# Patient Record
Sex: Male | Born: 1950 | Race: White | Hispanic: No | Marital: Single | Smoking: Former smoker
Health system: Southern US, Community
[De-identification: ages and names within clinical notes are randomized; demographics above are authoritative.]

## PROBLEM LIST (undated history)

## (undated) DIAGNOSIS — J90 Pleural effusion, not elsewhere classified: Secondary | ICD-10-CM

## (undated) DIAGNOSIS — I739 Peripheral vascular disease, unspecified: Secondary | ICD-10-CM

## (undated) DIAGNOSIS — M4802 Spinal stenosis, cervical region: Secondary | ICD-10-CM

## (undated) DIAGNOSIS — E1122 Type 2 diabetes mellitus with diabetic chronic kidney disease: Secondary | ICD-10-CM

## (undated) DIAGNOSIS — Z89519 Acquired absence of unspecified leg below knee: Secondary | ICD-10-CM

## (undated) DIAGNOSIS — I255 Ischemic cardiomyopathy: Secondary | ICD-10-CM

## (undated) DIAGNOSIS — I219 Acute myocardial infarction, unspecified: Secondary | ICD-10-CM

## (undated) DIAGNOSIS — I70209 Unspecified atherosclerosis of native arteries of extremities, unspecified extremity: Secondary | ICD-10-CM

## (undated) DIAGNOSIS — I2510 Atherosclerotic heart disease of native coronary artery without angina pectoris: Secondary | ICD-10-CM

## (undated) DIAGNOSIS — N183 Chronic kidney disease, stage 3 (moderate): Secondary | ICD-10-CM

## (undated) DIAGNOSIS — R011 Cardiac murmur, unspecified: Secondary | ICD-10-CM

## (undated) DIAGNOSIS — I509 Heart failure, unspecified: Secondary | ICD-10-CM

## (undated) DIAGNOSIS — C189 Malignant neoplasm of colon, unspecified: Secondary | ICD-10-CM

## (undated) DIAGNOSIS — E1142 Type 2 diabetes mellitus with diabetic polyneuropathy: Secondary | ICD-10-CM

## (undated) DIAGNOSIS — E1159 Type 2 diabetes mellitus with other circulatory complications: Secondary | ICD-10-CM

## (undated) DIAGNOSIS — Z85038 Personal history of other malignant neoplasm of large intestine: Secondary | ICD-10-CM

## (undated) DIAGNOSIS — I639 Cerebral infarction, unspecified: Secondary | ICD-10-CM

## (undated) DIAGNOSIS — N200 Calculus of kidney: Secondary | ICD-10-CM

## (undated) HISTORY — DX: Peripheral vascular disease, unspecified: I73.9

## (undated) HISTORY — DX: Calculus of kidney: N20.0

## (undated) HISTORY — PX: PORT-A-CATH REMOVAL: SHX5289

---

## 2003-09-02 ENCOUNTER — Emergency Department (HOSPITAL_COMMUNITY): Admission: EM | Admit: 2003-09-02 | Discharge: 2003-09-02 | Payer: Self-pay | Admitting: Emergency Medicine

## 2003-09-04 ENCOUNTER — Encounter (HOSPITAL_BASED_OUTPATIENT_CLINIC_OR_DEPARTMENT_OTHER): Admission: RE | Admit: 2003-09-04 | Discharge: 2003-10-22 | Payer: Self-pay | Admitting: Internal Medicine

## 2004-12-25 ENCOUNTER — Encounter (HOSPITAL_BASED_OUTPATIENT_CLINIC_OR_DEPARTMENT_OTHER): Admission: RE | Admit: 2004-12-25 | Discharge: 2005-03-25 | Payer: Self-pay | Admitting: Surgery

## 2008-06-27 ENCOUNTER — Inpatient Hospital Stay (HOSPITAL_COMMUNITY): Admission: EM | Admit: 2008-06-27 | Discharge: 2008-07-05 | Payer: Self-pay | Admitting: Emergency Medicine

## 2008-06-27 ENCOUNTER — Encounter (INDEPENDENT_AMBULATORY_CARE_PROVIDER_SITE_OTHER): Payer: Self-pay | Admitting: *Deleted

## 2008-06-27 ENCOUNTER — Encounter (INDEPENDENT_AMBULATORY_CARE_PROVIDER_SITE_OTHER): Payer: Self-pay | Admitting: General Surgery

## 2008-06-27 HISTORY — PX: SUBTOTAL COLECTOMY: SHX855

## 2008-07-03 ENCOUNTER — Ambulatory Visit: Payer: Self-pay | Admitting: Hematology and Oncology

## 2008-07-24 LAB — CBC WITH DIFFERENTIAL/PLATELET
BASO%: 0.8 % (ref 0.0–2.0)
LYMPH%: 13.9 % — ABNORMAL LOW (ref 14.0–48.0)
MCHC: 34.6 g/dL (ref 32.0–35.9)
MCV: 82.4 fL (ref 81.6–98.0)
MONO%: 7.9 % (ref 0.0–13.0)
Platelets: 265 10*3/uL (ref 145–400)
RBC: 4.98 10*6/uL (ref 4.20–5.71)

## 2008-07-24 LAB — COMPREHENSIVE METABOLIC PANEL
Albumin: 4.5 g/dL (ref 3.5–5.2)
Alkaline Phosphatase: 129 U/L — ABNORMAL HIGH (ref 39–117)
BUN: 37 mg/dL — ABNORMAL HIGH (ref 6–23)
CO2: 20 mEq/L (ref 19–32)
Calcium: 9.7 mg/dL (ref 8.4–10.5)
Glucose, Bld: 170 mg/dL — ABNORMAL HIGH (ref 70–99)
Potassium: 4.7 mEq/L (ref 3.5–5.3)
Sodium: 134 mEq/L — ABNORMAL LOW (ref 135–145)
Total Protein: 7.8 g/dL (ref 6.0–8.3)

## 2008-07-29 ENCOUNTER — Ambulatory Visit (HOSPITAL_COMMUNITY): Admission: RE | Admit: 2008-07-29 | Discharge: 2008-07-29 | Payer: Self-pay | Admitting: Hematology and Oncology

## 2008-07-29 ENCOUNTER — Encounter (INDEPENDENT_AMBULATORY_CARE_PROVIDER_SITE_OTHER): Payer: Self-pay | Admitting: *Deleted

## 2008-08-08 ENCOUNTER — Ambulatory Visit (HOSPITAL_BASED_OUTPATIENT_CLINIC_OR_DEPARTMENT_OTHER): Admission: RE | Admit: 2008-08-08 | Discharge: 2008-08-08 | Payer: Self-pay | Admitting: General Surgery

## 2008-08-08 ENCOUNTER — Encounter (INDEPENDENT_AMBULATORY_CARE_PROVIDER_SITE_OTHER): Payer: Self-pay | Admitting: *Deleted

## 2008-08-08 HISTORY — PX: PORTACATH PLACEMENT: SHX2246

## 2008-08-13 LAB — CBC WITH DIFFERENTIAL/PLATELET
BASO%: 0.3 % (ref 0.0–2.0)
LYMPH%: 18.7 % (ref 14.0–48.0)
MCHC: 34.5 g/dL (ref 32.0–35.9)
MONO#: 0.5 10*3/uL (ref 0.1–0.9)
MONO%: 4.4 % (ref 0.0–13.0)
Platelets: 282 10*3/uL (ref 145–400)
RBC: 4.64 10*6/uL (ref 4.20–5.71)
WBC: 11.1 10*3/uL — ABNORMAL HIGH (ref 4.0–10.0)

## 2008-08-13 LAB — COMPREHENSIVE METABOLIC PANEL
ALT: 28 U/L (ref 0–53)
Alkaline Phosphatase: 122 U/L — ABNORMAL HIGH (ref 39–117)
Sodium: 139 mEq/L (ref 135–145)
Total Bilirubin: 0.4 mg/dL (ref 0.3–1.2)
Total Protein: 7.3 g/dL (ref 6.0–8.3)

## 2008-08-23 ENCOUNTER — Ambulatory Visit: Payer: Self-pay | Admitting: Hematology and Oncology

## 2008-08-27 LAB — COMPREHENSIVE METABOLIC PANEL
ALT: 20 U/L (ref 0–53)
AST: 14 U/L (ref 0–37)
Alkaline Phosphatase: 167 U/L — ABNORMAL HIGH (ref 39–117)
Creatinine, Ser: 1.32 mg/dL (ref 0.40–1.50)
Sodium: 136 mEq/L (ref 135–145)
Total Bilirubin: 0.3 mg/dL (ref 0.3–1.2)
Total Protein: 7.5 g/dL (ref 6.0–8.3)

## 2008-08-27 LAB — CBC WITH DIFFERENTIAL/PLATELET
BASO%: 0.2 % (ref 0.0–2.0)
EOS%: 0.5 % (ref 0.0–7.0)
LYMPH%: 12.2 % — ABNORMAL LOW (ref 14.0–49.0)
MCH: 28.6 pg (ref 27.2–33.4)
MCHC: 34.5 g/dL (ref 32.0–36.0)
MCV: 83 fL (ref 79.3–98.0)
MONO#: 0.5 10*3/uL (ref 0.1–0.9)
MONO%: 3 % (ref 0.0–14.0)
NEUT%: 84.1 % — ABNORMAL HIGH (ref 39.0–75.0)
Platelets: 187 10*3/uL (ref 140–400)
RBC: 4.78 10*6/uL (ref 4.20–5.82)
WBC: 16.7 10*3/uL — ABNORMAL HIGH (ref 4.0–10.3)

## 2008-09-10 LAB — CBC WITH DIFFERENTIAL/PLATELET
BASO%: 0.4 % (ref 0.0–2.0)
Eosinophils Absolute: 0.1 10*3/uL (ref 0.0–0.5)
HCT: 38.7 % (ref 38.4–49.9)
LYMPH%: 24.4 % (ref 14.0–49.0)
MCHC: 34.5 g/dL (ref 32.0–36.0)
MCV: 82.7 fL (ref 79.3–98.0)
MONO#: 0.4 10*3/uL (ref 0.1–0.9)
MONO%: 3.9 % (ref 0.0–14.0)
NEUT%: 70.5 % (ref 39.0–75.0)
Platelets: 183 10*3/uL (ref 140–400)
RBC: 4.68 10*6/uL (ref 4.20–5.82)

## 2008-09-10 LAB — COMPREHENSIVE METABOLIC PANEL
Alkaline Phosphatase: 177 U/L — ABNORMAL HIGH (ref 39–117)
CO2: 23 mEq/L (ref 19–32)
Creatinine, Ser: 1.53 mg/dL — ABNORMAL HIGH (ref 0.40–1.50)
Glucose, Bld: 216 mg/dL — ABNORMAL HIGH (ref 70–99)
Sodium: 136 mEq/L (ref 135–145)
Total Bilirubin: 0.4 mg/dL (ref 0.3–1.2)
Total Protein: 7.4 g/dL (ref 6.0–8.3)

## 2008-09-24 LAB — COMPREHENSIVE METABOLIC PANEL
ALT: 14 U/L (ref 0–53)
Albumin: 3.8 g/dL (ref 3.5–5.2)
CO2: 20 mEq/L (ref 19–32)
Chloride: 102 mEq/L (ref 96–112)
Potassium: 4.4 mEq/L (ref 3.5–5.3)
Sodium: 135 mEq/L (ref 135–145)
Total Bilirubin: 0.3 mg/dL (ref 0.3–1.2)
Total Protein: 6.7 g/dL (ref 6.0–8.3)

## 2008-09-24 LAB — CBC WITH DIFFERENTIAL/PLATELET
BASO%: 0.6 % (ref 0.0–2.0)
LYMPH%: 24.6 % (ref 14.0–49.0)
MCHC: 34.6 g/dL (ref 32.0–36.0)
MONO#: 0.5 10*3/uL (ref 0.1–0.9)
RBC: 4.21 10*6/uL (ref 4.20–5.82)
RDW: 18.2 % — ABNORMAL HIGH (ref 11.0–14.6)
WBC: 7 10*3/uL (ref 4.0–10.3)
lymph#: 1.7 10*3/uL (ref 0.9–3.3)

## 2008-10-07 ENCOUNTER — Ambulatory Visit: Payer: Self-pay | Admitting: Hematology and Oncology

## 2008-10-09 LAB — CBC WITH DIFFERENTIAL/PLATELET
BASO%: 0.3 % (ref 0.0–2.0)
EOS%: 0.5 % (ref 0.0–7.0)
HCT: 31.8 % — ABNORMAL LOW (ref 38.4–49.9)
MCH: 29.3 pg (ref 27.2–33.4)
MCHC: 34 g/dL (ref 32.0–36.0)
MONO#: 0.8 10*3/uL (ref 0.1–0.9)
NEUT%: 80.9 % — ABNORMAL HIGH (ref 39.0–75.0)
RDW: 18.8 % — ABNORMAL HIGH (ref 11.0–14.6)
WBC: 11.5 10*3/uL — ABNORMAL HIGH (ref 4.0–10.3)
lymph#: 1.3 10*3/uL (ref 0.9–3.3)

## 2008-10-22 LAB — COMPREHENSIVE METABOLIC PANEL
ALT: 14 U/L (ref 0–53)
AST: 12 U/L (ref 0–37)
CO2: 25 mEq/L (ref 19–32)
Sodium: 138 mEq/L (ref 135–145)
Total Bilirubin: 0.4 mg/dL (ref 0.3–1.2)
Total Protein: 6.9 g/dL (ref 6.0–8.3)

## 2008-10-22 LAB — CBC WITH DIFFERENTIAL/PLATELET
BASO%: 0.5 % (ref 0.0–2.0)
EOS%: 2.5 % (ref 0.0–7.0)
LYMPH%: 13.6 % — ABNORMAL LOW (ref 14.0–49.0)
MCHC: 33.9 g/dL (ref 32.0–36.0)
MONO#: 0.7 10*3/uL (ref 0.1–0.9)
Platelets: 203 10*3/uL (ref 140–400)
RBC: 4.18 10*6/uL — ABNORMAL LOW (ref 4.20–5.82)
WBC: 10.9 10*3/uL — ABNORMAL HIGH (ref 4.0–10.3)
lymph#: 1.5 10*3/uL (ref 0.9–3.3)

## 2008-11-05 LAB — CBC WITH DIFFERENTIAL/PLATELET
Basophils Absolute: 0.1 10*3/uL (ref 0.0–0.1)
EOS%: 0.8 % (ref 0.0–7.0)
Eosinophils Absolute: 0.1 10*3/uL (ref 0.0–0.5)
HCT: 39.7 % (ref 38.4–49.9)
HGB: 13.6 g/dL (ref 13.0–17.1)
MCH: 30.8 pg (ref 27.2–33.4)
MONO#: 0.3 10*3/uL (ref 0.1–0.9)
NEUT%: 85.7 % — ABNORMAL HIGH (ref 39.0–75.0)
lymph#: 1.6 10*3/uL (ref 0.9–3.3)

## 2008-11-05 LAB — COMPREHENSIVE METABOLIC PANEL
BUN: 15 mg/dL (ref 6–23)
CO2: 23 mEq/L (ref 19–32)
Calcium: 9.6 mg/dL (ref 8.4–10.5)
Chloride: 103 mEq/L (ref 96–112)
Creatinine, Ser: 1.11 mg/dL (ref 0.40–1.50)
Glucose, Bld: 356 mg/dL — ABNORMAL HIGH (ref 70–99)

## 2008-11-19 LAB — CBC WITH DIFFERENTIAL/PLATELET
Basophils Absolute: 0 10*3/uL (ref 0.0–0.1)
HCT: 41.1 % (ref 38.4–49.9)
HGB: 14 g/dL (ref 13.0–17.1)
MONO#: 0.6 10*3/uL (ref 0.1–0.9)
NEUT#: 9.3 10*3/uL — ABNORMAL HIGH (ref 1.5–6.5)
NEUT%: 83.4 % — ABNORMAL HIGH (ref 39.0–75.0)
RDW: 17.5 % — ABNORMAL HIGH (ref 11.0–14.6)
WBC: 11.1 10*3/uL — ABNORMAL HIGH (ref 4.0–10.3)
lymph#: 1.2 10*3/uL (ref 0.9–3.3)

## 2008-11-19 LAB — COMPREHENSIVE METABOLIC PANEL
ALT: 20 U/L (ref 0–53)
Albumin: 4.2 g/dL (ref 3.5–5.2)
BUN: 15 mg/dL (ref 6–23)
CO2: 23 mEq/L (ref 19–32)
Calcium: 9.5 mg/dL (ref 8.4–10.5)
Chloride: 101 mEq/L (ref 96–112)
Creatinine, Ser: 1.18 mg/dL (ref 0.40–1.50)
Potassium: 4.2 mEq/L (ref 3.5–5.3)

## 2008-11-22 ENCOUNTER — Ambulatory Visit: Payer: Self-pay | Admitting: Hematology and Oncology

## 2008-12-03 LAB — CBC WITH DIFFERENTIAL/PLATELET
Basophils Absolute: 0.1 10*3/uL (ref 0.0–0.1)
EOS%: 0.2 % (ref 0.0–7.0)
HCT: 41.5 % (ref 38.4–49.9)
HGB: 14.6 g/dL (ref 13.0–17.1)
MCH: 32.5 pg (ref 27.2–33.4)
MCV: 92.4 fL (ref 79.3–98.0)
MONO%: 7.2 % (ref 0.0–14.0)
NEUT%: 79.3 % — ABNORMAL HIGH (ref 39.0–75.0)
RDW: 16 % — ABNORMAL HIGH (ref 11.0–14.6)

## 2008-12-03 LAB — COMPREHENSIVE METABOLIC PANEL
AST: 16 U/L (ref 0–37)
Alkaline Phosphatase: 200 U/L — ABNORMAL HIGH (ref 39–117)
BUN: 11 mg/dL (ref 6–23)
Creatinine, Ser: 1.26 mg/dL (ref 0.40–1.50)

## 2008-12-17 LAB — COMPREHENSIVE METABOLIC PANEL
AST: 17 U/L (ref 0–37)
BUN: 15 mg/dL (ref 6–23)
Calcium: 9.5 mg/dL (ref 8.4–10.5)
Chloride: 102 mEq/L (ref 96–112)
Creatinine, Ser: 1.29 mg/dL (ref 0.40–1.50)

## 2008-12-17 LAB — CBC WITH DIFFERENTIAL/PLATELET
Basophils Absolute: 0 10*3/uL (ref 0.0–0.1)
EOS%: 0.3 % (ref 0.0–7.0)
HCT: 41.7 % (ref 38.4–49.9)
HGB: 14.9 g/dL (ref 13.0–17.1)
MCH: 32.1 pg (ref 27.2–33.4)
MCV: 90.2 fL (ref 79.3–98.0)
MONO%: 9 % (ref 0.0–14.0)
NEUT%: 76.3 % — ABNORMAL HIGH (ref 39.0–75.0)
Platelets: 92 10*3/uL — ABNORMAL LOW (ref 140–400)
lymph#: 0.8 10*3/uL — ABNORMAL LOW (ref 0.9–3.3)

## 2008-12-25 ENCOUNTER — Ambulatory Visit: Payer: Self-pay | Admitting: Hematology and Oncology

## 2008-12-31 LAB — CBC WITH DIFFERENTIAL/PLATELET
BASO%: 0.4 % (ref 0.0–2.0)
Basophils Absolute: 0 10*3/uL (ref 0.0–0.1)
EOS%: 0.3 % (ref 0.0–7.0)
HGB: 13.4 g/dL (ref 13.0–17.1)
MCH: 31.8 pg (ref 27.2–33.4)
MCHC: 35.6 g/dL (ref 32.0–36.0)
MCV: 89.3 fL (ref 79.3–98.0)
MONO%: 7.4 % (ref 0.0–14.0)
NEUT%: 72.4 % (ref 39.0–75.0)
RDW: 15.3 % — ABNORMAL HIGH (ref 11.0–14.6)
lymph#: 1.1 10*3/uL (ref 0.9–3.3)

## 2008-12-31 LAB — COMPREHENSIVE METABOLIC PANEL
ALT: 19 U/L (ref 0–53)
AST: 18 U/L (ref 0–37)
Alkaline Phosphatase: 100 U/L (ref 39–117)
BUN: 13 mg/dL (ref 6–23)
Chloride: 102 mEq/L (ref 96–112)
Creatinine, Ser: 1.15 mg/dL (ref 0.40–1.50)
Potassium: 3.8 mEq/L (ref 3.5–5.3)

## 2009-01-14 ENCOUNTER — Encounter: Payer: Self-pay | Admitting: Gastroenterology

## 2009-01-14 LAB — COMPREHENSIVE METABOLIC PANEL
AST: 16 U/L (ref 0–37)
Alkaline Phosphatase: 102 U/L (ref 39–117)
BUN: 10 mg/dL (ref 6–23)
Calcium: 8.9 mg/dL (ref 8.4–10.5)
Chloride: 94 mEq/L — ABNORMAL LOW (ref 96–112)
Creatinine, Ser: 1.26 mg/dL (ref 0.40–1.50)
Total Bilirubin: 0.5 mg/dL (ref 0.3–1.2)

## 2009-01-14 LAB — CBC WITH DIFFERENTIAL/PLATELET
BASO%: 0.3 % (ref 0.0–2.0)
Basophils Absolute: 0 10*3/uL (ref 0.0–0.1)
EOS%: 0.9 % (ref 0.0–7.0)
HCT: 36.4 % — ABNORMAL LOW (ref 38.4–49.9)
HGB: 12.6 g/dL — ABNORMAL LOW (ref 13.0–17.1)
LYMPH%: 17 % (ref 14.0–49.0)
MCH: 31.5 pg (ref 27.2–33.4)
MCHC: 34.6 g/dL (ref 32.0–36.0)
MCV: 91 fL (ref 79.3–98.0)
MONO%: 12 % (ref 0.0–14.0)
NEUT%: 69.8 % (ref 39.0–75.0)

## 2009-01-17 ENCOUNTER — Ambulatory Visit: Payer: Self-pay | Admitting: Hematology and Oncology

## 2009-01-22 LAB — CBC WITH DIFFERENTIAL/PLATELET
BASO%: 1 % (ref 0.0–2.0)
Basophils Absolute: 0 10*3/uL (ref 0.0–0.1)
EOS%: 0.4 % (ref 0.0–7.0)
Eosinophils Absolute: 0 10*3/uL (ref 0.0–0.5)
HCT: 39.3 % (ref 38.4–49.9)
HGB: 13.6 g/dL (ref 13.0–17.1)
LYMPH%: 20.9 % (ref 14.0–49.0)
MCH: 32 pg (ref 27.2–33.4)
MCHC: 34.6 g/dL (ref 32.0–36.0)
MCV: 92.3 fL (ref 79.3–98.0)
MONO#: 0.6 10*3/uL (ref 0.1–0.9)
MONO%: 13 % (ref 0.0–14.0)
NEUT#: 3 10*3/uL (ref 1.5–6.5)
NEUT%: 64.7 % (ref 39.0–75.0)
Platelets: 119 10*3/uL — ABNORMAL LOW (ref 140–400)
RBC: 4.26 10*6/uL (ref 4.20–5.82)
RDW: 16.9 % — ABNORMAL HIGH (ref 11.0–14.6)
WBC: 4.6 10*3/uL (ref 4.0–10.3)
lymph#: 1 10*3/uL (ref 0.9–3.3)

## 2009-02-04 ENCOUNTER — Encounter: Payer: Self-pay | Admitting: Gastroenterology

## 2009-02-04 LAB — CBC WITH DIFFERENTIAL/PLATELET
Basophils Absolute: 0 10*3/uL (ref 0.0–0.1)
EOS%: 0.5 % (ref 0.0–7.0)
Eosinophils Absolute: 0 10*3/uL (ref 0.0–0.5)
HGB: 13.9 g/dL (ref 13.0–17.1)
MCH: 32.1 pg (ref 27.2–33.4)
NEUT#: 5.1 10*3/uL (ref 1.5–6.5)
RBC: 4.32 10*6/uL (ref 4.20–5.82)
RDW: 16.2 % — ABNORMAL HIGH (ref 11.0–14.6)
lymph#: 1.1 10*3/uL (ref 0.9–3.3)

## 2009-02-04 LAB — COMPREHENSIVE METABOLIC PANEL
ALT: 17 U/L (ref 0–53)
AST: 17 U/L (ref 0–37)
Albumin: 3.9 g/dL (ref 3.5–5.2)
BUN: 11 mg/dL (ref 6–23)
Calcium: 9.7 mg/dL (ref 8.4–10.5)
Chloride: 105 mEq/L (ref 96–112)
Potassium: 4.9 mEq/L (ref 3.5–5.3)
Sodium: 138 mEq/L (ref 135–145)
Total Protein: 6.8 g/dL (ref 6.0–8.3)

## 2009-02-13 ENCOUNTER — Ambulatory Visit: Payer: Self-pay | Admitting: Hematology and Oncology

## 2009-02-17 ENCOUNTER — Encounter: Payer: Self-pay | Admitting: Gastroenterology

## 2009-02-17 LAB — CBC WITH DIFFERENTIAL/PLATELET
BASO%: 0.3 % (ref 0.0–2.0)
EOS%: 0.5 % (ref 0.0–7.0)
HGB: 13.8 g/dL (ref 13.0–17.1)
MCH: 32.1 pg (ref 27.2–33.4)
MCHC: 34.6 g/dL (ref 32.0–36.0)
MCV: 93 fL (ref 79.3–98.0)
MONO%: 7.8 % (ref 0.0–14.0)
RBC: 4.3 10*6/uL (ref 4.20–5.82)
RDW: 16.3 % — ABNORMAL HIGH (ref 11.0–14.6)
lymph#: 1.1 10*3/uL (ref 0.9–3.3)

## 2009-02-17 LAB — COMPREHENSIVE METABOLIC PANEL
ALT: 21 U/L (ref 0–53)
AST: 20 U/L (ref 0–37)
Albumin: 3.8 g/dL (ref 3.5–5.2)
Alkaline Phosphatase: 134 U/L — ABNORMAL HIGH (ref 39–117)
Calcium: 9 mg/dL (ref 8.4–10.5)
Chloride: 99 mEq/L (ref 96–112)
Potassium: 4.2 mEq/L (ref 3.5–5.3)
Sodium: 131 mEq/L — ABNORMAL LOW (ref 135–145)

## 2009-03-14 DIAGNOSIS — E1159 Type 2 diabetes mellitus with other circulatory complications: Secondary | ICD-10-CM

## 2009-03-14 DIAGNOSIS — E1142 Type 2 diabetes mellitus with diabetic polyneuropathy: Secondary | ICD-10-CM

## 2009-03-14 DIAGNOSIS — C189 Malignant neoplasm of colon, unspecified: Secondary | ICD-10-CM

## 2009-03-14 HISTORY — DX: Type 2 diabetes mellitus with other circulatory complications: E11.59

## 2009-03-17 ENCOUNTER — Ambulatory Visit: Payer: Self-pay | Admitting: Gastroenterology

## 2009-03-18 ENCOUNTER — Ambulatory Visit: Payer: Self-pay | Admitting: Gastroenterology

## 2009-03-19 ENCOUNTER — Telehealth: Payer: Self-pay | Admitting: Gastroenterology

## 2009-05-06 ENCOUNTER — Ambulatory Visit: Payer: Self-pay | Admitting: Hematology and Oncology

## 2009-05-08 LAB — CBC WITH DIFFERENTIAL/PLATELET
Basophils Absolute: 0 10*3/uL (ref 0.0–0.1)
Eosinophils Absolute: 0.1 10*3/uL (ref 0.0–0.5)
HCT: 43.5 % (ref 38.4–49.9)
HGB: 14.9 g/dL (ref 13.0–17.1)
MONO#: 0.6 10*3/uL (ref 0.1–0.9)
NEUT#: 6.8 10*3/uL — ABNORMAL HIGH (ref 1.5–6.5)
NEUT%: 74.8 % (ref 39.0–75.0)
WBC: 9.1 10*3/uL (ref 4.0–10.3)
lymph#: 1.5 10*3/uL (ref 0.9–3.3)

## 2009-05-08 LAB — COMPREHENSIVE METABOLIC PANEL
Albumin: 4.7 g/dL (ref 3.5–5.2)
BUN: 15 mg/dL (ref 6–23)
CO2: 23 mEq/L (ref 19–32)
Calcium: 9.7 mg/dL (ref 8.4–10.5)
Chloride: 102 mEq/L (ref 96–112)
Creatinine, Ser: 1.43 mg/dL (ref 0.40–1.50)

## 2009-05-08 LAB — CEA: CEA: 2.4 ng/mL (ref 0.0–5.0)

## 2009-08-15 ENCOUNTER — Ambulatory Visit: Payer: Self-pay | Admitting: Hematology and Oncology

## 2009-08-19 ENCOUNTER — Ambulatory Visit (HOSPITAL_COMMUNITY): Admission: RE | Admit: 2009-08-19 | Discharge: 2009-08-19 | Payer: Self-pay | Admitting: Oncology

## 2009-08-19 LAB — COMPREHENSIVE METABOLIC PANEL
AST: 25 U/L (ref 0–37)
Albumin: 4.2 g/dL (ref 3.5–5.2)
BUN: 16 mg/dL (ref 6–23)
CO2: 28 mEq/L (ref 19–32)
Calcium: 9.2 mg/dL (ref 8.4–10.5)
Chloride: 102 mEq/L (ref 96–112)
Glucose, Bld: 295 mg/dL — ABNORMAL HIGH (ref 70–99)
Potassium: 4.3 mEq/L (ref 3.5–5.3)

## 2009-08-19 LAB — CBC WITH DIFFERENTIAL/PLATELET
Basophils Absolute: 0 10*3/uL (ref 0.0–0.1)
EOS%: 1.2 % (ref 0.0–7.0)
Eosinophils Absolute: 0.1 10*3/uL (ref 0.0–0.5)
HCT: 44 % (ref 38.4–49.9)
HGB: 15.2 g/dL (ref 13.0–17.1)
MONO#: 0.6 10*3/uL (ref 0.1–0.9)
NEUT#: 7.5 10*3/uL — ABNORMAL HIGH (ref 1.5–6.5)
RDW: 13.4 % (ref 11.0–14.6)
WBC: 9.7 10*3/uL (ref 4.0–10.3)
lymph#: 1.5 10*3/uL (ref 0.9–3.3)

## 2009-08-19 LAB — CEA: CEA: 2.8 ng/mL (ref 0.0–5.0)

## 2009-10-03 ENCOUNTER — Ambulatory Visit: Payer: Self-pay | Admitting: Hematology and Oncology

## 2009-10-31 ENCOUNTER — Encounter (INDEPENDENT_AMBULATORY_CARE_PROVIDER_SITE_OTHER): Payer: Self-pay | Admitting: General Surgery

## 2009-10-31 ENCOUNTER — Inpatient Hospital Stay (HOSPITAL_COMMUNITY): Admission: RE | Admit: 2009-10-31 | Discharge: 2009-11-10 | Payer: Self-pay | Admitting: General Surgery

## 2009-10-31 HISTORY — PX: ILEOSTOMY CLOSURE: SHX1784

## 2009-11-17 ENCOUNTER — Ambulatory Visit: Payer: Self-pay | Admitting: Hematology and Oncology

## 2009-12-02 ENCOUNTER — Telehealth (INDEPENDENT_AMBULATORY_CARE_PROVIDER_SITE_OTHER): Payer: Self-pay | Admitting: *Deleted

## 2009-12-11 LAB — CBC WITH DIFFERENTIAL/PLATELET
Basophils Absolute: 0.1 10*3/uL (ref 0.0–0.1)
HCT: 39.6 % (ref 38.4–49.9)
HGB: 13.2 g/dL (ref 13.0–17.1)
LYMPH%: 13.6 % — ABNORMAL LOW (ref 14.0–49.0)
MCH: 28.4 pg (ref 27.2–33.4)
MCHC: 33.3 g/dL (ref 32.0–36.0)
MONO#: 0.5 10*3/uL (ref 0.1–0.9)
NEUT%: 79.8 % — ABNORMAL HIGH (ref 39.0–75.0)
Platelets: 200 10*3/uL (ref 140–400)
WBC: 10.8 10*3/uL — ABNORMAL HIGH (ref 4.0–10.3)
lymph#: 1.5 10*3/uL (ref 0.9–3.3)

## 2009-12-11 LAB — COMPREHENSIVE METABOLIC PANEL
BUN: 15 mg/dL (ref 6–23)
CO2: 21 mEq/L (ref 19–32)
Calcium: 8.6 mg/dL (ref 8.4–10.5)
Chloride: 103 mEq/L (ref 96–112)
Creatinine, Ser: 1.2 mg/dL (ref 0.40–1.50)
Total Bilirubin: 0.5 mg/dL (ref 0.3–1.2)

## 2009-12-11 LAB — CEA: CEA: 2.7 ng/mL (ref 0.0–5.0)

## 2009-12-17 ENCOUNTER — Ambulatory Visit: Payer: Self-pay | Admitting: Hematology and Oncology

## 2010-02-05 ENCOUNTER — Ambulatory Visit: Payer: Self-pay | Admitting: Hematology and Oncology

## 2010-03-31 ENCOUNTER — Ambulatory Visit: Payer: Self-pay | Admitting: Hematology and Oncology

## 2010-04-14 ENCOUNTER — Ambulatory Visit (HOSPITAL_COMMUNITY): Admission: RE | Admit: 2010-04-14 | Discharge: 2010-04-14 | Payer: Self-pay | Admitting: Hematology and Oncology

## 2010-04-14 LAB — CBC WITH DIFFERENTIAL/PLATELET
BASO%: 0.4 % (ref 0.0–2.0)
EOS%: 1.1 % (ref 0.0–7.0)
HCT: 39.9 % (ref 38.4–49.9)
LYMPH%: 15.4 % (ref 14.0–49.0)
MCH: 29.9 pg (ref 27.2–33.4)
MCHC: 34.9 g/dL (ref 32.0–36.0)
MCV: 85.5 fL (ref 79.3–98.0)
MONO%: 5.8 % (ref 0.0–14.0)
NEUT%: 77.3 % — ABNORMAL HIGH (ref 39.0–75.0)
Platelets: 215 10*3/uL (ref 140–400)
RBC: 4.67 10*6/uL (ref 4.20–5.82)
WBC: 8.5 10*3/uL (ref 4.0–10.3)

## 2010-04-14 LAB — COMPREHENSIVE METABOLIC PANEL
ALT: 16 U/L (ref 0–53)
Alkaline Phosphatase: 95 U/L (ref 39–117)
CO2: 30 mEq/L (ref 19–32)
Creatinine, Ser: 1.41 mg/dL (ref 0.40–1.50)
Sodium: 139 mEq/L (ref 135–145)
Total Bilirubin: 0.8 mg/dL (ref 0.3–1.2)
Total Protein: 7.4 g/dL (ref 6.0–8.3)

## 2010-04-14 LAB — CEA: CEA: 3 ng/mL (ref 0.0–5.0)

## 2010-05-04 ENCOUNTER — Ambulatory Visit (HOSPITAL_BASED_OUTPATIENT_CLINIC_OR_DEPARTMENT_OTHER): Admission: RE | Admit: 2010-05-04 | Discharge: 2010-05-04 | Payer: Self-pay | Admitting: General Surgery

## 2010-07-19 ENCOUNTER — Encounter: Payer: Self-pay | Admitting: Oncology

## 2010-07-28 NOTE — Progress Notes (Signed)
  Phone Note Other Incoming   Request: Send information Summary of Call: Request for records received from [facility]. Request forwarded to [facility].     

## 2010-09-08 LAB — BASIC METABOLIC PANEL
Calcium: 9.2 mg/dL (ref 8.4–10.5)
GFR calc non Af Amer: 54 mL/min — ABNORMAL LOW (ref >60)
Glucose, Bld: 259 mg/dL — ABNORMAL HIGH (ref 70–99)
Sodium: 137 mEq/L (ref 135–145)

## 2010-09-08 LAB — GLUCOSE, CAPILLARY
Glucose-Capillary: 237 mg/dL — ABNORMAL HIGH (ref 70–99)
Glucose-Capillary: 260 mg/dL — ABNORMAL HIGH (ref 70–99)

## 2010-09-14 LAB — GLUCOSE, CAPILLARY
Glucose-Capillary: 113 mg/dL — ABNORMAL HIGH (ref 70–99)
Glucose-Capillary: 138 mg/dL — ABNORMAL HIGH (ref 70–99)
Glucose-Capillary: 147 mg/dL — ABNORMAL HIGH (ref 70–99)
Glucose-Capillary: 158 mg/dL — ABNORMAL HIGH (ref 70–99)

## 2010-09-15 LAB — BASIC METABOLIC PANEL
BUN: 4 mg/dL — ABNORMAL LOW (ref 6–23)
BUN: 7 mg/dL (ref 6–23)
BUN: 7 mg/dL (ref 6–23)
BUN: 7 mg/dL (ref 6–23)
CO2: 30 mEq/L (ref 19–32)
CO2: 30 mEq/L (ref 19–32)
Calcium: 7.5 mg/dL — ABNORMAL LOW (ref 8.4–10.5)
Calcium: 7.9 mg/dL — ABNORMAL LOW (ref 8.4–10.5)
Chloride: 101 mEq/L (ref 96–112)
Chloride: 102 mEq/L (ref 96–112)
Creatinine, Ser: 1.12 mg/dL (ref 0.4–1.5)
GFR calc non Af Amer: 56 mL/min — ABNORMAL LOW (ref >60)
GFR calc non Af Amer: 57 mL/min — ABNORMAL LOW (ref >60)
Glucose, Bld: 139 mg/dL — ABNORMAL HIGH (ref 70–99)
Glucose, Bld: 144 mg/dL — ABNORMAL HIGH (ref 70–99)
Glucose, Bld: 210 mg/dL — ABNORMAL HIGH (ref 70–99)
Potassium: 3.7 mEq/L (ref 3.5–5.1)
Potassium: 3.8 mEq/L (ref 3.5–5.1)
Sodium: 134 mEq/L — ABNORMAL LOW (ref 135–145)
Sodium: 135 mEq/L (ref 135–145)
Sodium: 138 mEq/L (ref 135–145)

## 2010-09-15 LAB — GLUCOSE, CAPILLARY
Glucose-Capillary: 118 mg/dL — ABNORMAL HIGH (ref 70–99)
Glucose-Capillary: 128 mg/dL — ABNORMAL HIGH (ref 70–99)
Glucose-Capillary: 129 mg/dL — ABNORMAL HIGH (ref 70–99)
Glucose-Capillary: 132 mg/dL — ABNORMAL HIGH (ref 70–99)
Glucose-Capillary: 132 mg/dL — ABNORMAL HIGH (ref 70–99)
Glucose-Capillary: 135 mg/dL — ABNORMAL HIGH (ref 70–99)
Glucose-Capillary: 137 mg/dL — ABNORMAL HIGH (ref 70–99)
Glucose-Capillary: 138 mg/dL — ABNORMAL HIGH (ref 70–99)
Glucose-Capillary: 138 mg/dL — ABNORMAL HIGH (ref 70–99)
Glucose-Capillary: 140 mg/dL — ABNORMAL HIGH (ref 70–99)
Glucose-Capillary: 140 mg/dL — ABNORMAL HIGH (ref 70–99)
Glucose-Capillary: 140 mg/dL — ABNORMAL HIGH (ref 70–99)
Glucose-Capillary: 142 mg/dL — ABNORMAL HIGH (ref 70–99)
Glucose-Capillary: 143 mg/dL — ABNORMAL HIGH (ref 70–99)
Glucose-Capillary: 143 mg/dL — ABNORMAL HIGH (ref 70–99)
Glucose-Capillary: 148 mg/dL — ABNORMAL HIGH (ref 70–99)
Glucose-Capillary: 148 mg/dL — ABNORMAL HIGH (ref 70–99)
Glucose-Capillary: 170 mg/dL — ABNORMAL HIGH (ref 70–99)
Glucose-Capillary: 174 mg/dL — ABNORMAL HIGH (ref 70–99)
Glucose-Capillary: 176 mg/dL — ABNORMAL HIGH (ref 70–99)
Glucose-Capillary: 184 mg/dL — ABNORMAL HIGH (ref 70–99)
Glucose-Capillary: 191 mg/dL — ABNORMAL HIGH (ref 70–99)
Glucose-Capillary: 191 mg/dL — ABNORMAL HIGH (ref 70–99)
Glucose-Capillary: 198 mg/dL — ABNORMAL HIGH (ref 70–99)
Glucose-Capillary: 201 mg/dL — ABNORMAL HIGH (ref 70–99)
Glucose-Capillary: 205 mg/dL — ABNORMAL HIGH (ref 70–99)
Glucose-Capillary: 205 mg/dL — ABNORMAL HIGH (ref 70–99)
Glucose-Capillary: 206 mg/dL — ABNORMAL HIGH (ref 70–99)
Glucose-Capillary: 206 mg/dL — ABNORMAL HIGH (ref 70–99)
Glucose-Capillary: 239 mg/dL — ABNORMAL HIGH (ref 70–99)
Glucose-Capillary: 243 mg/dL — ABNORMAL HIGH (ref 70–99)

## 2010-09-15 LAB — CBC
HCT: 30.1 % — ABNORMAL LOW (ref 39.0–52.0)
Hemoglobin: 10.2 g/dL — ABNORMAL LOW (ref 13.0–17.0)
Hemoglobin: 14.8 g/dL (ref 13.0–17.0)
Hemoglobin: 9.9 g/dL — ABNORMAL LOW (ref 13.0–17.0)
MCHC: 33.7 g/dL (ref 30.0–36.0)
MCHC: 33.7 g/dL (ref 30.0–36.0)
MCV: 90.5 fL (ref 78.0–100.0)
Platelets: 135 10*3/uL — ABNORMAL LOW (ref 150–400)
Platelets: 145 10*3/uL — ABNORMAL LOW (ref 150–400)
Platelets: 181 10*3/uL (ref 150–400)
RBC: 4.91 MIL/uL (ref 4.22–5.81)
RDW: 13.1 % (ref 11.5–15.5)
RDW: 13.1 % (ref 11.5–15.5)
WBC: 10.7 10*3/uL — ABNORMAL HIGH (ref 4.0–10.5)
WBC: 11.9 10*3/uL — ABNORMAL HIGH (ref 4.0–10.5)
WBC: 9.3 10*3/uL (ref 4.0–10.5)

## 2010-09-15 LAB — DIFFERENTIAL
Basophils Relative: 0 % (ref 0–1)
Eosinophils Absolute: 0.1 10*3/uL (ref 0.0–0.7)
Eosinophils Relative: 1 % (ref 0–5)
Lymphs Abs: 1.8 10*3/uL (ref 0.7–4.0)
Neutrophils Relative %: 73 % (ref 43–77)

## 2010-09-15 LAB — COMPREHENSIVE METABOLIC PANEL
ALT: 25 U/L (ref 0–53)
AST: 20 U/L (ref 0–37)
Alkaline Phosphatase: 89 U/L (ref 39–117)
CO2: 25 mEq/L (ref 19–32)
Calcium: 9.1 mg/dL (ref 8.4–10.5)
Chloride: 105 mEq/L (ref 96–112)
GFR calc Af Amer: >60 mL/min (ref >60)
GFR calc non Af Amer: 53 mL/min — ABNORMAL LOW (ref >60)
Glucose, Bld: 207 mg/dL — ABNORMAL HIGH (ref 70–99)
Potassium: 4.4 mEq/L (ref 3.5–5.1)
Sodium: 138 mEq/L (ref 135–145)
Total Bilirubin: 0.8 mg/dL (ref 0.3–1.2)

## 2010-09-15 LAB — PROTIME-INR
INR: 1.07 (ref 0.00–1.49)
Prothrombin Time: 13.8 seconds (ref 11.6–15.2)

## 2010-09-15 LAB — TYPE AND SCREEN

## 2010-10-12 LAB — BASIC METABOLIC PANEL
BUN: 11 mg/dL (ref 6–23)
BUN: 14 mg/dL (ref 6–23)
BUN: 9 mg/dL (ref 6–23)
CO2: 25 mEq/L (ref 19–32)
CO2: 32 mEq/L (ref 19–32)
Calcium: 7.2 mg/dL — ABNORMAL LOW (ref 8.4–10.5)
Calcium: 7.6 mg/dL — ABNORMAL LOW (ref 8.4–10.5)
Calcium: 7.7 mg/dL — ABNORMAL LOW (ref 8.4–10.5)
Chloride: 102 mEq/L (ref 96–112)
Chloride: 104 mEq/L (ref 96–112)
Creatinine, Ser: 1.06 mg/dL (ref 0.4–1.5)
Creatinine, Ser: 1.12 mg/dL (ref 0.4–1.5)
GFR calc Af Amer: >60 mL/min (ref >60)
GFR calc Af Amer: >60 mL/min (ref >60)
GFR calc non Af Amer: >60 mL/min (ref >60)
GFR calc non Af Amer: >60 mL/min (ref >60)
GFR calc non Af Amer: >60 mL/min (ref >60)
Glucose, Bld: 144 mg/dL — ABNORMAL HIGH (ref 70–99)
Glucose, Bld: 186 mg/dL — ABNORMAL HIGH (ref 70–99)
Potassium: 3.3 mEq/L — ABNORMAL LOW (ref 3.5–5.1)
Potassium: 4 mEq/L (ref 3.5–5.1)
Potassium: 4.4 mEq/L (ref 3.5–5.1)
Sodium: 134 mEq/L — ABNORMAL LOW (ref 135–145)
Sodium: 140 mEq/L (ref 135–145)

## 2010-10-12 LAB — GLUCOSE, CAPILLARY
Glucose-Capillary: 137 mg/dL — ABNORMAL HIGH (ref 70–99)
Glucose-Capillary: 140 mg/dL — ABNORMAL HIGH (ref 70–99)
Glucose-Capillary: 147 mg/dL — ABNORMAL HIGH (ref 70–99)
Glucose-Capillary: 149 mg/dL — ABNORMAL HIGH (ref 70–99)
Glucose-Capillary: 154 mg/dL — ABNORMAL HIGH (ref 70–99)
Glucose-Capillary: 156 mg/dL — ABNORMAL HIGH (ref 70–99)
Glucose-Capillary: 156 mg/dL — ABNORMAL HIGH (ref 70–99)
Glucose-Capillary: 157 mg/dL — ABNORMAL HIGH (ref 70–99)
Glucose-Capillary: 160 mg/dL — ABNORMAL HIGH (ref 70–99)
Glucose-Capillary: 162 mg/dL — ABNORMAL HIGH (ref 70–99)
Glucose-Capillary: 168 mg/dL — ABNORMAL HIGH (ref 70–99)
Glucose-Capillary: 168 mg/dL — ABNORMAL HIGH (ref 70–99)
Glucose-Capillary: 168 mg/dL — ABNORMAL HIGH (ref 70–99)
Glucose-Capillary: 170 mg/dL — ABNORMAL HIGH (ref 70–99)
Glucose-Capillary: 171 mg/dL — ABNORMAL HIGH (ref 70–99)
Glucose-Capillary: 172 mg/dL — ABNORMAL HIGH (ref 70–99)
Glucose-Capillary: 175 mg/dL — ABNORMAL HIGH (ref 70–99)
Glucose-Capillary: 175 mg/dL — ABNORMAL HIGH (ref 70–99)
Glucose-Capillary: 175 mg/dL — ABNORMAL HIGH (ref 70–99)
Glucose-Capillary: 178 mg/dL — ABNORMAL HIGH (ref 70–99)
Glucose-Capillary: 180 mg/dL — ABNORMAL HIGH (ref 70–99)
Glucose-Capillary: 185 mg/dL — ABNORMAL HIGH (ref 70–99)
Glucose-Capillary: 185 mg/dL — ABNORMAL HIGH (ref 70–99)
Glucose-Capillary: 188 mg/dL — ABNORMAL HIGH (ref 70–99)
Glucose-Capillary: 190 mg/dL — ABNORMAL HIGH (ref 70–99)
Glucose-Capillary: 191 mg/dL — ABNORMAL HIGH (ref 70–99)
Glucose-Capillary: 193 mg/dL — ABNORMAL HIGH (ref 70–99)
Glucose-Capillary: 194 mg/dL — ABNORMAL HIGH (ref 70–99)
Glucose-Capillary: 194 mg/dL — ABNORMAL HIGH (ref 70–99)
Glucose-Capillary: 195 mg/dL — ABNORMAL HIGH (ref 70–99)
Glucose-Capillary: 199 mg/dL — ABNORMAL HIGH (ref 70–99)
Glucose-Capillary: 217 mg/dL — ABNORMAL HIGH (ref 70–99)
Glucose-Capillary: 218 mg/dL — ABNORMAL HIGH (ref 70–99)
Glucose-Capillary: 220 mg/dL — ABNORMAL HIGH (ref 70–99)
Glucose-Capillary: 230 mg/dL — ABNORMAL HIGH (ref 70–99)

## 2010-10-12 LAB — CBC
HCT: 31.3 % — ABNORMAL LOW (ref 39.0–52.0)
HCT: 36.4 % — ABNORMAL LOW (ref 39.0–52.0)
Hemoglobin: 10.6 g/dL — ABNORMAL LOW (ref 13.0–17.0)
MCHC: 33.7 g/dL (ref 30.0–36.0)
MCV: 86.6 fL (ref 78.0–100.0)
Platelets: 203 10*3/uL (ref 150–400)
Platelets: 217 10*3/uL (ref 150–400)
RBC: 3.61 MIL/uL — ABNORMAL LOW (ref 4.22–5.81)
RDW: 12.4 % (ref 11.5–15.5)
RDW: 12.7 % (ref 11.5–15.5)
WBC: 10 10*3/uL (ref 4.0–10.5)
WBC: 18.9 10*3/uL — ABNORMAL HIGH (ref 4.0–10.5)

## 2010-10-12 LAB — HEMOGLOBIN A1C
Hgb A1c MFr Bld: 9.9 % — ABNORMAL HIGH (ref 4.6–6.1)
Mean Plasma Glucose: 237 mg/dL

## 2010-10-12 LAB — DIFFERENTIAL
Basophils Absolute: 0.1 10*3/uL (ref 0.0–0.1)
Basophils Relative: 1 % (ref 0–1)
Lymphocytes Relative: 8 % — ABNORMAL LOW (ref 12–46)
Neutro Abs: 7.9 10*3/uL — ABNORMAL HIGH (ref 1.7–7.7)
Neutrophils Relative %: 80 % — ABNORMAL HIGH (ref 43–77)

## 2010-10-13 LAB — BASIC METABOLIC PANEL
BUN: 22 mg/dL (ref 6–23)
Creatinine, Ser: 1.09 mg/dL (ref 0.4–1.5)
GFR calc non Af Amer: >60 mL/min (ref >60)
Glucose, Bld: 180 mg/dL — ABNORMAL HIGH (ref 70–99)
Potassium: 4.5 mEq/L (ref 3.5–5.1)

## 2010-10-13 LAB — GLUCOSE, CAPILLARY: Glucose-Capillary: 150 mg/dL — ABNORMAL HIGH (ref 70–99)

## 2010-11-02 IMAGING — CR DG CHEST 2V
2 series · 2 of 2 positions shown · non-contrast
Comparison: 06/27/2008

CLINICAL DATA: Diabetes.  Abdominal pain.

CHEST - 2 VIEW

[w chest pa]
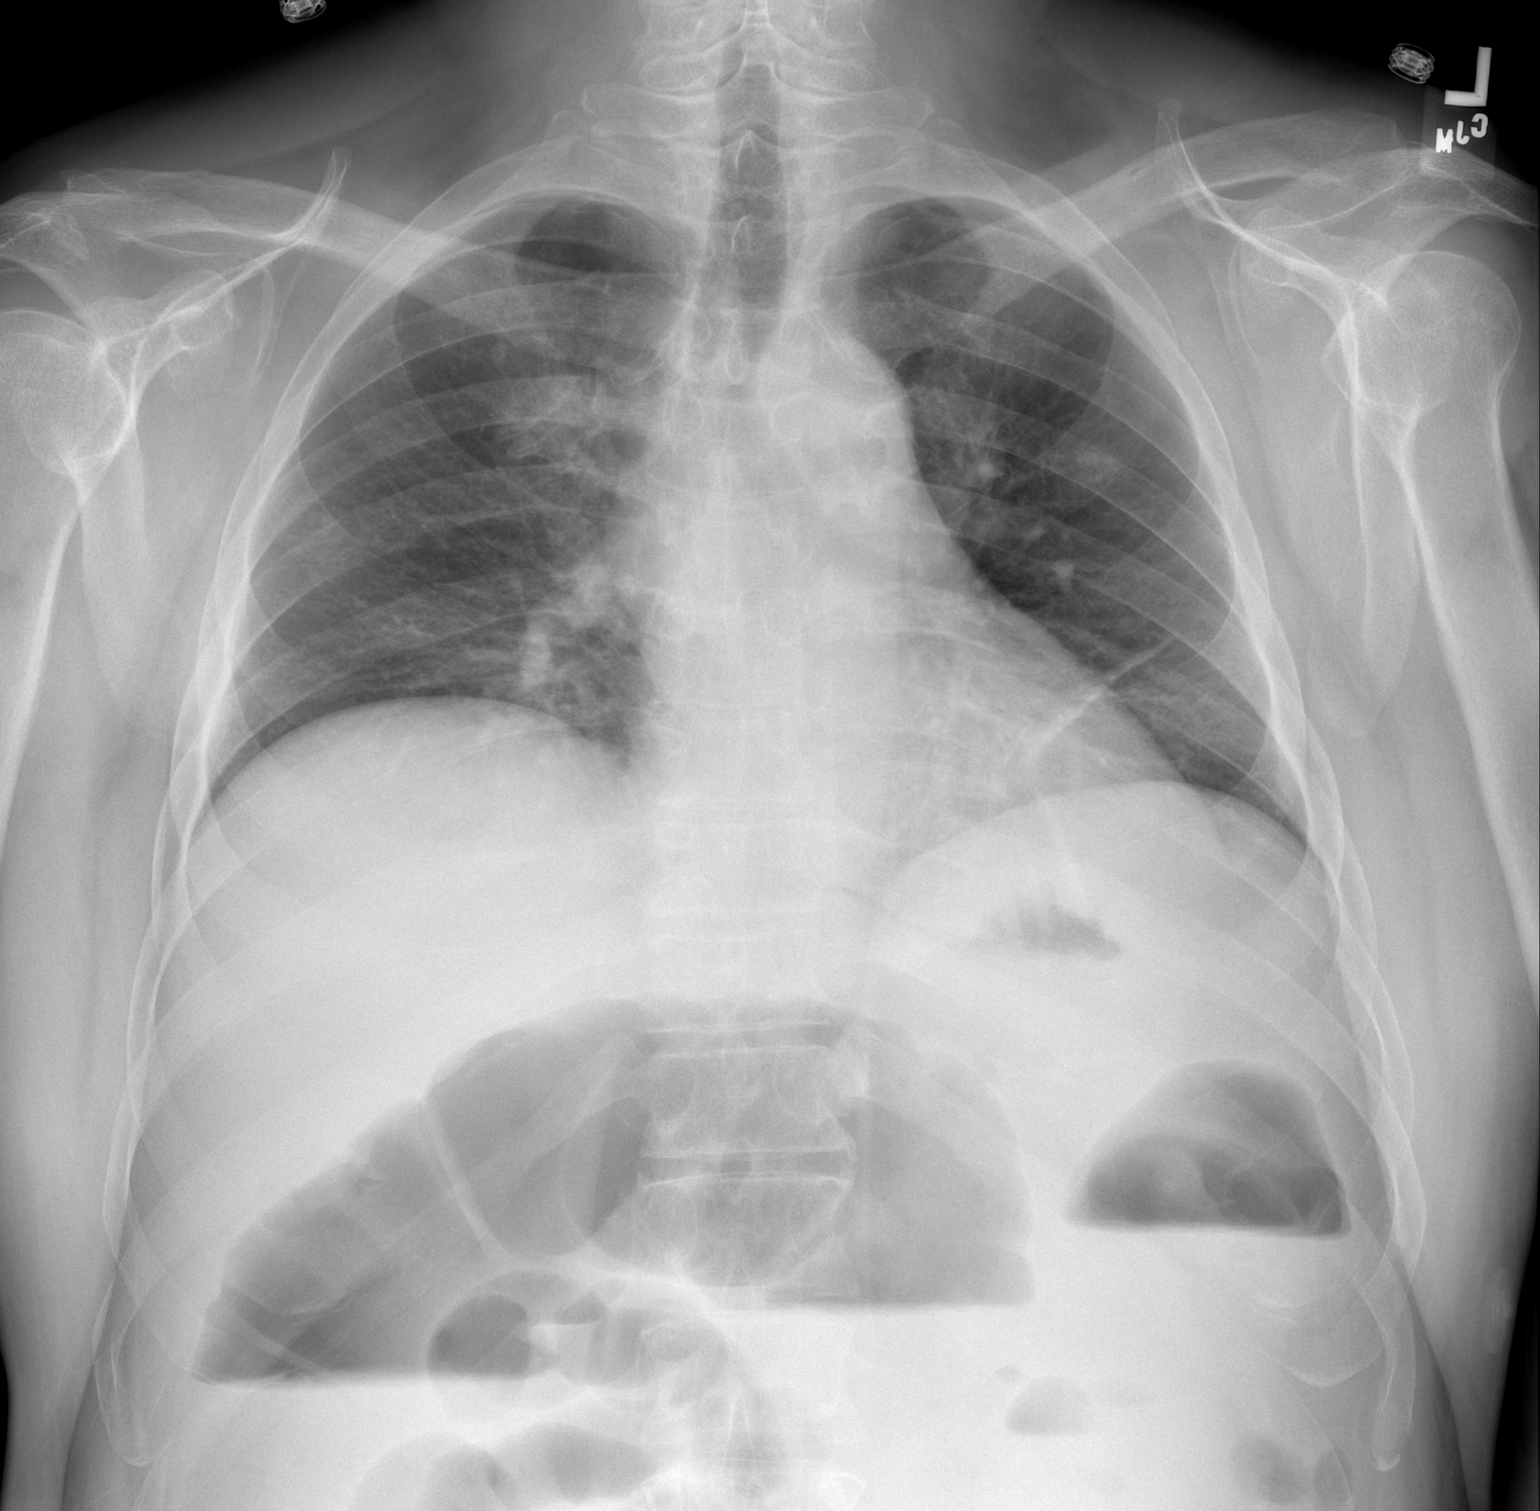

[w chest lat]
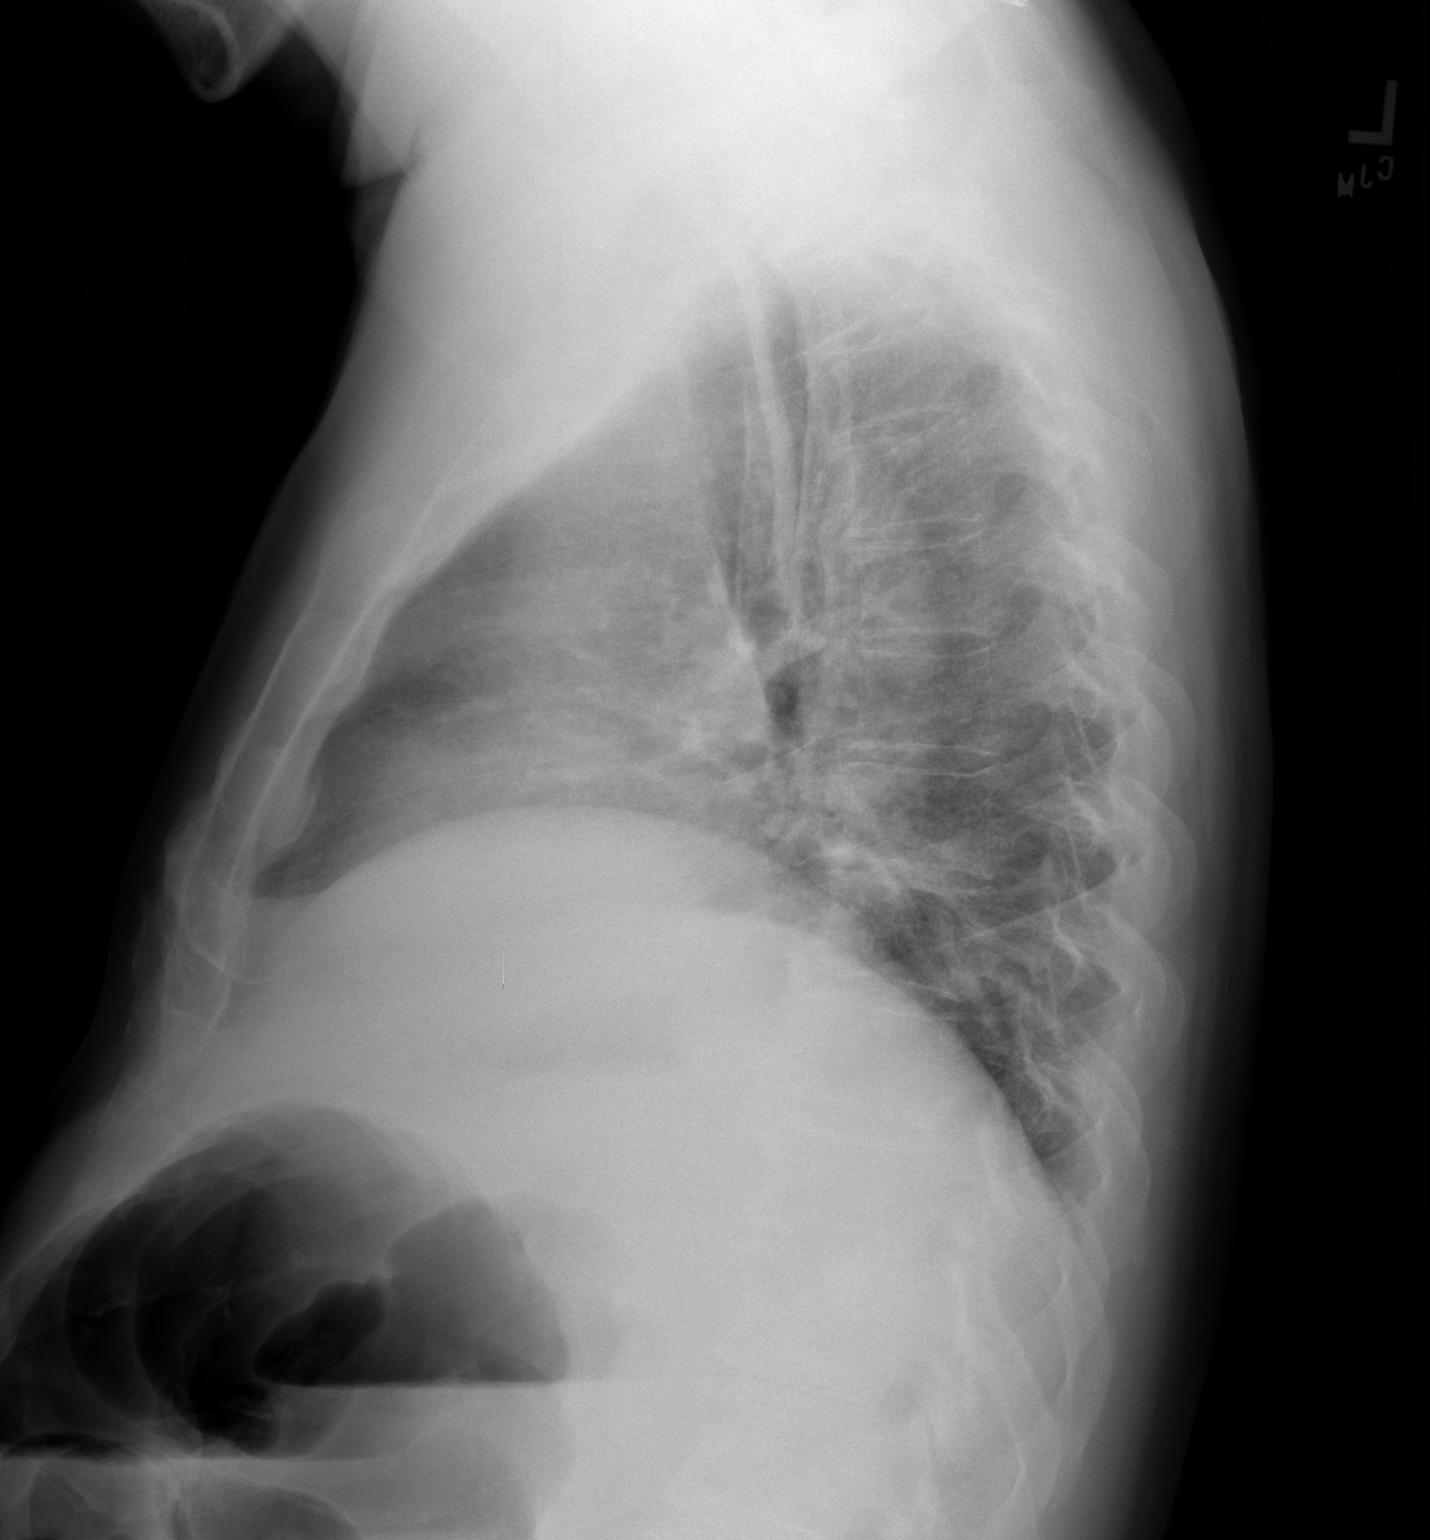

[2 of 2 positions shown; findings below may reference images not displayed]

FINDINGS: Air fluid levels are present in the colon and likely
within small bowel as well.

No free intraperitoneal gas is identified.  There is linear
subsegmental atelectasis at the left lung base.

Low lung volumes are present, causing crowding of the pulmonary
vasculature.

Faint nodularity projects over the upper lobes of the lungs and in
the right midlung.  The possibility of pulmonary nodules cannot be
readily excluded, and follow-up CT the chest is recommended.
IMPRESSION: 1.  Nodularity in the lungs bilaterally such that a true pulmonary
nodules cannot be excluded.  CT of the chest is recommended for
further workup.
2.  Mild left basilar atelectasis.
3.  Air fluid levels in the colon and small bowel compatible with
findings on today's CT of the abdomen.

## 2010-11-10 NOTE — Discharge Summary (Signed)
NAME:  Tyler Erickson, Tyler Erickson NO.:  [id number]   MEDICAL RECORD NO.:  [id number]          PATIENT TYPE:  INP   LOCATION:  5154                         FACILITY:  [facility]   PHYSICIAN:  Tyler Sportsman, MD     DATE OF BIRTH:  Aug 22, 1950   DATE OF ADMISSION:  06/27/2008  DATE OF DISCHARGE:  07/05/2008                               DISCHARGE SUMMARY   DISCHARGING PHYSICIAN:  Tyler Sportsman, MD   PRIMARY CARE PHYSICIAN:  None.   CHIEF COMPLAINT/REASON FOR ADMISSION:  Tyler Erickson is a 60 year old male  patient who prior to presentation had been having abdominal pain, right  flank occasionally on the left side for 1 week prior to presentation.  The pain has progressively worsened associated with abdominal  distention.  Initially, it had a waxing and waning quality and pattern,  but now more permanent, very severe on the date of admission.  He had a  small bowel movement and a small amount of flatus, but because of pain  he presented to the ER.  Initial workup because of the flank pain was  concerning for possible kidney stone, but CT urogram demonstrated no  renal calculi, but was found to have a large bowel obstruction and what  appeared to be a mass lesion in the splenic flexure concerning for  neoplasm.  He also had some pneumatosis of the right colon and free  fluid.  Surgical consultation was requested.  On exam, the patient's  abdomen was firm and distended.  Mild diffuse tenderness present with  hypoactive bowel sounds and masses.  No hernias.  The patient's serum  lactate was elevated at 2.7.  White count was 11,200 with a left shift,  hemoglobin 15.  Glucose 267, potassium 3.2, albumin 3.6.  CT again  revealed a large dilatation beginning just proximal to the splenic  flexure with depressed colon distally with a potential mass lesion  there, very large.  The patient was admitted with the following  diagnoses:  1. Splenic flexure obstructing colon mass, possible  neoplasm.  2. Lactic acidosis in the setting of pneumatosis indicative of      progressive ischemia.  3. Leukocytosis and volume depletion associated with hypokalemia.  4. Hyperglycemia in patient with history of diabetes, unable to take      medications secondary to financial issues.   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  This was an open  procedure.  The patient was sent back to the general surgical floor to  recover.   In the postoperative period, the patient had an NG tube in place for  bowel rest, IV fluids, PCA narcotics.  He also received cefoxitin 1 g IV  q.6 h. for 3 doses for empiric prophylaxis due to enteric pathogens.   By postop day #3, the patient was having flatus and watery stool and  some mixed sedimented stool in the ileostomy bag.  His abdomen was soft,  but  slightly distended.  Bowel sounds were present.  Staple line was  clean, dry, and intact.  It was opted at that time to cram the patient's  NG and if tolerated begin sips of clears.  In addition, Toradol was  added to the PCA morphine to improve pain management.   Through the remainder of hospitalization, the patient progressed very  well.  His diet was advanced with ileostomy and diabetic precautions.  Initially we thought the patient was having acceptable ileostomy output,  usually less than 500 mL were documented on the nursing record once the  patient was up and moving about, but prior to discharge, it was  discovered the patient had been emptying his ileostomy bag and not  reporting the output to the nurses.  It was noted at this time that his  ileostomy output was greater than a liter per 24 hour period, therefore  the patient was started on Imodium b.i.d. prior to discharge.   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   Wound margins were clear.  1 of 27 lymph nodes positive with tumor code  of T3, N1, NX.  The patient did have leukocytosis preoperatively and  this had resolved postoperatively with most recent white count 10,000  with a hemoglobin of 10.6.  This was noted on July 02, 2008.  Because  of the patient's diabetes, hemoglobin A1c was checked, and the patient  was placed on sliding scale insulin.  During the hospitalization,  hemoglobin A1c was 9.9.  In discussing with the patient, the patient had  been on Amaryl and metformin up until June of this year he had been  followed by Tyler Erickson but because of loss of insurance, the  patient was unable to continue medication and has not taken any diabetes  medicines since that time.  He does have a Glucometer and I discussed  with the patient if he needed additional diabetes education and he said  no.  He he felt comfortable with treatment of diabetes.  He just could  not afford take his medications if he knew the risks.   In addition, the patient did receive wound ostomy care teaching.  Per  our wound ostomy care nursing team, he demonstrated competence and  emptying the bag, changing the pouch, and burping the bag.  Home health  RN will also follow the patient regarding ostomy care and help with  ordering and obtaining appliances as needed and assist the patient with  management of diabetes.   Because of patient's financial status, case management was asked to help  the patient establish with [facility], so his diabetes could be managed  on a regular basis.  The patient has been given this number and will  follow up with them in an outpatient setting.  In addition, he received  nutrition education regarding diet appropriate to ileostomy patient.  I  also went on the Internet and printed up multiple documents regarding  ileostomy pouch care and appropriate ileostomy diet.  In addition, the  patient has been given information on his  discharge paper work when to  call a doctor regarding too much or too little ileostomy output and how  to replace his ileostomy output based on his mL/mL ileostomy output,  i.e. taken fluids to equal the amount of ileostomy output.   On date of discharge, the patient's T-max was 99.5, BP was 116/70, pulse  87 and regular, respirations 18.  He was sating 96% on room air.  In the  past 24 hours, his glucose has ranged between 168-194.  We had just  started him on metformin 500 b.i.d. as his only medication.  His abdomen  is soft.  Bowel sounds were tender.  His ileostomy was pink and the  postoperative edema was slowly resolving.  He was having liquid, watery,  and sedimented stool and staples were clean, dry, and intact and plans  were to remove the staples and apply Steri-Strips prior to discharge.   FINAL DISCHARGE DIAGNOSES:  1. Obstructing splenic flexure mass secondary to adenocarcinoma,      pathology also reports 1 of 27 lymph nodes positive.  2. Status post exploratory laparotomy with subtotal colectomy and      ileostomy.  3. Diabetes, uncontrolled prior to admission due to financial issues.   DISCHARGE MEDICATIONS:  1. Metformin 500 mg b.i.d., this is a Engineer, maintenance (IT) prescription.  2. Imodium 1 tablet b.i.d., we are using this for increased ileostomy      output.  3. Percocet 5/325, 1-2 tablets every 4 hours as needed for pain.  4. Over-the-counter ibuprofen as directed on the bottle for pain as      well.  Always take this with food or snack.   DIET:  Carb-modified post ileostomy diet.   WOUND CARE:  Routine ileostomy care.  Home health RN to assist.  Allow  Steri-Strips to fall off.   ACTIVITY:  Increase activity slowly.  May walk up steps.  May shower.  No lifting more than 15 pounds for 5 weeks.  No driving for 2 weeks.   ADDITIONAL INSTRUCTIONS:  1. You are to check your glucose via the Glucometer 4 times a day      before meals and at bedtime, occasionally  check your glucose 2      hours after meals as well for at least 1 month and record.  Bring      all these glucose readings to your physician visit with the      [facility].  2. Call surgeon if ileostomy output is greater than 1000 mL per day or      less than 200 mL per day.  3. While having liquid-type output from ileostomy measure that volume      and replace that volume with water, juice, Gatorade, etc, from      getting dehydrated and that is going to be the exact volume.   FOLLOWUP APPOINTMENTS:  1. You are to call [facility] to establish, so you can have a primary      care physician who will also follow your diabetes.  2. You have an appointment with Dr. Dalene Carrow with Oncology at the      [facility] on Wednesday, July 24, 2008, at 9:30 a.m.  3. You need to call Dr. Maris Berger office to be seen in 2 weeks.      Allison L. Eliott Nine, MD  Electronically Signed    ALE/MEDQ  D:  07/05/2008  T:  07/05/2008  Job:  [id number]   cc:   Adolph Pollack, M.D.  Lauretta I. Odogwu, M.D.  [facility]

## 2010-11-10 NOTE — H&P (Signed)
NAME:  Tyler Erickson, Tyler Erickson NO.:  [id number]   MEDICAL RECORD NO.:  [id number]          PATIENT TYPE:  INP   LOCATION:  5154                         FACILITY:  [facility]   PHYSICIAN:  Adolph Pollack, M.D.DATE OF BIRTH:  06/08/1951   DATE OF ADMISSION:  06/27/2008  DATE OF DISCHARGE:                              HISTORY & PHYSICAL   CHIEF COMPLAINTS:  Abdominal pain and distention.   HISTORY:  This is a 60 year old male who about a week ago, began having  some pain in his abdominal region, right flank, sometimes left side.  It  occurred over Christmas, has progressively worsened, and he has had  abdominal distention that has waxed and waned but now become permanent.  The pain got severe today.  He had a small bowel movement this morning,  passed a little gas, but it let him come to the emergency department.  He underwent a CT urogram and no kidney stones seen but what he had was  a large bowel obstruction with what appeared to be a mass lesion at the  splenic flexure concerning for a neoplasm.  He also had some pneumatosis  of the right colon and free fluid.  At this point, I was asked to see  him.  He says he has been diffusely tender, no fever.  He has had some  weight loss, although he cannot quantitate it.  Family member told he  has lost a fair amount of weight over the past couple of months.   PAST MEDICAL HISTORY:  1. Diabetes mellitus.  2. Nephrolithiasis.   PREVIOUS OPERATIONS:  None.   ALLERGIES:  None.   MEDICATIONS:  None (the patient states that he lost his job and his  insurance and since then, he has not been back to his endocrinologist or  taken any of his regular medications).   SOCIAL HISTORY:  He is single, lives with his brother.  He does smoke.  No alcohol use.  Recently, unemployed.   His family history is notable for diabetes mellitus, heart disease, and  coronary artery disease but no cancer.   REVIEW OF SYSTEMS:  Somewhat as above.   CARDIOVASCULAR:  No known heart  disease or hypertension.  PULMONARY:  No asthma, pneumonia, or  emphysema.  GASTROINTESTINAL:  No peptic ulcers, hepatitis, melena, or  hematochezia.  GENITOURINARY:  He does have kidney stones.  No prostate  disease.  NEUROLOGIC:  No strokes or seizures.  HEMATOLOGIC:  No  bleeding disorders or blood clots.   PHYSICAL EXAMINATION:  GENERAL:  He is a slightly uncomfortable male,  but he is very pleasant and cooperative.  VITAL SIGNS:  Temperature is 97.9, pulse 101, blood pressure is 129/86,  respiratory rate 16.  HEENT:  He wears glasses.  Extraocular movements intact.  No icterus.  NECK:  Supple without any masses.  RESPIRATORY:  Breath sounds are equal and clear, respirations unlabored.  CARDIOVASCULAR:  Slightly increased rate with a regular rhythm.  ABDOMEN:  Firm and distended.  Mild diffuse tenderness present with  hypoactive bowel sounds.  No masses.  No hernias.  MUSCULOSKELETAL:  Good range of motion and muscle tone.  SKIN:  No jaundice.  NEUROLOGIC:  Alert and oriented.  Answers questions appropriately.   LABORATORY DATA:  Lactate is slightly elevated at 2.7.  White count  11,200 with left shift, hemoglobin 15.0.  Electrolytes are notable for a  potassium of 3.2, glucose 267, and albumin of 3.6.  CT was reviewed  demonstrating the large dilatation beginning at just proximal to the  splenic flexure with decompressed colon distally.  Appears to be  potential mass lesion there.  Also, he has some pneumatosis in the right  colon and free fluid in the right lower quadrant.   IMPRESSION:  Large bowel obstruction, most likely secondary to a  neoplasm.  Also has slight lactic acidosis and pneumatosis, indicative  of a progressing ischemia.   PLAN:  To the operating room for urgent subtotal colectomy, likely  ileostomy.  I have gone over the procedure, rationale, and risks  extensively with him and his family.  Risks include but are not limited   to bleeding, infection, wound healing problems, anesthesia, ileostomy  problems, accidental injury to intra-abdominal organs (such as,  intestine, kidney, liver, spleen, ureter, etc).  He seems to understand  all this and agrees with the plan and understands the urgency of this  issue.      Adolph Pollack, M.D.  Electronically Signed     TJR/MEDQ  D:  06/27/2008  T:  06/28/2008  Job:  [id number]

## 2010-11-10 NOTE — Op Note (Signed)
NAME:  Tyler Erickson, GIPE NO.:  [id number]   MEDICAL RECORD NO.:  [id number]          PATIENT TYPE:  INP   LOCATION:  5154                         FACILITY:  [facility]   PHYSICIAN:  Adolph Pollack, M.D.DATE OF BIRTH:  1951/04/30   DATE OF PROCEDURE:  06/27/2008  DATE OF DISCHARGE:                               OPERATIVE REPORT   PREOPERATIVE DIAGNOSIS:  Colonic obstruction secondary to neoplasm.   POSTOPERATIVE DIAGNOSIS:  Colonic obstruction secondary to neoplasm.   PROCEDURE:  Subtotal colectomy with ileostomy.   SURGEON:  Adolph Pollack, MD   ASSISTANT:  Thornton Park. Daphine Deutscher, MD   ANESTHESIA:  General.   INDICATIONS:  This 60 year old male presented to the emergency  department with abdominal pain and distention.  He thought he was having  a kidney stone.  He had been having problems like this for about of a  week.  He subsequently underwent a CT urogram.  This did not demonstrate  a kidney stone, but an obstructing lesion at the splenic flexure in left  colon with significant dilatation of the transverse in right colon, and  pneumatosis changes as well as free fluid in the right colon area.  He  also had an elevation of his lactate a slight elevation of white count,  and was tender, and is now brought to the operating room for urgent  colectomy.   TECHNIQUE:  He was brought to the operating room and placed supine on  the operating table and general anesthetic was administered.  Foley  catheter placed in the bladder and the hair on the abdominal wall was  clipped.  The abdominal wall was sterilely prepped and draped.  A long  midline incision was made dividing all layers and entering the  peritoneal cavity where straw-colored fluid was evacuated.  The colon  was significantly dilated and the right colon had serosal tears and  areas of evolving ischemia.  I began by mobilizing some of the right  colon and terminal ileum dividing the lateral attachments  close to the  colon and above the plane of the ureter.  I then approached the left  colon and palpated firm constricting mass.  The splenic flexure.  I  mobilized descending colon down to the descending colon, sigmoid colon  junction.  I then mobilized the splenic flexure using electrocautery and  a LigaSure device.  No splenic injury was noted.  I dissected omentum  free from the transverse colon mobilizing it and mobilized hepatic  flexure as well.  I then divided the descending colon at the descending  colon, sigmoid colon junction with a linear cutting stapler and divided  the terminal ileum near the cecum with a linear cutting stapler.   The mesentery of the colon was divided with a LigaSure device.  After  this was performed, the specimen was handed off the field.  Constricting  lesion could be seen at the splenic flexure.  I did not see any obvious  liver lesions or any obvious implants in the pelvis or in the peritoneal  wall.   Following this, I irrigated out  the abdominal cavity and found some  bleeding points, which were controlled with electrocautery.  I irrigated  this wound up to 3 L of normal saline solution.  Further inspection  demonstrated adequate hemostasis.   I then made a circular incision in the skin in the right lower quadrant  and a cruciate incision in the layers of fascia.  I dilated this to 2  fingers and brought the distal ileal stump up through this area.  I  anchored some of the ileum to the posterior abdominal wall with  interrupted 3-0 Vicryl sutures.  I then placed the omentum over the  small bowel.  I tacked the colon up to the abdominal wall with 2-0  Prolene sutures left long and left lower quadrant.  I requested needle,  sponge, and instrument count.   The fascia was closed with looped double-stranded PDS suture, and at  this point, needle, sponge, and instrument counts were reported to be  correct.  I then irrigated out the subcutaneous tissue  and closed the  skin with staples.   I then saw a little bit of the terminal ileal stump that was a little  ischemic and I resected this with a GIA stapler.  I then removed the  staple line and matured the ileostomy in a Broke-type fashion using  interrupted 3-0 Vicryl sutures.  I then placed a stomal appliance and a  sterile dressing was placed on the midline wound.   He has tolerated the procedure well without any apparent complications  and was taken to the recovery room in satisfactory condition.      Adolph Pollack, M.D.  Electronically Signed     TJR/MEDQ  D:  06/28/2008  T:  06/28/2008  Job:  [id number]

## 2010-11-10 NOTE — Op Note (Signed)
NAMEREMUS, HAGEDORN NO.:  [id number]   MEDICAL RECORD NO.:  [id number]          PATIENT TYPE:  AMB   LOCATION:  DSC                          FACILITY:  [facility]   PHYSICIAN:  Adolph Pollack, M.D.DATE OF BIRTH:  January 19, 1951   DATE OF PROCEDURE:  08/08/2008  DATE OF DISCHARGE:                               OPERATIVE REPORT   PREOPERATIVE DIAGNOSIS:  Colon cancer.   POSTOPERATIVE DIAGNOSIS:  Colon cancer.   PROCEDURE:  Insertion of single-lumen Port-A-Cath at the left subclavian  vein under fluoroscopic guidance.   SURGEON:  Adolph Pollack, MD   ANESTHESIA:  General plus local.   INDICATIONS:  This 60 year old male has colon cancer and needs long-term  venous access for chemotherapy and presents for that.   TECHNIQUE:  He is brought to the operating room, placed supine on the  operating table, and given general anesthetic by way of LMA.  The hair  in the upper chest and neck was shaved and area sterilely prepped and  draped.  He was placed in Trendelenburg position.  Local anesthetic  (lidocaine) was injected in the left infraclavicular region.  Using a 16-  gauge needle, the left subclavian vein was cannulated and a wire passed  into this left subclavian vein into the superior vena cava.  This was  verified by fluoroscopy.  After this was done, the needle was removed.   Inferior to the wire site, I injected local anesthetic in the left chest  wall and made an incision using through skin and subcutaneous tissue and  then used electrocautery to create a pocket for the Port-A-Cath.  I made  an incision around the wire as well.  I then passed the catheter from  the inferior up through the superior incision.   The vein was then dilated and then the dilator introducer complex was  introduced into the left subclavian vein.  The wire and dilator were  removed and the catheter was threaded through the peel-away sheath  introducer.  The peel-away sheath  introducer was then removed.   Under fluoroscopic guidance, I pulled the catheter back until the tip  was at the distal superior vena cava, right atrial junction.  I then cut  the catheter and attached it to the port.  The port withdrew blood and  flushed easily.  The port was then anchored to the chest wall with 2-0  Vicryl sutures.  Concentrated heparin was placed in the port.  The port  and catheter were then visualized under fluoroscopic guidance in a good  position.   Following this, I closed the subcutaneous tissue over the port with a  running 2-0 Vicryl suture.  The skin of the port site and of the vein  cannulation site was then closed with 4-0 Monocryl subcuticular  stitches.  Steri-Strips and sterile dressings were applied.  He  tolerated the procedure well without any apparent complications.  He was  taken to the recovery room in satisfactory condition where a portable  chest x-ray is pending.      Adolph Pollack, M.D.  Electronically Signed  TJR/MEDQ  D:  08/08/2008  T:  08/09/2008  Job:  [id number]

## 2010-11-13 NOTE — Consult Note (Signed)
NAME:  Tyler Erickson, Tyler Erickson                          ACCOUNT NO.:  [id number]   MEDICAL RECORD NO.:  [id number]                   PATIENT TYPE:  REC   LOCATION:  FOOT                                 FACILITY:  [facility]   PHYSICIAN:  Jonelle Sports. Sevier, M.D.              DATE OF BIRTH:  1950-11-18   DATE OF CONSULTATION:  09/05/2003  DATE OF DISCHARGE:                                   CONSULTATION   HISTORY:  This 60 year old white male is referred through the courtesy of  Dr. Talmage Nap for assistance with management of foot problems.   The patient has had type 2 diabetes diagnosed since 2002.  He apparently has  had very little attention to the disease and is not well controlled.  Recent  hemoglobin A1C 9.1%.   The patient works at a job which keeps him on his feet continuously and has  had previous difficulty with tendency toward significant callus formation  underlying the first and fifth metatarsal head areas bilaterally.  This has  been particularly marked under the fifth metatarsal head on the left where  he has had a tendency toward hemorrhagic change and actually drainage from  the area from time to time.  His most recent episode of such drainage and  spreading inflammation was approximately 10 days ago at which point he was  seen by Dr. Talmage Nap and placed on cephalexin with good response.  Since that  time, he notices that any drainage has ceased.  He is here today for our  further evaluation, advice, and treatment.   PAST MEDICAL HISTORY:  In addition to his diabetes, the patient has had the  tendency for kidney stones and, in fact, passed one several days ago.   ALLERGIES:  No known medicinal allergies.   REGULAR MEDICATIONS:  Include Avandamet, Lipitor, Lisinopril, daily aspirin,  and the recent course of cephalexin as previously mention.   PHYSICAL EXAMINATION:  Examination today is limited to the lower  extremities.  The patient's feet are without gross deformity, and skin  temperatures are essentially normal and symmetrical.  There is no edema.  All pulses are palpable and quite adequate.  Monofilament testing shows that  he has protective sensation throughout both feet.   He has significant callus formation underlying the first and fifth  metatarsal head areas on the right, and on that side, it is noted that there  is a bit of fall in first metatarsal head.   On the plantar aspect of the left foot are again significant calluses  underlying the first and fifth metatarsal heads with some hemorrhagic change  and a tiny fissure in the midst of the area underlying the fifth metatarsal  head.  There is no surrounding erythema at the moment.   DISPOSITION:  1. The patient is given instruction regarding foot care and diabetes by     video with nurse and physician reinforcement.  Specifically, it  is     reinforced that he needs to begin to take better care of his diabetes, to     monitor blood sugars, and to pay more attention to diet.  It is     recommended to him that he consider diabetes classes which he apparently     has never taken.  2. The calluses in the four locations previously mentioned are all sharply     pared without particular incident.  3. The hemorrhagic callus with tiny fissure at the fifth metatarsal head     area on the left is extensively pared, and there is no evidence for     underlying infection or ulceration at this point.  4. The area of the fifth metatarsal head on the left is dressed briefly with     Neosporin and an Allevyn pad.  The patient is to be allowed to remove     this in two or three days.  5. The patient is given prescription and urged to go today to obtain custom     molded inserts for his shoes with instructions also to the orthotist to     assess the patient's shoes for adequacy of width and depth.  6. Followup visit will be here in one month to assess his new footwear and     the adequacy thereof and also to attempt  further to reduce the calluses.                                               Jonelle Sports. Cheryll Cockayne, M.D.    RES/MEDQ  D:  09/05/2003  T:  09/06/2003  Job:  [id number]   cc:   Dorisann Frames, M.D.  [facility] [place] [address]  Fax: [phone]

## 2011-04-02 LAB — URINALYSIS, ROUTINE W REFLEX MICROSCOPIC
Leukocytes, UA: NEGATIVE
Nitrite: NEGATIVE
Protein, ur: 30 mg/dL — AB
Specific Gravity, Urine: 1.044 — ABNORMAL HIGH (ref 1.005–1.030)
Urobilinogen, UA: 1 mg/dL (ref 0.0–1.0)

## 2011-04-02 LAB — URINE MICROSCOPIC-ADD ON

## 2011-04-02 LAB — POCT I-STAT GLUCOSE
Glucose, Bld: 214 mg/dL — ABNORMAL HIGH (ref 70–99)
Operator id: 114421

## 2011-04-02 LAB — POCT I-STAT, CHEM 8
Calcium, Ion: 1.02 mmol/L — ABNORMAL LOW (ref 1.12–1.32)
Creatinine, Ser: 1.2 mg/dL (ref 0.4–1.5)
Glucose, Bld: 267 mg/dL — ABNORMAL HIGH (ref 70–99)
Hemoglobin: 16.3 g/dL (ref 13.0–17.0)
Potassium: 3.2 mEq/L — ABNORMAL LOW (ref 3.5–5.1)
TCO2: 31 mmol/L (ref 0–100)

## 2011-04-02 LAB — COMPREHENSIVE METABOLIC PANEL
Alkaline Phosphatase: 96 U/L (ref 39–117)
BUN: 16 mg/dL (ref 6–23)
Creatinine, Ser: 1.01 mg/dL (ref 0.4–1.5)
Glucose, Bld: 256 mg/dL — ABNORMAL HIGH (ref 70–99)
Potassium: 3.7 mEq/L (ref 3.5–5.1)
Total Protein: 7 g/dL (ref 6.0–8.3)

## 2011-04-02 LAB — DIFFERENTIAL
Basophils Relative: 0 % (ref 0–1)
Eosinophils Relative: 1 % (ref 0–5)
Monocytes Absolute: 0.3 10*3/uL (ref 0.1–1.0)
Monocytes Relative: 3 % (ref 3–12)
Neutro Abs: 9.9 10*3/uL — ABNORMAL HIGH (ref 1.7–7.7)

## 2011-04-02 LAB — CBC
HCT: 47.3 % (ref 39.0–52.0)
Hemoglobin: 15.8 g/dL (ref 13.0–17.0)
MCHC: 33.3 g/dL (ref 30.0–36.0)
RBC: 5.43 MIL/uL (ref 4.22–5.81)
RDW: 12.6 % (ref 11.5–15.5)

## 2011-04-02 LAB — LACTIC ACID, PLASMA: Lactic Acid, Venous: 2.7 mmol/L — ABNORMAL HIGH (ref 0.5–2.2)

## 2011-12-10 ENCOUNTER — Encounter: Payer: Self-pay | Admitting: *Deleted

## 2011-12-10 ENCOUNTER — Encounter: Payer: Self-pay | Admitting: Cardiology

## 2012-03-17 ENCOUNTER — Encounter: Payer: Self-pay | Admitting: Gastroenterology

## 2012-06-28 DIAGNOSIS — I639 Cerebral infarction, unspecified: Secondary | ICD-10-CM

## 2012-06-28 DIAGNOSIS — J90 Pleural effusion, not elsewhere classified: Secondary | ICD-10-CM

## 2012-06-28 HISTORY — DX: Cerebral infarction, unspecified: I63.9

## 2012-06-28 HISTORY — DX: Pleural effusion, not elsewhere classified: J90

## 2012-06-28 HISTORY — PX: THORACENTESIS: SHX235

## 2012-06-29 ENCOUNTER — Inpatient Hospital Stay (HOSPITAL_COMMUNITY)
Admission: EM | Admit: 2012-06-29 | Discharge: 2012-07-11 | DRG: 853 | Disposition: A | Discharge status: Skilled Nursing Facility | Payer: Medicare HMO | Attending: Internal Medicine | Admitting: Internal Medicine

## 2012-06-29 ENCOUNTER — Emergency Department (HOSPITAL_COMMUNITY): Payer: Medicare HMO

## 2012-06-29 ENCOUNTER — Encounter (HOSPITAL_COMMUNITY): Payer: Self-pay | Admitting: Emergency Medicine

## 2012-06-29 DIAGNOSIS — E872 Acidosis, unspecified: Secondary | ICD-10-CM | POA: Diagnosis present

## 2012-06-29 DIAGNOSIS — R6520 Severe sepsis without septic shock: Secondary | ICD-10-CM | POA: Diagnosis present

## 2012-06-29 DIAGNOSIS — R092 Respiratory arrest: Secondary | ICD-10-CM | POA: Diagnosis present

## 2012-06-29 DIAGNOSIS — A419 Sepsis, unspecified organism: Principal | ICD-10-CM | POA: Diagnosis present

## 2012-06-29 DIAGNOSIS — L97509 Non-pressure chronic ulcer of other part of unspecified foot with unspecified severity: Secondary | ICD-10-CM

## 2012-06-29 DIAGNOSIS — N179 Acute kidney failure, unspecified: Secondary | ICD-10-CM | POA: Diagnosis present

## 2012-06-29 DIAGNOSIS — G589 Mononeuropathy, unspecified: Secondary | ICD-10-CM

## 2012-06-29 DIAGNOSIS — R197 Diarrhea, unspecified: Secondary | ICD-10-CM | POA: Diagnosis present

## 2012-06-29 DIAGNOSIS — M25619 Stiffness of unspecified shoulder, not elsewhere classified: Secondary | ICD-10-CM | POA: Diagnosis present

## 2012-06-29 DIAGNOSIS — E118 Type 2 diabetes mellitus with unspecified complications: Secondary | ICD-10-CM

## 2012-06-29 DIAGNOSIS — E43 Unspecified severe protein-calorie malnutrition: Secondary | ICD-10-CM

## 2012-06-29 DIAGNOSIS — I6340 Cerebral infarction due to embolism of unspecified cerebral artery: Secondary | ICD-10-CM | POA: Diagnosis present

## 2012-06-29 DIAGNOSIS — E119 Type 2 diabetes mellitus without complications: Secondary | ICD-10-CM

## 2012-06-29 DIAGNOSIS — R739 Hyperglycemia, unspecified: Secondary | ICD-10-CM

## 2012-06-29 DIAGNOSIS — E871 Hypo-osmolality and hyponatremia: Secondary | ICD-10-CM | POA: Diagnosis present

## 2012-06-29 DIAGNOSIS — C189 Malignant neoplasm of colon, unspecified: Secondary | ICD-10-CM | POA: Diagnosis present

## 2012-06-29 DIAGNOSIS — M4802 Spinal stenosis, cervical region: Secondary | ICD-10-CM | POA: Diagnosis present

## 2012-06-29 DIAGNOSIS — G9340 Encephalopathy, unspecified: Secondary | ICD-10-CM | POA: Diagnosis present

## 2012-06-29 DIAGNOSIS — E1159 Type 2 diabetes mellitus with other circulatory complications: Secondary | ICD-10-CM | POA: Diagnosis present

## 2012-06-29 DIAGNOSIS — I70269 Atherosclerosis of native arteries of extremities with gangrene, unspecified extremity: Secondary | ICD-10-CM | POA: Diagnosis present

## 2012-06-29 DIAGNOSIS — N189 Chronic kidney disease, unspecified: Secondary | ICD-10-CM | POA: Diagnosis present

## 2012-06-29 DIAGNOSIS — N182 Chronic kidney disease, stage 2 (mild): Secondary | ICD-10-CM | POA: Diagnosis present

## 2012-06-29 DIAGNOSIS — R6521 Severe sepsis with septic shock: Secondary | ICD-10-CM | POA: Diagnosis present

## 2012-06-29 DIAGNOSIS — J9600 Acute respiratory failure, unspecified whether with hypoxia or hypercapnia: Secondary | ICD-10-CM | POA: Diagnosis present

## 2012-06-29 DIAGNOSIS — Z85038 Personal history of other malignant neoplasm of large intestine: Secondary | ICD-10-CM

## 2012-06-29 DIAGNOSIS — I96 Gangrene, not elsewhere classified: Secondary | ICD-10-CM | POA: Diagnosis present

## 2012-06-29 DIAGNOSIS — G8190 Hemiplegia, unspecified affecting unspecified side: Secondary | ICD-10-CM | POA: Diagnosis present

## 2012-06-29 DIAGNOSIS — F17200 Nicotine dependence, unspecified, uncomplicated: Secondary | ICD-10-CM | POA: Diagnosis present

## 2012-06-29 DIAGNOSIS — E860 Dehydration: Secondary | ICD-10-CM

## 2012-06-29 DIAGNOSIS — E876 Hypokalemia: Secondary | ICD-10-CM | POA: Diagnosis present

## 2012-06-29 DIAGNOSIS — L97809 Non-pressure chronic ulcer of other part of unspecified lower leg with unspecified severity: Secondary | ICD-10-CM | POA: Diagnosis present

## 2012-06-29 DIAGNOSIS — E1142 Type 2 diabetes mellitus with diabetic polyneuropathy: Secondary | ICD-10-CM | POA: Diagnosis present

## 2012-06-29 DIAGNOSIS — E11621 Type 2 diabetes mellitus with foot ulcer: Secondary | ICD-10-CM | POA: Diagnosis present

## 2012-06-29 DIAGNOSIS — D72829 Elevated white blood cell count, unspecified: Secondary | ICD-10-CM | POA: Diagnosis present

## 2012-06-29 LAB — CBC
Hemoglobin: 13.4 g/dL (ref 13.0–17.0)
MCH: 29 pg (ref 26.0–34.0)
MCV: 80.3 fL (ref 78.0–100.0)
Platelets: 169 10*3/uL (ref 150–400)
RBC: 4.62 MIL/uL (ref 4.22–5.81)

## 2012-06-29 LAB — BASIC METABOLIC PANEL
CO2: 18 mEq/L — ABNORMAL LOW (ref 19–32)
Calcium: 8 mg/dL — ABNORMAL LOW (ref 8.4–10.5)
Creatinine, Ser: 1.94 mg/dL — ABNORMAL HIGH (ref 0.50–1.35)
Glucose, Bld: 300 mg/dL — ABNORMAL HIGH (ref 70–99)

## 2012-06-29 LAB — GLUCOSE, CAPILLARY
Glucose-Capillary: 300 mg/dL — ABNORMAL HIGH (ref 70–99)
Glucose-Capillary: 306 mg/dL — ABNORMAL HIGH (ref 70–99)
Glucose-Capillary: 204 mg/dL — ABNORMAL HIGH (ref 70–99)
Glucose-Capillary: 198 mg/dL — ABNORMAL HIGH (ref 70–99)
Glucose-Capillary: 124 mg/dL — ABNORMAL HIGH (ref 70–99)

## 2012-06-29 LAB — HEPATIC FUNCTION PANEL
ALT: 10 U/L (ref 0–53)
AST: 34 U/L (ref 0–37)
Albumin: 1.9 g/dL — ABNORMAL LOW (ref 3.5–5.2)
Bilirubin, Direct: 0.7 mg/dL — ABNORMAL HIGH (ref 0.0–0.3)
Total Bilirubin: 0.8 mg/dL (ref 0.3–1.2)

## 2012-06-29 LAB — URINALYSIS, ROUTINE W REFLEX MICROSCOPIC
Nitrite: NEGATIVE
Specific Gravity, Urine: 1.026 (ref 1.005–1.030)
pH: 5 (ref 5.0–8.0)

## 2012-06-29 LAB — MAGNESIUM: Magnesium: 2.6 mg/dL — ABNORMAL HIGH (ref 1.5–2.5)

## 2012-06-29 MED ORDER — SODIUM CHLORIDE 0.9 % IV BOLUS (SEPSIS): 500.0000 mL | Freq: Once | INTRAVENOUS | Status: DC

## 2012-06-29 MED ORDER — VANCOMYCIN HCL IN DEXTROSE 1-5 GM/200ML-% IV SOLN
1000.0000 mg | Freq: Once | INTRAVENOUS | Status: AC
  Administered 2012-06-29: 1000 mg via INTRAVENOUS
  Filled 2012-06-29: qty 200

## 2012-06-29 MED ORDER — CLINDAMYCIN PHOSPHATE 600 MG/50ML IV SOLN
600.0000 mg | Freq: Four times a day (QID) | INTRAVENOUS | Status: DC
  Administered 2012-06-29 – 2012-07-03 (×14): 600 mg via INTRAVENOUS
  Filled 2012-06-29 (×21): qty 50

## 2012-06-29 MED ORDER — SODIUM CHLORIDE 0.9 % IJ SOLN
3.0000 mL | Freq: Two times a day (BID) | INTRAMUSCULAR | Status: DC
  Administered 2012-06-29 – 2012-07-07 (×10): 3 mL via INTRAVENOUS

## 2012-06-29 MED ORDER — SODIUM CHLORIDE 0.9 % IV BOLUS (SEPSIS)
1000.0000 mL | Freq: Once | INTRAVENOUS | Status: AC
  Administered 2012-06-29: 1000 mL via INTRAVENOUS

## 2012-06-29 MED ORDER — INSULIN REGULAR BOLUS VIA INFUSION
0.0000 [IU] | Freq: Three times a day (TID) | INTRAVENOUS | Status: DC
Start: 2012-06-29 — End: 2012-06-30
  Filled 2012-06-29: qty 10

## 2012-06-29 MED ORDER — SODIUM CHLORIDE 0.9 % IV SOLN: INTRAVENOUS | Status: DC

## 2012-06-29 MED ORDER — PIPERACILLIN-TAZOBACTAM 3.375 G IVPB 30 MIN
3.3750 g | Freq: Once | INTRAVENOUS | Status: AC
  Administered 2012-06-29: 3.375 g via INTRAVENOUS
  Filled 2012-06-29: qty 50

## 2012-06-29 MED ORDER — POTASSIUM CHLORIDE IN NACL 40-0.9 MEQ/L-% IV SOLN
INTRAVENOUS | Status: DC
  Administered 2012-06-29: 20:00:00 via INTRAVENOUS
  Administered 2012-06-30: 125 mL/h via INTRAVENOUS
  Filled 2012-06-29 (×5): qty 1000

## 2012-06-29 MED ORDER — KETOROLAC TROMETHAMINE 30 MG/ML IJ SOLN: 30.0000 mg | Freq: Once | INTRAMUSCULAR | Status: DC

## 2012-06-29 MED ORDER — ACETAMINOPHEN 650 MG RE SUPP: 650.0000 mg | Freq: Four times a day (QID) | RECTAL | Status: DC | PRN

## 2012-06-29 MED ORDER — ONDANSETRON HCL 4 MG PO TABS: 4.0000 mg | ORAL_TABLET | Freq: Four times a day (QID) | ORAL | Status: DC | PRN

## 2012-06-29 MED ORDER — SODIUM CHLORIDE 0.9 % IV BOLUS (SEPSIS)
500.0000 mL | Freq: Once | INTRAVENOUS | Status: AC
  Administered 2012-06-29: 500 mL via INTRAVENOUS

## 2012-06-29 MED ORDER — SODIUM CHLORIDE 0.9 % IV BOLUS (SEPSIS)
1000.0000 mL | Freq: Once | INTRAVENOUS | Status: AC
  Administered 2012-06-30: 1000 mL via INTRAVENOUS

## 2012-06-29 MED ORDER — SODIUM CHLORIDE 0.9 % IV SOLN
500.0000 mg | Freq: Two times a day (BID) | INTRAVENOUS | Status: DC
  Administered 2012-06-30 – 2012-07-01 (×3): 500 mg via INTRAVENOUS
  Filled 2012-06-29 (×4): qty 500

## 2012-06-29 MED ORDER — PIPERACILLIN-TAZOBACTAM 3.375 G IVPB
3.3750 g | Freq: Three times a day (TID) | INTRAVENOUS | Status: DC
  Administered 2012-06-29 – 2012-07-03 (×10): 3.375 g via INTRAVENOUS
  Filled 2012-06-29 (×16): qty 50

## 2012-06-29 MED ORDER — ONDANSETRON HCL 4 MG/2ML IJ SOLN: 4.0000 mg | Freq: Four times a day (QID) | INTRAMUSCULAR | Status: DC | PRN

## 2012-06-29 MED ORDER — ACETAMINOPHEN 325 MG PO TABS: 650.0000 mg | ORAL_TABLET | Freq: Four times a day (QID) | ORAL | Status: DC | PRN

## 2012-06-29 MED ORDER — SODIUM CHLORIDE 0.9 % IV BOLUS (SEPSIS): 1000.0000 mL | Freq: Once | INTRAVENOUS | Status: DC

## 2012-06-29 MED ORDER — DEXTROSE-NACL 5-0.45 % IV SOLN: INTRAVENOUS | Status: DC

## 2012-06-29 MED ORDER — HYDROMORPHONE HCL PF 1 MG/ML IJ SOLN: 0.5000 mg | INTRAMUSCULAR | Status: DC | PRN

## 2012-06-29 MED ORDER — HYDROCODONE-ACETAMINOPHEN 5-325 MG PO TABS
2.0000 | ORAL_TABLET | Freq: Four times a day (QID) | ORAL | Status: DC | PRN
  Administered 2012-06-29: 2 via ORAL
  Filled 2012-06-29 (×2): qty 1

## 2012-06-29 MED ORDER — SODIUM CHLORIDE 0.9 % IV BOLUS (SEPSIS)
1000.0000 mL | Freq: Once | INTRAVENOUS | Status: AC
  Administered 2012-06-29 (×2): 1000 mL via INTRAVENOUS

## 2012-06-29 MED ORDER — SODIUM CHLORIDE 0.9 % IV SOLN
INTRAVENOUS | Status: DC
  Administered 2012-06-29: 4.7 [IU]/h via INTRAVENOUS
  Administered 2012-06-29: 5.8 [IU]/h via INTRAVENOUS
  Administered 2012-06-29: 2.5 [IU]/h via INTRAVENOUS
  Filled 2012-06-29: qty 1

## 2012-06-29 MED ORDER — PIPERACILLIN-TAZOBACTAM 4.5 G IVPB: 4.5000 g | Freq: Once | INTRAVENOUS | Status: DC

## 2012-06-29 NOTE — ED Notes (Signed)
Pt in 2012 had colon ca and has surgery. Since then he has been dealing with diabetes and elevated CBG. Pt has not been taking metformin and has had pain and numbness to lt foot. Lt foot has bleeding and pale areas to foot area. Pt has it wrapped in cloth. Pt has not been able to eat or drink lately and has been feeling sick. Pt also fell a few days ago maybe fri and hit rt arm and has not been able to move his rt arm since, does have some swelling to rt fa area. Pt feels warm to touch. EMS cbg 274. Pt is alert x4.

## 2012-06-29 NOTE — ED Provider Notes (Signed)
History     CSN: [id number]  Arrival date & time 06/29/12  1318   First MD Initiated Contact with Patient 06/29/12 1345      Chief Complaint  Patient presents with  . Weakness  . Foot Pain    (Consider location/radiation/quality/duration/timing/severity/associated sxs/prior treatment) HPI A LEVEL 5 CAVEAT PERTAINS DUE TO POOR HISTORIAN AND ALTERED MENTAL STATUS Pt presenting with generalized fatigue and weakness as well as elevated blood sugar.  He also c/o pain in his right arm- he states he was moving furniture and hit his arm causing pain several days ago.  He also c/o pain and bleeding from his left foot- he states this started approx 1 week ago.   He is not able to contribute much more to the history.  Although states he has not been taking his diabetes meds for over 1 year due to financial constraints.  His family member states he did not want to come to the hospital until today.   Past Medical History  Diagnosis Date  . ADENOCARCINOMA, COLON     S/P surgery/chemotherapy  . DM   . NEUROPATHY   . Kidney stones   . Large bowel obstruction   . Ischemic colon     Past Surgical History  Procedure Date  . 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 Ileostomy closure 10/31/2009  . Porta-cath insertion 08/08/2008    Family History  Problem Relation Age of Onset  . Diabetes Mother   . Diabetes Father   . Heart failure Mother   . Heart failure Father   . Stroke Father   . Diabetes Sister   . Diabetes Sister   . Diabetes Brother     History  Substance Use Topics  . Smoking status: Current Every Day Smoker    Types: Cigars  . Smokeless tobacco: Never Used  . Alcohol Use: No      Review of Systems UNABLE TO OBTAIN ROS DUE TO LEVEL 5 CAVEAT  Allergies  Review of patient's allergies indicates no known allergies.  Home Medications   No current outpatient prescriptions on file.  BP 90/35  Pulse 95  Temp 97.8 F (36.6 C) (Oral)  Resp 34  Ht 5\' 8"  (1.727  m)  Wt 162 lb 7.7 oz (73.7 kg)  BMI 24.70 kg/m2  SpO2 100% Vitals reviewed Physical Exam Physical Examination: General appearance -  Somnolent but arousable, ill appearing, and in mild distress Mental status - alert, oriented to person, place, and time Eyes - pupils equal and reactive, no conjunctival injection, no scleral icterus Mouth - mucous membranes dry, OP without lesions Chest - clear to auscultation, no wheezes, rales or rhonchi, symmetric air entry Heart - tachycardic rate, regular rhythm, normal S1, S2, no murmurs, rubs, clicks or gallops Abdomen - soft, nontender, nondistended, no masses or organomegaly, nabs Musculoskeletal - pain with ROM of right shoulder, ttp in anterior right shoulder, ttp over distal right forearm with some mild overlying bruising Neurological- somnolent but arousable, cranial nerves 2-12 grossly intact, strength 5/5 in bilateral upper extremities- grip strength in right hand somewhat limited by pain but intact, LLE with pallor/cyanosis- unable to assess strength Extremities - 2+ radial pulses in upper extremities, LLE unable to find DP or PT pulse, able to find popliteal pulse with doppler only, LLE mottled, cold, cyanotic- dorsum of left foot with skin denuded and oozing blood Skin - poor skin turgor, as described above  ED Course  Procedures (including critical care time)   Date: 06/29/2012  Rate: 106  Rhythm: sinus tachycardia with PACs  QRS Axis: normal  Intervals: normal  ST/T Wave abnormalities: normal  Conduction Disutrbances:none  Narrative Interpretation:   Old EKG Reviewed: since prior ekg of 04/29/10 rate has increased  4:10 PM d/w Dr. Darrick Penna, vascular surgery.  He will come to the ED to evaluate the patient.   4:45 PM  D/w Dr. Darnelle Catalan, [facility] hospitalist- pt to be admitted to inpatient status, team 1.  [facility] for now, may need to go [facility] if vascular is going to proceed with surgery.   CRITICAL CARE Performed by: Ethelda Chick   Total  critical care time: 50  Critical care time was exclusive of separately billable procedures and treating other patients.  Critical care was necessary to treat or prevent imminent or life-threatening deterioration.  Critical care was time spent personally by me on the following activities: development of treatment plan with patient and/or surrogate as well as nursing, discussions with consultants, evaluation of patient's response to treatment, examination of patient, obtaining history from patient or surrogate, ordering and performing treatments and interventions, ordering and review of laboratory studies, ordering and review of radiographic studies, pulse oximetry and re-evaluation of patient's condition.    Labs Reviewed  GLUCOSE, CAPILLARY - Abnormal; Notable for the following:    Glucose-Capillary 343 (*)     All other components within normal limits  CBC - Abnormal; Notable for the following:    WBC 18.5 (*)     HCT 37.1 (*)     MCHC 36.1 (*)     All other components within normal limits  BASIC METABOLIC PANEL - Abnormal; Notable for the following:    Sodium 127 (*)     Potassium 2.8 (*)     Chloride 89 (*)     CO2 18 (*)     Glucose, Bld 300 (*)     BUN 59 (*)     Creatinine, Ser 1.94 (*)     Calcium 8.0 (*)     GFR calc non Af Amer 36 (*)     GFR calc Af Amer 42 (*)     All other components within normal limits  PROTIME-INR - Abnormal; Notable for the following:    Prothrombin Time 16.9 (*)     All other components within normal limits  LACTIC ACID, PLASMA - Abnormal; Notable for the following:    Lactic Acid, Venous 6.7 (*)     All other components within normal limits  URINALYSIS, ROUTINE W REFLEX MICROSCOPIC - Abnormal; Notable for the following:    Color, Urine AMBER (*)  BIOCHEMICALS MAY BE AFFECTED BY COLOR   APPearance CLOUDY (*)     Glucose, UA >1000 (*)     Hgb urine dipstick LARGE (*)     Bilirubin Urine SMALL (*)     Ketones, ur TRACE (*)     Protein, ur  100 (*)     All other components within normal limits  GLUCOSE, CAPILLARY - Abnormal; Notable for the following:    Glucose-Capillary 300 (*)     All other components within normal limits  GLUCOSE, CAPILLARY - Abnormal; Notable for the following:    Glucose-Capillary 306 (*)     All other components within normal limits  URINE MICROSCOPIC-ADD ON - Abnormal; Notable for the following:    Casts GRANULAR CAST (*)     All other components within normal limits  HEPATIC FUNCTION PANEL - Abnormal; Notable for the following:    Albumin 1.9 (*)  Bilirubin, Direct 0.7 (*)     Indirect Bilirubin 0.1 (*)     All other components within normal limits  GLUCOSE, CAPILLARY - Abnormal; Notable for the following:    Glucose-Capillary 295 (*)     All other components within normal limits  HEMOGLOBIN A1C - Abnormal; Notable for the following:    Hemoglobin A1C 10.1 (*)     Mean Plasma Glucose 243 (*)     All other components within normal limits  MAGNESIUM - Abnormal; Notable for the following:    Magnesium 2.6 (*)     All other components within normal limits  GLUCOSE, CAPILLARY - Abnormal; Notable for the following:    Glucose-Capillary 254 (*)     All other components within normal limits  GLUCOSE, CAPILLARY - Abnormal; Notable for the following:    Glucose-Capillary 204 (*)     All other components within normal limits  GLUCOSE, CAPILLARY - Abnormal; Notable for the following:    Glucose-Capillary 198 (*)     All other components within normal limits  GLUCOSE, CAPILLARY - Abnormal; Notable for the following:    Glucose-Capillary 124 (*)     All other components within normal limits  GLUCOSE, CAPILLARY - Abnormal; Notable for the following:    Glucose-Capillary 104 (*)     All other components within normal limits  CBC - Abnormal; Notable for the following:    RBC 4.09 (*)     Hemoglobin 11.8 (*)     HCT 33.6 (*)     All other components within normal limits  BLOOD GAS, ARTERIAL -  Abnormal; Notable for the following:    pH, Arterial 7.286 (*)     pCO2 arterial 29.5 (*)     pO2, Arterial 201.0 (*)     Bicarbonate 13.6 (*)     Acid-base deficit 11.8 (*)     All other components within normal limits  PROCALCITONIN  MRSA PCR SCREENING  GLUCOSE, CAPILLARY  GLUCOSE, CAPILLARY  TYPE AND SCREEN  GLUCOSE, CAPILLARY  CULTURE, BLOOD (ROUTINE X 2)  CULTURE, BLOOD (ROUTINE X 2)  URINE CULTURE  BLOOD GAS, ARTERIAL  COMPREHENSIVE METABOLIC PANEL  LACTIC ACID, PLASMA  URINALYSIS, ROUTINE W REFLEX MICROSCOPIC  CORTISOL  FIBRINOGEN  PROCALCITONIN  TROPONIN I  ABO/RH   Dg Chest 2 View  06/29/2012  *RADIOLOGY REPORT*  Clinical Data: 62 year old male with weakness and swelling.  CHEST - 2 VIEW  Comparison: 10/28/2009 and prior chest radiographs  Findings: The cardiomediastinal silhouette is unremarkable. Mild peribronchial thickening is stable. There is no evidence of focal airspace disease, pulmonary edema, suspicious pulmonary nodule/mass, pleural effusion, or pneumothorax. No acute bony abnormalities are identified.  IMPRESSION: No evidence of active cardiopulmonary disease.   Original Report Authenticated By: Harmon Pier, M.D.    Dg Shoulder Right  06/29/2012  *RADIOLOGY REPORT*  Clinical Data: 62 year old male with right shoulder pain and swelling.  RIGHT SHOULDER - 2+ VIEW  Comparison: 10/28/2009 chest radiograph  Findings: There is no evidence of acute bony abnormality. There is no evidence of acute fracture, subluxation, or dislocation. No focal bony lesions are identified. The visualized right hemithorax is unremarkable.  Moderate degenerative changes at the [facility] joint noted. Decreased acromiohumeral space may represent a chronic rotator cuff tear.  IMPRESSION:  No evidence of acute bony abnormality.  Moderate AC joint degenerative changes and possible chronic rotator cuff tear.   Original Report Authenticated By: Harmon Pier, M.D.    Dg Forearm Right  06/29/2012  *RADIOLOGY  REPORT*  Clinical Data: Right forearm pain and swelling.  RIGHT FOREARM - 2 VIEW  Comparison: None  Findings: No evidence of acute fracture, subluxation or dislocation identified.  No radio-opaque foreign bodies are present.  No focal bony lesions are noted.  The joint spaces are unremarkable.  Vascular calcifications are noted.  IMPRESSION: No evidence of acute bony abnormality.   Original Report Authenticated By: Harmon Pier, M.D.    Dg Chest Portable 1 View  06/30/2012  *RADIOLOGY REPORT*  Clinical Data: Central line placement.  PORTABLE CHEST - 1 VIEW  Comparison: Chest radiograph performed 06/29/2012  Findings: The lungs are well-aerated.  Mild right basilar opacity may reflect atelectasis or possibly pneumonia.  There is no evidence of pleural effusion or pneumothorax.  The cardiomediastinal silhouette is within normal limits.  No acute osseous abnormalities are seen.  A right IJ line is noted ending about the mid SVC.  IMPRESSION:  1.  Right IJ line noted ending about the mid SVC. 2.  Mild right basilar airspace opacity may reflect atelectasis or possibly pneumonia.   Original Report Authenticated By: Tonia Ghent, M.D.    Dg Foot Complete Left  06/29/2012  *RADIOLOGY REPORT*  Clinical Data: 62 year old male with left foot pain and wounds on left foot.  LEFT FOOT - COMPLETE 3+ VIEW  Comparison: None  Findings: Soft tissue gas and swelling is identified at the level of the lateral MTP joints. There is no evidence of acute fracture, subluxation or dislocation. The Lisfranc joints are intact. No focal bony lesions are present except for a large plantar calcaneal spur. Vascular calcifications are present.  IMPRESSION: Soft tissue gas and swelling overlying the distal foot suspicious either for open wound or gas forming infection.  No radiographic evidence of osteomyelitis.  No acute bony abnormalities identified.  Calcaneal spur and vascular calcifications.   Original Report Authenticated By: Harmon Pier, M.D.       1. Diabetic foot   2. Sepsis   3. Dehydration   4. Lactic acidosis   5. Hyperglycemia   6. AKI (acute kidney injury)   7. Diabetic foot ulcer   8. Metabolic acidosis   9. Septic shock(785.52)   10. Type II or unspecified type diabetes mellitus without mention of complication, not stated as uncontrolled   11. Decreased range of motion of shoulder       MDM  Pt presenting with generalized weakness and left foot pain.  On exam left lower extremity is pulseless, cyanotic, cold with denuded skin/ulceration of the dorsum of the foot.  Pt appears significant dehydrated, glucose elevated, leukocytosis, lactic acidosis of 6.7.  Pt with mild tachycardia and maintaining his blood pressure.  Blood and urine cultures obtained, pt started on broad spectrum antibitoics.  Vascular consulted for possible amputation of left lower extremity as this is a likely source of infection for his severe sepsis.  Pt treated also with IV NS fluid boluses and insulin drip.  Anion gap is likely multifactorial given sepsis and poor glucose control, probably some element of starvation ketoacidosis as well.  D/w [facility] for admission to step down.          Ethelda Chick, MD 06/30/12 [phone]

## 2012-06-29 NOTE — ED Notes (Signed)
[facility] contacted. No available truck. EMS contacted.

## 2012-06-29 NOTE — H&P (Signed)
[facility] History and Physical  Tyler [name]:[id number] DOB: 1950/09/15 DOA: 06/29/2012  Referring physician: Jerelyn Scott PCP: Tyler Stabile, MD Used to see Tyler Erickson. GI: Tyler Erickson Oncologist: Tyler Erickson  Chief Complaint: Weakness,    History of Present Illness: Tyler Erickson is an 62 y.o. male with a PMH of DM, diabetic neuropathy, stage III colon cancer status post subtotal colectomy and chemotherapy (11 cycles of FOLFOX) who presents to the ER with generalized weakness and left foot pain.  He has not seen a doctor in over 2 years due to financial issues.  The patient states his LLE has been discolored and tender for about a week.  No fever, but reports subjective chills.  He has not checked his glucoses in 2 years.  The patient also reports decreased range of motion of right shoulder for the past 3 days. His symptoms were precipitated by moving heavy furniture. The patient's sisters say that he gets $1200 per month for disability and gambles the rest of his money away.  Review of Systems: Constitutional: No fever, + chills;  Appetite diminished; + weight loss, no weight gain.  HEENT: No blurry vision, no diplopia, no pharyngitis, no dysphagia CV: No chest pain, no palpitations.  Resp: + SOB and cough (chronic). GI: No nausea, no vomiting, + diarrhea, no melena, + hematochezia x 1 day.  GU: No dysuria, no hematuria.  MSK: + myalgias and arthralgias in arms and legs.  Neuro:  No headache, no focal neurological deficits, no history of seizures.  Psych: No depression, no anxiety, "moody" according to family.  Endo: No thyroid disease, + DM, no heat intolerance, no cold intolerance, no polyuria, no polydipsia  Skin: + ischemic looking LLE.  Heme: No easy bruising, no history of blood diseases.   Past Medical History Past Medical History  Diagnosis Date  . ADENOCARCINOMA, COLON     S/P surgery/chemotherapy  . DM   . NEUROPATHY   . Kidney stones   . Large  bowel obstruction   . Ischemic colon      Past Surgical History Past Surgical History  Procedure Date  . 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 Ileostomy closure 10/31/2009  . Porta-cath insertion 08/08/2008     Social History: History   Social History  . Marital Status: Single    Spouse Name: N/A    Number of Children: 0  . Years of Education: 12   Occupational History  . Disabled since diagnosed with colon cancer   . Location manager prior to disability    Social History Main Topics  . Smoking status: Current Every Day Smoker    Types: Cigars  . Smokeless tobacco: Never Used  . Alcohol Use: No  . Drug Use: No  . Sexually Active: Not on file   Other Topics Concern  . Not on file   Social History Narrative   Single.  Lives with his brother.  No children.      Family History:  Family History  Problem Relation Age of Onset  . Diabetes Mother   . Diabetes Father   . Heart failure Mother   . Heart failure Father   . Stroke Father   . Diabetes Sister   . Diabetes Sister   . Diabetes Brother     Allergies: Review of patient's allergies indicates no known allergies.  Meds: Prior to Admission medications   Medication Sig Start Date End Date Taking? Authorizing Provider  HYDROcodone-acetaminophen (NORCO/VICODIN) 5-325 MG  per tablet Take 2 tablets by mouth every 6 (six) hours as needed. For pain   Yes Historical Provider, MD    Physical Exam: Filed Vitals:   06/29/12 1331 06/29/12 1333 06/29/12 1629 06/29/12 1630  BP: 135/72  96/57 149/127  Pulse: 109  108 109  Temp:  99.6 F (37.6 C)    TempSrc:  Rectal    Resp: 20  18   SpO2: 89% 100% 100% 100%     Physical Exam: Blood pressure 149/127, pulse 109, temperature 99.6 F (37.6 C), temperature source Rectal, resp. rate 18, SpO2 100.00%. Gen:  No acute distress. Head: Normocephalic, atraumatic. Eyes: Pupils equal, round, reactive to light and accommodation. Extraocular movements intact.  Sclerae nonicteric appear Mouth: Dry mucous membranes. No tonsillar exudates. Fair dentition. Neck: Supple, no thyromegaly, no lymphadenopathy, no jugular venous distention. Chest: Lungs clear to auscultation bilaterally. Good air movement. CV: Tachycardic. Regular. No murmurs, rubs, or gallops. Abdomen: Soft, nontender, nondistended with normal active bowel sounds. Extremities: Wet gangrene lateral aspect of left foot. First and second toe appear completely necrotic. Dusky appearance to it entire left foot. Right lower extremity has a few abrasions but otherwise intact. Skin: As noted above. Musculoskeletal: Marked diminished range of motion at the right shoulder. Cannot raise right arm up off the bed. Strong grip. Neuro: Alert and oriented x3. Cranial nerves II through XII grossly intact. Nonfocal. Psych: Affect depressed.  Labs on Admission:  Basic Metabolic Panel:  Lab 06/29/12 0865  NA 127*  K 2.8*  CL 89*  CO2 18*  GLUCOSE 300*  BUN 59*  CREATININE 1.94*  CALCIUM 8.0*  MG --  PHOS --   Liver Function Tests:  Lab 06/29/12 1355  AST 34  ALT 10  ALKPHOS 105  BILITOT 0.8  PROT 6.0  ALBUMIN 1.9*   No results found for this basename: LIPASE:5,AMYLASE:5 in the last 168 hours No results found for this basename: AMMONIA:5 in the last 168 hours CBC:  Lab 06/29/12 1420  WBC 18.5*  NEUTROABS --  HGB 13.4  HCT 37.1*  MCV 80.3  PLT 169   Cardiac Enzymes: No results found for this basename: CKTOTAL:5,CKMB:5,CKMBINDEX:5,TROPONINI:5 in the last 168 hours  BNP (last 3 results) No results found for this basename: PROBNP:3 in the last 8760 hours CBG:  Lab 06/29/12 1744 06/29/12 1628 06/29/12 1505 06/29/12 1341  GLUCAP 295* 306* 300* 343*    Radiological Exams on Admission: Dg Chest 2 View  06/29/2012  *RADIOLOGY REPORT*  Clinical Data: 62 year old male with weakness and swelling.  CHEST - 2 VIEW  Comparison: 10/28/2009 and prior chest radiographs  Findings: The  cardiomediastinal silhouette is unremarkable. Mild peribronchial thickening is stable. There is no evidence of focal airspace disease, pulmonary edema, suspicious pulmonary nodule/mass, pleural effusion, or pneumothorax. No acute bony abnormalities are identified.  IMPRESSION: No evidence of active cardiopulmonary disease.   Original Report Authenticated By: Harmon Pier, M.D.    Dg Shoulder Right  06/29/2012  *RADIOLOGY REPORT*  Clinical Data: 62 year old male with right shoulder pain and swelling.  RIGHT SHOULDER - 2+ VIEW  Comparison: 10/28/2009 chest radiograph  Findings: There is no evidence of acute bony abnormality. There is no evidence of acute fracture, subluxation, or dislocation. No focal bony lesions are identified. The visualized right hemithorax is unremarkable.  Moderate degenerative changes at the [facility] joint noted. Decreased acromiohumeral space may represent a chronic rotator cuff tear.  IMPRESSION:  No evidence of acute bony abnormality.  Moderate AC joint degenerative changes and  possible chronic rotator cuff tear.   Original Report Authenticated By: Harmon Pier, M.D.    Dg Forearm Right  06/29/2012  *RADIOLOGY REPORT*  Clinical Data: Right forearm pain and swelling.  RIGHT FOREARM - 2 VIEW  Comparison: None  Findings: No evidence of acute fracture, subluxation or dislocation identified.  No radio-opaque foreign bodies are present.  No focal bony lesions are noted.  The joint spaces are unremarkable.  Vascular calcifications are noted.  IMPRESSION: No evidence of acute bony abnormality.   Original Report Authenticated By: Harmon Pier, M.D.    Dg Foot Complete Left  06/29/2012  *RADIOLOGY REPORT*  Clinical Data: 62 year old male with left foot pain and wounds on left foot.  LEFT FOOT - COMPLETE 3+ VIEW  Comparison: None  Findings: Soft tissue gas and swelling is identified at the level of the lateral MTP joints. There is no evidence of acute fracture, subluxation or dislocation. The Lisfranc joints  are intact. No focal bony lesions are present except for a large plantar calcaneal spur. Vascular calcifications are present.  IMPRESSION: Soft tissue gas and swelling overlying the distal foot suspicious either for open wound or gas forming infection.  No radiographic evidence of osteomyelitis.  No acute bony abnormalities identified.  Calcaneal spur and vascular calcifications.   Original Report Authenticated By: Harmon Pier, M.D.     EKG: Independently reviewed. Sinus tachycardia with borderline T-wave abnormalities.  Assessment/Plan Principal Problem:  *Gangrene of foot / sepsis  Seen and evaluated by Dr. Fabienne Bruns of vascular surgery. Plan for left lower extremity below the knee amputation 06/30/2012.  Has evidence of active infection with gas-forming organisms on plain film therefore we will initiate broad-spectrum antibiotics with empiric vancomycin, Zosyn, and clindamycin.  Serum lactic acid level is elevated at 6.7. White blood cell count is markedly elevated. Low-grade fever noted. Pro calcitonin pending. Active Problems:  ADENOCARCINOMA, COLON  Not in active treatment at present.  S/P colectomy/11 cycles of FOLFOX.  DM  Continue insulin per glucostabilizer protocol.  Check hemoglobin A1c.  NEUROPATHY  Likely from diabetes and possibly prior chemotherapy. Will need a vascular evaluation of right lower extremity given findings in left lower extremity.  Hyponatremia  Likely pseudohyponatremia related to high blood glucoses. Monitor.  Hypokalemia  Replace potassium and IV fluids. Check magnesium.  Metabolic acidosis  Secondary to elevated serum lactate levels. Hydrate and treat infection.  AKI (acute kidney injury)  Hydrate and monitor.  Leukocytosis  Secondary to gangrenous left foot. On empiric broad-spectrum antibiotics.  Diabetic foot ulcer  For amputation.  Decreased range of motion of shoulder  Suspect right rotator cuff injury or complete tear. We'll  need an MRI of his right shoulder once he is stable.  Code Status: Full Family Communication: Tyler Erickson (sister) [phone]; Tyler Erickson (sister) [phone]. Disposition Plan: Home versus SNF depending on condition after surgery.  Time spent: 1 hour.  Loris Winrow [facility] Pager [phone]  If 7PM-7AM, please contact night-coverage [email or web address] Password [facility] 06/29/2012, 5:57 PM

## 2012-06-29 NOTE — ED Notes (Signed)
NWG:[id number]<ZH> Expected date:06/29/12<BR> Expected time:12:56 PM<BR> Means of arrival:Ambulance<BR> Comments:<BR> Elderly/general malaise

## 2012-06-29 NOTE — Consult Note (Signed)
[facility] [place] [facility] HISTORY AND PHYSICAL   History of Present Illness:  Patient is a 62 y.o. year old male who presents for evaluation of left foot ischemia.  Pt has had pain in the left foot for about 2 weeks.  Was brought to ER today by family with worsening pain.  He also fell several days ago and complains of pain in his right upper extremity as well as weakness of this.  He has baseline neuropathy in all extremities secondary to prior chemotherapy. He did have a fever some over the weekend according to his family.  According to his family he has not taken his diabetes medications for over 2 years. Other medical problems include diabetes, history of colon cancer.  He was said to be disease free in 2011 but has not had follow up since then.  He also has had chronic exacerbation and remission of right foot ulcers.  Past Medical History  Diagnosis Date  . ADENOCARCINOMA, COLON     S/P surgery/chemotherapy  . DM   . NEUROPATHY   . Kidney stones   . Large bowel obstruction   . Ischemic colon     Past Surgical History  Procedure Date  . 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 Ileostomy closure 10/31/2009  . Porta-cath insertion 08/08/2008     Social History History  Substance Use Topics  . Smoking status: Not on file  . Smokeless tobacco: Not on file  . Alcohol Use: No    Family History No family history on file.  Allergies  No Known Allergies   Current Facility-Administered Medications  Medication Dose Route Frequency Provider Last Rate Last Dose  . 0.9 %  sodium chloride infusion   Intravenous Continuous Ethelda Chick, MD      . dextrose 5 %-0.45 % sodium chloride infusion   Intravenous Continuous Ethelda Chick, MD      . insulin regular (NOVOLIN R,HUMULIN R) 1 Units/mL in sodium chloride 0.9 % 100 mL infusion   Intravenous Continuous Ethelda Chick, MD 2.5 mL/hr at 06/29/12 1645 2.5 Units/hr at 06/29/12 1645  . insulin regular bolus via  infusion 0-10 Units  0-10 Units Intravenous TID WC Ethelda Chick, MD      . [COMPLETED] sodium chloride 0.9 % bolus 1,000 mL  1,000 mL Intravenous Once Ethelda Chick, MD 1,000 mL/hr at 06/29/12 1645 1,000 mL at 06/29/12 1645  . sodium chloride 0.9 % bolus 1,000 mL  1,000 mL Intravenous Once Ethelda Chick, MD      . vancomycin (VANCOCIN) IVPB 1000 mg/200 mL premix  1,000 mg Intravenous Once Ethelda Chick, MD 200 mL/hr at 06/29/12 1637 1,000 mg at 06/29/12 1637   Current Outpatient Prescriptions  Medication Sig Dispense Refill  . HYDROcodone-acetaminophen (NORCO/VICODIN) 5-325 MG per tablet Take 2 tablets by mouth every 6 (six) hours as needed. For pain        ROS:   General:  No weight loss, + Fever, + chills  HEENT: No recent headaches, no nasal bleeding, no visual changes, no sore throat  Neurologic: No dizziness, blackouts, seizures. No recent symptoms of stroke or mini- stroke. No recent episodes of slurred speech, or temporary blindness.  Cardiac: No recent episodes of chest pain/pressure, no shortness of breath at rest.  No shortness of breath with exertion.  Denies history of atrial fibrillation or irregular heartbeat  Vascular: No history of rest pain in feet.  No history of claudication.  + history  of non-healing ulcer right foot, No history of DVT   Pulmonary: No home oxygen, no productive cough, no hemoptysis,  No asthma or wheezing  Musculoskeletal:  [ ]  Arthritis, [ ]  Low back pain,  [x ] Joint pain right arm  Hematologic:No history of hypercoagulable state.  No history of easy bleeding.  No history of anemia  Gastrointestinal: No hematochezia or melena,  No gastroesophageal reflux, no trouble swallowing  Urinary: [ ]  chronic Kidney disease, [ ]  on HD - [ ]  MWF or [ ]  TTHS, [ ]  Burning with urination, [ ]  Frequent urination, [ ]  Difficulty urinating;   Skin: No rashes  Psychological: No history of anxiety,  No history of depression   Physical  Examination  Filed Vitals:   06/29/12 1331 06/29/12 1333 06/29/12 1629  BP: 135/72  96/57  Pulse: 109  108  Temp:  99.6 F (37.6 C)   TempSrc:  Rectal   Resp: 20  18  SpO2: 89% 100% 100%    There is no height or weight on file to calculate BMI.  General:  Lethargic but oriented, no acute distress  HEENT: Normal Neck: No bruit or JVD Pulmonary: Clear to auscultation bilaterally Cardiac: Irregular Rate and Rhythm without murmur PAC and PVC on monitor Abdomen: Soft, non-tender, non-distended, no mass, lower midline scar Skin: left upper extremity erythema medial forearm no induration, right upper extremity erythema right forearm Extremity Pulses:  Absent radial ulnar brachial bilat with doppler signals, 2+ femoral and popliteal pulses with absent dorsalis pedis, posterior tibial pulses bilaterally, AT and PT doppler left foot, DP PT doppler right foot, ulcer right foot 5th metatarsal and tip of first toe, left foot superficial slough of skin dorsal foot, wet gangrene lateral aspect of foot, first and 2nd toe cadaveric in appearance Musculoskeletal: No deformity, right forearm edema  Neurologic: Upper and lower extremity motor 5/5 left side, right arm hand elbow shoulder flexion 3-4/5 definitely assymetric to left  DATA:  CBC    Component Value Date/Time   WBC 18.5* 06/29/2012 1420   WBC 8.5 04/14/2010 0958   RBC 4.62 06/29/2012 1420   RBC 4.67 04/14/2010 0958   HGB 13.4 06/29/2012 1420   HGB 13.9 04/14/2010 0958   HCT 37.1* 06/29/2012 1420   HCT 39.9 04/14/2010 0958   PLT 169 06/29/2012 1420   PLT 215 04/14/2010 0958   MCV 80.3 06/29/2012 1420   MCV 85.5 04/14/2010 0958   MCH 29.0 06/29/2012 1420   MCH 28.4 12/11/2009 1332   MCHC 36.1* 06/29/2012 1420   MCHC 34.9 04/14/2010 0958   RDW 13.0 06/29/2012 1420   RDW 14.0 04/14/2010 0958   LYMPHSABS 1.3 04/14/2010 0958   LYMPHSABS 1.8 10/28/2009 1345   MONOABS 0.5 04/14/2010 0958   MONOABS 0.6 10/28/2009 1345   EOSABS 0.1 04/14/2010 0958    EOSABS 0.1 10/28/2009 1345   BASOSABS 0.0 04/14/2010 0958   BASOSABS 0.0 10/28/2009 1345    BMET    Component Value Date/Time   NA 127* 06/29/2012 1420   K 2.8* 06/29/2012 1420   CL 89* 06/29/2012 1420   CO2 18* 06/29/2012 1420   GLUCOSE 300* 06/29/2012 1420   BUN 59* 06/29/2012 1420   CREATININE 1.94* 06/29/2012 1420   CALCIUM 8.0* 06/29/2012 1420   GFRNONAA 36* 06/29/2012 1420   GFRAA 42* 06/29/2012 1420    ASSESSMENT: 1.  Wet gangrene left foot  Not salvageable  2.  Chronic ulcers right foot probably some element of PAD                              3.  Weakness right upper extremity                              4. Bilateral forearm erythema                  5.  Leukocytosis most likely secondary to #1                            6. Multiple electrolyte derangements hyponatremia, renal failure, hyperglycemia, hypokalemia   PLAN: 1. Left BKA tomorrow will need transfer to [facility] this evening also needs broad spectrum antibiotics             2. NPO post midnight, consent             3. Further evaluation of right upper extremity need to rule out stroke will leave at discretion of                   medical team             4. Renal failure with multiple metabolic derangements per primary team, recheck labs a.m.            5. Poorly controlled diabetes per primary team  Fabienne Bruns, MD [facility] Office: [phone] Pager: [phone]

## 2012-06-29 NOTE — ED Notes (Signed)
Pt in X-Ray ?

## 2012-06-29 NOTE — Progress Notes (Signed)
ANTIBIOTIC CONSULT NOTE - INITIAL  Pharmacy Consult for Vancomycin/Zosyn Indication: Gangrenous LLE  No Known Allergies  Patient Measurements:   TBW 68.5 kg  Vital Signs: Temp: 99.6 F (37.6 C) (01/02 1333) Temp src: Rectal (01/02 1333) BP: 95/55 mmHg (01/02 1845) Pulse Rate: 42  (01/02 1845) Intake/Output from previous day:   Intake/Output from this shift:    Labs:  Basename 06/29/12 1420  WBC 18.5*  HGB 13.4  PLT 169  LABCREA --  CREATININE 1.94*   The CrCl is unknown because both a height and weight (above a minimum accepted value) are required for this calculation. No results found for this basename: VANCOTROUGH:2,VANCOPEAK:2,VANCORANDOM:2,GENTTROUGH:2,GENTPEAK:2,GENTRANDOM:2,TOBRATROUGH:2,TOBRAPEAK:2,TOBRARND:2,AMIKACINPEAK:2,AMIKACINTROU:2,AMIKACIN:2, in the last 72 hours   Microbiology: No results found for this or any previous visit (from the past 720 hour(s)).  Medical History: Past Medical History  Diagnosis Date  . ADENOCARCINOMA, COLON     S/P surgery/chemotherapy  . DM   . NEUROPATHY   . Kidney stones   . Large bowel obstruction   . Ischemic colon    Medications:  Anti-infectives     Start     Dose/Rate Route Frequency Ordered Stop   06/30/12 0400   vancomycin (VANCOCIN) 500 mg in sodium chloride 0.9 % 100 mL IVPB        500 mg 100 mL/hr over 60 Minutes Intravenous Every 12 hours 06/29/12 1912     06/29/12 2200  piperacillin-tazobactam (ZOSYN) IVPB 3.375 g       3.375 g 12.5 mL/hr over 240 Minutes Intravenous 3 times per day 06/29/12 1912     06/29/12 1900   clindamycin (CLEOCIN) IVPB 600 mg        600 mg 100 mL/hr over 30 Minutes Intravenous 4 times per day 06/29/12 1847     06/29/12 1500   vancomycin (VANCOCIN) IVPB 1000 mg/200 mL premix        1,000 mg 200 mL/hr over 60 Minutes Intravenous  Once 06/29/12 1454 06/29/12 1752   06/29/12 1500   piperacillin-tazobactam (ZOSYN) IVPB 4.5 g  Status:  Discontinued        4.5 g 200 mL/hr  over 30 Minutes Intravenous  Once 06/29/12 1454 06/29/12 1459   06/29/12 1500  piperacillin-tazobactam (ZOSYN) IVPB 3.375 g       3.375 g 100 mL/hr over 30 Minutes Intravenous  Once 06/29/12 1459 06/29/12 1752         Assessment: 61 yoM admitted with gangrenous LLE. Hx of DM, non-compliant with meds x 2 years. Neuropathy secondary to chemo treatment of colon Ca.  Painful LLE x 2 weeks, ischemic limb; for BKA on 1/3 at [facility]. RLE with chronic ulcers.  Clindamycin 600mg  q6h per MD, Vancomycin and Zosyn dosed per pharmacy.  Received one dose Vancomycin 1gm and Zosyn 3.375gm today in ED.  Goal of Therapy:  Vancomycin trough level 15-20 mcg/ml  Plan:   Vancomycin 500mg  q12  Zosyn 3.375 gm q8h-4 hr infusion  Clindamycin 600mg  q6hr appropriate-no renal adjustment needed.  Otho Bellows PharmD Pager [phone] 06/29/2012,7:09 PM

## 2012-06-30 ENCOUNTER — Encounter (HOSPITAL_COMMUNITY): Payer: Self-pay | Admitting: Anesthesiology

## 2012-06-30 ENCOUNTER — Inpatient Hospital Stay (HOSPITAL_COMMUNITY): Payer: Medicare HMO

## 2012-06-30 ENCOUNTER — Encounter (HOSPITAL_COMMUNITY)
Admission: EM | Disposition: A | Discharge status: Skilled Nursing Facility | Payer: Self-pay | Source: Home / Self Care | Attending: Internal Medicine

## 2012-06-30 ENCOUNTER — Inpatient Hospital Stay (HOSPITAL_COMMUNITY): Payer: Medicare HMO | Admitting: Anesthesiology

## 2012-06-30 DIAGNOSIS — I742 Embolism and thrombosis of arteries of the upper extremities: Secondary | ICD-10-CM

## 2012-06-30 DIAGNOSIS — L97509 Non-pressure chronic ulcer of other part of unspecified foot with unspecified severity: Secondary | ICD-10-CM

## 2012-06-30 DIAGNOSIS — E872 Acidosis: Secondary | ICD-10-CM

## 2012-06-30 DIAGNOSIS — M7989 Other specified soft tissue disorders: Secondary | ICD-10-CM

## 2012-06-30 DIAGNOSIS — R6521 Severe sepsis with septic shock: Secondary | ICD-10-CM | POA: Diagnosis present

## 2012-06-30 DIAGNOSIS — R0989 Other specified symptoms and signs involving the circulatory and respiratory systems: Secondary | ICD-10-CM

## 2012-06-30 DIAGNOSIS — E1169 Type 2 diabetes mellitus with other specified complication: Secondary | ICD-10-CM

## 2012-06-30 DIAGNOSIS — A419 Sepsis, unspecified organism: Secondary | ICD-10-CM | POA: Diagnosis present

## 2012-06-30 DIAGNOSIS — N179 Acute kidney failure, unspecified: Secondary | ICD-10-CM

## 2012-06-30 HISTORY — PX: INTRAOPERATIVE ARTERIOGRAM: SHX5157

## 2012-06-30 HISTORY — PX: AORTOGRAM: SHX6300

## 2012-06-30 HISTORY — PX: AMPUTATION: SHX166

## 2012-06-30 LAB — URINALYSIS, ROUTINE W REFLEX MICROSCOPIC
Glucose, UA: 100 mg/dL — AB
Leukocytes, UA: NEGATIVE
Nitrite: NEGATIVE
Protein, ur: 100 mg/dL — AB
Specific Gravity, Urine: 1.027 (ref 1.005–1.030)
Urobilinogen, UA: 1 mg/dL (ref 0.0–1.0)

## 2012-06-30 LAB — PROTIME-INR: Prothrombin Time: 16.8 seconds — ABNORMAL HIGH (ref 11.6–15.2)

## 2012-06-30 LAB — COMPREHENSIVE METABOLIC PANEL
ALT: 12 U/L (ref 0–53)
BUN: 48 mg/dL — ABNORMAL HIGH (ref 6–23)
CO2: 14 mEq/L — ABNORMAL LOW (ref 19–32)
Calcium: 6.6 mg/dL — ABNORMAL LOW (ref 8.4–10.5)
Chloride: 108 mEq/L (ref 96–112)
Creatinine, Ser: 1.44 mg/dL — ABNORMAL HIGH (ref 0.50–1.35)
GFR calc Af Amer: 60 mL/min — ABNORMAL LOW (ref >90)
GFR calc non Af Amer: 51 mL/min — ABNORMAL LOW (ref >90)
Glucose, Bld: 100 mg/dL — ABNORMAL HIGH (ref 70–99)
Sodium: 139 mEq/L (ref 135–145)
Total Protein: 4.7 g/dL — ABNORMAL LOW (ref 6.0–8.3)

## 2012-06-30 LAB — BLOOD GAS, ARTERIAL
Acid-base deficit: 11.8 mmol/L — ABNORMAL HIGH (ref 0.0–2.0)
Acid-base deficit: 18.4 mmol/L — ABNORMAL HIGH (ref 0.0–2.0)
Acid-base deficit: 11.1 mmol/L — ABNORMAL HIGH (ref 0.0–2.0)
Bicarbonate: 13.6 mEq/L — ABNORMAL LOW (ref 20.0–24.0)
Bicarbonate: 10.9 mEq/L — ABNORMAL LOW (ref 20.0–24.0)
Bicarbonate: 14 mEq/L — ABNORMAL LOW (ref 20.0–24.0)
Drawn by: 31101
FIO2: 0.28 %
FIO2: 1 %
FIO2: 100 %
O2 Saturation: 99.1 %
O2 Saturation: 98.6 %
O2 Saturation: 99.8 %
PEEP: 5 cmH2O
PEEP: 5 cmH2O
Patient temperature: 98.6
Patient temperature: 98.6
TCO2: 14.5 mmol/L (ref 0–100)
TCO2: 14.9 mmol/L (ref 0–100)
pH, Arterial: 7.286 — ABNORMAL LOW (ref 7.350–7.450)
pO2, Arterial: 226 mmHg — ABNORMAL HIGH (ref 80.0–100.0)

## 2012-06-30 LAB — GLUCOSE, CAPILLARY
Glucose-Capillary: 104 mg/dL — ABNORMAL HIGH (ref 70–99)
Glucose-Capillary: 115 mg/dL — ABNORMAL HIGH (ref 70–99)
Glucose-Capillary: 207 mg/dL — ABNORMAL HIGH (ref 70–99)
Glucose-Capillary: 88 mg/dL (ref 70–99)

## 2012-06-30 LAB — HEMOGLOBIN A1C
Hgb A1c MFr Bld: 10.1 % — ABNORMAL HIGH (ref <5.7)
Mean Plasma Glucose: 243 mg/dL — ABNORMAL HIGH (ref <117)

## 2012-06-30 LAB — LACTIC ACID, PLASMA: Lactic Acid, Venous: 3.2 mmol/L — ABNORMAL HIGH (ref 0.5–2.2)

## 2012-06-30 LAB — CBC
HCT: 33.6 % — ABNORMAL LOW (ref 39.0–52.0)
Hemoglobin: 11.8 g/dL — ABNORMAL LOW (ref 13.0–17.0)
MCH: 28.9 pg (ref 26.0–34.0)
MCHC: 35.1 g/dL (ref 30.0–36.0)
MCV: 82.2 fL (ref 78.0–100.0)
RBC: 4.09 MIL/uL — ABNORMAL LOW (ref 4.22–5.81)

## 2012-06-30 LAB — TYPE AND SCREEN
ABO/RH(D): O POS
Antibody Screen: NEGATIVE

## 2012-06-30 LAB — BASIC METABOLIC PANEL
GFR calc Af Amer: 50 mL/min — ABNORMAL LOW (ref >90)
GFR calc non Af Amer: 43 mL/min — ABNORMAL LOW (ref >90)
Glucose, Bld: 251 mg/dL — ABNORMAL HIGH (ref 70–99)
Potassium: 3.2 mEq/L — ABNORMAL LOW (ref 3.5–5.1)
Sodium: 137 mEq/L (ref 135–145)

## 2012-06-30 LAB — SURGICAL PCR SCREEN: Staphylococcus aureus: NEGATIVE

## 2012-06-30 LAB — APTT: aPTT: 34 seconds (ref 24–37)

## 2012-06-30 LAB — PROCALCITONIN: Procalcitonin: 45.1 ng/mL

## 2012-06-30 LAB — CARBOXYHEMOGLOBIN: O2 Saturation: 96.9 %

## 2012-06-30 LAB — CORTISOL: Cortisol, Plasma: 60.2 ug/dL

## 2012-06-30 LAB — URINE MICROSCOPIC-ADD ON

## 2012-06-30 SURGERY — AMPUTATION BELOW KNEE
Anesthesia: General | Site: Leg Lower | Laterality: Right | Wound class: Clean

## 2012-06-30 MED ORDER — SODIUM BICARBONATE 8.4 % IV SOLN
50.0000 meq | Freq: Once | INTRAVENOUS | Status: AC
  Administered 2012-06-30: 50 meq via INTRAVENOUS

## 2012-06-30 MED ORDER — FENTANYL CITRATE 0.05 MG/ML IJ SOLN
INTRAMUSCULAR | Status: DC | PRN
  Administered 2012-06-30: 100 ug via INTRAVENOUS

## 2012-06-30 MED ORDER — SODIUM CHLORIDE 0.9 % IV BOLUS (SEPSIS): 25.0000 mL/kg | Freq: Once | INTRAVENOUS | Status: DC

## 2012-06-30 MED ORDER — SODIUM CHLORIDE 0.9 % IV BOLUS (SEPSIS): 500.0000 mL | Freq: Once | INTRAVENOUS | Status: DC

## 2012-06-30 MED ORDER — SODIUM CHLORIDE 0.9 % IV SOLN
INTRAVENOUS | Status: DC
  Administered 2012-06-30: 100 mL/h via INTRAVENOUS

## 2012-06-30 MED ORDER — MIDAZOLAM HCL 2 MG/2ML IJ SOLN: 2.0000 mg | Freq: Once | INTRAMUSCULAR | Status: DC

## 2012-06-30 MED ORDER — SUCCINYLCHOLINE CHLORIDE 20 MG/ML IJ SOLN
INTRAMUSCULAR | Status: DC | PRN
  Administered 2012-06-30: 100 mg via INTRAVENOUS

## 2012-06-30 MED ORDER — SODIUM CHLORIDE 0.9 % IV BOLUS (SEPSIS): 500.0000 mL | INTRAVENOUS | Status: DC | PRN

## 2012-06-30 MED ORDER — MIDAZOLAM HCL 5 MG/5ML IJ SOLN
INTRAMUSCULAR | Status: DC | PRN
  Administered 2012-06-30: .5 mg via INTRAVENOUS

## 2012-06-30 MED ORDER — PROPRANOLOL HCL 1 MG/ML IV SOLN
1.0000 mg | INTRAVENOUS | Status: DC
  Filled 2012-06-30: qty 1

## 2012-06-30 MED ORDER — SODIUM BICARBONATE 8.4 % IV SOLN
75.0000 meq | Freq: Once | INTRAVENOUS | Status: AC
  Administered 2012-06-30: 75 meq via INTRAVENOUS
  Filled 2012-06-30: qty 75

## 2012-06-30 MED ORDER — INSULIN GLARGINE 100 UNIT/ML ~~LOC~~ SOLN
10.0000 [IU] | Freq: Every day | SUBCUTANEOUS | Status: DC
  Administered 2012-07-01 – 2012-07-05 (×5): 10 [IU] via SUBCUTANEOUS

## 2012-06-30 MED ORDER — NOREPINEPHRINE BITARTRATE 1 MG/ML IJ SOLN
2.0000 ug/min | INTRAVENOUS | Status: DC
  Administered 2012-06-30: 12.8 ug/min via INTRAVENOUS
  Filled 2012-06-30: qty 16

## 2012-06-30 MED ORDER — SODIUM CHLORIDE 0.9 % IV BOLUS (SEPSIS)
1000.0000 mL | Freq: Once | INTRAVENOUS | Status: AC
  Administered 2012-06-30: 1000 mL via INTRAVENOUS

## 2012-06-30 MED ORDER — ONDANSETRON HCL 4 MG/2ML IJ SOLN
INTRAMUSCULAR | Status: DC | PRN
  Administered 2012-06-30: 4 mg via INTRAVENOUS

## 2012-06-30 MED ORDER — DOPAMINE-DEXTROSE 3.2-5 MG/ML-% IV SOLN: 3.0000 ug/kg/min | INTRAVENOUS | Status: DC

## 2012-06-30 MED ORDER — SODIUM CHLORIDE 0.9 % IV BOLUS (SEPSIS): 1000.0000 mL | Freq: Once | INTRAVENOUS | Status: DC

## 2012-06-30 MED ORDER — METOPROLOL TARTRATE 1 MG/ML IV SOLN: 2.0000 mg | INTRAVENOUS | Status: DC | PRN

## 2012-06-30 MED ORDER — ROCURONIUM BROMIDE 100 MG/10ML IV SOLN
INTRAVENOUS | Status: DC | PRN
  Administered 2012-06-30: 40 mg via INTRAVENOUS

## 2012-06-30 MED ORDER — FENTANYL CITRATE 0.05 MG/ML IJ SOLN
50.0000 ug | INTRAMUSCULAR | Status: DC | PRN
  Administered 2012-06-30 – 2012-07-02 (×7): 100 ug via INTRAVENOUS
  Filled 2012-06-30 (×7): qty 2

## 2012-06-30 MED ORDER — MEPERIDINE HCL 25 MG/ML IJ SOLN: 6.2500 mg | INTRAMUSCULAR | Status: DC | PRN

## 2012-06-30 MED ORDER — SODIUM BICARBONATE 8.4 % IV SOLN
INTRAVENOUS | Status: DC
  Administered 2012-06-30 – 2012-07-01 (×3): via INTRAVENOUS
  Filled 2012-06-30 (×6): qty 150

## 2012-06-30 MED ORDER — ETOMIDATE 2 MG/ML IV SOLN
INTRAVENOUS | Status: DC | PRN
  Administered 2012-06-30: 14 mg via INTRAVENOUS

## 2012-06-30 MED ORDER — POTASSIUM CHLORIDE 10 MEQ/50ML IV SOLN
10.0000 meq | INTRAVENOUS | Status: AC
  Administered 2012-06-30 – 2012-07-01 (×6): 10 meq via INTRAVENOUS
  Filled 2012-06-30 (×6): qty 50

## 2012-06-30 MED ORDER — HYDROMORPHONE HCL PF 1 MG/ML IJ SOLN: 0.2500 mg | INTRAMUSCULAR | Status: DC | PRN

## 2012-06-30 MED ORDER — SODIUM CHLORIDE 0.9 % IR SOLN
Status: DC | PRN
  Administered 2012-06-30: 13:00:00

## 2012-06-30 MED ORDER — ONDANSETRON HCL 4 MG/2ML IJ SOLN: 4.0000 mg | Freq: Four times a day (QID) | INTRAMUSCULAR | Status: DC | PRN

## 2012-06-30 MED ORDER — DOBUTAMINE IN D5W 4-5 MG/ML-% IV SOLN: 2.5000 ug/kg/min | INTRAVENOUS | Status: DC | PRN

## 2012-06-30 MED ORDER — DEXTROSE 5 % IV SOLN: 1.5000 g | Freq: Two times a day (BID) | INTRAVENOUS | Status: DC

## 2012-06-30 MED ORDER — HYDRALAZINE HCL 20 MG/ML IJ SOLN: 10.0000 mg | INTRAMUSCULAR | Status: DC | PRN

## 2012-06-30 MED ORDER — IODIXANOL 320 MG/ML IV SOLN
INTRAVENOUS | Status: DC | PRN
  Administered 2012-06-30 (×2): 50 mL via INTRA_ARTERIAL
  Administered 2012-06-30: 100 mL via INTRA_ARTERIAL

## 2012-06-30 MED ORDER — MIDAZOLAM HCL 2 MG/2ML IJ SOLN: 2.0000 mg | INTRAMUSCULAR | Status: DC | PRN

## 2012-06-30 MED ORDER — SODIUM CHLORIDE 0.9 % IV SOLN
INTRAVENOUS | Status: DC | PRN
  Administered 2012-06-30 (×2): via INTRAVENOUS

## 2012-06-30 MED ORDER — NEOSTIGMINE METHYLSULFATE 1 MG/ML IJ SOLN
INTRAMUSCULAR | Status: DC | PRN
  Administered 2012-06-30: 5 mg via INTRAVENOUS

## 2012-06-30 MED ORDER — LABETALOL HCL 5 MG/ML IV SOLN: 10.0000 mg | INTRAVENOUS | Status: DC | PRN

## 2012-06-30 MED ORDER — PROPRANOLOL HCL 1 MG/ML IV SOLN
1.0000 mg | Freq: Once | INTRAVENOUS | Status: AC
  Administered 2012-06-30: 0.25 mg via INTRAVENOUS

## 2012-06-30 MED ORDER — PROMETHAZINE HCL 25 MG/ML IJ SOLN: 6.2500 mg | INTRAMUSCULAR | Status: DC | PRN

## 2012-06-30 MED ORDER — SODIUM BICARBONATE 8.4 % IV SOLN
25.0000 meq | INTRAVENOUS | Status: DC
  Filled 2012-06-30: qty 25

## 2012-06-30 MED ORDER — OXYCODONE HCL 5 MG/5ML PO SOLN: 5.0000 mg | Freq: Once | ORAL | Status: AC | PRN

## 2012-06-30 MED ORDER — OXYCODONE HCL 5 MG PO TABS: 5.0000 mg | ORAL_TABLET | Freq: Once | ORAL | Status: AC | PRN

## 2012-06-30 MED ORDER — 0.9 % SODIUM CHLORIDE (POUR BTL) OPTIME
TOPICAL | Status: DC | PRN
  Administered 2012-06-30: 1000 mL

## 2012-06-30 MED ORDER — GLYCOPYRROLATE 0.2 MG/ML IJ SOLN
INTRAMUSCULAR | Status: DC | PRN
  Administered 2012-06-30: 0.6 mg via INTRAVENOUS

## 2012-06-30 MED ORDER — INSULIN ASPART 100 UNIT/ML ~~LOC~~ SOLN
0.0000 [IU] | SUBCUTANEOUS | Status: DC
  Administered 2012-06-30 – 2012-07-01 (×2): 3 [IU] via SUBCUTANEOUS
  Administered 2012-07-01 (×2): 2 [IU] via SUBCUTANEOUS
  Administered 2012-07-01: 3 [IU] via SUBCUTANEOUS
  Administered 2012-07-02: 1 [IU] via SUBCUTANEOUS

## 2012-06-30 MED ORDER — NOREPINEPHRINE BITARTRATE 1 MG/ML IJ SOLN
2.0000 ug/min | INTRAVENOUS | Status: DC | PRN
  Administered 2012-06-30: 5 ug/min via INTRAVENOUS
  Filled 2012-06-30 (×2): qty 4

## 2012-06-30 MED ORDER — PIPERACILLIN-TAZOBACTAM 3.375 G IVPB 30 MIN: 3.3750 g | Freq: Once | INTRAVENOUS | Status: DC

## 2012-06-30 MED FILL — Medication: Qty: 1 | Status: AC

## 2012-06-30 SURGICAL SUPPLY — 74 items
BAG BANDED W/RUBBER/TAPE 36X54 (MISCELLANEOUS) ×4 IMPLANT
BANDAGE ELASTIC 6 VELCRO ST LF (GAUZE/BANDAGES/DRESSINGS) ×4 IMPLANT
BANDAGE ESMARK 6X9 LF (GAUZE/BANDAGES/DRESSINGS) IMPLANT
BANDAGE GAUZE ELAST BULKY 4 IN (GAUZE/BANDAGES/DRESSINGS) ×4 IMPLANT
BLADE SAW RECIP 87.9 MT (BLADE) ×4 IMPLANT
BNDG COHESIVE 6X5 TAN STRL LF (GAUZE/BANDAGES/DRESSINGS) ×4 IMPLANT
BNDG ESMARK 6X9 LF (GAUZE/BANDAGES/DRESSINGS)
CANISTER SUCTION 2500CC (MISCELLANEOUS) ×4 IMPLANT
CATH ACCU-VU SIZ PIG 5F 100CM (CATHETERS) ×4 IMPLANT
CATH BEACON 5.038 65CM KMP-01 (CATHETERS) ×4 IMPLANT
CATH HEADHUNTER H1 5F 100CM (CATHETERS) ×4 IMPLANT
CLIP TI MEDIUM 6 (CLIP) IMPLANT
CLOTH BEACON ORANGE TIMEOUT ST (SAFETY) ×4 IMPLANT
COVER SURGICAL LIGHT HANDLE (MISCELLANEOUS) ×4 IMPLANT
COVER TABLE BACK 60X90 (DRAPES) ×4 IMPLANT
CUFF TOURNIQUET SINGLE 18IN (TOURNIQUET CUFF) IMPLANT
CUFF TOURNIQUET SINGLE 24IN (TOURNIQUET CUFF) IMPLANT
CUFF TOURNIQUET SINGLE 34IN LL (TOURNIQUET CUFF) IMPLANT
CUFF TOURNIQUET SINGLE 44IN (TOURNIQUET CUFF) IMPLANT
DERMABOND ADVANCED (GAUZE/BANDAGES/DRESSINGS) ×1
DERMABOND ADVANCED .7 DNX12 (GAUZE/BANDAGES/DRESSINGS) ×3 IMPLANT
DRAIN CHANNEL 19F RND (DRAIN) IMPLANT
DRAPE ORTHO SPLIT 77X108 STRL (DRAPES) ×3
DRAPE PROXIMA HALF (DRAPES) ×8 IMPLANT
DRAPE SURG ORHT 6 SPLT 77X108 (DRAPES) ×9 IMPLANT
DRSG ADAPTIC 3X8 NADH LF (GAUZE/BANDAGES/DRESSINGS) ×4 IMPLANT
ELECT REM PT RETURN 9FT ADLT (ELECTROSURGICAL) ×4
ELECTRODE REM PT RTRN 9FT ADLT (ELECTROSURGICAL) ×3 IMPLANT
EVACUATOR SILICONE 100CC (DRAIN) IMPLANT
GAUZE SPONGE 4X4 16PLY XRAY LF (GAUZE/BANDAGES/DRESSINGS) ×4 IMPLANT
GLOVE BIO SURGEON STRL SZ7.5 (GLOVE) ×4 IMPLANT
GLOVE BIOGEL PI IND STRL 6.5 (GLOVE) ×6 IMPLANT
GLOVE BIOGEL PI IND STRL 7.5 (GLOVE) ×3 IMPLANT
GLOVE BIOGEL PI INDICATOR 6.5 (GLOVE) ×2
GLOVE BIOGEL PI INDICATOR 7.5 (GLOVE) ×1
GLOVE ECLIPSE 7.5 STRL STRAW (GLOVE) ×4 IMPLANT
GLOVE SS BIOGEL STRL SZ 7 (GLOVE) ×3 IMPLANT
GLOVE SUPERSENSE BIOGEL SZ 7 (GLOVE) ×1
GLOVE SURG SS PI 7.0 STRL IVOR (GLOVE) ×4 IMPLANT
GOWN BRE IMP SLV AUR LG STRL (GOWN DISPOSABLE) ×4 IMPLANT
GOWN PREVENTION PLUS XLARGE (GOWN DISPOSABLE) ×4 IMPLANT
GOWN STRL NON-REIN LRG LVL3 (GOWN DISPOSABLE) ×8 IMPLANT
GOWN STRL REIN XL XLG (GOWN DISPOSABLE) ×4 IMPLANT
KIT BASIN OR (CUSTOM PROCEDURE TRAY) ×4 IMPLANT
KIT ROOM TURNOVER OR (KITS) ×4 IMPLANT
NS IRRIG 1000ML POUR BTL (IV SOLUTION) ×4 IMPLANT
PACK GENERAL/GYN (CUSTOM PROCEDURE TRAY) ×4 IMPLANT
PAD ARMBOARD 7.5X6 YLW CONV (MISCELLANEOUS) ×8 IMPLANT
PADDING CAST COTTON 6X4 STRL (CAST SUPPLIES) IMPLANT
SET MICROPUNCTURE 5F STIFF (MISCELLANEOUS) ×8 IMPLANT
SHEATH AVANTI 11CM 5FR (MISCELLANEOUS) ×8 IMPLANT
SPONGE GAUZE 4X4 12PLY (GAUZE/BANDAGES/DRESSINGS) ×4 IMPLANT
STAPLER VISISTAT 35W (STAPLE) ×4 IMPLANT
STOCKINETTE 6  STRL (DRAPES) ×1
STOCKINETTE 6 STRL (DRAPES) ×3 IMPLANT
STOCKINETTE IMPERVIOUS LG (DRAPES) ×4 IMPLANT
STOPCOCK MORSE 400PSI 3WAY (MISCELLANEOUS) ×4 IMPLANT
SUT ETHILON 3 0 PS 1 (SUTURE) IMPLANT
SUT SILK 2 0 (SUTURE) ×2
SUT SILK 2 0 SH CR/8 (SUTURE) ×8 IMPLANT
SUT SILK 2-0 18XBRD TIE 12 (SUTURE) ×6 IMPLANT
SUT VIC AB 2-0 CT1 27 (SUTURE) ×2
SUT VIC AB 2-0 CT1 TAPERPNT 27 (SUTURE) ×6 IMPLANT
SUT VIC AB 2-0 SH 18 (SUTURE) ×8 IMPLANT
SUT VIC AB 3-0 SH 27 (SUTURE) ×2
SUT VIC AB 3-0 SH 27X BRD (SUTURE) ×6 IMPLANT
SYR 20CC LL (SYRINGE) ×4 IMPLANT
TAPE CLOTH SURG 4X10 WHT LF (GAUZE/BANDAGES/DRESSINGS) ×4 IMPLANT
TOWEL OR 17X24 6PK STRL BLUE (TOWEL DISPOSABLE) ×4 IMPLANT
TOWEL OR 17X26 10 PK STRL BLUE (TOWEL DISPOSABLE) ×8 IMPLANT
TUBING HIGH PRESSURE 120CM (CONNECTOR) ×4 IMPLANT
UNDERPAD 30X30 INCONTINENT (UNDERPADS AND DIAPERS) ×4 IMPLANT
WATER STERILE IRR 1000ML POUR (IV SOLUTION) ×4 IMPLANT
WIRE HI TORQ VERSACORE J 260CM (WIRE) ×4 IMPLANT

## 2012-06-30 NOTE — Progress Notes (Signed)
Pt arrived with CRNA via bed with O2 in progress via simple mask . Pt unresponsive as being put in bay 21 code initiated anesthesia at bedside. Cardiopulmonary resesitation sheet started pt being ambued and CPR in progress.  Arterial sheath in right groin, changed to arterial line.

## 2012-06-30 NOTE — Progress Notes (Addendum)
I have seen and evaluated [name].  He has had a progressive decline in function of both of his upper time it is, right greater than left over the past couple of days. He also has sepsis, and a gangrenous left lower extremity. He has had hypotension, and had an IV in the right upper extremity.  I was consulted to evaluate for possible compartment syndrome of the upper extremity.  Examination demonstrates no pain with passive motion of the right fingers or hand. He has some pain with passive motion of the elbow that he describes as being "all over".  He does not have palpable radial pulses on either side.  His right upper extremity has some moderate soft tissue swelling, and previous evidence for an IV which has been placed and removed. The soft tissue swelling is not severe, and his compartments clinically feel soft. I certainly have seen much tighter compartments after an infiltrated IV, however he does have neurologic deficit, and has a flicker of median nerve motor function, but I cannot get him to fire his wrist or fingers it extensors, and I also cannot get him to abduct his fingers. He reports diffuse numbness around the hand on the right side, particularly in the radial nerve and ulnar nerve distribution.  His left hand has better function, and can grasp weekly, and has slight abduction intact, and wrist and finger extension is also intact although weak.  The right upper extremity does have the appearance of cellulitis as well, streaking up towards his axilla.  The overall impression of the right upper extremity does not appear consistent with acute compartment syndrome, given that he does not have pain with passive motion of the hand or fingers, and his compartments do not feel that tight. It is possible that he may have a subacute compartment syndrome, although this is difficult to determine.  I have recommended elevation with close monitoring, and also discussed the case with Dr.  Amanda Pea, who is on call for hand surgery, who will be evaluating him later on this morning. We may give consideration to compartment pressure measurement, although they're clinical utility in a subacute compartment syndrome is questionable. This also may be a cellulitic process with inflammation and pain limiting his upper extremity function.  Eulas Post, MD    FULL ORTHOPAEDIC CONSULTATION  REQUESTING PHYSICIAN: Lupita Leash, MD  Chief Complaint: Right greater than left upper extremity swelling, numbness and weakness  HPI: TAUREAN Erickson is a 62 y.o. male who complains of right greater than left upper extremity numbness and swelling and weakness. He says that a couple of days ago his hands were normal and he was able to carry anything he wanted. Over the course of the past 48 hours she reports increasing numbness in the right hand more so than the left, with difficulty gripping, and difficulty using the hand. He has not had any trauma to the upper extremities. He did have an IV in the right side, although it was not noted to be infiltrated.  Past Medical History  Diagnosis Date  . ADENOCARCINOMA, COLON     S/P surgery/chemotherapy  . DM   . NEUROPATHY   . Kidney stones   . Large bowel obstruction   . Ischemic colon    Past Surgical History  Procedure Date  . 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 Ileostomy closure 10/31/2009  . Porta-cath insertion 08/08/2008   History   Social History  . Marital Status: Single    Spouse  Name: N/A    Number of Children: 0  . Years of Education: 12   Occupational History  . Disabled since diagnosed with colon cancer   . Location manager prior to disability    Social History Main Topics  . Smoking status: Current Every Day Smoker    Types: Cigars  . Smokeless tobacco: Never Used  . Alcohol Use: No  . Drug Use: No  . Sexually Active: None   Other Topics Concern  . None   Social History Narrative   Single.  Lives  with his brother.  No children.     Family History  Problem Relation Age of Onset  . Diabetes Mother   . Diabetes Father   . Heart failure Mother   . Heart failure Father   . Stroke Father   . Diabetes Sister   . Diabetes Sister   . Diabetes Brother    No Known Allergies Prior to Admission medications   Medication Sig Start Date End Date Taking? Authorizing Provider  HYDROcodone-acetaminophen (NORCO/VICODIN) 5-325 MG per tablet Take 2 tablets by mouth every 6 (six) hours as needed. For pain   Yes Historical Provider, MD   Dg Chest 2 View  06/29/2012  *RADIOLOGY REPORT*  Clinical Data: 62 year old male with weakness and swelling.  CHEST - 2 VIEW  Comparison: 10/28/2009 and prior chest radiographs  Findings: The cardiomediastinal silhouette is unremarkable. Mild peribronchial thickening is stable. There is no evidence of focal airspace disease, pulmonary edema, suspicious pulmonary nodule/mass, pleural effusion, or pneumothorax. No acute bony abnormalities are identified.  IMPRESSION: No evidence of active cardiopulmonary disease.   Original Report Authenticated By: Harmon Pier, M.D.    Dg Shoulder Right  06/29/2012  *RADIOLOGY REPORT*  Clinical Data: 62 year old male with right shoulder pain and swelling.  RIGHT SHOULDER - 2+ VIEW  Comparison: 10/28/2009 chest radiograph  Findings: There is no evidence of acute bony abnormality. There is no evidence of acute fracture, subluxation, or dislocation. No focal bony lesions are identified. The visualized right hemithorax is unremarkable.  Moderate degenerative changes at the [facility] joint noted. Decreased acromiohumeral space may represent a chronic rotator cuff tear.  IMPRESSION:  No evidence of acute bony abnormality.  Moderate AC joint degenerative changes and possible chronic rotator cuff tear.   Original Report Authenticated By: Harmon Pier, M.D.    Dg Forearm Right  06/29/2012  *RADIOLOGY REPORT*  Clinical Data: Right forearm pain and swelling.  RIGHT  FOREARM - 2 VIEW  Comparison: None  Findings: No evidence of acute fracture, subluxation or dislocation identified.  No radio-opaque foreign bodies are present.  No focal bony lesions are noted.  The joint spaces are unremarkable.  Vascular calcifications are noted.  IMPRESSION: No evidence of acute bony abnormality.   Original Report Authenticated By: Harmon Pier, M.D.    Dg Chest Portable 1 View  06/30/2012  *RADIOLOGY REPORT*  Clinical Data: Central line placement.  PORTABLE CHEST - 1 VIEW  Comparison: Chest radiograph performed 06/29/2012  Findings: The lungs are well-aerated.  Mild right basilar opacity may reflect atelectasis or possibly pneumonia.  There is no evidence of pleural effusion or pneumothorax.  The cardiomediastinal silhouette is within normal limits.  No acute osseous abnormalities are seen.  A right IJ line is noted ending about the mid SVC.  IMPRESSION:  1.  Right IJ line noted ending about the mid SVC. 2.  Mild right basilar airspace opacity may reflect atelectasis or possibly pneumonia.   Original Report Authenticated By: Tinnie Gens  Cherly Hensen, M.D.    Dg Foot Complete Left  06/29/2012  *RADIOLOGY REPORT*  Clinical Data: 62 year old male with left foot pain and wounds on left foot.  LEFT FOOT - COMPLETE 3+ VIEW  Comparison: None  Findings: Soft tissue gas and swelling is identified at the level of the lateral MTP joints. There is no evidence of acute fracture, subluxation or dislocation. The Lisfranc joints are intact. No focal bony lesions are present except for a large plantar calcaneal spur. Vascular calcifications are present.  IMPRESSION: Soft tissue gas and swelling overlying the distal foot suspicious either for open wound or gas forming infection.  No radiographic evidence of osteomyelitis.  No acute bony abnormalities identified.  Calcaneal spur and vascular calcifications.   Original Report Authenticated By: Harmon Pier, M.D.     Positive ROS: All other systems have been reviewed and  were otherwise negative with the exception of those mentioned in the HPI and as above.  Physical Exam: General: Interactive in the ICU, in mild distress Cardiovascular: I cannot palpate radial pulses on either upper extremity, nor can I feel them in his lower extremities. Respiratory: Mild labored breathing. GI: No organomegaly, abdomen is soft and non-tender Skin: His right upper extremity has some mild erythema that streaks up to the level of his axilla. His left lower extremity has draining gangrene from his foot. There is significant eschar development. Neurologic: His right upper time he has decreased sensation diffusely around the hand, and has very weak median nerve function, and no appreciable ulnar or radial nerve function. On his left side he can flex extend and abduct his hand although it is fairly weak. Psychiatric: Patient is competent for consent with reasonably normal mood and affect given the circumstances  MUSCULOSKELETAL: He has no pain with passive motion of the right hand or the left hand. Passively I am able to move his fingers both with palmar and dorsiflexion without difficulty. His compartments are soft in his forearm, as well as his hand, although there is some swelling, clinically it does not appear to be compartment syndrome.  Assessment: Bilateral upper extremity swelling with neurologic deficit on the right side more so than the left, in the setting of acute sepsis with hypotension, peripheral vascular disease and loss of bilateral radial pulses, with wet gangrene and systemic sepsis.  Plan: This is an acute severe life-threatening situation. Certainly it is possible that he may have had a compartment syndrome that has involved, and now is to the point where he is not having pain with passive motion any longer. Nonetheless, it did not appear clinically to be a compartment syndrome, at least not in acute compartment syndrome. If it is subacute and this certainly may be a  possibility. I have asked Dr. Amanda Pea to evaluate for second opinion regarding the upper extremity.  In the meantime I would recommend elevation, which I have ordered a [place] sling.     Jamear Carbonneau P, MD Cell [phone] Pager [phone]  06/30/2012 11:04 AM

## 2012-06-30 NOTE — Progress Notes (Addendum)
*  PRELIMINARY RESULTS* Vascular Ultrasound Upper Extremity Arterial Duplex has been completed.  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 No obvious evidence of arterial thrombosis. Preliminary results discussed with Dr.Fields.  06/30/2012 11:11 AM Gertie Fey, RDMS, RDCS

## 2012-06-30 NOTE — Procedures (Signed)
Central Venous Catheter Insertion Procedure Note [facility] [id number] 11-25-1950  Procedure: Insertion of Central Venous Catheter Indications: Assessment of intravascular volume and Drug and/or fluid administration  Procedure Details Consent: Unable to obtain consent because of emergent medical necessity. Time Out: Verified patient identification, verified procedure, site/side was marked, verified correct patient position, special equipment/implants available, medications/allergies/relevent history reviewed, required imaging and test results available.  Performed  Maximum sterile technique was used including antiseptics, cap, gloves, gown, hand hygiene, mask and sheet. Skin prep: Chlorhexidine; local anesthetic administered A antimicrobial bonded/coated triple lumen catheter was placed in the right internal jugular vein using the Seldinger technique.  Ultrasound was used to verify the patency of the vein and for real time needle guidance.  Evaluation Blood flow good Complications: No apparent complications Patient did tolerate procedure well. Chest X-ray ordered to verify placement.  CXR: normal.  Ishitha Roper 06/30/2012, 6:50 AM

## 2012-06-30 NOTE — Preoperative (Signed)
Beta Blockers   Reason not to administer Beta Blockers:Not Applicable 

## 2012-06-30 NOTE — Progress Notes (Signed)
Nurse charting on this patient was [facility] [employer] Computer opened by different nurse

## 2012-06-30 NOTE — Progress Notes (Signed)
Intubated and ambued and CPR in progress

## 2012-06-30 NOTE — Progress Notes (Signed)
06/29/2012 @ 2100:  I was asked to see pt s/p transfer from [facility].  Pt brought to [facility] ED c/o pain to LLE and (L) foot. Transferred to [facility] for planned BKA on 06/30/12. Pt met sepsis criteria and started on broad spectrum abx.   Pt arrived into room 3316 at approx 1930. At bedside pt noted resting in NAD. He awakens easily and is able to answer questions and is oriented x 2 (person and place). BBS CTA. Currently pt is quite hypotensive (95/23). Remaining VS T-98.1, P-120's, R-28-32 w/ 02 sats of 97% on R/A. Cardiac telemetry reveals ST w/o ectopy. Will continue NS boluses and continue to monitor closely.  Leanne Chang, NP-C [facility] Pager [phone]

## 2012-06-30 NOTE — Transfer of Care (Signed)
Immediate Anesthesia Transfer of Care Note  Patient: Tyler Erickson  Procedure(s) Performed: Procedure(s) (LRB) with comments: AMPUTATION BELOW KNEE (Left) INTRA OPERATIVE ARTERIOGRAM (Right) AORTOGRAM (Right) - Right upper extremity arch  COMPARTMENT MEASUREMENT (Right)  Patient Location: PACU  Anesthesia Type:General  Level of Consciousness: unresponsive  Airway & Oxygen Therapy: Patient re-intubated  Post-op Assessment: Report given to PACU RN and Post -op Vital signs reviewed and unstable, Anesthesiologist notified  Post vital signs: Reviewed and unstable  Complications: No apparent anesthesia complications

## 2012-06-30 NOTE — Progress Notes (Signed)
*  Preliminary Results* Right upper extremity venous duplex completed. Right upper extremity is negative for deep vein thrombosis. Preliminary results discussed with Dr.Fields.  06/30/2012 11:15 AM Gertie Fey, RDMS, RDCS

## 2012-06-30 NOTE — Progress Notes (Signed)
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  Will do arteriogram to further study and possible intervention at time of BKA today.  Procedures risks and benefits discussed with pt and family.  Will be some risk of contrast nephropathy.  Fabienne Bruns, MD [facility] Office: [phone] Pager: [phone]

## 2012-06-30 NOTE — Progress Notes (Signed)
CRITICAL VALUE ALERT  Critical value received:  Calcium 6.4 mg/dl  Date of notification:  06/30/2012  Time of notification:  2308  Critical value read back:yes  Nurse who received alert:  R. Manson Passey, RN   MD notified (1st page):  Dr. Darrick Penna  Time of first page:  2320  MD notified (2nd page):  Time of second page:  Responding MD:  Dr. Darrick Penna  Time MD responded:  2325

## 2012-06-30 NOTE — Consult Note (Signed)
PULMONARY  / CRITICAL CARE MEDICINE  Name: Tyler Erickson MRN: [id number] DOB: 1950/10/07    LOS: 1  REFERRING MD :  TRH  CHIEF COMPLAINT:  Foot gangrene, shock  BRIEF PATIENT DESCRIPTION: 62 y/o male with DM, stage III colon cancer who presented to the [facility] ED on 1/2 with left foot pain and chills.  He was found to have foot gangrene and developed septic shock within 24 hours of admission.  LINES / TUBES: 1/3 R IJ CVL >>  CULTURES: 1/2 blood >> 1/2 urine >>  ANTIBIOTICS: 1/2 clinda >> 1/2 zosyn >> 1/3 vanc >>  SIGNIFICANT EVENTS:    LEVEL OF CARE:  ICU PRIMARY SERVICE:  PCCM CONSULTANTS:  Vascular (Dr. Darrick Penna) CODE STATUS: full DIET:  npo DVT Px:  SCD's GI Px:  n/a  INTERVAL HISTORY:  Denies neck pain.  Still has pain in Rt arm.  Breathing better.  VITAL SIGNS: Temp:  [97.3 F (36.3 C)-99.6 F (37.6 C)] 98.1 F (36.7 C) (01/03 0800) Pulse Rate:  [36-121] 99  (01/03 1000) Resp:  [18-46] 33  (01/03 1000) BP: (65-149)/(23-127) 128/111 mmHg (01/03 1000) SpO2:  [89 %-100 %] 100 % (01/03 1000) Weight:  [151 lb 14.4 oz (68.9 kg)-162 lb 7.7 oz (73.7 kg)] 162 lb 7.7 oz (73.7 kg) (01/03 0630) HEMODYNAMICS: CVP:  [3 mmHg] 3 mmHg VENTILATOR SETTINGS:   INTAKE / OUTPUT: Intake/Output      01/02 0701 - 01/03 0700 01/03 0701 - 01/04 0700   I.V. (mL/kg) 100 (1.4) 543.8 (7.4)   IV Piggyback 1250    Total Intake(mL/kg) 1350 (18.3) 543.8 (7.4)   Urine (mL/kg/hr) 375 (0.2) 400   Total Output 375 400   Net +975 +143.8          PHYSICAL EXAMINATION:  Gen: chronically ill appearing, no acute distress HEENT: NCAT, PERRL, PULM: CTA B CV: RRR, S4 noted, no JVD AB: BS+, soft, nontender, no hsm Ext: L foot cool, cyanotic, large anterior ulcer with desquamation; R hand warm but tight skin throughout up to elbow, no sensation in hand Derm: no rash or skin breakdown Neuro: Awake and alert but confused, slurred speech, follows commands, limited motion R  arm    LABS: Cbc  Lab 06/30/12 0609 06/29/12 1420  WBC 8.9 --  HGB 11.8* 13.4  HCT 33.6* 37.1*  PLT 111* 169    Chemistry   Lab 06/30/12 0609 06/29/12 1420  NA 139 127*  K 3.6 2.8*  CL 108 89*  CO2 14* 18*  BUN 48* 59*  CREATININE 1.44* 1.94*  CALCIUM 6.6* 8.0*  MG -- 2.6*  PHOS -- --  GLUCOSE 100* 300*    Liver fxn  Lab 06/30/12 0609 06/29/12 1355  AST 66* 34  ALT 12 10  ALKPHOS 46 105  BILITOT 1.2 0.8  PROT 4.7* 6.0  ALBUMIN 1.2* 1.9*   coags  Lab 06/29/12 1420  APTT --  INR 1.41   Sepsis markers  Lab 06/30/12 0609 06/29/12 1421 06/29/12 1355  LATICACIDVEN 3.2* 6.7* --  PROCALCITON 45.10 -- 39.83   Cardiac markers  Lab 06/30/12 0609  CKTOTAL --  CKMB --  TROPONINI <0.30   BNP No results found for this basename: PROBNP:3 in the last 168 hours ABG  Lab 06/30/12 0700  PHART 7.286*  PCO2ART 29.5*  PO2ART 201.0*  HCO3 13.6*  TCO2 14.5    CBG trend  Lab 06/30/12 0324 06/30/12 0219 06/30/12 0115 06/30/12 0012 06/29/12 2246  GLUCAP 85 88 86 104*  124*    IMAGING:  Dg Chest 2 View  06/29/2012  *RADIOLOGY REPORT*  Clinical Data: 62 year old male with weakness and swelling.  CHEST - 2 VIEW  Comparison: 10/28/2009 and prior chest radiographs  Findings: The cardiomediastinal silhouette is unremarkable. Mild peribronchial thickening is stable. There is no evidence of focal airspace disease, pulmonary edema, suspicious pulmonary nodule/mass, pleural effusion, or pneumothorax. No acute bony abnormalities are identified.  IMPRESSION: No evidence of active cardiopulmonary disease.   Original Report Authenticated By: Harmon Pier, M.D.    Dg Shoulder Right  06/29/2012  *RADIOLOGY REPORT*  Clinical Data: 62 year old male with right shoulder pain and swelling.  RIGHT SHOULDER - 2+ VIEW  Comparison: 10/28/2009 chest radiograph  Findings: There is no evidence of acute bony abnormality. There is no evidence of acute fracture, subluxation, or dislocation. No  focal bony lesions are identified. The visualized right hemithorax is unremarkable.  Moderate degenerative changes at the [facility] joint noted. Decreased acromiohumeral space may represent a chronic rotator cuff tear.  IMPRESSION:  No evidence of acute bony abnormality.  Moderate AC joint degenerative changes and possible chronic rotator cuff tear.   Original Report Authenticated By: Harmon Pier, M.D.    Dg Forearm Right  06/29/2012  *RADIOLOGY REPORT*  Clinical Data: Right forearm pain and swelling.  RIGHT FOREARM - 2 VIEW  Comparison: None  Findings: No evidence of acute fracture, subluxation or dislocation identified.  No radio-opaque foreign bodies are present.  No focal bony lesions are noted.  The joint spaces are unremarkable.  Vascular calcifications are noted.  IMPRESSION: No evidence of acute bony abnormality.   Original Report Authenticated By: Harmon Pier, M.D.    Dg Chest Portable 1 View  06/30/2012  *RADIOLOGY REPORT*  Clinical Data: Central line placement.  PORTABLE CHEST - 1 VIEW  Comparison: Chest radiograph performed 06/29/2012  Findings: The lungs are well-aerated.  Mild right basilar opacity may reflect atelectasis or possibly pneumonia.  There is no evidence of pleural effusion or pneumothorax.  The cardiomediastinal silhouette is within normal limits.  No acute osseous abnormalities are seen.  A right IJ line is noted ending about the mid SVC.  IMPRESSION:  1.  Right IJ line noted ending about the mid SVC. 2.  Mild right basilar airspace opacity may reflect atelectasis or possibly pneumonia.   Original Report Authenticated By: Tonia Ghent, M.D.    Dg Foot Complete Left  06/29/2012  *RADIOLOGY REPORT*  Clinical Data: 62 year old male with left foot pain and wounds on left foot.  LEFT FOOT - COMPLETE 3+ VIEW  Comparison: None  Findings: Soft tissue gas and swelling is identified at the level of the lateral MTP joints. There is no evidence of acute fracture, subluxation or dislocation. The  Lisfranc joints are intact. No focal bony lesions are present except for a large plantar calcaneal spur. Vascular calcifications are present.  IMPRESSION: Soft tissue gas and swelling overlying the distal foot suspicious either for open wound or gas forming infection.  No radiographic evidence of osteomyelitis.  No acute bony abnormalities identified.  Calcaneal spur and vascular calcifications.   Original Report Authenticated By: Harmon Pier, M.D.     ASSESSMENT / PLAN:  PULMONARY  ASSESSMENT: 1) Tachypnea in setting of sepsis and aggressive volume resuscitation. PLAN:   -f/u ABG -O2 as needed  CARDIOVASCULAR  ASSESSMENT:  1) Septic shock due to foot gangrene 2) DM 3) R arm swelling, R hand numbness worrisome for compartment syndrome. Felt not to be compartment syndrome by ortho  1/03. PLAN:  -Sepsis protocol with EGDT -CVP monitoring -dopplers of R hand now  RENAL  ASSESSMENT:   1) AKI? Uncertain baseline. Improved with volume resuscitation. 2) Metabolic acidosis. PLAN:   Continue IV fluid Monitor renal fx, urine outpt, electrolytes Add HCO3 to IV fluid  GASTROINTESTINAL  ASSESSMENT:   1) Nutrition. PLAN:   -npo for surgery  HEMATOLOGIC  ASSESSMENT:   1) Thrombocytopenia 2nd to sepsis. PLAN:  -f/u CBC  INFECTIOUS  ASSESSMENT:   1) Septic shock due to gangrene. PLAN:   -amputation planned 1/03 -vanc/zosyn/clinda -f/u cultures  ENDOCRINE  ASSESSMENT:   1) DM, with hyperglycemia. PLAN:   -ICU hyperglycemia protocol  -insulin gtt as ordered  NEUROLOGIC  ASSESSMENT:   1) Encephalopathy, mild due to sepsis  PLAN:   -frequent orientation -limit sedating medications  I have personally obtained a history, examined the patient, evaluated laboratory and imaging results, formulated the assessment and plan and placed orders. CRITICAL CARE: The patient is critically ill with multiple organ systems failure and requires high complexity decision making for  assessment and support, frequent evaluation and titration of therapies, application of advanced monitoring technologies and extensive interpretation of multiple databases. Critical Care Time devoted to patient care services described in this note is 40 minutes.   Updated family at bedside, and d/w Dr. Darrick Penna.  Coralyn Helling, MD [facility] Pulmonary/Critical Care 06/30/2012, 10:41 AM Pager:  [phone] After 3pm call: [phone]

## 2012-06-30 NOTE — Progress Notes (Signed)
eLink Physician-Brief Progress Note Patient Name: Tyler Erickson DOB: 1950-11-28 MRN: [id number]  Date of Service  06/30/2012   HPI/Events of Note  Continued hypotension with current BP of 85/36 with HR of 105 despite 5.5 liters of NS.  Is scheduled for surgery this AM to amputate left leg.    eICU Interventions  Plan: Insert aline for HD monitoring 1 additional liter of NS IV for BP support Consider addition of NEO for BP support  PCCM consult   Intervention Category Intermediate Interventions: Hypotension - evaluation and management  Clevie Prout 06/30/2012, 4:44 AM

## 2012-06-30 NOTE — Op Note (Signed)
06/29/2012 - 06/30/2012  6:10 PM  PATIENT:  Tyler Erickson    PRE-OPERATIVE DIAGNOSIS:  Right upper extremity swelling  POST-OPERATIVE DIAGNOSIS:  Same  PROCEDURE:  Compartment measurements, right upper extremity, volar and dorsal forearm  SURGEON:  Eulas Post, MD  ANESTHESIA:   General  PREOPERATIVE INDICATIONS:  Tyler Erickson is a  62 y.o. male who has had swelling and poor circulation to his right upper extremity. Please see the preoperative notes for additional information. There was question raised regarding the possibility of compartment syndrome, and so measurement of compartment pressures was indicated.  OPERATIVE FINDINGS: The dorsal compartment pressure was 25, and the volar was 20, with a diastolic of 55.  OPERATIVE PROCEDURE: The patient was in the supine position while Dr. Darrick Penna was operating. The right upper extremity was prepped with Betadine and the Stryker compartment measurement kit was used to measure the pressures on the volar and dorsal side. Findings were as above. I did not feel that this was consistent with compartment syndrome, and the physiologic insult of fasciotomy was not warranted given his extreme condition, and not indicated based on stryker measurements.Marland Kitchen

## 2012-06-30 NOTE — Anesthesia Preprocedure Evaluation (Signed)
Anesthesia Evaluation  Patient identified by MRN, date of birth, ID band Patient awake  General Assessment Comment:Septic shock and on pressors  Reviewed: Allergy & Precautions, H&P , NPO status , Patient's Chart, lab work & pertinent test results  Airway Mallampati: II  Neck ROM: Full    Dental  (+) Loose and Poor Dentition   Pulmonary shortness of breath,    + decreased breath sounds      Cardiovascular Rhythm:Regular Rate:Tachycardia     Neuro/Psych  Neuromuscular disease    GI/Hepatic GERD-  ,Hx of colon ca   Endo/Other  diabetes  Renal/GU ARFRenal disease     Musculoskeletal   Abdominal (+) + obese,   Peds  Hematology   Anesthesia Other Findings   Reproductive/Obstetrics                           Anesthesia Physical Anesthesia Plan  ASA: IV  Anesthesia Plan: General   Post-op Pain Management:    Induction: Intravenous  Airway Management Planned: Oral ETT  Additional Equipment:   Intra-op Plan:   Post-operative Plan: Extubation in OR  Informed Consent:   Dental advisory given  Plan Discussed with: CRNA and Surgeon  Anesthesia Plan Comments:         Anesthesia Quick Evaluation

## 2012-06-30 NOTE — Progress Notes (Signed)
Inderal 2.5 mg IVPgiven per order of dr. Jacklynn Bue at [phone]

## 2012-06-30 NOTE — OR Nursing (Signed)
5 Fr. Sheath to right groin flushed at 1446 with 10mL of 6,000 units Heparin/577mL NaCl.

## 2012-06-30 NOTE — Progress Notes (Signed)
Dr. Butler Denmark visited at bedside

## 2012-06-30 NOTE — Progress Notes (Signed)
ambued at 100 %

## 2012-06-30 NOTE — Progress Notes (Signed)
eLink Physician-Brief Progress Note Patient Name: Tyler Erickson DOB: 1950/12/09 MRN: [id number]  Date of Service  06/30/2012   HPI/Events of Note  Electrolyte abnormalities in the setting of AoCRI.  Hypokalemia.  Corrected calcium is WNL at 8.6 mg/dl  eICU Interventions  Plan: Order for total potassium replacement IV of 60 mEq   Intervention Category Intermediate Interventions: Electrolyte abnormality - evaluation and management  DETERDING,ELIZABETH 06/30/2012, 11:26 PM

## 2012-06-30 NOTE — Progress Notes (Addendum)
[facility] [place]  Subjective  - Septic overnight, now requiring levophed for BP support  Objective 131/70 96 98.1 F (36.7 C) (Oral) 30 100%  Intake/Output Summary (Last 24 hours) at 06/30/12 0958 Last data filed at 06/30/12 0900  Gross per 24 hour  Intake 1893.8 ml  Output    375 ml  Net 1518.8 ml   Right upper extremity diffuse swelling from shoulder to hand.  His fingers are now dusky compared to pink last evening.  He also has more numbness.  He is not able to move the arm much except some at the digits.  Doppler radial and ulnar and brachial bilaterally.  Right and left hands cool but right cooler and more dusky than right  Left foot unchanged wet gangrene  Assessment/Planning: Progressive ischemic change of right hand overnight, unsure of etiology ? Pressors will get stat arterial and venous duplex to rule out arterial embolus or DVT.  Unable to move right upper extremity much peripheral vs central nerve issue.  Needs further eval.  Discussed with critical care who will evaluate to consider MRI/CT brain  Left foot wet gangrene amputation later today  Tyler Erickson E 06/30/2012 9:58 AM --  Laboratory Lab Results:  [facility] 06/30/12 0609 06/29/12 1420  WBC 8.9 18.5*  HGB 11.8* 13.4  HCT 33.6* 37.1*  PLT 111* 169   BMET  Basename 06/30/12 0609 06/29/12 1420  NA 139 127*  K 3.6 2.8*  CL 108 89*  CO2 14* 18*  GLUCOSE 100* 300*  BUN 48* 59*  CREATININE 1.44* 1.94*  CALCIUM 6.6* 8.0*    COAG Lab Results  Component Value Date   INR 1.41 06/29/2012   INR 1.07 10/28/2009   INR 1.0 08/07/2008   No results found for this basename: PTT    Antibiotics Anti-infectives     Start     Dose/Rate Route Frequency Ordered Stop   06/30/12 0645   piperacillin-tazobactam (ZOSYN) IVPB 3.375 g  Status:  Discontinued        3.375 g 100 mL/hr over 30 Minutes Intravenous  Once 06/30/12 0630 06/30/12 0633   06/30/12 0400   vancomycin (VANCOCIN) 500  mg in sodium chloride 0.9 % 100 mL IVPB        500 mg 100 mL/hr over 60 Minutes Intravenous Every 12 hours 06/29/12 1912     06/29/12 2200  piperacillin-tazobactam (ZOSYN) IVPB 3.375 g       3.375 g 12.5 mL/hr over 240 Minutes Intravenous 3 times per day 06/29/12 1912     06/29/12 1900   clindamycin (CLEOCIN) IVPB 600 mg        600 mg 100 mL/hr over 30 Minutes Intravenous 4 times per day 06/29/12 1847     06/29/12 1500   vancomycin (VANCOCIN) IVPB 1000 mg/200 mL premix        1,000 mg 200 mL/hr over 60 Minutes Intravenous  Once 06/29/12 1454 06/29/12 1752   06/29/12 1500   piperacillin-tazobactam (ZOSYN) IVPB 4.5 g  Status:  Discontinued        4.5 g 200 mL/hr over 30 Minutes Intravenous  Once 06/29/12 1454 06/29/12 1459   06/29/12 1500  piperacillin-tazobactam (ZOSYN) IVPB 3.375 g       3.375 g 100 mL/hr over 30 Minutes Intravenous  Once 06/29/12 1459 06/29/12 1752

## 2012-06-30 NOTE — Op Note (Addendum)
Procedure: Arch aortogram with right upper extremity arteriogram         Left Below knee amputation Preoperative diagnosis: Gangrene left foot, ischemia right hand Postoperative diagnosis: Same  Anesthesia: General  Asst: Della Goo, PA-C  Operative findings: #1 Normal arch anatomy, diffusely atherosclerotic right upper extremity arterial tree unreconstructable          #2 120 cc total contrast           #3 right femoral artery 5 Fr sheath left in place for temporary a line and will need to be removed tomorrow    Operative details: After obtaining informed consent, the patient was taken to the operating room. The patient was placed in supine position the OR table. Both groins were prepped and draped in usual sterile fashion.  An introducer needle was placed into the right common femoral artery and there was good backbleeding from this. Next and 0.035 Versacore wire was threaded up into the abdominal aorta and into the aortic arch under fluoroscopic guidance. A 5 French sheath was placed over the guidewire into the right common femoral artery. This was thoroughly flushed with heparinized saline. A 5 French pigtail catheter was then placed through the guidewire advanced up in the aortic arch and arch aortogram obtained in a 40 LAO view. This shows normal arch configuration with patent, right subclavian, right innominate, right common carotid, left common carotid, left subclavian, bilateral vertebral arteries.  The 5 French pigtail catheter was then pulled back over the guidewire and exchanged for an H1 catheter. This was then used to selectively catheterize the innominate artery Followed by the right subclavian artery. Injection was then performed through the subclavian artery. Projections were done in AP projection. The right subclavian is patent the right axillary is diffusely diseased with a focal 90% stenosis at the level of the axilla, the right brachial is patent, right ulnar  Is diffusely  diseased and flow is not really seen below the wrist.  The right radial and interosseuos is occluded.  These all appear chronic. arteries are all patent. There is calcification of the digital vessels but they are poorly opacified due to dilution.  Next, the H1 catheter was pulled back over the guidewire and out. The 5 French sheath was thoroughly flushed with heparinized saline. It was sutured to the skin.   Next, the patient's entire left lower extremity was prepped and draped all the way down to the level of the ankle. Next a transverse incision was made approximately 4 fingerbreadths below the tibial tuberosity on the left leg.  The  incision was carried posteriorly to the midportion of the leg and then extended longitudinally to create a posterior flap. The subcutaneous tissues and fascia was taken down with cautery. Periosteum was raised on the tibia approximately 5 cm above the skin edge.  The periosteum was also raised on the fibula several centimeters above this. The tibia was then divided with a saw. The fibula was divided with a bone cutter. The leg was then elevated in the operative field and the amputation was completed posterior to the bones with an amputation knife. Hemostasis was then obtained with cautery and several suture ligatures. The tibial and sural nerves were pulled down into the field transected and allowed to retract up into the leg.  After hemostasis was obtained, the wound was thoroughly irrigated with  normal saline solution. The fascial edges were then reapproximated with interrupted 2-0 Vicryl sutures. Subcutaneous tissues were reapproximated using running 3-0 Vicryl suture. The  skin was closed with staples. The patient tolerated the procedure well and there were no complications. Instrument sponge and  needle counts were correct at the end of the case. The patient was taken to the recovery room in stable condition.  Fabienne Bruns, MD [facility]  [place] Office: [phone] Pager: [phone]  Addendum: Upon arrival to the PACU the patient had a brief respiratory arrest and was reintubated.  Brief CPR and 1 round of Epi.  Critical care team and family updated.  He is currently stable on Levophed as preop with some tachycardia and hypertension from Epi.  Will need head CT to rule out stroke as source of right arm paralysis.  Fabienne Bruns, MD [facility] [place] Office: [phone] Pager: [phone]

## 2012-06-30 NOTE — OR Nursing (Signed)
Right upper extremity aortogram and arteriogram started at 1344. Compartment measurement of right forearm performed by Dr. Dion Saucier; started at 1405 and ended at 1406. Left below the knee amputation started at 1445.

## 2012-06-30 NOTE — Progress Notes (Signed)
Orthopedic Tech Progress Note Patient Details:  [name] April 01, 1951 [id number]  Ortho Devices Type of Ortho Device: Arm sling;Other (comment) Ortho Device/Splint Location: right UE Ortho Device/Splint Interventions: Application Applied mission sling. Tyler Erickson T 06/30/2012, 9:42 AM

## 2012-06-30 NOTE — Consult Note (Signed)
PULMONARY  / CRITICAL CARE MEDICINE  Name: Tyler Erickson MRN: [id number] DOB: 1951/05/28    LOS: 1  REFERRING MD :  TRH  CHIEF COMPLAINT:  Foot gangrene, shock  BRIEF PATIENT DESCRIPTION: 62 y/o male with DM, stage III colon cancer who presented to the [facility] ED on 1/2 with left foot pain and chills.  He was found to have foot gangrene and developed septic shock within 24 hours of admission.  LINES / TUBES: 1/3 R IJ CVL >>  CULTURES: 1/2 blood >> 1/2 urine >>  ANTIBIOTICS: 1/2 clinda >> 1/2 zosyn >> 1/3 vanc >>  SIGNIFICANT EVENTS:    LEVEL OF CARE:  ICU PRIMARY SERVICE:  PCCM CONSULTANTS:  Vascular (Dr. Darrick Penna) CODE STATUS: full DIET:  npo DVT Px:  SCD's GI Px:  n/a  HISTORY OF PRESENT ILLNESS:  62 y/o male with DM, stage III colon cancer who presented to the [facility] ED on 1/2 with left foot pain and chills.  He has not seen a physician in over two years and has not been checking his blood sugar either.  He was admitted to the [facility] service on 1/2 and treated with broad spectrum antibiotics while awaiting surgery on 1/3 (amputation).  Overnight on 1/2 through the early AM of 1/3 he developed worsening hypotension despite aggressive IVF.    PAST MEDICAL HISTORY :  Past Medical History  Diagnosis Date  . ADENOCARCINOMA, COLON     S/P surgery/chemotherapy  . DM   . NEUROPATHY   . Kidney stones   . Large bowel obstruction   . Ischemic colon    Past Surgical History  Procedure Date  . 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 Ileostomy closure 10/31/2009  . Porta-cath insertion 08/08/2008   Prior to Admission medications   Medication Sig Start Date End Date Taking? Authorizing Provider  HYDROcodone-acetaminophen (NORCO/VICODIN) 5-325 MG per tablet Take 2 tablets by mouth every 6 (six) hours as needed. For pain   Yes Historical Provider, MD   No Known Allergies  FAMILY HISTORY:  Family History  Problem Relation Age of Onset  . Diabetes Mother   . Diabetes Father     . Heart failure Mother   . Heart failure Father   . Stroke Father   . Diabetes Sister   . Diabetes Sister   . Diabetes Brother    SOCIAL HISTORY:  reports that he has been smoking Cigars.  He has never used smokeless tobacco. He reports that he does not drink alcohol or use illicit drugs.  REVIEW OF SYSTEMS:   Cannot obtain due to confusion   INTERVAL HISTORY:   VITAL SIGNS: Temp:  [97.3 F (36.3 C)-99.6 F (37.6 C)] 97.3 F (36.3 C) (01/03 0400) Pulse Rate:  [36-121] 39  (01/03 0100) Resp:  [18-46] 31  (01/03 0500) BP: (65-149)/(23-127) 83/43 mmHg (01/03 0500) SpO2:  [89 %-100 %] 100 % (01/03 0400) Weight:  [68.9 kg (151 lb 14.4 oz)] 68.9 kg (151 lb 14.4 oz) (01/02 1928) HEMODYNAMICS:   VENTILATOR SETTINGS:   INTAKE / OUTPUT: Intake/Output      01/02 0701 - 01/03 0700   IV Piggyback 1000   Total Intake(mL/kg) 1000 (14.5)   Urine (mL/kg/hr) 375 (0.2)   Total Output 375   Net +625         PHYSICAL EXAMINATION:  Gen: chronically ill appearing, no acute distress HEENT: NCAT, PERRL, PULM: CTA B CV: RRR, S4 noted, no JVD AB: BS+, soft, nontender, no hsm Ext: L  foot cool, cyanotic, large anterior ulcer with desquamation; R hand warm but tight skin throughout up to elbow, no sensation in hand Derm: no rash or skin breakdown Neuro: Awake and alert but confused, slurred speech, follows commands, limited motion R arm    LABS: Cbc  Lab 06/29/12 1420  WBC 18.5*  HGB 13.4  HCT 37.1*  PLT 169    Chemistry   Lab 06/29/12 1420  NA 127*  K 2.8*  CL 89*  CO2 18*  BUN 59*  CREATININE 1.94*  CALCIUM 8.0*  MG 2.6*  PHOS --  GLUCOSE 300*    Liver fxn  Lab 06/29/12 1355  AST 34  ALT 10  ALKPHOS 105  BILITOT 0.8  PROT 6.0  ALBUMIN 1.9*   coags  Lab 06/29/12 1420  APTT --  INR 1.41   Sepsis markers  Lab 06/29/12 1421 06/29/12 1355  LATICACIDVEN 6.7* --  PROCALCITON -- 39.83   Cardiac markers No results found for this basename:  CKTOTAL:3,CKMB:3,TROPONINI:3 in the last 168 hours BNP No results found for this basename: PROBNP:3 in the last 168 hours ABG No results found for this basename: PHART:3,PCO2ART:3,PO2ART:3,HCO3:3,TCO2:3 in the last 168 hours  CBG trend  Lab 06/30/12 0219 06/30/12 0115 06/30/12 0012 06/29/12 2246 06/29/12 2132  GLUCAP 88 86 104* 124* 198*    IMAGING:   ECG: NSR with PAC's, LAD, no ST wave changes  DIAGNOSES: Principal Problem:  *Gangrene of foot Active Problems:  ADENOCARCINOMA, COLON  DM  NEUROPATHY  Hyponatremia  Hypokalemia  Metabolic acidosis  AKI (acute kidney injury)  Leukocytosis  Diabetic foot ulcer  Decreased range of motion of shoulder  Septic shock(785.52)   ASSESSMENT / PLAN:  PULMONARY  ASSESSMENT: 1) Tachypnea in setting of sepsis and aggressive volume resuscitation. PLAN:   -check CXR -check ABG -O2 as needed  CARDIOVASCULAR  ASSESSMENT:  1) Septic shock due to foot gangrene 2) DM 3) R arm swelling, R hand numbness worrisome for compartment syndrome, apparently this is new overnight?  No report of this on admission, he says that his hand has been swelling for 48 hours PLAN:  -Sepsis protocol with EGDT -CVP monitoring -check 12 lead  -ortho consult now to evaluate R hand, measure compartment pressures -dopplers of R hand now -remove IV from hand now  RENAL  ASSESSMENT:   1) AKI? Uncertain baseline PLAN:   -repeat BMET -IVF -Follow uop  GASTROINTESTINAL  ASSESSMENT:   1) No acute issues PLAN:   -npo for surgery  HEMATOLOGIC  ASSESSMENT:   1) No acute issues PLAN:  -monitor for bleeding  INFECTIOUS  ASSESSMENT:   1) Septic shock due to gangrene PLAN:   -amputation planned today -vanc/zosyn/clinda -f/u cultures  ENDOCRINE  ASSESSMENT:   1) DM, better controlled and now  PLAN:   -ICU hyperglycemia protocol  -insulin gtt as ordered  NEUROLOGIC  ASSESSMENT:   1) Encephalopathy, mild due to sepsis  PLAN:     -frequent orientation -limit sedating medications  CLINICAL SUMMARY: 62 y/o male with septic shock from L foot gangrene. To OR 1/3 for amputation.  I have personally obtained a history, examined the patient, evaluated laboratory and imaging results, formulated the assessment and plan and placed orders. CRITICAL CARE: The patient is critically ill with multiple organ systems failure and requires high complexity decision making for assessment and support, frequent evaluation and titration of therapies, application of advanced monitoring technologies and extensive interpretation of multiple databases. Critical Care Time devoted to patient care services described  in this note is 45 minutes.   Fonnie Jarvis Pulmonary and Critical Care Medicine [facility] Pager: [phone]  06/30/2012, 5:58 AM

## 2012-06-30 NOTE — Progress Notes (Signed)
Patient's right arm grossly swollen cold no capillary  refill doppled brachial pulse ., right foot cold doppled pulse foot pale and > 3 secs capillary refill. Left arm and hand cold and swollen doppled pulse LBKa compression dressin gdry and intact elevated on pillow

## 2012-06-30 NOTE — Progress Notes (Signed)
Compartment pressures were measured in the dorsal and volar aspect of the right forearm. These measured 25 mm of mercury on the dorsum, and 20 mm of mercury on the volar aspect. His diastolic pressure was between 55 and 60. His compartments felt soft, although the forearm itself was somewhat mottled. The procedure was performed while Dr. Darrick Penna was performing the arteriogram. A sterile prep with Betadine was performed, and a Stryker compartment measurement kit utilized.  I did not feel that these compartment pressures were indicative of acute compartment syndrome, and I felt that fasciotomies were not indicated, and would be more harmful than helpful at this point.  Eulas Post, MD

## 2012-06-30 NOTE — Progress Notes (Signed)
RT tried to get aline twice and was unsuccessful, while patient was in 3300. Patient transferred to 2900 shortly after.

## 2012-06-30 NOTE — Progress Notes (Signed)
eLink Physician-Brief Progress Note Patient Name: Tyler Erickson DOB: Nov 22, 1950 MRN: [id number]  Date of Service  06/30/2012   HPI/Events of Note  Patient with ongoing hypotension and tachycardia in the setting of LE infection.  Has received approx 3 liters of IVFs.   eICU Interventions  Plan: Order for additional liter of NS for BP support in the setting of sepsis.   Intervention Category Intermediate Interventions: Hypotension - evaluation and management;Infection - evaluation and management  [name] 06/30/2012, 1:15 AM

## 2012-06-30 NOTE — Anesthesia Postprocedure Evaluation (Signed)
  Anesthesia Post-op Note  Patient: Tyler Erickson  Procedure(s) Performed: Procedure(s) (LRB) with comments: AMPUTATION BELOW KNEE (Left) INTRA OPERATIVE ARTERIOGRAM (Right) AORTOGRAM (Right) - Right upper extremity arch  COMPARTMENT MEASUREMENT (Right)  Patient Location: PACU  Anesthesia Type:General  Level of Consciousness: sedated and unresponsive  Airway and Oxygen Therapy: Patient re-intubated  Post-op Pain: none  Post-op Assessment: Post-op Vital signs reviewed, PATIENT'S CARDIOVASCULAR STATUS UNSTABLE and RESPIRATORY FUNCTION UNSTABLE  Post-op Vital Signs: unstable  Complications: Patient re-intubated

## 2012-06-30 NOTE — Care Management Note (Addendum)
    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   (If response is "NO", the following Medicare IM given date fields will be blank) Date Medicare IM given:   Date Additional Medicare IM given:    Discharge Disposition:  SKILLED NURSING FACILITY  Per UR Regulation:  Reviewed for med. necessity/level of care/duration of stay  If discussed at Long Length of Stay Meetings, dates discussed:   07/06/2012  07/11/2012    Comments:  07/05/12- 1600- Donn Pierini RN, BSN [phone] CIR screen ordered for possible consult- Pt had TEE done today- and will need long term abx- will need PICC placed for d/c- NCM to cont. to follow for d/c planning   06/30/12 0839 debbie dowell rn,bsn

## 2012-06-30 NOTE — Progress Notes (Signed)
Pt admitted to room 3316. VSS. 98.1, 112/67, 109, 40, 96% on room air. Placed on 2L nasal cannula. Pt c/o pain in R arm.  Elink notified about transfer. Pt's sisters are at the bedside. Pt is NPO and consent is signed for L BKA in AM. Will continue to monitor.

## 2012-06-30 NOTE — Progress Notes (Signed)
Pt placed on ventialtor

## 2012-06-30 NOTE — Progress Notes (Signed)
Pt's BP 70-90s/40-50s HR low 100s-130s ST.  Notified Rapid Response, Schorr, Elink, and Dr. Darrick Penna multiple times. Pt has received a total of 6.5 L NS boluses (he received 1 L at [facility] ED). Orders received to insert arterial line and transfer pt to 2906 for closer monitoring and BP support. Pt's sister, Ms. Kateri Plummer, updated and informed about transfer. Will continue to monitor.

## 2012-06-30 NOTE — Progress Notes (Signed)
Increased levophed at 10 mcg/m

## 2012-06-30 NOTE — Progress Notes (Signed)
arousable follows simple commands squeezes left hand slightly and moves right footslightlhy

## 2012-06-30 NOTE — Consult Note (Signed)
  I was asked to see the patient by Dr. Dion Saucier earlier in the day. I discussed the patient with Dr. Dion Saucier and Dr. Darrick Penna. Dr. Darrick Penna has subsequently performed a arteriogram and Dr. Dion Saucier has performed a pressure measurement of the right forearm. The patient unfortunately had a brief arrest in the PACU and is now intubated. He does follow commands. I have reviewed his arms and the findings. The patient certainly is a very sick gentleman. Dr. Darrick Penna has performed lower extremity surgery and has performed the arteriogram.   Results of the of her arteriogram are reviewed from the notes by Dr. Darrick Penna.  The patient's arms are cool to touch about the hands. He can make a fist (60%) on the left side. The right side has absent motor function. Sensory examination is difficult as the patient is intubated. His compartments feel soft I do not feel any rockhard compartments or evidence of compartment syndrome. He appears to have some generalized third spacing of fluid and this is noted on his exam.  I do not see any hard signs of infection or any hard signs of a compartment syndrome. I feel that the studies does far performed with substantiate this as well. A CT of the head is been ordered to elucidate if he has had a vascular infarct which could account for his loss of motor capabilities in the right arm.  Possible etiologies for the upper extremity predicament all are certainly the vascular disease in  itself, possible ischemic event, and one must consider the issue of the pressors that he is been on in terms of ischemic insult. He could also have thrown a clot to produce his predicament.  Certainly at this time no aggressive surgical avenues of care would be warranted as they pertain to a possible compartment syndrome or infection.  I do not have a lot to add based upon my exam and feel that Dr. Darrick Penna and Dion Saucier have done an excellent job.  Dominica Severin M.D.

## 2012-07-01 ENCOUNTER — Inpatient Hospital Stay (HOSPITAL_COMMUNITY): Payer: Medicare HMO

## 2012-07-01 LAB — COMPREHENSIVE METABOLIC PANEL
ALT: 46 U/L (ref 0–53)
AST: 154 U/L — ABNORMAL HIGH (ref 0–37)
Albumin: 1 g/dL — ABNORMAL LOW (ref 3.5–5.2)
Alkaline Phosphatase: 49 U/L (ref 39–117)
Potassium: 3.4 mEq/L — ABNORMAL LOW (ref 3.5–5.1)
Sodium: 140 mEq/L (ref 135–145)
Total Protein: 4.5 g/dL — ABNORMAL LOW (ref 6.0–8.3)

## 2012-07-01 LAB — GLUCOSE, CAPILLARY
Glucose-Capillary: 192 mg/dL — ABNORMAL HIGH (ref 70–99)
Glucose-Capillary: 201 mg/dL — ABNORMAL HIGH (ref 70–99)
Glucose-Capillary: 208 mg/dL — ABNORMAL HIGH (ref 70–99)

## 2012-07-01 LAB — PROCALCITONIN: Procalcitonin: 41.85 ng/mL

## 2012-07-01 LAB — CBC
HCT: 28.8 % — ABNORMAL LOW (ref 39.0–52.0)
MCV: 79.3 fL (ref 78.0–100.0)
RDW: 13.5 % (ref 11.5–15.5)
WBC: 9.7 10*3/uL (ref 4.0–10.5)

## 2012-07-01 LAB — URINE CULTURE: Culture: NO GROWTH

## 2012-07-01 MED ORDER — GLUCERNA 1.2 CAL PO LIQD
1000.0000 mL | ORAL | Status: DC
  Administered 2012-07-01: 1000 mL
  Filled 2012-07-01 (×4): qty 1000

## 2012-07-01 MED ORDER — IPRATROPIUM-ALBUTEROL 18-103 MCG/ACT IN AERO: 4.0000 | INHALATION_SPRAY | RESPIRATORY_TRACT | Status: DC | PRN

## 2012-07-01 MED ORDER — CHLORHEXIDINE GLUCONATE 0.12 % MT SOLN
15.0000 mL | Freq: Two times a day (BID) | OROMUCOSAL | Status: DC
  Administered 2012-07-01 – 2012-07-02 (×3): 15 mL via OROMUCOSAL
  Filled 2012-07-01 (×3): qty 15

## 2012-07-01 MED ORDER — SODIUM CHLORIDE 0.9 % IV SOLN
INTRAVENOUS | Status: DC
  Administered 2012-07-01: 11:00:00 via INTRAVENOUS
  Administered 2012-07-05: 100 mL/h via INTRAVENOUS
  Administered 2012-07-05 – 2012-07-06 (×3): via INTRAVENOUS
  Administered 2012-07-07: 10 mL/h via INTRAVENOUS
  Administered 2012-07-07 – 2012-07-09 (×2): via INTRAVENOUS
  Administered 2012-07-11: 20 mL/h via INTRAVENOUS

## 2012-07-01 MED ORDER — POTASSIUM CHLORIDE 20 MEQ/15ML (10%) PO LIQD
ORAL | Status: AC
  Filled 2012-07-01: qty 30

## 2012-07-01 MED ORDER — POTASSIUM CHLORIDE 20 MEQ/15ML (10%) PO LIQD
40.0000 meq | Freq: Once | ORAL | Status: AC
  Administered 2012-07-01: 40 meq
  Filled 2012-07-01: qty 30

## 2012-07-01 MED ORDER — SODIUM CHLORIDE 0.9 % IV SOLN
INTRAVENOUS | Status: DC
  Administered 2012-07-01: 05:00:00 via INTRAVENOUS

## 2012-07-01 MED ORDER — HEPARIN SODIUM (PORCINE) 5000 UNIT/ML IJ SOLN
5000.0000 [IU] | Freq: Three times a day (TID) | INTRAMUSCULAR | Status: DC
  Administered 2012-07-01 – 2012-07-11 (×31): 5000 [IU] via SUBCUTANEOUS
  Filled 2012-07-01 (×33): qty 1

## 2012-07-01 MED ORDER — SODIUM CHLORIDE 0.9 % IV BOLUS (SEPSIS)
500.0000 mL | Freq: Once | INTRAVENOUS | Status: AC
Start: 2012-07-01 — End: 2012-07-01
  Administered 2012-07-01: 500 mL via INTRAVENOUS

## 2012-07-01 MED ORDER — SODIUM CHLORIDE 0.9 % IV SOLN
INTRAVENOUS | Status: DC
  Administered 2012-07-01 – 2012-07-03 (×5): via INTRAVENOUS

## 2012-07-01 MED ORDER — BIOTENE DRY MOUTH MT LIQD
15.0000 mL | Freq: Four times a day (QID) | OROMUCOSAL | Status: DC
  Administered 2012-07-01 – 2012-07-03 (×6): 15 mL via OROMUCOSAL

## 2012-07-01 MED ORDER — PRO-STAT SUGAR FREE PO LIQD
30.0000 mL | Freq: Two times a day (BID) | ORAL | Status: DC
  Administered 2012-07-01 – 2012-07-02 (×3): 30 mL
  Filled 2012-07-01 (×4): qty 30

## 2012-07-01 MED ORDER — PANTOPRAZOLE SODIUM 40 MG IV SOLR
40.0000 mg | Freq: Every day | INTRAVENOUS | Status: DC
  Administered 2012-07-01 (×2): 40 mg via INTRAVENOUS
  Filled 2012-07-01 (×3): qty 40

## 2012-07-01 NOTE — Progress Notes (Signed)
eLink Physician-Brief Progress Note Patient Name: CHRISTIEN FRANKL DOB: 21-Mar-1951 MRN: [id number]  Date of Service  07/01/2012   HPI/Events of Note  Patient s/p surgery L BKA with presentation of sepsis syndrome - now intubated/sedated on pressor support.  Baldder scan shows 250 cc urine in bladder.  CVP of 6 with low UOP.   eICU Interventions  Plan: Insert foley catheter to straight drain. 500 cc bolus NS for low CVP on pressors with oliguria.   Intervention Category Intermediate Interventions: Hypotension - evaluation and management;Oliguria - evaluation and management  DETERDING,ELIZABETH 07/01/2012, 12:29 AM

## 2012-07-01 NOTE — Progress Notes (Signed)
62 yo with Strep group A gangrene; BC reviewed and pos 2/2 fro strep group A.   Currently on Zosyn and Clinda. No further intervention needed.

## 2012-07-01 NOTE — Progress Notes (Signed)
INITIAL NUTRITION ASSESSMENT  DOCUMENTATION CODES Per approved criteria  -Not Applicable   INTERVENTION:  Initiate TF via OG tube with Glucerna 1.2 at 25 ml/h, increase by 10 ml every 4 hours to goal rate of 55 ml/h with Prostat 30 ml BID to provide 1784 kcals, 109 gm protein, 1063 ml free water daily.  NUTRITION DIAGNOSIS: Inadequate oral intake related to inability to eat as evidenced by NPO status.   Goal: Intake to meet >90% of estimated nutrition needs.  Monitor:  TF tolerance/adequacy, weight trend, labs, I/O  Reason for Assessment: MD Consult for TF initiation and management.  62 y.o. male  Admitting Dx: Gangrene of foot  ASSESSMENT: Patient was admitted 06/29/2012 with chills and Lt foot pain from Lt foot gangrene and septic shock.  S/P left BKA, RUE arteriogram on 1/3.  Developed respiratory arrest, brief CPR, re-intubated in PACU.  Transferred to [facility] 1/3 with septic shock.    Patient is currently intubated on ventilator support. Received consult for TF initiation and management.  OG tube to be placed. MV: 10.3 Temp:Temp (24hrs), Avg:98.5 F (36.9 C), Min:97.7 F (36.5 C), Max:99.1 F (37.3 C)   Blood sugars are elevated, need good glycemic control to support wound healing.  Height: Ht Readings from Last 1 Encounters:  06/30/12 5\' 8"  (1.727 m)    Weight: Wt Readings from Last 1 Encounters:  07/01/12 162 lb 0.6 oz (73.5 kg)    Ideal Body Weight: 66 kg (adjusted for BKA)  % Ideal Body Weight: 111%  Wt Readings from Last 10 Encounters:  07/01/12 162 lb 0.6 oz (73.5 kg)  07/01/12 162 lb 0.6 oz (73.5 kg)  03/17/09 151 lb (68.493 kg)   Usual Body Weight: 151 lb  % Usual Body Weight: 107%  Weight is above usual weight despite recent BKA, likely related to fluid status.  BMI:  26.2  Estimated Nutritional Needs: Kcal: 1800 Protein: 100-120 gm Fluid: 1.8 - 2 L  Skin: diabetic ulcer on right foot; left BKA incision; right groin incision  Diet Order:   NPO  EDUCATION NEEDS: -Education not appropriate at this time   Intake/Output Summary (Last 24 hours) at 07/01/12 0908 Last data filed at 07/01/12 0700  Gross per 24 hour  Intake 4791.38 ml  Output    655 ml  Net 4136.38 ml    Last BM: 12/31   Labs:   Lab 07/01/12 0635 06/30/12 2201 06/30/12 0609 06/29/12 1420  NA 140 137 139 --  K 3.4* 3.2* 3.6 --  CL 107 104 108 --  CO2 20 15* 14* --  BUN 61* 55* 48* --  CREATININE 1.80* 1.67* 1.44* --  CALCIUM 6.2* 6.4* 6.6* --  MG 2.1 -- -- 2.6*  PHOS 1.9* -- -- --  GLUCOSE 214* 251* 100* --    CBG (last 3)   Basename 07/01/12 0834 07/01/12 0333 06/30/12 2340  GLUCAP 192* 208* 201*    Scheduled Meds:   . antiseptic oral rinse  15 mL Mouth Rinse QID  . chlorhexidine  15 mL Mouth Rinse BID  . clindamycin (CLEOCIN) IV  600 mg Intravenous Q6H  . heparin subcutaneous  5,000 Units Subcutaneous Q8H  . insulin aspart  0-9 Units Subcutaneous Q4H  . insulin glargine  10 Units Subcutaneous Daily  . pantoprazole (PROTONIX) IV  40 mg Intravenous QHS  . piperacillin-tazobactam (ZOSYN)  IV  3.375 g Intravenous Q8H  . potassium chloride  40 mEq Per Tube Once  . sodium chloride  3 mL Intravenous  Q12H  . vancomycin  500 mg Intravenous Q12H    Continuous Infusions:   . sodium chloride 20 mL/hr at 07/01/12 0457  . sodium chloride    . insulin (NOVOLIN-R) infusion Stopped (06/30/12 0012)  . norepinephrine (LEVOPHED) Adult infusion 7 mcg/min (07/01/12 0600)    Past Medical History  Diagnosis Date  . ADENOCARCINOMA, COLON     S/P surgery/chemotherapy  . DM   . NEUROPATHY   . Kidney stones   . Large bowel obstruction   . Ischemic colon     Past Surgical History  Procedure Date  . 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 Ileostomy closure 10/31/2009  . Porta-cath insertion 08/08/2008    Tyler Erickson, RD, LDN, CNSC Pager# [phone] After Hours Pager# [phone]

## 2012-07-01 NOTE — Progress Notes (Signed)
Dr. Craige Cotta called to inform of positive blood cultures.  Md states pt is appropriately covered with antibiotics.

## 2012-07-01 NOTE — Significant Event (Signed)
Group A strep in blood cultures.  Continue zosyn, clindamycin.  D/c Vancomycin.  Coralyn Helling, MD [facility] Pulmonary/Critical Care 07/01/2012, 12:07 PM Pager:  [phone] After 3pm call: [phone]

## 2012-07-01 NOTE — Progress Notes (Signed)
eLink Physician-Brief Progress Note Patient Name: Tyler Erickson DOB: 1951-04-23 MRN: [id number]  Date of Service  07/01/2012   HPI/Events of Note  Best Practice   eICU Interventions  On vent - placed on protonix for stress ulcer propy   Intervention Category Intermediate Interventions: Best-practice therapies (e.g. DVT, beta blocker, etc.)  DETERDING,ELIZABETH 07/01/2012, 1:14 AM

## 2012-07-01 NOTE — Progress Notes (Signed)
Subjective: Interval History: none..   Objective: Vital signs in last 24 hours: Temp:  [97.7 F (36.5 C)-99.1 F (37.3 C)] 99.1 F (37.3 C) (01/04 0400) Pulse Rate:  [65-173] 104  (01/04 0700) Resp:  [15-47] 21  (01/04 0700) BP: (55-148)/(18-111) 88/46 mmHg (01/04 0326) SpO2:  [87 %-100 %] 100 % (01/04 0700) Arterial Line BP: (93-173)/(43-65) 121/60 mmHg (01/04 0700) FiO2 (%):  [40 %-100 %] 40 % (01/04 0906) Weight:  [162 lb 0.6 oz (73.5 kg)] 162 lb 0.6 oz (73.5 kg) (01/04 0500)  Intake/Output from previous day: 01/03 0701 - 01/04 0700 In: 5335.2 [I.V.:4000.2; IV Piggyback:1335] Out: 1055 [Urine:1055] Intake/Output this shift:    Physical exam: Follows commands on the vent. Left below-knee amputation dressing intact. Cool hands bilaterally. Able to move the fingers of left hand. No motor function on the right which is no change from yesterday. He does have ulnar and radial signal at his wrist on the right. The right ulnar signal is stronger than his left ulnar signal.  Lab Results:  [facility] 07/01/12 0635 06/30/12 0609  WBC 9.7 8.9  HGB 10.2* 11.8*  HCT 28.8* 33.6*  PLT 113* 111*   BMET  Basename 07/01/12 0635 06/30/12 2201  NA 140 137  K 3.4* 3.2*  CL 107 104  CO2 20 15*  GLUCOSE 214* 251*  BUN 61* 55*  CREATININE 1.80* 1.67*  CALCIUM 6.2* 6.4*    Studies/Results: Dg Chest 2 View  06/29/2012  *RADIOLOGY REPORT*  Clinical Data: 62 year old male with weakness and swelling.  CHEST - 2 VIEW  Comparison: 10/28/2009 and prior chest radiographs  Findings: The cardiomediastinal silhouette is unremarkable. Mild peribronchial thickening is stable. There is no evidence of focal airspace disease, pulmonary edema, suspicious pulmonary nodule/mass, pleural effusion, or pneumothorax. No acute bony abnormalities are identified.  IMPRESSION: No evidence of active cardiopulmonary disease.   Original Report Authenticated By: Harmon Pier, M.D.    Dg Shoulder Right  06/29/2012   *RADIOLOGY REPORT*  Clinical Data: 62 year old male with right shoulder pain and swelling.  RIGHT SHOULDER - 2+ VIEW  Comparison: 10/28/2009 chest radiograph  Findings: There is no evidence of acute bony abnormality. There is no evidence of acute fracture, subluxation, or dislocation. No focal bony lesions are identified. The visualized right hemithorax is unremarkable.  Moderate degenerative changes at the [facility] joint noted. Decreased acromiohumeral space may represent a chronic rotator cuff tear.  IMPRESSION:  No evidence of acute bony abnormality.  Moderate AC joint degenerative changes and possible chronic rotator cuff tear.   Original Report Authenticated By: Harmon Pier, M.D.    Dg Forearm Right  06/29/2012  *RADIOLOGY REPORT*  Clinical Data: Right forearm pain and swelling.  RIGHT FOREARM - 2 VIEW  Comparison: None  Findings: No evidence of acute fracture, subluxation or dislocation identified.  No radio-opaque foreign bodies are present.  No focal bony lesions are noted.  The joint spaces are unremarkable.  Vascular calcifications are noted.  IMPRESSION: No evidence of acute bony abnormality.   Original Report Authenticated By: Harmon Pier, M.D.    Dg Chest Port 1 View  07/01/2012  *RADIOLOGY REPORT*  Clinical Data: Acute respiratory failure on ventilator.  Colon carcinoma.  PORTABLE CHEST - 1 VIEW  Comparison: 06/30/2012  Findings: Endotracheal tube remains high in position with the tip approximately 11 cm above the carina.  Both lungs are clear.  Heart size is normal.  Right jugular center venous catheter remains in appropriate position.  IMPRESSION:  1.  High endotracheal  tube position, with tip approximately 11 cm above carina. 2.  No active lung disease.   Original Report Authenticated By: Myles Rosenthal, M.D.    Dg Chest Port 1 View  06/30/2012  *RADIOLOGY REPORT*  Clinical Data: Endotracheal placement  PORTABLE CHEST - 1 VIEW  Comparison: Same day  Findings: Endotracheal tip is 9 cm above the carina, at  the thoracic inlet.  Right internal jugular central line is in the SVC at the azygos level.  Minimal atelectasis again noted in the left lower lobe.  No other change.  IMPRESSION: Endotracheal tube somewhat high, 9 cm above the carina at the thoracic inlet.   Original Report Authenticated By: Paulina Fusi, M.D.    Dg Chest Portable 1 View  06/30/2012  *RADIOLOGY REPORT*  Clinical Data: Central line placement.  PORTABLE CHEST - 1 VIEW  Comparison: Chest radiograph performed 06/29/2012  Findings: The lungs are well-aerated.  Mild right basilar opacity may reflect atelectasis or possibly pneumonia.  There is no evidence of pleural effusion or pneumothorax.  The cardiomediastinal silhouette is within normal limits.  No acute osseous abnormalities are seen.  A right IJ line is noted ending about the mid SVC.  IMPRESSION:  1.  Right IJ line noted ending about the mid SVC. 2.  Mild right basilar airspace opacity may reflect atelectasis or possibly pneumonia.   Original Report Authenticated By: Tonia Ghent, M.D.    Dg Foot Complete Left  06/29/2012  *RADIOLOGY REPORT*  Clinical Data: 62 year old male with left foot pain and wounds on left foot.  LEFT FOOT - COMPLETE 3+ VIEW  Comparison: None  Findings: Soft tissue gas and swelling is identified at the level of the lateral MTP joints. There is no evidence of acute fracture, subluxation or dislocation. The Lisfranc joints are intact. No focal bony lesions are present except for a large plantar calcaneal spur. Vascular calcifications are present.  IMPRESSION: Soft tissue gas and swelling overlying the distal foot suspicious either for open wound or gas forming infection.  No radiographic evidence of osteomyelitis.  No acute bony abnormalities identified.  Calcaneal spur and vascular calcifications.   Original Report Authenticated By: Harmon Pier, M.D.    Anti-infectives: Anti-infectives     Start     Dose/Rate Route Frequency Ordered Stop   06/30/12 2000   cefUROXime  (ZINACEF) 1.5 g in dextrose 5 % 50 mL IVPB  Status:  Discontinued        1.5 g 100 mL/hr over 30 Minutes Intravenous Every 12 hours 06/30/12 1954 06/30/12 2001   06/30/12 0645   piperacillin-tazobactam (ZOSYN) IVPB 3.375 g  Status:  Discontinued        3.375 g 100 mL/hr over 30 Minutes Intravenous  Once 06/30/12 0630 06/30/12 0633   06/30/12 0400   vancomycin (VANCOCIN) 500 mg in sodium chloride 0.9 % 100 mL IVPB        500 mg 100 mL/hr over 60 Minutes Intravenous Every 12 hours 06/29/12 1912     06/29/12 2200   piperacillin-tazobactam (ZOSYN) IVPB 3.375 g        3.375 g 12.5 mL/hr over 240 Minutes Intravenous 3 times per day 06/29/12 1912     06/29/12 1900   clindamycin (CLEOCIN) IVPB 600 mg        600 mg 100 mL/hr over 30 Minutes Intravenous 4 times per day 06/29/12 1847     06/29/12 1500   vancomycin (VANCOCIN) IVPB 1000 mg/200 mL premix        1,000  mg 200 mL/hr over 60 Minutes Intravenous  Once 06/29/12 1454 06/29/12 1752   06/29/12 1500   piperacillin-tazobactam (ZOSYN) IVPB 4.5 g  Status:  Discontinued        4.5 g 200 mL/hr over 30 Minutes Intravenous  Once 06/29/12 1454 06/29/12 1459   06/29/12 1500   piperacillin-tazobactam (ZOSYN) IVPB 3.375 g        3.375 g 100 mL/hr over 30 Minutes Intravenous  Once 06/29/12 1459 06/29/12 1752          Assessment/Plan: s/p Procedure(s) (LRB) with comments: AMPUTATION BELOW KNEE (Left) INTRA OPERATIVE ARTERIOGRAM (Right) AORTOGRAM (Right) - Right upper extremity arch  COMPARTMENT MEASUREMENT (Right) Continue current support. He is a weaning his pressors. No other options regarding right hand. He does continue to have his right groin sheath which can be removed today if he is off of pressors obviously cannot have brachial or radial A-line bilaterally.   LOS: 2 days   EARLY, TODD 07/01/2012, 9:51 AM

## 2012-07-01 NOTE — Consult Note (Signed)
PULMONARY  / CRITICAL CARE MEDICINE  Name: Tyler Erickson MRN: [id number] DOB: [birth year]    LOS: 2  REFERRING MD :  TRH  CHIEF COMPLAINT:  Foot gangrene, shock  BRIEF PATIENT DESCRIPTION:  62 yo male smoker admitted 06/29/2012 with chills and Lt foot pain from Lt foot gangrene and septic shock.  Significant PMHx of DM, Neuropathy, Colon cancer Stage III  LINES / TUBES: 1/03 R IJ CVL >> 1/03 ETT >> 1/03 Rt femoral Aline >>  CULTURES: 1/2 blood >> GPC in chains >> 1/2 urine >> negative  ANTIBIOTICS: 1/2 clindamycin >> 1/2 zosyn >> 1/3 vancomycin >>  EVENTS:  1/03 Transfer from [facility] to [facility] ICU with septic shock, c/o Rt arm pain 1/03 Lt BKA, RUE arteriogram 1/03 Respiratory arrest, brief CPR, re-intubated in PACU, intact mental status 1/03 Compartment measurements, right upper extremity, volar and dorsal forearm   TESTS: 1/03 RUE arteriogram >> Normal arch anatomy, diffusely atherosclerotic right upper extremity arterial tree unreconstructable  LEVEL OF CARE:  ICU PRIMARY SERVICE:  PCCM CONSULTANTS:  Vascular (Dr. Darrick Penna), Ortho (Dr. Amanda Pea) CODE STATUS: full DIET:  SQ heparin DVT Px:  SQ heparin GI Px: Protonix  INTERVAL HISTORY:  Remains on vent and low dose pressors.  VITAL SIGNS: Temp:  [97.7 F (36.5 C)-99.1 F (37.3 C)] 99.1 F (37.3 C) (01/04 0400) Pulse Rate:  [65-173] 104  (01/04 0700) Resp:  [15-47] 21  (01/04 0700) BP: (55-148)/(18-111) 88/46 mmHg (01/04 0326) SpO2:  [87 %-100 %] 100 % (01/04 0700) Arterial Line BP: (93-173)/(43-65) 121/60 mmHg (01/04 0700) FiO2 (%):  [40 %-100 %] 40 % (01/04 0700) Weight:  [162 lb 0.6 oz (73.5 kg)] 162 lb 0.6 oz (73.5 kg) (01/04 0500) HEMODYNAMICS: CVP:  [6 mmHg-8 mmHg] 7 mmHg VENTILATOR SETTINGS: Vent Mode:  [-] PRVC FiO2 (%):  [40 %-100 %] 40 % Set Rate:  [16 bmp-18 bmp] 16 bmp Vt Set:  [650 mL] 650 mL PEEP:  [5 cmH20] 5 cmH20 Plateau Pressure:  [22 cmH20-26 cmH20] 23 cmH20 INTAKE /  OUTPUT: Intake/Output      01/03 0701 - 01/04 0700 01/04 0701 - 01/05 0700   I.V. (mL/kg) 4000.2 (54.4)    IV Piggyback 1335    Total Intake(mL/kg) 5335.2 (72.6)    Urine (mL/kg/hr) 1055 (0.6)    Total Output 1055    Net +4280.2           PHYSICAL EXAMINATION:  Gen: No distress HEENT: ETT in place PULM: scattered rhonchi CV: s1s2 regular AB: soft, non tender Ext: Lt BKA site clean, Rt arm in sling and wrap Derm: no rash Neuro: Follows commands  LABS: Cbc  Lab 07/01/12 0635 06/30/12 0609 06/29/12 1420  WBC 9.7 -- --  HGB 10.2* 11.8* 13.4  HCT 28.8* 33.6* 37.1*  PLT 113* 111* 169    Chemistry   Lab 07/01/12 0635 06/30/12 2201 06/30/12 0609 06/29/12 1420  NA 140 137 139 --  K 3.4* 3.2* 3.6 --  CL 107 104 108 --  CO2 20 15* 14* --  BUN 61* 55* 48* --  CREATININE 1.80* 1.67* 1.44* --  CALCIUM 6.2* 6.4* 6.6* --  MG 2.1 -- -- 2.6*  PHOS 1.9* -- -- --  GLUCOSE 214* 251* 100* --    Liver fxn  Lab 07/01/12 0635 06/30/12 0609 06/29/12 1355  AST 154* 66* 34  ALT 46 12 10  ALKPHOS 49 46 105  BILITOT 1.8* 1.2 0.8  PROT 4.5* 4.7* 6.0  ALBUMIN 1.0* 1.2* 1.9*  coags  Lab 06/30/12 0935 06/29/12 1420  APTT 34 --  INR 1.40 1.41   Sepsis markers  Lab 07/01/12 0630 06/30/12 0609 06/29/12 1421 06/29/12 1355  LATICACIDVEN 4.5* 3.2* 6.7* --  PROCALCITON -- 45.10 -- 39.83   Cardiac markers  Lab 06/30/12 0609  CKTOTAL --  CKMB --  TROPONINI <0.30   BNP No results found for this basename: PROBNP:3 in the last 168 hours ABG  Lab 07/01/12 0425 06/30/12 1700 06/30/12 1610  PHART 7.396 7.304* 6.985*  PCO2ART 40.7 29.1* 47.9*  PO2ART 153.0* 336.0* 226.0*  HCO3 24.5* 14.0* 10.9*  TCO2 25.7 14.9 12.3    CBG trend  Lab 07/01/12 0333 06/30/12 2340 06/30/12 2022 06/30/12 1249 06/30/12 0813  GLUCAP 208* 201* 207* 115* 89    IMAGING:  Dg Chest 2 View  06/29/2012  *RADIOLOGY REPORT*  Clinical Data: 62 year old male with weakness and swelling.  CHEST - 2 VIEW   Comparison: 10/28/2009 and prior chest radiographs  Findings: The cardiomediastinal silhouette is unremarkable. Mild peribronchial thickening is stable. There is no evidence of focal airspace disease, pulmonary edema, suspicious pulmonary nodule/mass, pleural effusion, or pneumothorax. No acute bony abnormalities are identified.  IMPRESSION: No evidence of active cardiopulmonary disease.   Original Report Authenticated By: Harmon Pier, M.D.    Dg Shoulder Right  06/29/2012  *RADIOLOGY REPORT*  Clinical Data: 62 year old male with right shoulder pain and swelling.  RIGHT SHOULDER - 2+ VIEW  Comparison: 10/28/2009 chest radiograph  Findings: There is no evidence of acute bony abnormality. There is no evidence of acute fracture, subluxation, or dislocation. No focal bony lesions are identified. The visualized right hemithorax is unremarkable.  Moderate degenerative changes at the [facility] joint noted. Decreased acromiohumeral space may represent a chronic rotator cuff tear.  IMPRESSION:  No evidence of acute bony abnormality.  Moderate AC joint degenerative changes and possible chronic rotator cuff tear.   Original Report Authenticated By: Harmon Pier, M.D.    Dg Forearm Right  06/29/2012  *RADIOLOGY REPORT*  Clinical Data: Right forearm pain and swelling.  RIGHT FOREARM - 2 VIEW  Comparison: None  Findings: No evidence of acute fracture, subluxation or dislocation identified.  No radio-opaque foreign bodies are present.  No focal bony lesions are noted.  The joint spaces are unremarkable.  Vascular calcifications are noted.  IMPRESSION: No evidence of acute bony abnormality.   Original Report Authenticated By: Harmon Pier, M.D.    Dg Chest Port 1 View  06/30/2012  *RADIOLOGY REPORT*  Clinical Data: Endotracheal placement  PORTABLE CHEST - 1 VIEW  Comparison: Same day  Findings: Endotracheal tip is 9 cm above the carina, at the thoracic inlet.  Right internal jugular central line is in the SVC at the azygos level.   Minimal atelectasis again noted in the left lower lobe.  No other change.  IMPRESSION: Endotracheal tube somewhat high, 9 cm above the carina at the thoracic inlet.   Original Report Authenticated By: Paulina Fusi, M.D.    Dg Chest Portable 1 View  06/30/2012  *RADIOLOGY REPORT*  Clinical Data: Central line placement.  PORTABLE CHEST - 1 VIEW  Comparison: Chest radiograph performed 06/29/2012  Findings: The lungs are well-aerated.  Mild right basilar opacity may reflect atelectasis or possibly pneumonia.  There is no evidence of pleural effusion or pneumothorax.  The cardiomediastinal silhouette is within normal limits.  No acute osseous abnormalities are seen.  A right IJ line is noted ending about the mid SVC.  IMPRESSION:  1.  Right  IJ line noted ending about the mid SVC. 2.  Mild right basilar airspace opacity may reflect atelectasis or possibly pneumonia.   Original Report Authenticated By: Tonia Ghent, M.D.    Dg Foot Complete Left  06/29/2012  *RADIOLOGY REPORT*  Clinical Data: 62 year old male with left foot pain and wounds on left foot.  LEFT FOOT - COMPLETE 3+ VIEW  Comparison: None  Findings: Soft tissue gas and swelling is identified at the level of the lateral MTP joints. There is no evidence of acute fracture, subluxation or dislocation. The Lisfranc joints are intact. No focal bony lesions are present except for a large plantar calcaneal spur. Vascular calcifications are present.  IMPRESSION: Soft tissue gas and swelling overlying the distal foot suspicious either for open wound or gas forming infection.  No radiographic evidence of osteomyelitis.  No acute bony abnormalities identified.  Calcaneal spur and vascular calcifications.   Original Report Authenticated By: Harmon Pier, M.D.     ASSESSMENT / PLAN:  PULMONARY  ASSESSMENT: Acute respiratory failure post-op in setting of septic shock >> likely from residual effects of anesthesia. PLAN:   Continue full vent support until more  stable F/u CXR, ABG PRN BD's  CARDIOVASCULAR  ASSESSMENT:  Septic shock 2nd to Lt foot cellulitis.  Cortisol 60.2 from 1/03. Rt arm arterial disease PLAN:  Pressors to keep SBP > 90, MAP > 65 Fluids to keep CVP > 8  RENAL  ASSESSMENT:   Acute renal failure 2nd to shock. Metabolic acidosis. Hypokalemia. PLAN:   Monitor renal fx, urine outpt, electrolytes D/c HCO3 from IV fluid  GASTROINTESTINAL  ASSESSMENT:   Nutrition. PLAN:   Place OG tube and start tube feeds while on vent  HEMATOLOGIC  ASSESSMENT:   Anemia, thrombocytopenia of critical illness. PLAN:  F/u CBC Transfuse for Hb < 7  INFECTIOUS  ASSESSMENT:   Lt foot gangrene with septic shock.  GPC in blood cultures. PLAN:   Check TTE  Continue Abx Post-op care per [facility]  ENDOCRINE  ASSESSMENT:   DM, with hyperglycemia. PLAN:   SSI  NEUROLOGIC  ASSESSMENT:   Acute encephalopathy due to sepsis PLAN:   Intermittent sedation while on vent  Updated family at bedside.  CC time 35 minutes.  Coralyn Helling, MD [facility] Pulmonary/Critical Care 07/01/2012, 8:47 AM Pager:  [phone] After 3pm call: [phone]

## 2012-07-01 NOTE — Progress Notes (Signed)
Subjective: 1 Day Post-Op Procedure(s) (LRB): AMPUTATION BELOW KNEE (Left) INTRA OPERATIVE ARTERIOGRAM (Right) AORTOGRAM (Right) COMPARTMENT MEASUREMENT (Right) Patient does not report pain now.  Semi alert intubated.  Just got fentanyl.  Objective: Vital signs in last 24 hours: Temp:  [97.7 F (36.5 C)-99.1 F (37.3 C)] 99.1 F (37.3 C) (01/04 0400) Pulse Rate:  [65-173] 104  (01/04 0700) Resp:  [15-47] 21  (01/04 0700) BP: (55-148)/(18-111) 88/46 mmHg (01/04 0326) SpO2:  [87 %-100 %] 100 % (01/04 0700) Arterial Line BP: (93-173)/(43-65) 121/60 mmHg (01/04 0700) FiO2 (%):  [40 %-100 %] 40 % (01/04 0700) Weight:  [73.5 kg (162 lb 0.6 oz)] 73.5 kg (162 lb 0.6 oz) (01/04 0500)  Intake/Output from previous day: 01/03 0701 - 01/04 0700 In: 5335.2 [I.V.:4000.2; IV Piggyback:1335] Out: 1055 [Urine:1055] Intake/Output this shift:     Basename 07/01/12 0635 06/30/12 0609 06/29/12 1420  HGB 10.2* 11.8* 13.4    Basename 07/01/12 0635 06/30/12 0609  WBC 9.7 8.9  RBC 3.63* 4.09*  HCT 28.8* 33.6*  PLT 113* 111*    Basename 07/01/12 0635 06/30/12 2201  NA 140 137  K 3.4* 3.2*  CL 107 104  CO2 20 15*  BUN 61* 55*  CREATININE 1.80* 1.67*  GLUCOSE 214* 251*  CALCIUM 6.2* 6.4*    Basename 06/30/12 0935 06/29/12 1420  LABPT -- --  INR 1.40 1.41    Today patient would slightly flex fingers.  Hand still cold.  Compartments soft.  Assessment/Plan: 1 Day Post-Op Procedure(s) (LRB): AMPUTATION BELOW KNEE (Left) INTRA OPERATIVE ARTERIOGRAM (Right) AORTOGRAM (Right) COMPARTMENT MEASUREMENT (Right) Discussed patient with Dr Arbie Cookey.  We both agree with Drs Dion Saucier and Amanda Pea that his ischemia in his right upper extremity is coming from vascular disease.  Family report right upper extremity pain has been present for a while and was more painful than his necrotic foot.  Will sign off for now but will be happy to see this patient again if anything arises.  Zina Pitzer A. Tomasa Rand,  PA-C for Dr Teryl Lucy Physician Assistant [facility] [phone]  07/01/2012, 9:15 AM  Pascal Lux 07/01/2012, 9:11 AM

## 2012-07-02 ENCOUNTER — Inpatient Hospital Stay (HOSPITAL_COMMUNITY): Payer: Medicare HMO

## 2012-07-02 LAB — CBC
HCT: 25.5 % — ABNORMAL LOW (ref 39.0–52.0)
Hemoglobin: 8.9 g/dL — ABNORMAL LOW (ref 13.0–17.0)
MCH: 28.2 pg (ref 26.0–34.0)
MCV: 80.7 fL (ref 78.0–100.0)
Platelets: DECREASED 10*3/uL (ref 150–400)
RBC: 3.16 MIL/uL — ABNORMAL LOW (ref 4.22–5.81)
WBC: 12.6 10*3/uL — ABNORMAL HIGH (ref 4.0–10.5)

## 2012-07-02 LAB — BLOOD GAS, ARTERIAL
Bicarbonate: 20.8 mEq/L (ref 20.0–24.0)
O2 Saturation: 99.5 %
PEEP: 5 cmH2O
TCO2: 21.7 mmol/L (ref 0–100)
pH, Arterial: 7.487 — ABNORMAL HIGH (ref 7.350–7.450)
pO2, Arterial: 181 mmHg — ABNORMAL HIGH (ref 80.0–100.0)

## 2012-07-02 LAB — COMPREHENSIVE METABOLIC PANEL
AST: 91 U/L — ABNORMAL HIGH (ref 0–37)
BUN: 69 mg/dL — ABNORMAL HIGH (ref 6–23)
CO2: 20 mEq/L (ref 19–32)
Calcium: 6.7 mg/dL — ABNORMAL LOW (ref 8.4–10.5)
Chloride: 106 mEq/L (ref 96–112)
Creatinine, Ser: 2.21 mg/dL — ABNORMAL HIGH (ref 0.50–1.35)
GFR calc Af Amer: 35 mL/min — ABNORMAL LOW (ref >90)
GFR calc non Af Amer: 31 mL/min — ABNORMAL LOW (ref >90)
Glucose, Bld: 129 mg/dL — ABNORMAL HIGH (ref 70–99)
Total Bilirubin: 2.6 mg/dL — ABNORMAL HIGH (ref 0.3–1.2)

## 2012-07-02 LAB — GLUCOSE, CAPILLARY: Glucose-Capillary: 175 mg/dL — ABNORMAL HIGH (ref 70–99)

## 2012-07-02 LAB — SODIUM, URINE, RANDOM: Sodium, Ur: 10 mEq/L

## 2012-07-02 LAB — MAGNESIUM: Magnesium: 2.4 mg/dL (ref 1.5–2.5)

## 2012-07-02 LAB — CREATININE, URINE, RANDOM: Creatinine, Urine: 123.01 mg/dL

## 2012-07-02 MED ORDER — POTASSIUM CHLORIDE 20 MEQ/15ML (10%) PO LIQD
40.0000 meq | Freq: Once | ORAL | Status: AC
  Administered 2012-07-02: 40 meq
  Filled 2012-07-02: qty 30

## 2012-07-02 MED ORDER — OXYCODONE-ACETAMINOPHEN 5-325 MG PO TABS
1.0000 | ORAL_TABLET | Freq: Four times a day (QID) | ORAL | Status: DC | PRN
  Administered 2012-07-02 – 2012-07-03 (×4): 1 via ORAL
  Filled 2012-07-02 (×4): qty 1

## 2012-07-02 MED ORDER — ALBUTEROL SULFATE (5 MG/ML) 0.5% IN NEBU: 2.5000 mg | INHALATION_SOLUTION | RESPIRATORY_TRACT | Status: DC | PRN

## 2012-07-02 MED ORDER — ACETAMINOPHEN 325 MG PO TABS
650.0000 mg | ORAL_TABLET | Freq: Four times a day (QID) | ORAL | Status: DC | PRN
  Administered 2012-07-02: 325 mg via ORAL
  Administered 2012-07-03: 650 mg via ORAL
  Filled 2012-07-02: qty 2
  Filled 2012-07-02: qty 1

## 2012-07-02 MED ORDER — PANTOPRAZOLE SODIUM 40 MG PO TBEC
40.0000 mg | DELAYED_RELEASE_TABLET | Freq: Every day | ORAL | Status: DC
  Administered 2012-07-02: 40 mg via ORAL
  Filled 2012-07-02: qty 1

## 2012-07-02 MED ORDER — INSULIN ASPART 100 UNIT/ML ~~LOC~~ SOLN
0.0000 [IU] | Freq: Three times a day (TID) | SUBCUTANEOUS | Status: DC
  Administered 2012-07-02: 2 [IU] via SUBCUTANEOUS
  Administered 2012-07-02: 3 [IU] via SUBCUTANEOUS
  Administered 2012-07-03: 2 [IU] via SUBCUTANEOUS
  Administered 2012-07-03 – 2012-07-04 (×5): 3 [IU] via SUBCUTANEOUS
  Administered 2012-07-05: 2 [IU] via SUBCUTANEOUS
  Administered 2012-07-05 (×2): 3 [IU] via SUBCUTANEOUS
  Administered 2012-07-06 (×2): 5 [IU] via SUBCUTANEOUS
  Administered 2012-07-06 – 2012-07-07 (×3): 3 [IU] via SUBCUTANEOUS
  Administered 2012-07-07: 2 [IU] via SUBCUTANEOUS
  Administered 2012-07-08: 5 [IU] via SUBCUTANEOUS
  Administered 2012-07-08 – 2012-07-09 (×2): 3 [IU] via SUBCUTANEOUS
  Administered 2012-07-09 (×2): 2 [IU] via SUBCUTANEOUS
  Administered 2012-07-10: 3 [IU] via SUBCUTANEOUS
  Administered 2012-07-10: 5 [IU] via SUBCUTANEOUS

## 2012-07-02 MED ORDER — POTASSIUM CHLORIDE 20 MEQ/15ML (10%) PO LIQD
ORAL | Status: AC
  Filled 2012-07-02: qty 30

## 2012-07-02 MED ORDER — HYDROMORPHONE HCL PF 1 MG/ML IJ SOLN
1.0000 mg | INTRAMUSCULAR | Status: DC | PRN
  Administered 2012-07-02 – 2012-07-03 (×3): 1 mg via INTRAVENOUS
  Filled 2012-07-02 (×3): qty 1

## 2012-07-02 MED ORDER — IPRATROPIUM BROMIDE 0.02 % IN SOLN: 0.5000 mg | RESPIRATORY_TRACT | Status: DC | PRN

## 2012-07-02 NOTE — Progress Notes (Signed)
ANTIBIOTIC CONSULT NOTE - FOLLOW UP  Pharmacy Consult for zosyn Indication: sepsis  No Known Allergies  Patient Measurements: Height: 5\' 8"  (172.7 cm) Weight: 165 lb 9.1 oz (75.1 kg) IBW/kg (Calculated) : 68.4    Vital Signs: Temp: 99.5 F (37.5 C) (01/05 0800) Temp src: Oral (01/05 0800) BP: 103/50 mmHg (01/05 0350) Pulse Rate: 106  (01/05 1100) Intake/Output from previous day: 01/04 0701 - 01/05 0700 In: 3087.1 [I.V.:2142.1; NG/GT:560; IV Piggyback:385] Out: 685 [Urine:685] Intake/Output from this shift: Total I/O In: 698 [I.V.:363; NG/GT:285; IV Piggyback:50] Out: 375 [Urine:375]  Labs:  [facility] 07/02/12 0820 07/02/12 0458 07/01/12 0635 06/30/12 2201 06/30/12 0609  WBC -- 12.6* 9.7 -- 8.9  HGB -- 8.9* 10.2* -- 11.8*  PLT -- PLATELET CLUMPS NOTED ON SMEAR, COUNT APPEARS DECREASED 113* -- 111*  LABCREA 123.01 -- -- -- --  CREATININE -- 2.21* 1.80* 1.67* --   Estimated Creatinine Clearance: 34.4 ml/min (by C-G formula based on Cr of 2.21). No results found for this basename: VANCOTROUGH:2,VANCOPEAK:2,VANCORANDOM:2,GENTTROUGH:2,GENTPEAK:2,GENTRANDOM:2,TOBRATROUGH:2,TOBRAPEAK:2,TOBRARND:2,AMIKACINPEAK:2,AMIKACINTROU:2,AMIKACIN:2, in the last 72 hours   Microbiology: Recent Results (from the past 720 hour(s))  CULTURE, BLOOD (ROUTINE X 2)     Status: Normal   Collection Time   06/29/12  2:15 PM      Component Value Range Status Comment   Specimen Description BLOOD RIGHT HAND   Final    Special Requests BOTTLES DRAWN AEROBIC AND ANAEROBIC 4CC EACH   Final    Culture  Setup Time 06/29/2012 21:41   Final    Culture     Final    Value: GROUP A STREP (S.PYOGENES) ISOLATED     Note: CRITICAL RESULT CALLED TO, READ BACK BY AND VERIFIED WITH: CHRIS ARMITAGE 07/01/12 @ 10:45AM BY RUSCA.     Note: Gram Stain Report Called to,Read Back By and Verified With: TERESA Antony Salmon 06/30/12 1040 BY SMITHERSJ   Report Status 07/01/2012 FINAL   Final   CULTURE, BLOOD (ROUTINE X 2)     Status:  Normal   Collection Time   06/29/12  2:20 PM      Component Value Range Status Comment   Specimen Description BLOOD RIGHT THUMB   Final    Special Requests BOTTLES DRAWN AEROBIC ONLY 3CC   Final    Culture  Setup Time 06/29/2012 21:40   Final    Culture     Final    Value: GROUP A STREP (S.PYOGENES) ISOLATED     Note: CRITICAL RESULT CALLED TO, READ BACK BY AND VERIFIED WITH: CHRIS ARMITAGE 07/01/12 @ 10:45AM BY RUSCA.     Note: PAM ALVORD 06/30/12 1135 BY SMITHERSJ   Report Status 07/01/2012 FINAL   Final   URINE CULTURE     Status: Normal   Collection Time   06/29/12  4:01 PM      Component Value Range Status Comment   Specimen Description URINE, CLEAN CATCH   Final    Special Requests NONE   Final    Culture  Setup Time 06/30/2012 02:24   Final    Colony Count NO GROWTH   Final    Culture NO GROWTH   Final    Report Status 07/01/2012 FINAL   Final   MRSA PCR SCREENING     Status: Normal   Collection Time   06/29/12  8:56 PM      Component Value Range Status Comment   MRSA by PCR NEGATIVE  NEGATIVE Final   SURGICAL PCR SCREEN     Status: Normal  Collection Time   06/30/12 10:08 AM      Component Value Range Status Comment   MRSA, PCR NEGATIVE  NEGATIVE Final    Staphylococcus aureus NEGATIVE  NEGATIVE Final     Anti-infectives     Start     Dose/Rate Route Frequency Ordered Stop   06/30/12 2000   cefUROXime (ZINACEF) 1.5 g in dextrose 5 % 50 mL IVPB  Status:  Discontinued        1.5 g 100 mL/hr over 30 Minutes Intravenous Every 12 hours 06/30/12 1954 06/30/12 2001   06/30/12 0645   piperacillin-tazobactam (ZOSYN) IVPB 3.375 g  Status:  Discontinued        3.375 g 100 mL/hr over 30 Minutes Intravenous  Once 06/30/12 0630 06/30/12 0633   06/30/12 0400   vancomycin (VANCOCIN) 500 mg in sodium chloride 0.9 % 100 mL IVPB  Status:  Discontinued        500 mg 100 mL/hr over 60 Minutes Intravenous Every 12 hours 06/29/12 1912 07/01/12 1207   06/29/12 2200  piperacillin-tazobactam  (ZOSYN) IVPB 3.375 g       3.375 g 12.5 mL/hr over 240 Minutes Intravenous 3 times per day 06/29/12 1912     06/29/12 1900   clindamycin (CLEOCIN) IVPB 600 mg        600 mg 100 mL/hr over 30 Minutes Intravenous 4 times per day 06/29/12 1847     06/29/12 1500   vancomycin (VANCOCIN) IVPB 1000 mg/200 mL premix        1,000 mg 200 mL/hr over 60 Minutes Intravenous  Once 06/29/12 1454 06/29/12 1752   06/29/12 1500   piperacillin-tazobactam (ZOSYN) IVPB 4.5 g  Status:  Discontinued        4.5 g 200 mL/hr over 30 Minutes Intravenous  Once 06/29/12 1454 06/29/12 1459   06/29/12 1500  piperacillin-tazobactam (ZOSYN) IVPB 3.375 g       3.375 g 100 mL/hr over 30 Minutes Intravenous  Once 06/29/12 1459 06/29/12 1752          Assessment: 61 yoM  with hx of DM, non-compliant with meds x 2 years admitted with gangrenous LLE, continues on zosyn per pharmacy and clindamycin. Scr trend up 1.80> 2.21 est crcl 94ml/min. Blood cultures + for Group A strep. WBC 12.6 afebrile.  Plan: 1. Continue Zosyn 3.375 gm q8h-4 hr infusion 2. Continue Clindamycin 600mg  q6hr -no renal adjustment needed 3. Monitor renal function and clinical progress  Bola A. Wandra Feinstein D Clinical Pharmacist [phone] Phone [phone] 07/02/2012 12:54 PM

## 2012-07-02 NOTE — Progress Notes (Signed)
PULMONARY  / CRITICAL CARE MEDICINE  Name: Tyler Erickson MRN: [id number] DOB: 1951-03-10    LOS: 3  REFERRING MD :  TRH  CHIEF COMPLAINT:  Foot gangrene, shock  BRIEF PATIENT DESCRIPTION:  62 yo male smoker admitted 06/29/2012 with chills and Lt foot pain from Lt foot gangrene and septic shock.  Significant PMHx of DM, Neuropathy, Colon cancer Stage III  LINES / TUBES: 1/03 R IJ CVL >> 1/03 ETT >> 1/03 Rt femoral Aline >>  CULTURES: 1/2 blood >> GPC in chains >> Group A strep sens pending.  1/2 urine >> negative  ANTIBIOTICS: 1/2 clindamycin >> 1/2 zosyn >> 1/3 vancomycin >> 1/3  EVENTS:  1/03 Transfer from [facility] to [facility] ICU with septic shock, c/o Rt arm pain 1/03 Lt BKA, RUE arteriogram 1/03 Respiratory arrest, brief CPR, re-intubated in PACU, intact mental status 1/03 Compartment measurements, right upper extremity, volar and dorsal forearm  TESTS: 1/03 RUE arteriogram >> Normal arch anatomy, diffusely atherosclerotic right upper extremity arterial tree unreconstructable  LEVEL OF CARE:  ICU PRIMARY SERVICE:  PCCM CONSULTANTS:  Vascular (Dr. Darrick Penna), Ortho (Dr. Amanda Pea) CODE STATUS: full DIET:  SQ heparin DVT Px:  SQ heparin GI Px: Protonix  INTERVAL HISTORY:  Remains on vent but able to wean off pressors yesterday.  Febrile yesterday to 101.6.  Creatinine bumping slightly.   VITAL SIGNS: Temp:  [99.6 F (37.6 C)-101.6 F (38.7 C)] 99.6 F (37.6 C) (01/05 0400) Pulse Rate:  [101-114] 103  (01/05 0500) Resp:  [16-22] 21  (01/05 0500) BP: (103-116)/(50-66) 103/50 mmHg (01/05 0350) SpO2:  [99 %-100 %] 100 % (01/05 0601) Arterial Line BP: (102-127)/(49-67) 105/49 mmHg (01/05 0500) FiO2 (%):  [30 %-40 %] 30 % (01/05 0601) Weight:  [165 lb 9.1 oz (75.1 kg)] 165 lb 9.1 oz (75.1 kg) (01/05 0452) HEMODYNAMICS: CVP:  [5 mmHg-9 mmHg] 7 mmHg VENTILATOR SETTINGS: Vent Mode:  [-] PRVC FiO2 (%):  [30 %-40 %] 30 % Set Rate:  [16 bmp] 16 bmp Vt Set:  [650 mL] 650  mL PEEP:  [5 cmH20] 5 cmH20 Plateau Pressure:  [20 cmH20-26 cmH20] 20 cmH20 INTAKE / OUTPUT: Intake/Output      01/04 0701 - 01/05 0700 01/05 0701 - 01/06 0700   I.V. (mL/kg) 1942.1 (25.9)    NG/GT 460    IV Piggyback 347.5    Total Intake(mL/kg) 2749.6 (36.6)    Urine (mL/kg/hr) 685 (0.4)    Total Output 685    Net +2064.6           PHYSICAL EXAMINATION:  Gen: No distress, tolerating vent well.  Arousable and interactive.  HEENT: ETT in place, no scleral icterus.   PULM: scattered rhonchi CV: s1s2 regular AB: soft, non tender Ext: Lt BKA site clean, Rt arm in sling and wrap.  Able to move fingers bilaterally and right toes.  2 cm dry lesion on the medial tip of the right great toe with no exudate.  Derm: no rash Neuro: Follows commands  LABS: Cbc  Lab 07/02/12 0458 07/01/12 0635 06/30/12 0609  WBC 12.6* -- --  HGB 8.9* 10.2* 11.8*  HCT 25.5* 28.8* 33.6*  PLT PLATELET CLUMPS NOTED ON SMEAR, COUNT APPEARS DECREASED 113* 111*   Chemistry   Lab 07/02/12 0458 07/01/12 0635 06/30/12 2201 06/29/12 1420  NA 139 140 137 --  K 3.3* 3.4* 3.2* --  CL 106 107 104 --  CO2 20 20 15* --  BUN 69* 61* 55* --  CREATININE 2.21* 1.80*  1.67* --  CALCIUM 6.7* 6.2* 6.4* --  MG 2.4 2.1 -- 2.6*  PHOS 2.8 1.9* -- --  GLUCOSE 129* 214* 251* --    Liver fxn  Lab 07/02/12 0458 07/01/12 0635 06/30/12 0609  AST 91* 154* 66*  ALT 30 46 12  ALKPHOS 73 49 46  BILITOT 2.6* 1.8* 1.2  PROT 5.0* 4.5* 4.7*  ALBUMIN 0.9* 1.0* 1.2*   coags  Lab 06/30/12 0935 06/29/12 1420  APTT 34 --  INR 1.40 1.41   Sepsis markers  Lab 07/01/12 0635 07/01/12 0630 06/30/12 0609 06/29/12 1421 06/29/12 1355  LATICACIDVEN -- 4.5* 3.2* 6.7* --  PROCALCITON 41.85 -- 45.10 -- 39.83   Cardiac markers  Lab 06/30/12 0609  CKTOTAL --  CKMB --  TROPONINI <0.30   BNP No results found for this basename: PROBNP:3 in the last 168 hours ABG  Lab 07/02/12 0414 07/01/12 0425 06/30/12 1700  PHART 7.487*  7.396 7.304*  PCO2ART 27.8* 40.7 29.1*  PO2ART 181.0* 153.0* 336.0*  HCO3 20.8 24.5* 14.0*  TCO2 21.7 25.7 14.9   CBG trend  Lab 07/02/12 0414 07/01/12 2320 07/01/12 2032 07/01/12 2028 07/01/12 1613  GLUCAP 112* 87 78 86 119*   IMAGING:  Ct Head Wo Contrast  07/01/2012  *RADIOLOGY REPORT*  Clinical Data: Right arm weakness.  Rule out stroke.  CT HEAD WITHOUT CONTRAST  Technique:  Contiguous axial images were obtained from the base of the skull through the vertex without contrast.  Comparison: None.  Findings: Mild atrophy.  Negative for acute infarct.  Negative for hemorrhage or mass lesion.  No fluid collection is present. Calvarium is intact.  IMPRESSION: Mild atrophy.  No acute abnormality.   Original Report Authenticated By: Janeece Riggers, M.D.    Dg Chest Port 1 View  07/02/2012  *RADIOLOGY REPORT*  Clinical Data: Advancement of endotracheal tube  PORTABLE CHEST - 1 VIEW  Comparison: Portable exam 0626 hours compared to 07/02/2012 at 0514 hours  Findings: Tip of endotracheal tube now 2.0 cm above carina. Nasogastric tube and right jugular line unchanged. Stable heart size. Persistent atelectasis versus consolidation left lower lobe. Mild right basilar atelectasis. Upper lungs clear.  IMPRESSION: Tip of endotracheal tube now 2.0 cm above carina. Remainder exam unchanged.   Original Report Authenticated By: Ulyses Southward, M.D.    Dg Chest Port 1 View  07/02/2012  *RADIOLOGY REPORT*  Clinical Data: Follow up atelectasis  PORTABLE CHEST - 1 VIEW  Comparison: Portable exam 0514 hours compared to 07/01/2012  Findings: Tip of endotracheal tube in cervical region, 11.8 cm above carina. Right jugular central venous catheter tip projecting over SVC. Nasogastric tube extends into stomach. Numerous cardiac monitoring leads project over chest. Upper-normal size of cardiac silhouette. Mediastinal contours and pulmonary vascularity normal. Minimal right basilar atelectasis. Slightly increased atelectasis versus  consolidation in medial left lower lobe. No definite pleural effusion or pneumothorax.  IMPRESSION: Minimal right basilar atelectasis with increased atelectasis versus consolidation in left lower lobe. High position of endotracheal tube 11.8 cm above carina; may consider advancing tube 6 cm for more stable position within thoracic trachea.   Original Report Authenticated By: Ulyses Southward, M.D.    Dg Chest Port 1 View  07/01/2012  *RADIOLOGY REPORT*  Clinical Data: Acute respiratory failure on ventilator.  Colon carcinoma.  PORTABLE CHEST - 1 VIEW  Comparison: 06/30/2012  Findings: Endotracheal tube remains high in position with the tip approximately 11 cm above the carina.  Both lungs are clear.  Heart size is normal.  Right jugular center venous catheter remains in appropriate position.  IMPRESSION:  1.  High endotracheal tube position, with tip approximately 11 cm above carina. 2.  No active lung disease.   Original Report Authenticated By: Myles Rosenthal, M.D.    Dg Chest Port 1 View  06/30/2012  *RADIOLOGY REPORT*  Clinical Data: Endotracheal placement  PORTABLE CHEST - 1 VIEW  Comparison: Same day  Findings: Endotracheal tip is 9 cm above the carina, at the thoracic inlet.  Right internal jugular central line is in the SVC at the azygos level.  Minimal atelectasis again noted in the left lower lobe.  No other change.  IMPRESSION: Endotracheal tube somewhat high, 9 cm above the carina at the thoracic inlet.   Original Report Authenticated By: Paulina Fusi, M.D.    ASSESSMENT / PLAN:  PULMONARY  ASSESSMENT: Acute respiratory failure post-op in setting of septic shock >> likely from residual effects of anesthesia.   PLAN:   Continue full vent support, attempt to wean today now that off pressors since yesterday morning.  Advance ETT 6 cm (Done).  PRN BD's  CARDIOVASCULAR  ASSESSMENT:  Septic shock 2nd to Lt foot cellulitis.  Cortisol 60.2 from 1/03. Rt arm arterial disease PLAN:  Continue to follow  CVPs for volume.  Will likely require some bolus because of rising creatinine.   RENAL  ASSESSMENT:   Acute on chronic renal failure: secondary to shock.  Creatinine 2.21 today up from 1.8,  Baseline 1.1-1.3.  Other differential include prerenal azotemia (increasing BUN as well) vs. Vancomycin induced AIN vs Ischemic ATN.  UOP 335 over the last 12 hours.   Metabolic acidosis: Resolved.  Hypokalemia: Replaced.  PLAN:   Monitor renal fx, urine outpt, electrolytes Check urine lytes.   GASTROINTESTINAL  ASSESSMENT:   Nutrition. PLAN:   Place OG tube and start tube feeds while on vent  HEMATOLOGIC  ASSESSMENT:   Anemia, thrombocytopenia of critical illness. PLAN:  F/u CBC Transfuse for Hb < 7  INFECTIOUS  ASSESSMENT:   Lt foot gangrene with septic shock.  GPC in blood cultures. PLAN:   Check TTE  Continue Abx Post-op care per VVS  ENDOCRINE  ASSESSMENT:   DM, with hyperglycemia. PLAN:   SSI  NEUROLOGIC  ASSESSMENT:   Acute encephalopathy due to sepsis: now resolving.  PLAN:   Intermittent sedation while on vent  Clinical Summary: Mr. Gaymon is improving slowly.  Likely will be able to tolerate wean today and hopefully extubate.  Will await sensitivities and then consider narrowing ABx.  Will need to follow Urine lytes today to see if we need to bolus or just follow his Cr.  UOP is adequate currently.  Rest for the post op as well as plans for the right arm per VVS.   PRIBULA,CHRISTOPHER, MD Internal Medicine Resident, PGY III Chief Resident, Internal Medicine Pager: [phone] 07/02/2012 7:28 AM   Reviewed above, examined pt, and agree with assessment/plan.  His sepsis is improving.  Respiratory mechanics better >> will proceed with extubation.  Advance diet as tolerated after extubation.  Continue current Abx.  Monitor renal fx >> continue IV fluids.  CC time 35 minutes. Updated family at bedside.  Coralyn Helling, MD [facility] Pulmonary/Critical Care 07/02/2012,  10:24 AM Pager:  [phone] After 3pm call: [phone]

## 2012-07-02 NOTE — Progress Notes (Signed)
  Echocardiogram 2D Echocardiogram has been performed.  Georgian Co 07/02/2012, 9:40 AM

## 2012-07-02 NOTE — Progress Notes (Signed)
eLink Physician-Brief Progress Note Patient Name: Tyler Erickson DOB: 06/28/1951 MRN: [id number]  Date of Service  07/02/2012   HPI/Events of Note   hypokalemia  eICU Interventions  Potassium replaced   Intervention Category Minor Interventions: Electrolytes abnormality - evaluation and management  Blanche Gallien 07/02/2012, 5:49 AM

## 2012-07-02 NOTE — Procedures (Signed)
Extubation Procedure Note  Patient Details:   Name: Tyler Erickson DOB: 1951/04/12 MRN: [id number]   Airway Documentation:     Evaluation  O2 sats: stable throughout Complications: No apparent complications Patient did tolerate procedure well. Bilateral Breath Sounds: Rhonchi   Yes. Pt extubated per dr. Jarrett Ables.  RR slightly high.  Encouraging pt to slow breathing down.  Placed on 3L [place]  Tyler Erickson V 07/02/2012, 10:34 AM

## 2012-07-02 NOTE — Progress Notes (Signed)
Extubated per Dr. orders

## 2012-07-02 NOTE — Progress Notes (Signed)
Subjective: Interval History: none.. Alert on vent.  No c/o pain  Objective: Vital signs in last 24 hours: Temp:  [99.6 F (37.6 C)-101.6 F (38.7 C)] 99.6 F (37.6 C) (01/05 0400) Pulse Rate:  [98-114] 98  (01/05 0734) Resp:  [16-22] 19  (01/05 0734) BP: (103-116)/(50-66) 103/50 mmHg (01/05 0350) SpO2:  [99 %-100 %] 100 % (01/05 0734) Arterial Line BP: (102-127)/(49-67) 105/49 mmHg (01/05 0500) FiO2 (%):  [30 %-40 %] 30 % (01/05 0734) Weight:  [165 lb 9.1 oz (75.1 kg)] 165 lb 9.1 oz (75.1 kg) (01/05 0452)  Intake/Output from previous day: 01/04 0701 - 01/05 0700 In: 3087.1 [I.V.:2142.1; NG/GT:560; IV Piggyback:385] Out: 685 [Urine:685] Intake/Output this shift:    L BKA dressing intact.  No change in R hand.  Warm with minimal motor function.  Good doppler flow in ulnar,radial and palmar arch  Lab Results:  Basename 07/02/12 0458 07/01/12 0635  WBC 12.6* 9.7  HGB 8.9* 10.2*  HCT 25.5* 28.8*  PLT PLATELET CLUMPS NOTED ON SMEAR, COUNT APPEARS DECREASED 113*   BMET  Basename 07/02/12 0458 07/01/12 0635  NA 139 140  K 3.3* 3.4*  CL 106 107  CO2 20 20  GLUCOSE 129* 214*  BUN 69* 61*  CREATININE 2.21* 1.80*  CALCIUM 6.7* 6.2*    Studies/Results: Dg Chest 2 View  06/29/2012  *RADIOLOGY REPORT*  Clinical Data: 62 year old male with weakness and swelling.  CHEST - 2 VIEW  Comparison: 10/28/2009 and prior chest radiographs  Findings: The cardiomediastinal silhouette is unremarkable. Mild peribronchial thickening is stable. There is no evidence of focal airspace disease, pulmonary edema, suspicious pulmonary nodule/mass, pleural effusion, or pneumothorax. No acute bony abnormalities are identified.  IMPRESSION: No evidence of active cardiopulmonary disease.   Original Report Authenticated By: Harmon Pier, M.D.    Dg Shoulder Right  06/29/2012  *RADIOLOGY REPORT*  Clinical Data: 62 year old male with right shoulder pain and swelling.  RIGHT SHOULDER - 2+ VIEW  Comparison:  10/28/2009 chest radiograph  Findings: There is no evidence of acute bony abnormality. There is no evidence of acute fracture, subluxation, or dislocation. No focal bony lesions are identified. The visualized right hemithorax is unremarkable.  Moderate degenerative changes at the [facility] joint noted. Decreased acromiohumeral space may represent a chronic rotator cuff tear.  IMPRESSION:  No evidence of acute bony abnormality.  Moderate AC joint degenerative changes and possible chronic rotator cuff tear.   Original Report Authenticated By: Harmon Pier, M.D.    Dg Forearm Right  06/29/2012  *RADIOLOGY REPORT*  Clinical Data: Right forearm pain and swelling.  RIGHT FOREARM - 2 VIEW  Comparison: None  Findings: No evidence of acute fracture, subluxation or dislocation identified.  No radio-opaque foreign bodies are present.  No focal bony lesions are noted.  The joint spaces are unremarkable.  Vascular calcifications are noted.  IMPRESSION: No evidence of acute bony abnormality.   Original Report Authenticated By: Harmon Pier, M.D.    Ct Head Wo Contrast  07/01/2012  *RADIOLOGY REPORT*  Clinical Data: Right arm weakness.  Rule out stroke.  CT HEAD WITHOUT CONTRAST  Technique:  Contiguous axial images were obtained from the base of the skull through the vertex without contrast.  Comparison: None.  Findings: Mild atrophy.  Negative for acute infarct.  Negative for hemorrhage or mass lesion.  No fluid collection is present. Calvarium is intact.  IMPRESSION: Mild atrophy.  No acute abnormality.   Original Report Authenticated By: Janeece Riggers, M.D.    Dg Chest [facility]  1 View  07/02/2012  *RADIOLOGY REPORT*  Clinical Data: Advancement of endotracheal tube  PORTABLE CHEST - 1 VIEW  Comparison: Portable exam 0626 hours compared to 07/02/2012 at 0514 hours  Findings: Tip of endotracheal tube now 2.0 cm above carina. Nasogastric tube and right jugular line unchanged. Stable heart size. Persistent atelectasis versus consolidation left  lower lobe. Mild right basilar atelectasis. Upper lungs clear.  IMPRESSION: Tip of endotracheal tube now 2.0 cm above carina. Remainder exam unchanged.   Original Report Authenticated By: Ulyses Southward, M.D.    Dg Chest Port 1 View  07/02/2012  *RADIOLOGY REPORT*  Clinical Data: Follow up atelectasis  PORTABLE CHEST - 1 VIEW  Comparison: Portable exam 0514 hours compared to 07/01/2012  Findings: Tip of endotracheal tube in cervical region, 11.8 cm above carina. Right jugular central venous catheter tip projecting over SVC. Nasogastric tube extends into stomach. Numerous cardiac monitoring leads project over chest. Upper-normal size of cardiac silhouette. Mediastinal contours and pulmonary vascularity normal. Minimal right basilar atelectasis. Slightly increased atelectasis versus consolidation in medial left lower lobe. No definite pleural effusion or pneumothorax.  IMPRESSION: Minimal right basilar atelectasis with increased atelectasis versus consolidation in left lower lobe. High position of endotracheal tube 11.8 cm above carina; may consider advancing tube 6 cm for more stable position within thoracic trachea.   Original Report Authenticated By: Ulyses Southward, M.D.    Dg Chest Port 1 View  07/01/2012  *RADIOLOGY REPORT*  Clinical Data: Acute respiratory failure on ventilator.  Colon carcinoma.  PORTABLE CHEST - 1 VIEW  Comparison: 06/30/2012  Findings: Endotracheal tube remains high in position with the tip approximately 11 cm above the carina.  Both lungs are clear.  Heart size is normal.  Right jugular center venous catheter remains in appropriate position.  IMPRESSION:  1.  High endotracheal tube position, with tip approximately 11 cm above carina. 2.  No active lung disease.   Original Report Authenticated By: Myles Rosenthal, M.D.    Dg Chest Port 1 View  06/30/2012  *RADIOLOGY REPORT*  Clinical Data: Endotracheal placement  PORTABLE CHEST - 1 VIEW  Comparison: Same day  Findings: Endotracheal tip is 9 cm above  the carina, at the thoracic inlet.  Right internal jugular central line is in the SVC at the azygos level.  Minimal atelectasis again noted in the left lower lobe.  No other change.  IMPRESSION: Endotracheal tube somewhat high, 9 cm above the carina at the thoracic inlet.   Original Report Authenticated By: Paulina Fusi, M.D.    Dg Chest Portable 1 View  06/30/2012  *RADIOLOGY REPORT*  Clinical Data: Central line placement.  PORTABLE CHEST - 1 VIEW  Comparison: Chest radiograph performed 06/29/2012  Findings: The lungs are well-aerated.  Mild right basilar opacity may reflect atelectasis or possibly pneumonia.  There is no evidence of pleural effusion or pneumothorax.  The cardiomediastinal silhouette is within normal limits.  No acute osseous abnormalities are seen.  A right IJ line is noted ending about the mid SVC.  IMPRESSION:  1.  Right IJ line noted ending about the mid SVC. 2.  Mild right basilar airspace opacity may reflect atelectasis or possibly pneumonia.   Original Report Authenticated By: Tonia Ghent, M.D.    Dg Foot Complete Left  06/29/2012  *RADIOLOGY REPORT*  Clinical Data: 62 year old male with left foot pain and wounds on left foot.  LEFT FOOT - COMPLETE 3+ VIEW  Comparison: None  Findings: Soft tissue gas and swelling is identified at the  level of the lateral MTP joints. There is no evidence of acute fracture, subluxation or dislocation. The Lisfranc joints are intact. No focal bony lesions are present except for a large plantar calcaneal spur. Vascular calcifications are present.  IMPRESSION: Soft tissue gas and swelling overlying the distal foot suspicious either for open wound or gas forming infection.  No radiographic evidence of osteomyelitis.  No acute bony abnormalities identified.  Calcaneal spur and vascular calcifications.   Original Report Authenticated By: Harmon Pier, M.D.    Anti-infectives: Anti-infectives     Start     Dose/Rate Route Frequency Ordered Stop   06/30/12  2000   cefUROXime (ZINACEF) 1.5 g in dextrose 5 % 50 mL IVPB  Status:  Discontinued        1.5 g 100 mL/hr over 30 Minutes Intravenous Every 12 hours 06/30/12 1954 06/30/12 2001   06/30/12 0645   piperacillin-tazobactam (ZOSYN) IVPB 3.375 g  Status:  Discontinued        3.375 g 100 mL/hr over 30 Minutes Intravenous  Once 06/30/12 0630 06/30/12 0633   06/30/12 0400   vancomycin (VANCOCIN) 500 mg in sodium chloride 0.9 % 100 mL IVPB  Status:  Discontinued        500 mg 100 mL/hr over 60 Minutes Intravenous Every 12 hours 06/29/12 1912 07/01/12 1207   06/29/12 2200   piperacillin-tazobactam (ZOSYN) IVPB 3.375 g        3.375 g 12.5 mL/hr over 240 Minutes Intravenous 3 times per day 06/29/12 1912     06/29/12 1900   clindamycin (CLEOCIN) IVPB 600 mg        600 mg 100 mL/hr over 30 Minutes Intravenous 4 times per day 06/29/12 1847     06/29/12 1500   vancomycin (VANCOCIN) IVPB 1000 mg/200 mL premix        1,000 mg 200 mL/hr over 60 Minutes Intravenous  Once 06/29/12 1454 06/29/12 1752   06/29/12 1500   piperacillin-tazobactam (ZOSYN) IVPB 4.5 g  Status:  Discontinued        4.5 g 200 mL/hr over 30 Minutes Intravenous  Once 06/29/12 1454 06/29/12 1459   06/29/12 1500   piperacillin-tazobactam (ZOSYN) IVPB 3.375 g        3.375 g 100 mL/hr over 30 Minutes Intravenous  Once 06/29/12 1459 06/29/12 1752          Assessment/Plan: s/p Procedure(s) (LRB) with comments: AMPUTATION BELOW KNEE (Left) INTRA OPERATIVE ARTERIOGRAM (Right) AORTOGRAM (Right) - Right upper extremity arch  COMPARTMENT MEASUREMENT (Right) Cont support. Hope to d/c R groin art line post extubation.  Discussed with multiple family present.  Head CT neg yesterday   LOS: 3 days   Tyler Erickson 07/02/2012, 8:19 AM

## 2012-07-03 ENCOUNTER — Encounter (HOSPITAL_COMMUNITY): Payer: Self-pay | Admitting: Vascular Surgery

## 2012-07-03 DIAGNOSIS — R7881 Bacteremia: Secondary | ICD-10-CM

## 2012-07-03 DIAGNOSIS — I96 Gangrene, not elsewhere classified: Secondary | ICD-10-CM

## 2012-07-03 DIAGNOSIS — E43 Unspecified severe protein-calorie malnutrition: Secondary | ICD-10-CM

## 2012-07-03 DIAGNOSIS — E1159 Type 2 diabetes mellitus with other circulatory complications: Secondary | ICD-10-CM

## 2012-07-03 DIAGNOSIS — B950 Streptococcus, group A, as the cause of diseases classified elsewhere: Secondary | ICD-10-CM

## 2012-07-03 DIAGNOSIS — A419 Sepsis, unspecified organism: Principal | ICD-10-CM

## 2012-07-03 LAB — COMPREHENSIVE METABOLIC PANEL
ALT: 31 U/L (ref 0–53)
AST: 73 U/L — ABNORMAL HIGH (ref 0–37)
Albumin: 0.9 g/dL — ABNORMAL LOW (ref 3.5–5.2)
CO2: 21 mEq/L (ref 19–32)
Calcium: 6.8 mg/dL — ABNORMAL LOW (ref 8.4–10.5)
Chloride: 107 mEq/L (ref 96–112)
GFR calc non Af Amer: 32 mL/min — ABNORMAL LOW (ref >90)
Sodium: 141 mEq/L (ref 135–145)

## 2012-07-03 LAB — GLUCOSE, CAPILLARY
Glucose-Capillary: 150 mg/dL — ABNORMAL HIGH (ref 70–99)
Glucose-Capillary: 121 mg/dL — ABNORMAL HIGH (ref 70–99)

## 2012-07-03 LAB — BASIC METABOLIC PANEL
CO2: 23 mEq/L (ref 19–32)
Chloride: 107 mEq/L (ref 96–112)
Creatinine, Ser: 2.12 mg/dL — ABNORMAL HIGH (ref 0.50–1.35)
GFR calc Af Amer: 37 mL/min — ABNORMAL LOW (ref >90)
Potassium: 3.6 mEq/L (ref 3.5–5.1)
Sodium: 141 mEq/L (ref 135–145)

## 2012-07-03 LAB — CBC
MCH: 29.1 pg (ref 26.0–34.0)
Platelets: 85 10*3/uL — ABNORMAL LOW (ref 150–400)
RBC: 3.23 MIL/uL — ABNORMAL LOW (ref 4.22–5.81)
RDW: 14.8 % (ref 11.5–15.5)
WBC: 15.7 10*3/uL — ABNORMAL HIGH (ref 4.0–10.5)

## 2012-07-03 MED ORDER — SODIUM CHLORIDE 0.9 % IJ SOLN
INTRAMUSCULAR | Status: AC
  Administered 2012-07-03: 20 mL
  Filled 2012-07-03: qty 20

## 2012-07-03 MED ORDER — BIOTENE DRY MOUTH MT LIQD
15.0000 mL | Freq: Two times a day (BID) | OROMUCOSAL | Status: DC
  Administered 2012-07-03 – 2012-07-11 (×13): 15 mL via OROMUCOSAL

## 2012-07-03 MED ORDER — PRO-STAT SUGAR FREE PO LIQD
30.0000 mL | Freq: Three times a day (TID) | ORAL | Status: DC
  Administered 2012-07-03 – 2012-07-10 (×19): 30 mL via ORAL
  Filled 2012-07-03 (×27): qty 30

## 2012-07-03 MED ORDER — ALBUMIN HUMAN 25 % IV SOLN
25.0000 g | Freq: Three times a day (TID) | INTRAVENOUS | Status: AC
  Administered 2012-07-03 – 2012-07-04 (×3): 25 g via INTRAVENOUS
  Filled 2012-07-03 (×3): qty 100

## 2012-07-03 MED ORDER — DEXTROSE 5 % IV SOLN
2.0000 g | INTRAVENOUS | Status: DC
  Administered 2012-07-03 – 2012-07-11 (×9): 2 g via INTRAVENOUS
  Filled 2012-07-03 (×11): qty 2

## 2012-07-03 MED ORDER — FUROSEMIDE 10 MG/ML IJ SOLN
80.0000 mg | Freq: Three times a day (TID) | INTRAMUSCULAR | Status: DC
  Administered 2012-07-03 – 2012-07-04 (×5): 80 mg via INTRAVENOUS
  Filled 2012-07-03 (×3): qty 8
  Filled 2012-07-03: qty 12
  Filled 2012-07-03 (×2): qty 8
  Filled 2012-07-03: qty 4
  Filled 2012-07-03: qty 8

## 2012-07-03 MED ORDER — POTASSIUM CHLORIDE CRYS ER 20 MEQ PO TBCR
40.0000 meq | EXTENDED_RELEASE_TABLET | Freq: Three times a day (TID) | ORAL | Status: AC
  Administered 2012-07-03 (×3): 40 meq via ORAL
  Filled 2012-07-03 (×3): qty 2

## 2012-07-03 NOTE — Consult Note (Signed)
[name], [name] NO.:  [id number]  MEDICAL RECORD NO.:  [id number]  LOCATION:  2906                         FACILITY:  [facility]  PHYSICIAN:  Tyler Erickson, M.D.DATE OF BIRTH:  05/18/1951  DATE OF CONSULTATION: DATE OF DISCHARGE:                                CONSULTATION   HISTORY OF PRESENT ILLNESS:  I had a pleasure to see Tyler Erickson at bedside, he is now extubated.  This is the first time I have had a face- to-face conversation with him as he has been intubated.  I was asked to see him at the end of the week by Dr. Dion Erickson due to the fact that there is a possible compartment syndrome.  We felt then and we feel now that he has not had a compartment syndrome. He unfortunately has had a very unusual presentation.  The patient states that 5 days ago, his arm and body just shut down.  He states that 1 week ago (7 days ago), he was feeling fine, everything was working and he did not have problems.  Subsequent to this on Wednesday, which was 5 days ago, his body just shut down.  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  He was quite sick, was on many pressors and had poor blood flow to the upper extremities. During this time, his left upper extremity had fairly good range of motion when he was awake (albeit intubated), but his right upper extremity has not demonstrated good meaningful use.  Today, he relates to me that his whole body just shut down including the right upper extremity.  The right upper extremity now is feeling better. He actually has warmth to the fingers and does not have a clamp down look to his arterial system as he had when he was on the pressors.  He states that he has a poor functional control of the right upper extremity.  He relates that the entire body shut  down on him, not just the right upper extremity.  He has had a CT of the head which was negative, I should note.  In piecing all of this together, certainly his right upper extremity has been significantly affected in terms of its functional motor use, however he has had a very poor circulatory abilities in the arm without compartment syndrome.  The pressors to save his life certainly decrease the flow in his arms and his flow has remained a bit compromised due to multiple medical problems etc., nevertheless at bedside today, his flow was picked up, he has good refill in all finger tips.  On examination, the right and left upper extremity have positive less than 2-3 seconds refill at the tips of the fingers.  He has a good flexion and extension through a small arc about the left hand.  The right upper extremity has 4/5 biceps function.  He has a poor strength, but can fire it against gravity.  He has a 0/5 triceps.  Wrist flexion is 4-/5.  Middle, ring and small finger flexion  is 4-/5.  Thumb and index finger functions absent in terms of flexion, extension and wrist extension is 0/5 actively.  He states he did have some intermittent shoulder discomfort on Wednesday, but does not have any now.  I have examined the arm, he once again does not have any signs of compartment syndrome or infection in our opinion.  I have reviewed this at length. The left upper extremity, similarly does not have any obvious signs of a significant infectious process.  IMPRESSION: 19. A 62 year old white male with multiple medical problems including     sepsis, acute onset and subsequent need for emergent left lower     extremity amputation due to sepsis.  I should note his blood     cultures growing out group A strep, isolate. 2. Poor right upper extremity motor function, as described above x5     days without any obvious compartment syndrome or deep infectious     process. 3. Advanced peripheral vascular  disease.  RECOMMENDATIONS:  I have discussed with the patient all issues at present time.  He does not give a obvious picture of an acute compartment syndrome or deep infection in the right upper extremity.  It is a very interesting presentation.  I do feel and concur with Dr. Darrick Erickson and Dr. Dion Erickson that his vascular integrity is quite poor and this may simply be the result of the sepsis working on his poor circulatory abilities thus resulting in marked neurovascular compromise.  Certainly one has to consider in patient such as acute Parson[place]-Turner syndrome and CVA issues.  Given the fact that the patient describes a painless shutdown of the arm, one certainly would query whether this could be an embolic or a vascular phenomenon that subsequently led to worsening function in the extremities.  At present time, I would not recommend any acute surgical intervention. I would recommend therapy (occupational therapy) for the right upper extremity.  He can certainly push through mild passive and mild resistance attempts today with finger flexion and other measures, and thus rehabilitation is going to be quite the key for him.  I feel that certainly the therapeutic __________, and wait and observe approaches to rule in this situation.  Certainly he gives an unusual presentation, but I feel this is likely a septic and vascular issue.  The patient and I had a long discussion at bedside.  I would not recommend any aggressive surgical intervention for the arm at this time, but unfortunately a wait and see approach given of his significant medical problems.  I have discussed this with Tyler Erickson, will be available of course for any questions or concerns.  Ultimately in a setting where the patient is stable and more healthy, certainly consideration for nerve studies would be in order to try and give some prognostic indication of how the nerves will do.  I feel his radial nerve and median nerve have  significant limitations at present time, as demonstrated on the exam.  However, given the global weakness one would have to question a higher level of injury than simply the wrist or forearm.  I have discussed this with Tyler Erickson to the best of my abilities and common sense terms in approaches.     Tyler Erickson, M.D.     [facility]  D:  07/03/2012  T:  07/03/2012  Job:  [id number]

## 2012-07-03 NOTE — Progress Notes (Signed)
Orthopedic Tech Progress Note Patient Details:  Tyler Erickson 07-23-1950 [id number]  Patient ID: Tyler Erickson, male   DOB: 04-21-1951, 62 y.o.   MRN: [id number]   Tyler Erickson 07/03/2012, 12:26 PM CALLED [facility] FOR LEFT BKA.

## 2012-07-03 NOTE — Progress Notes (Signed)
Pt admitted to 3300 from 2900.  Oriented to unit, room, and call bell - placed at pt's side.  VSS.  Roselie Awkward, RN

## 2012-07-03 NOTE — Consult Note (Addendum)
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  Wound care f/u of his stump and his R elbow. Diabetic teaching and IM f/u.   recheck his BCx to assure clearance.   Comment- since his infected site (foot) has been removed, would treat him as bacteremia. Would watch his WBC, his bowels feel full despite him saying he has been having good BM.   Thank you so much for this interesting consult,   Tyler Erickson [phone]  Tyler Erickson is an 62 y.o. male.  HPI: 62 yo M with hx of DM (out of care for 2 years) adm to hospital on 06-29-12 with LLE discoloration and pain. He was fell to have "wet gangrene" of his L foot, necrotic toes 1 & 2.  He was started on vanco/clinda/zosyn. 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 Post-op he arrested in the PACU and required intubation and pressor use. He also underwent eval of his R shoulder: felt to be due to vascular disease . His course was further complicated by acute on chronic renal failure. He was able to be extubated by 07-02-12.  His BCx from admission grew Group A streptococci.    Past Medical History  Diagnosis Date  . ADENOCARCINOMA, COLON     S/P surgery/chemotherapy  . DM   . NEUROPATHY   . Kidney stones   . Large bowel obstruction   . Ischemic colon     Past Surgical History  Procedure Date  . 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 Ileostomy closure 10/31/2009  . Porta-cath insertion 08/08/2008     No Known Allergies  Medications:  Scheduled:   . albumin human  25 g Intravenous Q8H  . antiseptic oral rinse  15 mL Mouth Rinse BID  . clindamycin (CLEOCIN) IV  600 mg Intravenous Q6H  . feeding supplement  30 mL Oral TID WC  . furosemide  80 mg Intravenous Q8H  .  heparin subcutaneous  5,000 Units Subcutaneous Q8H  . insulin aspart  0-15 Units Subcutaneous TID WC  . insulin glargine  10 Units Subcutaneous Daily  . piperacillin-tazobactam (ZOSYN)  IV  3.375 g Intravenous Q8H  . potassium chloride  40 mEq Oral TID  . sodium chloride  3 mL Intravenous Q12H    Total days of antibiotics 5 (zosyn/clinda)          Social History:  reports that he has been smoking Cigars.  He has never used smokeless tobacco. He reports that he does not drink alcohol or use illicit drugs.  Family History  Problem Relation Age of Onset  . Diabetes Mother   . Diabetes Father   . Heart failure Mother   . Heart failure Father   . Stroke Father   . Diabetes Sister   . Diabetes Sister   . Diabetes Brother     General ROS: no change in vision, good apetite, normal BM, normal urination, see HPI  Blood pressure 120/67, pulse 88, temperature 97.8 F (36.6 C), temperature source Oral, resp. rate 21, height 5\' 8"  (1.727 m), weight 79.7 kg (175 lb 11.3 oz), SpO2 94.00%. General appearance: alert, cooperative and no distress Eyes: negative findings: pupils equal, round, reactive to light and accomodation Throat: normal findings: oropharynx pink & moist without lesions or evidence of  thrush Neck: no adenopathy, supple, symmetrical, trachea midline and R IJ clean Lungs: clear to auscultation bilaterally Heart: regular rate and rhythm Abdomen: normal findings: bowel sounds normal and soft, non-tender Extremities: edema none. he has a clean wound on his LLE stump. he has blistering on his L meidal elbow. he has a superfical wound on his R great toe. his R sholder is firm. his RUE is wrapped.    Results for orders placed during the hospital encounter of 06/29/12 (from the past 48 hour(s))  GLUCOSE, CAPILLARY     Status: Abnormal   Collection Time   07/01/12  4:13 PM      Component Value Range Comment   Glucose-Capillary 119 (*) 70 - 99 mg/dL   GLUCOSE, CAPILLARY     Status:  Normal   Collection Time   07/01/12  8:28 PM      Component Value Range Comment   Glucose-Capillary 86  70 - 99 mg/dL   GLUCOSE, CAPILLARY     Status: Normal   Collection Time   07/01/12  8:32 PM      Component Value Range Comment   Glucose-Capillary 78  70 - 99 mg/dL   GLUCOSE, CAPILLARY     Status: Normal   Collection Time   07/01/12 11:20 PM      Component Value Range Comment   Glucose-Capillary 87  70 - 99 mg/dL   BLOOD GAS, ARTERIAL     Status: Abnormal   Collection Time   07/02/12  4:14 AM      Component Value Range Comment   FIO2 0.40      Delivery systems VENTILATOR      Mode PRESSURE REGULATED VOLUME CONTROL      VT 650      Rate 16      Peep/cpap 5.0      pH, Arterial 7.487 (*) 7.350 - 7.450    pCO2 arterial 27.8 (*) 35.0 - 45.0 mmHg    pO2, Arterial 181.0 (*) 80.0 - 100.0 mmHg    Bicarbonate 20.8  20.0 - 24.0 mEq/L    TCO2 21.7  0 - 100 mmol/L    Acid-base deficit 2.1 (*) 0.0 - 2.0 mmol/L    O2 Saturation 99.5      Patient temperature 98.6      Collection site A-LINE      Drawn by [id number]      Sample type ARTERIAL DRAW      Allens test (pass/fail) PASS  PASS   GLUCOSE, CAPILLARY     Status: Abnormal   Collection Time   07/02/12  4:14 AM      Component Value Range Comment   Glucose-Capillary 112 (*) 70 - 99 mg/dL   CBC     Status: Abnormal   Collection Time   07/02/12  4:58 AM      Component Value Range Comment   WBC 12.6 (*) 4.0 - 10.5 K/uL    RBC 3.16 (*) 4.22 - 5.81 MIL/uL    Hemoglobin 8.9 (*) 13.0 - 17.0 g/dL    HCT 16.1 (*) 09.6 - 52.0 %    MCV 80.7  78.0 - 100.0 fL    MCH 28.2  26.0 - 34.0 pg    MCHC 34.9  30.0 - 36.0 g/dL    RDW 04.5  40.9 - 81.1 %    Platelets    150 - 400 K/uL    Value: PLATELET CLUMPS NOTED ON SMEAR, COUNT APPEARS DECREASED  COMPREHENSIVE METABOLIC PANEL  Status: Abnormal   Collection Time   07/02/12  4:58 AM      Component Value Range Comment   Sodium 139  135 - 145 mEq/L    Potassium 3.3 (*) 3.5 - 5.1 mEq/L    Chloride 106  96 -  112 mEq/L    CO2 20  19 - 32 mEq/L    Glucose, Bld 129 (*) 70 - 99 mg/dL    BUN 69 (*) 6 - 23 mg/dL    Creatinine, Ser 1.61 (*) 0.50 - 1.35 mg/dL    Calcium 6.7 (*) 8.4 - 10.5 mg/dL    Total Protein 5.0 (*) 6.0 - 8.3 g/dL    Albumin 0.9 (*) 3.5 - 5.2 g/dL    AST 91 (*) 0 - 37 U/L    ALT 30  0 - 53 U/L    Alkaline Phosphatase 73  39 - 117 U/L    Total Bilirubin 2.6 (*) 0.3 - 1.2 mg/dL    GFR calc non Af Amer 31 (*) >90 mL/min    GFR calc Af Amer 35 (*) >90 mL/min   MAGNESIUM     Status: Normal   Collection Time   07/02/12  4:58 AM      Component Value Range Comment   Magnesium 2.4  1.5 - 2.5 mg/dL   PHOSPHORUS     Status: Normal   Collection Time   07/02/12  4:58 AM      Component Value Range Comment   Phosphorus 2.8  2.3 - 4.6 mg/dL   GLUCOSE, CAPILLARY     Status: Abnormal   Collection Time   07/02/12  8:19 AM      Component Value Range Comment   Glucose-Capillary 133 (*) 70 - 99 mg/dL    Comment 1 Documented in Chart      Comment 2 Notify RN     CREATININE, URINE, RANDOM     Status: Normal   Collection Time   07/02/12  8:20 AM      Component Value Range Comment   Creatinine, Urine 123.01     SODIUM, URINE, RANDOM     Status: Normal   Collection Time   07/02/12  8:20 AM      Component Value Range Comment   Sodium, Ur 10     GLUCOSE, CAPILLARY     Status: Abnormal   Collection Time   07/02/12 11:49 AM      Component Value Range Comment   Glucose-Capillary 149 (*) 70 - 99 mg/dL    Comment 1 Notify RN      Comment 2 Documented in Chart     GLUCOSE, CAPILLARY     Status: Abnormal   Collection Time   07/02/12  4:12 PM      Component Value Range Comment   Glucose-Capillary 175 (*) 70 - 99 mg/dL    Comment 1 Documented in Chart      Comment 2 Notify RN     GLUCOSE, CAPILLARY     Status: Abnormal   Collection Time   07/02/12  7:32 PM      Component Value Range Comment   Glucose-Capillary 152 (*) 70 - 99 mg/dL   CBC     Status: Abnormal   Collection Time   07/03/12  7:17 AM       Component Value Range Comment   WBC 15.7 (*) 4.0 - 10.5 K/uL    RBC 3.23 (*) 4.22 - 5.81 MIL/uL    Hemoglobin 9.4 (*) 13.0 -  17.0 g/dL    HCT 82.9 (*) 56.2 - 52.0 %    MCV 83.3  78.0 - 100.0 fL    MCH 29.1  26.0 - 34.0 pg    MCHC 34.9  30.0 - 36.0 g/dL    RDW 13.0  86.5 - 78.4 %    Platelets 85 (*) 150 - 400 K/uL PLATELET COUNT CONFIRMED BY SMEAR  COMPREHENSIVE METABOLIC PANEL     Status: Abnormal   Collection Time   07/03/12  7:19 AM      Component Value Range Comment   Sodium 141  135 - 145 mEq/L    Potassium 3.7  3.5 - 5.1 mEq/L    Chloride 107  96 - 112 mEq/L    CO2 21  19 - 32 mEq/L    Glucose, Bld 165 (*) 70 - 99 mg/dL    BUN 73 (*) 6 - 23 mg/dL    Creatinine, Ser 6.96 (*) 0.50 - 1.35 mg/dL    Calcium 6.8 (*) 8.4 - 10.5 mg/dL    Total Protein 5.9 (*) 6.0 - 8.3 g/dL    Albumin 0.9 (*) 3.5 - 5.2 g/dL    AST 73 (*) 0 - 37 U/L    ALT 31  0 - 53 U/L    Alkaline Phosphatase 152 (*) 39 - 117 U/L    Total Bilirubin 2.6 (*) 0.3 - 1.2 mg/dL    GFR calc non Af Amer 32 (*) >90 mL/min    GFR calc Af Amer 38 (*) >90 mL/min   GLUCOSE, CAPILLARY     Status: Abnormal   Collection Time   07/03/12  7:41 AM      Component Value Range Comment   Glucose-Capillary 163 (*) 70 - 99 mg/dL    Comment 1 Documented in Chart      Comment 2 Notify RN     GLUCOSE, CAPILLARY     Status: Abnormal   Collection Time   07/03/12 11:22 AM      Component Value Range Comment   Glucose-Capillary 150 (*) 70 - 99 mg/dL    Comment 1 Documented in Chart      Comment 2 Notify RN         Component Value Date/Time   SDES URINE, CLEAN CATCH 06/29/2012 1601   SPECREQUEST NONE 06/29/2012 1601   CULT NO GROWTH 06/29/2012 1601   REPTSTATUS 07/01/2012 FINAL 06/29/2012 1601   Ct Head Wo Contrast  07/01/2012  *RADIOLOGY REPORT*  Clinical Data: Right arm weakness.  Rule out stroke.  CT HEAD WITHOUT CONTRAST  Technique:  Contiguous axial images were obtained from the base of the skull through the vertex without contrast.   Comparison: None.  Findings: Mild atrophy.  Negative for acute infarct.  Negative for hemorrhage or mass lesion.  No fluid collection is present. Calvarium is intact.  IMPRESSION: Mild atrophy.  No acute abnormality.   Original Report Authenticated By: Janeece Riggers, M.D.    Dg Chest Port 1 View  07/02/2012  *RADIOLOGY REPORT*  Clinical Data: Advancement of endotracheal tube  PORTABLE CHEST - 1 VIEW  Comparison: Portable exam 0626 hours compared to 07/02/2012 at 0514 hours  Findings: Tip of endotracheal tube now 2.0 cm above carina. Nasogastric tube and right jugular line unchanged. Stable heart size. Persistent atelectasis versus consolidation left lower lobe. Mild right basilar atelectasis. Upper lungs clear.  IMPRESSION: Tip of endotracheal tube now 2.0 cm above carina. Remainder exam unchanged.   Original Report Authenticated By: Ulyses Southward, M.D.  Dg Chest Port 1 View  07/02/2012  *RADIOLOGY REPORT*  Clinical Data: Follow up atelectasis  PORTABLE CHEST - 1 VIEW  Comparison: Portable exam 0514 hours compared to 07/01/2012  Findings: Tip of endotracheal tube in cervical region, 11.8 cm above carina. Right jugular central venous catheter tip projecting over SVC. Nasogastric tube extends into stomach. Numerous cardiac monitoring leads project over chest. Upper-normal size of cardiac silhouette. Mediastinal contours and pulmonary vascularity normal. Minimal right basilar atelectasis. Slightly increased atelectasis versus consolidation in medial left lower lobe. No definite pleural effusion or pneumothorax.  IMPRESSION: Minimal right basilar atelectasis with increased atelectasis versus consolidation in left lower lobe. High position of endotracheal tube 11.8 cm above carina; may consider advancing tube 6 cm for more stable position within thoracic trachea.   Original Report Authenticated By: Ulyses Southward, M.D.    Recent Results (from the past 240 hour(s))  CULTURE, BLOOD (ROUTINE X 2)     Status: Normal    Collection Time   06/29/12  2:15 PM      Component Value Range Status Comment   Specimen Description BLOOD RIGHT HAND   Final    Special Requests BOTTLES DRAWN AEROBIC AND ANAEROBIC 4CC EACH   Final    Culture  Setup Time 06/29/2012 21:41   Final    Culture     Final    Value: GROUP A STREP (S.PYOGENES) ISOLATED     Note: CRITICAL RESULT CALLED TO, READ BACK BY AND VERIFIED WITH: CHRIS ARMITAGE 07/01/12 @ 10:45AM BY RUSCA.     Note: Gram Stain Report Called to,Read Back By and Verified With: TERESA Antony Salmon 06/30/12 1040 BY SMITHERSJ   Report Status 07/01/2012 FINAL   Final   CULTURE, BLOOD (ROUTINE X 2)     Status: Normal   Collection Time   06/29/12  2:20 PM      Component Value Range Status Comment   Specimen Description BLOOD RIGHT THUMB   Final    Special Requests BOTTLES DRAWN AEROBIC ONLY 3CC   Final    Culture  Setup Time 06/29/2012 21:40   Final    Culture     Final    Value: GROUP A STREP (S.PYOGENES) ISOLATED     Note: CRITICAL RESULT CALLED TO, READ BACK BY AND VERIFIED WITH: CHRIS ARMITAGE 07/01/12 @ 10:45AM BY RUSCA.     Note: PAM ALVORD 06/30/12 1135 BY SMITHERSJ   Report Status 07/01/2012 FINAL   Final   URINE CULTURE     Status: Normal   Collection Time   06/29/12  4:01 PM      Component Value Range Status Comment   Specimen Description URINE, CLEAN CATCH   Final    Special Requests NONE   Final    Culture  Setup Time 06/30/2012 02:24   Final    Colony Count NO GROWTH   Final    Culture NO GROWTH   Final    Report Status 07/01/2012 FINAL   Final   MRSA PCR SCREENING     Status: Normal   Collection Time   06/29/12  8:56 PM      Component Value Range Status Comment   MRSA by PCR NEGATIVE  NEGATIVE Final   SURGICAL PCR SCREEN     Status: Normal   Collection Time   06/30/12 10:08 AM      Component Value Range Status Comment   MRSA, PCR NEGATIVE  NEGATIVE Final    Staphylococcus aureus NEGATIVE  NEGATIVE Final  07/03/2012, 1:32 PM     LOS: 4 days

## 2012-07-03 NOTE — Progress Notes (Signed)
NUTRITION FOLLOW UP/consult  Intervention:   1. 30 ml Pro-stat TID, each supplement provides 100 kcal and 15 gm protein. May mix with food (applesauce) or beverage as needed to improve taste.  2. RD will continue to follow   Nutrition Dx:   Inadequate oral intake now related to poor appetite  as evidenced by 5% meal completion.   Goal:   Meet >/=90% estimated nutrition needs. Unmet  Monitor:   Po intake, supplement tolerance, weight trends, I/O's  Assessment:   Pt was extubated on 1/5. TF off with extubation.  Now with edema in upper and lower extremities. Net +10 L this admission.  RD consulted for assessment of nutrition needs and wound healing.  Pt states he has very little appetite, feels very full and bloated. Likely related to fluid status. Pt also with hx of colon cancer and states decreased transit time.  Pt agreeable to try Pro-stat TID. Encouraged pt to drink the supplement or mix with a beverage. Explained the role of protein in wound healing. Encouraged pt to eat protein foods first with meals.   Height: Ht Readings from Last 1 Encounters:  06/30/12 5\' 8"  (1.727 m)    Weight Status:   Wt Readings from Last 1 Encounters:  07/03/12 175 lb 11.3 oz (79.7 kg)  admission weight 151 lbs.   Re-estimated needs:  Kcal: 2050-2300 Protein: 100-115 gm  Fluid: per team   Skin: recent BKA, left groin incision, L elbow incision  Diet Order: Carb Control PO intake: 5% last meal   Intake/Output Summary (Last 24 hours) at 07/03/12 1051 Last data filed at 07/03/12 1000  Gross per 24 hour  Intake   3590 ml  Output    960 ml  Net   2630 ml    Last BM: 1/6   Labs:   Lab 07/03/12 0719 07/02/12 0458 07/01/12 0635 06/29/12 1420  NA 141 139 140 --  K 3.7 3.3* 3.4* --  CL 107 106 107 --  CO2 21 20 20  --  BUN 73* 69* 61* --  CREATININE 2.11* 2.21* 1.80* --  CALCIUM 6.8* 6.7* 6.2* --  MG -- 2.4 2.1 2.6*  PHOS -- 2.8 1.9* --  GLUCOSE 165* 129* 214* --   Albumin    Date Value Range Status  07/03/2012 0.9* 3.5 - 5.2 g/dL Final  07/03/1094 0.9* 3.5 - 5.2 g/dL Final  0/09/5407 1.0* 3.5 - 5.2 g/dL Final    CBG (last 3)   Basename 07/03/12 0741 07/02/12 1932 07/02/12 1612  GLUCAP 163* 152* 175*    Scheduled Meds:   . albumin human  25 g Intravenous Q8H  . antiseptic oral rinse  15 mL Mouth Rinse BID  . clindamycin (CLEOCIN) IV  600 mg Intravenous Q6H  . furosemide  80 mg Intravenous Q8H  . heparin subcutaneous  5,000 Units Subcutaneous Q8H  . insulin aspart  0-15 Units Subcutaneous TID WC  . insulin glargine  10 Units Subcutaneous Daily  . piperacillin-tazobactam (ZOSYN)  IV  3.375 g Intravenous Q8H  . potassium chloride  40 mEq Oral TID  . sodium chloride  3 mL Intravenous Q12H    Continuous Infusions:   . sodium chloride Stopped (07/01/12 1200)  . sodium chloride Stopped (07/03/12 1000)  . sodium chloride Stopped (07/01/12 1600)    Clarene Duke RD, LDN Pager [phone] After Hours pager [phone]

## 2012-07-03 NOTE — Progress Notes (Signed)
Patient ID: Tyler Erickson, male   DOB: 03/18/1951, 62 y.o.   MRN: [id number]  Full note dictated [id number] Patient seen and examined Dominica Severin MD

## 2012-07-03 NOTE — Progress Notes (Addendum)
[facility]   Postoperative Visit - Amputation  Date of Surgery: 06/29/2012 - 06/30/2012 Procedure(s): AMPUTATION BELOW KNEE Left INTRA OPERATIVE ARTERIOGRAM AORTOGRAM COMPARTMENT MEASUREMENT right Surgeon: Surgeon(s): Sherren Kerns, MD Eulas Post, MD POD: 3 Days Post-Op  Subjective Tyler Erickson is a 62 y.o. male who is S/P Left Procedure(s): AMPUTATION BELOW KNEE INTRA OPERATIVE ARTERIOGRAM AORTOGRAM COMPARTMENT MEASUREMENT.  Pt.denies increased pain in the stump. The patient notes pain is well controlled. Pt. denies phantom pain.  Significant Diagnostic Studies: CBC Lab Results  Component Value Date   WBC 12.6* 07/02/2012   HGB 8.9* 07/02/2012   HCT 25.5* 07/02/2012   MCV 80.7 07/02/2012   PLT PLATELET CLUMPS NOTED ON SMEAR, COUNT APPEARS DECREASED 07/02/2012    BMET    Component Value Date/Time   NA 139 07/02/2012 0458   K 3.3* 07/02/2012 0458   CL 106 07/02/2012 0458   CO2 20 07/02/2012 0458   GLUCOSE 129* 07/02/2012 0458   BUN 69* 07/02/2012 0458   CREATININE 2.21* 07/02/2012 0458   CALCIUM 6.7* 07/02/2012 0458   GFRNONAA 31* 07/02/2012 0458   GFRAA 35* 07/02/2012 0458    COAG Lab Results  Component Value Date   INR 1.40 06/30/2012   INR 1.41 06/29/2012   INR 1.07 10/28/2009   No results found for this basename: PTT     Intake/Output Summary (Last 24 hours) at 07/03/12 0749 Last data filed at 07/03/12 0600  Gross per 24 hour  Intake 3990.5 ml  Output   1085 ml  Net 2905.5 ml   Patient Vitals for the past 24 hrs:  Urine Occurrence Stool Color  07/03/12 0600 1  -  07/03/12 0200 1  Brown  07/02/12 2000 - Brown  07/02/12 1700 - Brown  07/02/12 1400 - Brown     Physical Examination  BP Readings from Last 3 Encounters:  07/03/12 111/63  07/03/12 111/63  03/17/09 118/64   Temp Readings from Last 3 Encounters:  07/03/12 97.8 F (36.6 C) Oral  07/03/12 97.8 F (36.6 C) Oral   SpO2 Readings from Last 3 Encounters:  07/03/12 97%    07/03/12 97%   Pulse Readings from Last 3 Encounters:  07/03/12 82  07/03/12 82  03/17/09 72    Pt is A&Ox3  WDWN male with no complaints  Left amputation wound is clean, dry and healing well.  There is good bone coverage in the stump Stump is warm and well perfused, without drainage; without erythema Right hand with good cap refill and very min. Active motion.  Assessment/plan:  Tyler Erickson is a 62 y.o. male who is s/p Left Procedure(s): AMPUTATION BELOW KNEE INTRA OPERATIVE ARTERIOGRAM AORTOGRAM COMPARTMENT MEASUREMENT  The patient's stump is clean, dry, intact or viable.  Follow-up 4 weeks from surgery Consult biotech today Clinton Gallant [facility] 7:49 AM 07/03/[phone]    Right hand pink and warm but only able to move fingers a little Left BKA healing so far Will continue to follow  Fabienne Bruns, MD [facility] [place] Office: [phone] Pager: [phone]

## 2012-07-03 NOTE — Progress Notes (Signed)
PULMONARY  / CRITICAL CARE MEDICINE  Name: Tyler Erickson MRN: [id number] DOB: August 18, 1950    LOS: 4  REFERRING MD :  TRH  CHIEF COMPLAINT:  Foot gangrene, shock  BRIEF PATIENT DESCRIPTION:  62 yo male smoker admitted 06/29/2012 with chills and Lt foot pain from Lt foot gangrene and septic shock.  Significant PMHx of DM, Neuropathy, Colon cancer Stage III  LINES / TUBES: 1/03 R IJ CVL >> 1/03 ETT >> 1/5 1/03 Rt femoral Aline >>  CULTURES: 1/2 blood >> GPC in chains >> Group A strep sens pending.  1/2 urine >> negative  ANTIBIOTICS: 1/2 clindamycin >> 1/2 zosyn >> 1/3 vancomycin >> 1/3  EVENTS:  1/03 Transfer from [facility] to [facility] ICU with septic shock, c/o Rt arm pain 1/03 Lt BKA, RUE arteriogram 1/03 Respiratory arrest, brief CPR, re-intubated in PACU, intact mental status 1/03 Compartment measurements, right upper extremity, volar and dorsal forearm 1/5 extubated   TESTS: 1/03 RUE arteriogram >> Normal arch anatomy, diffusely atherosclerotic right upper extremity arterial tree unreconstructable  LEVEL OF CARE:  ICU PRIMARY SERVICE:  PCCM CONSULTANTS:  Vascular (Dr. Darrick Penna), Ortho (Dr. Amanda Pea) CODE STATUS: full DIET:  SQ heparin DVT Px:  SQ heparin GI Px: Protonix  INTERVAL HISTORY:  Extubated yesterday and doing well.  Hand surgeon saw today and will order rehab and observation for now.    VITAL SIGNS: Temp:  [97.8 F (36.6 C)-99.5 F (37.5 C)] 97.8 F (36.6 C) (01/06 0400) Pulse Rate:  [82-106] 82  (01/06 0600) Resp:  [8-34] 18  (01/06 0600) BP: (94-113)/(57-65) 111/63 mmHg (01/06 0600) SpO2:  [90 %-100 %] 97 % (01/06 0600) Arterial Line BP: (112-122)/(56-62) 121/62 mmHg (01/05 1300) FiO2 (%):  [30 %] 30 % (01/05 0800) Weight:  [175 lb 11.3 oz (79.7 kg)] 175 lb 11.3 oz (79.7 kg) (01/06 0452) HEMODYNAMICS: CVP:  [11 mmHg-13 mmHg] 13 mmHg VENTILATOR SETTINGS: Vent Mode:  [-] CPAP;PSV FiO2 (%):  [30 %] 30 % PEEP:  [5 cmH20] 5 cmH20 Pressure Support:  [10  cmH20] 10 cmH20 INTAKE / OUTPUT: Intake/Output      01/05 0701 - 01/06 0700 01/06 0701 - 01/07 0700   P.O. 480    I.V. (mL/kg) 2253 (28.3)    NG/GT 285    IV Piggyback 312.5    Total Intake(mL/kg) 3330.5 (41.8)    Urine (mL/kg/hr) 1085 (0.6)    Total Output 1085    Net +2245.5         Urine Occurrence 2 x    Stool Occurrence 2 x     PHYSICAL EXAMINATION:  Gen: No distress, denies pain this morning.   HEENT: no scleral icterus, PERRLA and EOMI PULM: bibasilar rales.   CV: s1s2 regular AB: soft, non tender Ext: Lt BKA site less painful this morning.  Rt arm in sling and wrap.  Able to move fingers bilaterally and right toes.  2 cm dry lesion on the medial tip of the right great toe with no exudate. Scrotal swelling this AM.  2+ pitting edema in legs and +1 pitting edema in the arms.  Derm: no rash Neuro: moves all 4 extremities.  Weakness in the bilateral hands, right hand weaker then left.    LABS: Cbc  Lab 07/02/12 0458 07/01/12 0635 06/30/12 0609  WBC 12.6* -- --  HGB 8.9* 10.2* 11.8*  HCT 25.5* 28.8* 33.6*  PLT PLATELET CLUMPS NOTED ON SMEAR, COUNT APPEARS DECREASED 113* 111*   Chemistry   Lab 07/02/12 0458 07/01/12  1610 06/30/12 2201 06/29/12 1420  NA 139 140 137 --  K 3.3* 3.4* 3.2* --  CL 106 107 104 --  CO2 20 20 15* --  BUN 69* 61* 55* --  CREATININE 2.21* 1.80* 1.67* --  CALCIUM 6.7* 6.2* 6.4* --  MG 2.4 2.1 -- 2.6*  PHOS 2.8 1.9* -- --  GLUCOSE 129* 214* 251* --   Liver fxn  Lab 07/02/12 0458 07/01/12 0635 06/30/12 0609  AST 91* 154* 66*  ALT 30 46 12  ALKPHOS 73 49 46  BILITOT 2.6* 1.8* 1.2  PROT 5.0* 4.5* 4.7*  ALBUMIN 0.9* 1.0* 1.2*   coags  Lab 06/30/12 0935 06/29/12 1420  APTT 34 --  INR 1.40 1.41   Sepsis markers  Lab 07/01/12 0635 07/01/12 0630 06/30/12 0609 06/29/12 1421 06/29/12 1355  LATICACIDVEN -- 4.5* 3.2* 6.7* --  PROCALCITON 41.85 -- 45.10 -- 39.83   Cardiac markers  Lab 06/30/12 0609  CKTOTAL --  CKMB --  TROPONINI  <0.30   BNP No results found for this basename: PROBNP:3 in the last 168 hours ABG  Lab 07/02/12 0414 07/01/12 0425 06/30/12 1700  PHART 7.487* 7.396 7.304*  PCO2ART 27.8* 40.7 29.1*  PO2ART 181.0* 153.0* 336.0*  HCO3 20.8 24.5* 14.0*  TCO2 21.7 25.7 14.9   CBG trend  Lab 07/02/12 1932 07/02/12 1612 07/02/12 1149 07/02/12 0819 07/02/12 0414  GLUCAP 152* 175* 149* 133* 112*   IMAGING:  Ct Head Wo Contrast  07/01/2012  *RADIOLOGY REPORT*  Clinical Data: Right arm weakness.  Rule out stroke.  CT HEAD WITHOUT CONTRAST  Technique:  Contiguous axial images were obtained from the base of the skull through the vertex without contrast.  Comparison: None.  Findings: Mild atrophy.  Negative for acute infarct.  Negative for hemorrhage or mass lesion.  No fluid collection is present. Calvarium is intact.  IMPRESSION: Mild atrophy.  No acute abnormality.   Original Report Authenticated By: Janeece Riggers, M.D.    Dg Chest Port 1 View  07/02/2012  *RADIOLOGY REPORT*  Clinical Data: Advancement of endotracheal tube  PORTABLE CHEST - 1 VIEW  Comparison: Portable exam 0626 hours compared to 07/02/2012 at 0514 hours  Findings: Tip of endotracheal tube now 2.0 cm above carina. Nasogastric tube and right jugular line unchanged. Stable heart size. Persistent atelectasis versus consolidation left lower lobe. Mild right basilar atelectasis. Upper lungs clear.  IMPRESSION: Tip of endotracheal tube now 2.0 cm above carina. Remainder exam unchanged.   Original Report Authenticated By: Ulyses Southward, M.D.    Dg Chest Port 1 View  07/02/2012  *RADIOLOGY REPORT*  Clinical Data: Follow up atelectasis  PORTABLE CHEST - 1 VIEW  Comparison: Portable exam 0514 hours compared to 07/01/2012  Findings: Tip of endotracheal tube in cervical region, 11.8 cm above carina. Right jugular central venous catheter tip projecting over SVC. Nasogastric tube extends into stomach. Numerous cardiac monitoring leads project over chest. Upper-normal  size of cardiac silhouette. Mediastinal contours and pulmonary vascularity normal. Minimal right basilar atelectasis. Slightly increased atelectasis versus consolidation in medial left lower lobe. No definite pleural effusion or pneumothorax.  IMPRESSION: Minimal right basilar atelectasis with increased atelectasis versus consolidation in left lower lobe. High position of endotracheal tube 11.8 cm above carina; may consider advancing tube 6 cm for more stable position within thoracic trachea.   Original Report Authenticated By: Ulyses Southward, M.D.    Dg Chest Port 1 View  07/01/2012  *RADIOLOGY REPORT*  Clinical Data: Acute respiratory failure on ventilator.  Colon carcinoma.  PORTABLE CHEST - 1 VIEW  Comparison: 06/30/2012  Findings: Endotracheal tube remains high in position with the tip approximately 11 cm above the carina.  Both lungs are clear.  Heart size is normal.  Right jugular center venous catheter remains in appropriate position.  IMPRESSION:  1.  High endotracheal tube position, with tip approximately 11 cm above carina. 2.  No active lung disease.   Original Report Authenticated By: Myles Rosenthal, M.D.    ASSESSMENT / PLAN:  PULMONARY  ASSESSMENT: Acute respiratory failure post-op in setting of septic shock >> likely from residual effects of anesthesia: extubated yesterday and doing well.  Unable to wean from oxygen.  +10L since admission which may be the cause.   PLAN:   -Lasix 40 mg once, assess output  CARDIOVASCULAR  ASSESSMENT:  Septic shock 2nd to Lt foot gangrene.  Cortisol 60.2 from 1/03. Rt arm arterial disease PLAN:  Continue to follow CVPs for volume.  Will likely require some bolus because of rising creatinine.   RENAL  ASSESSMENT:   Acute on chronic renal failure: secondary to shock.  Creatinine 2.21 today up from 1.8,  Baseline 1.1-1.3.  Other differential include prerenal azotemia (increasing BUN as well) vs. Vancomycin induced AIN vs Ischemic ATN.  UOP over a liter  yesterday.  Urine lytes indicate prerenal with Urine Na of 10.  Likely from hypoalbuminemia.  Anasarca: marked swelling today in scrotum, legs, abdomen, and arms.  Albumin low 0.9 likely very fluid overloaded even with increased Creatinine we may need to diurese him Metabolic acidosis: Resolved.  Hypokalemia: Replaced.   PLAN:   Lasix 40 mg IV once today. Consider Albumin infusion Consult nutrition for supplement Monitor renal fx, urine outpt, electrolytes  GASTROINTESTINAL  ASSESSMENT:   Severe Protein calorie malnutrition:  Will need protein supplements to support wound healing as well as resolution of the anasarca.    PLAN:   - consult nutrition for supplement and dietary suggestions.   HEMATOLOGIC  ASSESSMENT:   Anemia, thrombocytopenia of critical illness. PLAN:  F/u CBC Transfuse for Hb < 7  INFECTIOUS  ASSESSMENT:   Lt foot gangrene with septic shock.  Strep A sensitivities pending.  PLAN:   Check TTE  Continue Abx Post-op care per VVS  ENDOCRINE  ASSESSMENT:   DM, with hyperglycemia. PLAN:   SSI  NEUROLOGIC  ASSESSMENT:   Acute encephalopathy due to sepsis: now resolving.  Decreased motor function in the upper extremities:  Will need to consider stroke since patient states that he was normal until before admit.    PLAN:   - MRI today for possible stroke.  Clinical Summary: Mr. Aderhold is improving slowly.  Extubated yesterday.  Still on nasal cannula.  Anasarca today.  Bmet from this AM pending but will likely need diuresis so we can wean O2 [place].  Consider albumin infusion for severe hypoalbuminemia.  Consult nutrition today for supplements.   PRIBULA,CHRISTOPHER, MD Internal Medicine Resident, PGY III Chief Resident, Internal Medicine Pager: [phone] 07/03/2012 7:06 AM     I have interviewed and examined the patient and reviewed the database. I have formulated the assessment and plan as reflected in the note above with amendments made by me.    Billy Fischer, MD;  PCCM service; Mobile [phone]

## 2012-07-04 ENCOUNTER — Inpatient Hospital Stay (HOSPITAL_COMMUNITY): Payer: Medicare HMO

## 2012-07-04 DIAGNOSIS — E43 Unspecified severe protein-calorie malnutrition: Secondary | ICD-10-CM

## 2012-07-04 DIAGNOSIS — E119 Type 2 diabetes mellitus without complications: Secondary | ICD-10-CM

## 2012-07-04 LAB — GLUCOSE, CAPILLARY
Glucose-Capillary: 172 mg/dL — ABNORMAL HIGH (ref 70–99)
Glucose-Capillary: 156 mg/dL — ABNORMAL HIGH (ref 70–99)

## 2012-07-04 LAB — CLOSTRIDIUM DIFFICILE BY PCR: Toxigenic C. Difficile by PCR: NEGATIVE

## 2012-07-04 MED ORDER — GLUCERNA SHAKE PO LIQD
237.0000 mL | Freq: Two times a day (BID) | ORAL | Status: DC
  Administered 2012-07-05 – 2012-07-07 (×4): 237 mL via ORAL

## 2012-07-04 MED ORDER — SODIUM CHLORIDE 0.9 % IJ SOLN
INTRAMUSCULAR | Status: AC
  Administered 2012-07-04: 20 mL
  Filled 2012-07-04: qty 20

## 2012-07-04 MED ORDER — SACCHAROMYCES BOULARDII 250 MG PO CAPS
250.0000 mg | ORAL_CAPSULE | Freq: Two times a day (BID) | ORAL | Status: DC
  Administered 2012-07-04 – 2012-07-11 (×14): 250 mg via ORAL
  Filled 2012-07-04 (×16): qty 1

## 2012-07-04 MED ORDER — DIPHENOXYLATE-ATROPINE 2.5-0.025 MG/5ML PO LIQD
10.0000 mL | Freq: Four times a day (QID) | ORAL | Status: AC
  Administered 2012-07-04 – 2012-07-05 (×3): 10 mL via ORAL
  Filled 2012-07-04: qty 5
  Filled 2012-07-04: qty 10
  Filled 2012-07-04: qty 5

## 2012-07-04 NOTE — Progress Notes (Signed)
[facility] [place]  Subjective  - Hand tingling burning  Objective 127/62 89 97.4 F (36.3 C) (Axillary) 33 100%  Intake/Output Summary (Last 24 hours) at 07/04/12 0758 Last data filed at 07/04/12 0737  Gross per 24 hour  Intake 1342.5 ml  Output   6755 ml  Net -5412.5 ml   Left BKA healing Right groin site no hematoma Right hand pink warm but no significant movement  Assessment/Planning: Healing left BKA Viable but non functional right hand MRI head pending Continue dry dressing left BKA Agree with nutritional support for his severe protein calorie malnutrition Renal failure creatinine seems to be at plateau  Tyler Erickson,Tyler Erickson 07/04/2012 7:58 AM --  Laboratory Lab Results:  [facility] 07/03/12 0717 07/02/12 0458  WBC 15.7* 12.6*  HGB 9.4* 8.9*  HCT 26.9* 25.5*  PLT 85* PLATELET CLUMPS NOTED ON SMEAR, COUNT APPEARS DECREASED   BMET  Basename 07/03/12 1600 07/03/12 0719  NA 141 141  K 3.6 3.7  CL 107 107  CO2 23 21  GLUCOSE 154* 165*  BUN 69* 73*  CREATININE 2.12* 2.11*  CALCIUM 7.2* 6.8*    COAG Lab Results  Component Value Date   INR 1.40 06/30/2012   INR 1.41 06/29/2012   INR 1.07 10/28/2009   No results found for this basename: PTT    Antibiotics Anti-infectives     Start     Dose/Rate Route Frequency Ordered Stop   07/03/12 1600   cefTRIAXone (ROCEPHIN) 2 g in dextrose 5 % 50 mL IVPB        2 g 100 mL/hr over 30 Minutes Intravenous Every 24 hours 07/03/12 1409 07/13/12 1559   06/30/12 2000   cefUROXime (ZINACEF) 1.5 g in dextrose 5 % 50 mL IVPB  Status:  Discontinued        1.5 g 100 mL/hr over 30 Minutes Intravenous Every 12 hours 06/30/12 1954 06/30/12 2001   06/30/12 0645   piperacillin-tazobactam (ZOSYN) IVPB 3.375 g  Status:  Discontinued        3.375 g 100 mL/hr over 30 Minutes Intravenous  Once 06/30/12 0630 06/30/12 0633   06/30/12 0400   vancomycin (VANCOCIN) 500 mg in sodium chloride 0.9 % 100 mL IVPB   Status:  Discontinued        500 mg 100 mL/hr over 60 Minutes Intravenous Every 12 hours 06/29/12 1912 07/01/12 1207   06/29/12 2200   piperacillin-tazobactam (ZOSYN) IVPB 3.375 g  Status:  Discontinued        3.375 g 12.5 mL/hr over 240 Minutes Intravenous 3 times per day 06/29/12 1912 07/03/12 1409   06/29/12 1900   clindamycin (CLEOCIN) IVPB 600 mg  Status:  Discontinued        600 mg 100 mL/hr over 30 Minutes Intravenous 4 times per day 06/29/12 1847 07/03/12 1409   06/29/12 1500   vancomycin (VANCOCIN) IVPB 1000 mg/200 mL premix        1,000 mg 200 mL/hr over 60 Minutes Intravenous  Once 06/29/12 1454 06/29/12 1752   06/29/12 1500   piperacillin-tazobactam (ZOSYN) IVPB 4.5 g  Status:  Discontinued        4.5 g 200 mL/hr over 30 Minutes Intravenous  Once 06/29/12 1454 06/29/12 1459   06/29/12 1500  piperacillin-tazobactam (ZOSYN) IVPB 3.375 g       3.375 g 100 mL/hr over 30 Minutes Intravenous  Once 06/29/12 1459 06/29/12 1752

## 2012-07-04 NOTE — Progress Notes (Signed)
Diarrhea   C. Diff ordered   Place felxiseal

## 2012-07-04 NOTE — Progress Notes (Signed)
[facility] PROGRESS NOTE  Tyler Erickson MRN:[id number] DOB: 05/20/1951 DOA: 06/29/2012 PCP: MITCHELL,LEWIS DEAN, MD  Brief narrative: 61 yo male smoker admitted 06/29/2012 with chills and Lt foot pain from Lt foot gangrene and septic shock. 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  Developed rt arm pain on 1/3  LINES / TUBES:  1/03 R IJ CVL >>  1/03 ETT >> 1/5  1/03 Rt femoral Aline >>  CULTURES:  1/2 blood >> GPC in chains >> Group A strep sens pending.  1/2 urine >> negative  ANTIBIOTICS:  1/2 clindamycin >> 1/6 1/2 zosyn >> 1/6 1/3 vancomycin >> 1/3 Rocephin 1/6 >>  TESTS:  1/03 RUE arteriogram >> Normal arch anatomy, diffusely atherosclerotic right upper extremity arterial tree unreconstructable   Assessment/Plan: Principal Problem:  *Gangrene of foot Stable - followed by vascular surgery  Active Problems:  Group A strep bacteremia/ Septic shock(785.52) Total 6 wks of antibiotics - currently on Rocephin  Right hand pain Work up included CT and MRI brain which reveals multiple embolic infarcts- do not feel that they are causing his right hand pain/ numbness/ weakness- suspect brachial plexus abnormality will be consulting Neuro  Embolic infarcts Have discussed TEE with [facility]- will make NPO for AM  Diarrhea Start Lomotil- probiotics also started by ID H/o colon CA- per pts brother in law, he had diarrhea before admission Will need CT abdomen to f/u as his last follow up was 2011   DM Only takes metformin at home but sugars run in 300s-  a1c was 10.1 therefore he will need insulin on d/c   NEUROPATHY Never took medications for this- pain was in legs   AKI (acute kidney injury) Baseline cr about 1.4  Anasarca Due to hypoalbuminemia and sepsis Appears to have resolved- was given diuretics and albumin Has been diuresed an is in negative balance by 5600 cc- will d/c diuretics now and monitor   Severe protein-calorie malnutrition Pt has a great appetite- just  needs to be fed due to right hand pain Add glucerna in between meals   Code Status: full code Family Communication: sister and brother in law Disposition Plan: cont to follow in SDU DVT prophylaxis: heparin   Consultants:  vasc surgery  ID  HPI/Subjective: Pt alert, admits to pain in right hand, also states left hand/arm was weak as well but has improved. His sister is at bedside and tells me that pt does not follow up with his PCP - has not seen him in years- does not take care of himself.    Objective: Filed Vitals:   07/04/12 0650 07/04/12 0700 07/04/12 1106 07/04/12 1635  BP: 132/70 127/62 135/67   Pulse: 91 89    Temp: 98.4 F (36.9 C) 97.4 F (36.3 C) 97.6 F (36.4 C) 97.4 F (36.3 C)  TempSrc: Axillary Axillary Axillary Axillary  Resp: 33 33    Height:      Weight:      SpO2:   93%     Intake/Output Summary (Last 24 hours) at 07/04/12 1817 Last data filed at 07/04/12 1620  Gross per 24 hour  Intake    820 ml  Output  10075 ml  Net  -9255 ml    Exam:   General:  Alert, no distress  Cardiovascular: RRR, no murmurs  Respiratory:  CTA b/l   Abdomen: soft, NT, ND, BS+- rectal tube in place  Ext: left BKA- right leg no c/c/e  Neuro- right hand 2/5 with subjective pain and numbness-   left arm and right leg 5/5, did not examine left leg  Data Reviewed: Basic Metabolic Panel:  Lab 07/03/12 1600 07/03/12 0719 07/02/12 0458 07/01/12 0635 06/30/12 2201 06/29/12 1420  NA 141 141 139 140 137 --  K 3.6 3.7 3.3* 3.4* 3.2* --  CL 107 107 106 107 104 --  CO2 23 21 20 20 15* --  GLUCOSE 154* 165* 129* 214* 251* --  BUN 69* 73* 69* 61* 55* --  CREATININE 2.12* 2.11* 2.21* 1.80* 1.67* --  CALCIUM 7.2* 6.8* 6.7* 6.2* 6.4* --  MG -- -- 2.4 2.1 -- 2.6*  PHOS -- -- 2.8 1.9* -- --   Liver Function Tests:  Lab 07/03/12 0719 07/02/12 0458 07/01/12 0635 06/30/12 0609 06/29/12 1355  AST 73* 91* 154* 66* 34  ALT 31 30 46 12 10  ALKPHOS 152* 73 49 46 105  BILITOT  2.6* 2.6* 1.8* 1.2 0.8  PROT 5.9* 5.0* 4.5* 4.7* 6.0  ALBUMIN 0.9* 0.9* 1.0* 1.2* 1.9*   No results found for this basename: LIPASE:5,AMYLASE:5 in the last 168 hours No results found for this basename: AMMONIA:5 in the last 168 hours CBC:  Lab 07/03/12 0717 07/02/12 0458 07/01/12 0635 06/30/12 0609 06/29/12 1420  WBC 15.7* 12.6* 9.7 8.9 18.5*  NEUTROABS -- -- -- -- --  HGB 9.4* 8.9* 10.2* 11.8* 13.4  HCT 26.9* 25.5* 28.8* 33.6* 37.1*  MCV 83.3 80.7 79.3 82.2 80.3  PLT 85* PLATELET CLUMPS NOTED ON SMEAR, COUNT APPEARS DECREASED 113* 111* 169   Cardiac Enzymes:  Lab 06/30/12 0609  CKTOTAL --  CKMB --  CKMBINDEX --  TROPONINI <0.30   BNP (last 3 results) No results found for this basename: PROBNP:3 in the last 8760 hours CBG:  Lab 07/04/12 1633 07/04/12 1121 07/04/12 0804 07/03/12 2148 07/03/12 1636  GLUCAP 156* 171* 172* 121* 151*    Recent Results (from the past 240 hour(s))  CULTURE, BLOOD (ROUTINE X 2)     Status: Normal   Collection Time   06/29/12  2:15 PM      Component Value Range Status Comment   Specimen Description BLOOD RIGHT HAND   Final    Special Requests BOTTLES DRAWN AEROBIC AND ANAEROBIC 4CC EACH   Final    Culture  Setup Time 06/29/2012 21:41   Final    Culture     Final    Value: GROUP A STREP (S.PYOGENES) ISOLATED     Note: CRITICAL RESULT CALLED TO, READ BACK BY AND VERIFIED WITH: CHRIS ARMITAGE 07/01/12 @ 10:45AM BY RUSCA.     Note: Gram Stain Report Called to,Read Back By and Verified With: TERESA O LEARY 06/30/12 1040 BY SMITHERSJ   Report Status 07/01/2012 FINAL   Final   CULTURE, BLOOD (ROUTINE X 2)     Status: Normal   Collection Time   06/29/12  2:20 PM      Component Value Range Status Comment   Specimen Description BLOOD RIGHT THUMB   Final    Special Requests BOTTLES DRAWN AEROBIC ONLY 3CC   Final    Culture  Setup Time 06/29/2012 21:40   Final    Culture     Final    Value: GROUP A STREP (S.PYOGENES) ISOLATED     Note: CRITICAL RESULT CALLED  TO, READ BACK BY AND VERIFIED WITH: CHRIS ARMITAGE 07/01/12 @ 10:45AM BY RUSCA.     Note: PAM ALVORD 06/30/12 1135 BY SMITHERSJ   Report Status 07/01/2012 FINAL   Final   URINE CULTURE       Status: Normal   Collection Time   06/29/12  4:01 PM      Component Value Range Status Comment   Specimen Description URINE, CLEAN CATCH   Final    Special Requests NONE   Final    Culture  Setup Time 06/30/2012 02:24   Final    Colony Count NO GROWTH   Final    Culture NO GROWTH   Final    Report Status 07/01/2012 FINAL   Final   MRSA PCR SCREENING     Status: Normal   Collection Time   06/29/12  8:56 PM      Component Value Range Status Comment   MRSA by PCR NEGATIVE  NEGATIVE Final   SURGICAL PCR SCREEN     Status: Normal   Collection Time   06/30/12 10:08 AM      Component Value Range Status Comment   MRSA, PCR NEGATIVE  NEGATIVE Final    Staphylococcus aureus NEGATIVE  NEGATIVE Final   CLOSTRIDIUM DIFFICILE BY PCR     Status: Normal   Collection Time   07/04/12  4:00 AM      Component Value Range Status Comment   C difficile by pcr NEGATIVE  NEGATIVE Final      Studies: Dg Chest 2 View  06/29/2012  *RADIOLOGY REPORT*  Clinical Data: 61-year-old male with weakness and swelling.  CHEST - 2 VIEW  Comparison: 10/28/2009 and prior chest radiographs  Findings: The cardiomediastinal silhouette is unremarkable. Mild peribronchial thickening is stable. There is no evidence of focal airspace disease, pulmonary edema, suspicious pulmonary nodule/mass, pleural effusion, or pneumothorax. No acute bony abnormalities are identified.  IMPRESSION: No evidence of active cardiopulmonary disease.   Original Report Authenticated By: Jeffrey Hu, M.D.    Dg Shoulder Right  06/29/2012  *RADIOLOGY REPORT*  Clinical Data: 61-year-old male with right shoulder pain and swelling.  RIGHT SHOULDER - 2+ VIEW  Comparison: 10/28/2009 chest radiograph  Findings: There is no evidence of acute bony abnormality. There is no evidence of acute  fracture, subluxation, or dislocation. No focal bony lesions are identified. The visualized right hemithorax is unremarkable.  Moderate degenerative changes at the AC joint noted. Decreased acromiohumeral space may represent a chronic rotator cuff tear.  IMPRESSION:  No evidence of acute bony abnormality.  Moderate AC joint degenerative changes and possible chronic rotator cuff tear.   Original Report Authenticated By: Jeffrey Hu, M.D.    Dg Forearm Right  06/29/2012  *RADIOLOGY REPORT*  Clinical Data: Right forearm pain and swelling.  RIGHT FOREARM - 2 VIEW  Comparison: None  Findings: No evidence of acute fracture, subluxation or dislocation identified.  No radio-opaque foreign bodies are present.  No focal bony lesions are noted.  The joint spaces are unremarkable.  Vascular calcifications are noted.  IMPRESSION: No evidence of acute bony abnormality.   Original Report Authenticated By: Jeffrey Hu, M.D.    Ct Head Wo Contrast  07/01/2012  *RADIOLOGY REPORT*  Clinical Data: Right arm weakness.  Rule out stroke.  CT HEAD WITHOUT CONTRAST  Technique:  Contiguous axial images were obtained from the base of the skull through the vertex without contrast.  Comparison: None.  Findings: Mild atrophy.  Negative for acute infarct.  Negative for hemorrhage or mass lesion.  No fluid collection is present. Calvarium is intact.  IMPRESSION: Mild atrophy.  No acute abnormality.   Original Report Authenticated By: David Clark, M.D.    Mr Mra Head Wo Contrast  07/04/2012  *RADIOLOGY REPORT*    Clinical Data:  61-year-old male with slurred speech, difficulty moving right shoulder.  Recent left lower extremity amputation.  Comparison:  Head CT 07/01/2012.  MRI HEAD WITHOUT CONTRAST MRI CERVICAL SPINE WITHOUT CONTRAST  Technique:  Multiplanar, multiecho pulse sequences of the brain and surrounding structures, and cervical spine, to include the craniocervical junction and cervicothoracic junction, were obtained without intravenous  contrast.  MRI HEAD  Findings:  Punctate cortical focus of restricted diffusion in the left parietal lobe, posterior to the sensory strip (series 5 image 23).  No associated T2 or FLAIR hyperintensity.  Possible additional punctate area of restriction in the left occipital lobe white matter (image 13).  There is a small focus of restricted diffusion along the anterior right cerebellar tonsil (series 5 image 5).  There are to other punctate areas of restricted diffusion in the right cerebellum, one involving the right middle cerebellar peduncle (series 5 image 8). Probably also one additional punctate area of restriction in the dorsal medulla also on this image.  Minimal associated T2 and FLAIR hyperintensity.  Diffusion elsewhere is within normal limits. No acute intracranial hemorrhage identified.  No midline shift, mass effect, or evidence of mass lesion.  No ventriculomegaly. Major intracranial vascular flow voids are preserved, MRA findings are below.  Outside of the above findings, there is only mild for age scattered cerebral white matter T2 and FLAIR hyperintensity, mostly subcortical.  Negative pituitary cervicomedullary junction. Cervical spine findings are below.  Visualized orbit soft tissues are within normal limits.  Trace mastoid effusions.  Negative visualized nasopharynx. Mild maxillary and ethmoid sinus mucosal thickening.  Negative scalp soft tissues.  IMPRESSION: 1. 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  Two punctate acute infarcts suspected also in the left occipital and parietal lobe. No mass effect or hemorrhage. Given the distribution, embolic phenomena to the posterior circulation is possible.  The supratentorial findings might also reflect synchronous small vessel disease. 2.  No other acute intracranial abnormality and otherwise largely unremarkable for age MRI appearance of the brain.  3.  Cervical spine and MRA findings are below.  MRI CERVICAL SPINE   Findings: Preserved cervical lordosis. No marrow edema or evidence of acute osseous abnormality.  Subcutaneous edema in the posterior paraspinal soft tissues, possible mild involvement of the superficial aspect of the underlying erector spinae muscles.  This may be related to prolonged bedrest, the conditioning. Other Visualized paraspinal soft tissues are within normal limits.  Cervicomedullary junction is within normal limits.  Despite spinal stenosis, described below, no cervical spinal cord signal abnormality.  C2-C3:  Negative.  C3-C4:  Mild circumferential disc bulge.  Mild uncovertebral hypertrophy.  Mild bilateral C4 foraminal stenosis.  C4-C5:  Circumferential disc osteophyte complex.  Broad-based central right paracentral posterior component of disc.  Spinal stenosis with mild spinal cord flattening.  Mild left C5 foraminal stenosis.  C5-C6:  Circumferential disc osteophyte complex.  Moderate ligament flavum hypertrophy.  Broad-based posterior component of disc. Spinal stenosis with mild spinal cord flattening.  Mild to moderate bilateral C6 foraminal stenosis.  C6-C7:  Circumferential disc osteophyte complex.  Broad-based left eccentric posterior component of disc.  Moderate ligament flavum hypertrophy.  Spinal stenosis with minimal spinal cord mass effect. Moderate to severe left and mild right C7 foraminal stenosis.  C7-T1:  Mild facet hypertrophy.  Mild left greater than right C8 foraminal stenosis.  Negative visualized upper thoracic levels.  IMPRESSION: 1.  Multifactorial cervical spinal stenosis with mild spinal cord mass effect from C4-C5   to C6-C7.  No spinal cord signal abnormality. 2.  Moderate to severe left C7 foraminal stenosis related to disc and endplate changes. Mild to moderate multifactorial bilateral C6 foraminal stenosis. 3.  MRA findings are below.  MRA HEAD WITHOUT CONTRAST  Technique:  Angiographic images of the Circle of Willis were obtained using MRA technique without intravenous  contrast.  Findings:  Antegrade flow in the posterior circulation.  Codominant distal vertebral arteries.  Normal right PICA.  Normal vertebrobasilar junction.  Prominent right AICA also noted.  No left PICA or AICA origin identified. Subsequently, the left SCA might be dominant.  No basilar stenosis.  SCA and PCA origins are normal.  Diminutive posterior communicating arteries.  Bilateral PCA branches are within normal limits.  Antegrade flow in both ICA siphons.  Mild supraclinoid ICA irregularity.  No focal ICA stenosis.  Patent carotid termini.  MCA and ACA origins are within normal limits.  Diminutive or absent anterior communicating artery.  Visualized ACA branches are within normal limits.  Visualized bilateral MCA branches are within normal limits.  IMPRESSION: Negative intracranial MRA.   Original Report Authenticated By: H. Hall III, M.D.    Mr Brain Wo Contrast  07/04/2012  *RADIOLOGY REPORT*  Clinical Data:  61-year-old male with slurred speech, difficulty moving right shoulder.  Recent left lower extremity amputation.  Comparison:  Head CT 07/01/2012.  MRI HEAD WITHOUT CONTRAST MRI CERVICAL SPINE WITHOUT CONTRAST  Technique:  Multiplanar, multiecho pulse sequences of the brain and surrounding structures, and cervical spine, to include the craniocervical junction and cervicothoracic junction, were obtained without intravenous contrast.  MRI HEAD  Findings:  Punctate cortical focus of restricted diffusion in the left parietal lobe, posterior to the sensory strip (series 5 image 23).  No associated T2 or FLAIR hyperintensity.  Possible additional punctate area of restriction in the left occipital lobe white matter (image 13).  There is a small focus of restricted diffusion along the anterior right cerebellar tonsil (series 5 image 5).  There are to other punctate areas of restricted diffusion in the right cerebellum, one involving the right middle cerebellar peduncle (series 5 image 8). Probably also one  additional punctate area of restriction in the dorsal medulla also on this image.  Minimal associated T2 and FLAIR hyperintensity.  Diffusion elsewhere is within normal limits. No acute intracranial hemorrhage identified.  No midline shift, mass effect, or evidence of mass lesion.  No ventriculomegaly. Major intracranial vascular flow voids are preserved, MRA findings are below.  Outside of the above findings, there is only mild for age scattered cerebral white matter T2 and FLAIR hyperintensity, mostly subcortical.  Negative pituitary cervicomedullary junction. Cervical spine findings are below.  Visualized orbit soft tissues are within normal limits.  Trace mastoid effusions.  Negative visualized nasopharynx. Mild maxillary and ethmoid sinus mucosal thickening.  Negative scalp soft tissues.  IMPRESSION: 1. 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  Two punctate acute infarcts suspected also in the left occipital and parietal lobe. No mass effect or hemorrhage. Given the distribution, embolic phenomena to the posterior circulation is possible.  The supratentorial findings might also reflect synchronous small vessel disease. 2.  No other acute intracranial abnormality and otherwise largely unremarkable for age MRI appearance of the brain.  3.  Cervical spine and MRA findings are below.  MRI CERVICAL SPINE  Findings: Preserved cervical lordosis. No marrow edema or evidence of acute osseous abnormality.  Subcutaneous edema in the posterior paraspinal soft tissues, possible mild   involvement of the superficial aspect of the underlying erector spinae muscles.  This may be related to prolonged bedrest, the conditioning. Other Visualized paraspinal soft tissues are within normal limits.  Cervicomedullary junction is within normal limits.  Despite spinal stenosis, described below, no cervical spinal cord signal abnormality.  C2-C3:  Negative.  C3-C4:  Mild circumferential disc bulge.  Mild  uncovertebral hypertrophy.  Mild bilateral C4 foraminal stenosis.  C4-C5:  Circumferential disc osteophyte complex.  Broad-based central right paracentral posterior component of disc.  Spinal stenosis with mild spinal cord flattening.  Mild left C5 foraminal stenosis.  C5-C6:  Circumferential disc osteophyte complex.  Moderate ligament flavum hypertrophy.  Broad-based posterior component of disc. Spinal stenosis with mild spinal cord flattening.  Mild to moderate bilateral C6 foraminal stenosis.  C6-C7:  Circumferential disc osteophyte complex.  Broad-based left eccentric posterior component of disc.  Moderate ligament flavum hypertrophy.  Spinal stenosis with minimal spinal cord mass effect. Moderate to severe left and mild right C7 foraminal stenosis.  C7-T1:  Mild facet hypertrophy.  Mild left greater than right C8 foraminal stenosis.  Negative visualized upper thoracic levels.  IMPRESSION: 1.  Multifactorial cervical spinal stenosis with mild spinal cord mass effect from C4-C5 to C6-C7.  No spinal cord signal abnormality. 2.  Moderate to severe left C7 foraminal stenosis related to disc and endplate changes. Mild to moderate multifactorial bilateral C6 foraminal stenosis. 3.  MRA findings are below.  MRA HEAD WITHOUT CONTRAST  Technique:  Angiographic images of the Circle of Willis were obtained using MRA technique without intravenous contrast.  Findings:  Antegrade flow in the posterior circulation.  Codominant distal vertebral arteries.  Normal right PICA.  Normal vertebrobasilar junction.  Prominent right AICA also noted.  No left PICA or AICA origin identified. Subsequently, the left SCA might be dominant.  No basilar stenosis.  SCA and PCA origins are normal.  Diminutive posterior communicating arteries.  Bilateral PCA branches are within normal limits.  Antegrade flow in both ICA siphons.  Mild supraclinoid ICA irregularity.  No focal ICA stenosis.  Patent carotid termini.  MCA and ACA origins are within  normal limits.  Diminutive or absent anterior communicating artery.  Visualized ACA branches are within normal limits.  Visualized bilateral MCA branches are within normal limits.  IMPRESSION: Negative intracranial MRA.   Original Report Authenticated By: H. Hall III, M.D.    Mr Cervical Spine Wo Contrast  07/04/2012  *RADIOLOGY REPORT*  Clinical Data:  61-year-old male with slurred speech, difficulty moving right shoulder.  Recent left lower extremity amputation.  Comparison:  Head CT 07/01/2012.  MRI HEAD WITHOUT CONTRAST MRI CERVICAL SPINE WITHOUT CONTRAST  Technique:  Multiplanar, multiecho pulse sequences of the brain and surrounding structures, and cervical spine, to include the craniocervical junction and cervicothoracic junction, were obtained without intravenous contrast.  MRI HEAD  Findings:  Punctate cortical focus of restricted diffusion in the left parietal lobe, posterior to the sensory strip (series 5 image 23).  No associated T2 or FLAIR hyperintensity.  Possible additional punctate area of restriction in the left occipital lobe white matter (image 13).  There is a small focus of restricted diffusion along the anterior right cerebellar tonsil (series 5 image 5).  There are to other punctate areas of restricted diffusion in the right cerebellum, one involving the right middle cerebellar peduncle (series 5 image 8). Probably also one additional punctate area of restriction in the dorsal medulla also on this image.  Minimal associated T2 and FLAIR hyperintensity.    Diffusion elsewhere is within normal limits. No acute intracranial hemorrhage identified.  No midline shift, mass effect, or evidence of mass lesion.  No ventriculomegaly. Major intracranial vascular flow voids are preserved, MRA findings are below.  Outside of the above findings, there is only mild for age scattered cerebral white matter T2 and FLAIR hyperintensity, mostly subcortical.  Negative pituitary cervicomedullary junction. Cervical  spine findings are below.  Visualized orbit soft tissues are within normal limits.  Trace mastoid effusions.  Negative visualized nasopharynx. Mild maxillary and ethmoid sinus mucosal thickening.  Negative scalp soft tissues.  IMPRESSION: 1. 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  Two punctate acute infarcts suspected also in the left occipital and parietal lobe. No mass effect or hemorrhage. Given the distribution, embolic phenomena to the posterior circulation is possible.  The supratentorial findings might also reflect synchronous small vessel disease. 2.  No other acute intracranial abnormality and otherwise largely unremarkable for age MRI appearance of the brain.  3.  Cervical spine and MRA findings are below.  MRI CERVICAL SPINE  Findings: Preserved cervical lordosis. No marrow edema or evidence of acute osseous abnormality.  Subcutaneous edema in the posterior paraspinal soft tissues, possible mild involvement of the superficial aspect of the underlying erector spinae muscles.  This may be related to prolonged bedrest, the conditioning. Other Visualized paraspinal soft tissues are within normal limits.  Cervicomedullary junction is within normal limits.  Despite spinal stenosis, described below, no cervical spinal cord signal abnormality.  C2-C3:  Negative.  C3-C4:  Mild circumferential disc bulge.  Mild uncovertebral hypertrophy.  Mild bilateral C4 foraminal stenosis.  C4-C5:  Circumferential disc osteophyte complex.  Broad-based central right paracentral posterior component of disc.  Spinal stenosis with mild spinal cord flattening.  Mild left C5 foraminal stenosis.  C5-C6:  Circumferential disc osteophyte complex.  Moderate ligament flavum hypertrophy.  Broad-based posterior component of disc. Spinal stenosis with mild spinal cord flattening.  Mild to moderate bilateral C6 foraminal stenosis.  C6-C7:  Circumferential disc osteophyte complex.  Broad-based left eccentric  posterior component of disc.  Moderate ligament flavum hypertrophy.  Spinal stenosis with minimal spinal cord mass effect. Moderate to severe left and mild right C7 foraminal stenosis.  C7-T1:  Mild facet hypertrophy.  Mild left greater than right C8 foraminal stenosis.  Negative visualized upper thoracic levels.  IMPRESSION: 1.  Multifactorial cervical spinal stenosis with mild spinal cord mass effect from C4-C5 to C6-C7.  No spinal cord signal abnormality. 2.  Moderate to severe left C7 foraminal stenosis related to disc and endplate changes. Mild to moderate multifactorial bilateral C6 foraminal stenosis. 3.  MRA findings are below.  MRA HEAD WITHOUT CONTRAST  Technique:  Angiographic images of the Circle of Willis were obtained using MRA technique without intravenous contrast.  Findings:  Antegrade flow in the posterior circulation.  Codominant distal vertebral arteries.  Normal right PICA.  Normal vertebrobasilar junction.  Prominent right AICA also noted.  No left PICA or AICA origin identified. Subsequently, the left SCA might be dominant.  No basilar stenosis.  SCA and PCA origins are normal.  Diminutive posterior communicating arteries.  Bilateral PCA branches are within normal limits.  Antegrade flow in both ICA siphons.  Mild supraclinoid ICA irregularity.  No focal ICA stenosis.  Patent carotid termini.  MCA and ACA origins are within normal limits.  Diminutive or absent anterior communicating artery.  Visualized ACA branches are within normal limits.  Visualized bilateral MCA branches are within normal   limits.  IMPRESSION: Negative intracranial MRA.   Original Report Authenticated By: H. Hall III, M.D.    Dg Chest Port 1 View  07/02/2012  *RADIOLOGY REPORT*  Clinical Data: Advancement of endotracheal tube  PORTABLE CHEST - 1 VIEW  Comparison: Portable exam 0626 hours compared to 07/02/2012 at 0514 hours  Findings: Tip of endotracheal tube now 2.0 cm above carina. Nasogastric tube and right jugular  line unchanged. Stable heart size. Persistent atelectasis versus consolidation left lower lobe. Mild right basilar atelectasis. Upper lungs clear.  IMPRESSION: Tip of endotracheal tube now 2.0 cm above carina. Remainder exam unchanged.   Original Report Authenticated By: Mark Boles, M.D.    Dg Chest Port 1 View  07/02/2012  *RADIOLOGY REPORT*  Clinical Data: Follow up atelectasis  PORTABLE CHEST - 1 VIEW  Comparison: Portable exam 0514 hours compared to 07/01/2012  Findings: Tip of endotracheal tube in cervical region, 11.8 cm above carina. Right jugular central venous catheter tip projecting over SVC. Nasogastric tube extends into stomach. Numerous cardiac monitoring leads project over chest. Upper-normal size of cardiac silhouette. Mediastinal contours and pulmonary vascularity normal. Minimal right basilar atelectasis. Slightly increased atelectasis versus consolidation in medial left lower lobe. No definite pleural effusion or pneumothorax.  IMPRESSION: Minimal right basilar atelectasis with increased atelectasis versus consolidation in left lower lobe. High position of endotracheal tube 11.8 cm above carina; may consider advancing tube 6 cm for more stable position within thoracic trachea.   Original Report Authenticated By: Mark Boles, M.D.    Dg Chest Port 1 View  07/01/2012  *RADIOLOGY REPORT*  Clinical Data: Acute respiratory failure on ventilator.  Colon carcinoma.  PORTABLE CHEST - 1 VIEW  Comparison: 06/30/2012  Findings: Endotracheal tube remains high in position with the tip approximately 11 cm above the carina.  Both lungs are clear.  Heart size is normal.  Right jugular center venous catheter remains in appropriate position.  IMPRESSION:  1.  High endotracheal tube position, with tip approximately 11 cm above carina. 2.  No active lung disease.   Original Report Authenticated By: John Stahl, M.D.    Dg Chest Port 1 View  06/30/2012  *RADIOLOGY REPORT*  Clinical Data: Endotracheal placement   PORTABLE CHEST - 1 VIEW  Comparison: Same day  Findings: Endotracheal tip is 9 cm above the carina, at the thoracic inlet.  Right internal jugular central line is in the SVC at the azygos level.  Minimal atelectasis again noted in the left lower lobe.  No other change.  IMPRESSION: Endotracheal tube somewhat high, 9 cm above the carina at the thoracic inlet.   Original Report Authenticated By: Mark Shogry, M.D.    Dg Chest Portable 1 View  06/30/2012  *RADIOLOGY REPORT*  Clinical Data: Central line placement.  PORTABLE CHEST - 1 VIEW  Comparison: Chest radiograph performed 06/29/2012  Findings: The lungs are well-aerated.  Mild right basilar opacity may reflect atelectasis or possibly pneumonia.  There is no evidence of pleural effusion or pneumothorax.  The cardiomediastinal silhouette is within normal limits.  No acute osseous abnormalities are seen.  A right IJ line is noted ending about the mid SVC.  IMPRESSION:  1.  Right IJ line noted ending about the mid SVC. 2.  Mild right basilar airspace opacity may reflect atelectasis or possibly pneumonia.   Original Report Authenticated By: Jeffrey Chang, M.D.    Dg Foot Complete Left  06/29/2012  *RADIOLOGY REPORT*  Clinical Data: 61-year-old male with left foot pain and wounds on left   foot.  LEFT FOOT - COMPLETE 3+ VIEW  Comparison: None  Findings: Soft tissue gas and swelling is identified at the level of the lateral MTP joints. There is no evidence of acute fracture, subluxation or dislocation. The Lisfranc joints are intact. No focal bony lesions are present except for a large plantar calcaneal spur. Vascular calcifications are present.  IMPRESSION: Soft tissue gas and swelling overlying the distal foot suspicious either for open wound or gas forming infection.  No radiographic evidence of osteomyelitis.  No acute bony abnormalities identified.  Calcaneal spur and vascular calcifications.   Original Report Authenticated By: Jeffrey Hu, M.D.     Scheduled  Meds:   . antiseptic oral rinse  15 mL Mouth Rinse BID  . cefTRIAXone (ROCEPHIN)  IV  2 g Intravenous Q24H  . diphenoxylate-atropine  10 mL Oral QID  . feeding supplement  237 mL Oral BID BM  . feeding supplement  30 mL Oral TID WC  . furosemide  80 mg Intravenous Q8H  . heparin subcutaneous  5,000 Units Subcutaneous Q8H  . insulin aspart  0-15 Units Subcutaneous TID WC  . insulin glargine  10 Units Subcutaneous Daily  . saccharomyces boulardii  250 mg Oral BID  . sodium chloride  3 mL Intravenous Q12H   Continuous Infusions:   . sodium chloride Stopped (07/01/12 1600)    ________________________________________________________________________  Time spent: 40 min    Harvy Riera  [facility] Pager [phone] If 8PM-8AM, please contact night-coverage at [email or web address], password [email or web address] 07/04/2012, 6:17 PM  LOS: 5 days     

## 2012-07-04 NOTE — Progress Notes (Signed)
Orthopedic Tech Progress Note Patient Details:  Tyler Erickson 10-02-1950 [id number] Brace completed by [employer] vendor Tyler Erickson. Patient ID: Tyler Erickson, male   DOB: Dec 04, 1950, 62 y.o.   MRN: [id number]   Tyler Erickson 07/04/2012, 7:21 PM

## 2012-07-04 NOTE — Consult Note (Signed)
Reason for Consult:strokes  CC: b/l strokes  HPI: 62 y.o. male with a PMH of DM, diabetic neuropathy, stage III colon cancer status post subtotal colectomy and chemotherapy (11 cycles of FOLFOX) who presents to the ER with generalized weakness and left foot pain. He has not seen a doctor in over 2 years due to financial issues. The patient states his LLE has been discolored and tender for about a week. No fever, but reports subjective chills. He has not checked his glucoses in 2 years. Pt found to have L foot gangrene and sepsis with pos. Blood cx.  S/P L BKA, s/p extubation. 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 Pt s/p MRI found to have multiple b/l watershed infarcts.    Past Medical History  Diagnosis Date  . ADENOCARCINOMA, COLON     S/P surgery/chemotherapy  . DM   . NEUROPATHY   . Kidney stones   . Large bowel obstruction   . Ischemic colon     Past Surgical History  Procedure Date  . 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 Ileostomy closure 10/31/2009  . Porta-cath insertion 08/08/2008  . Amputation 06/30/2012    Procedure: AMPUTATION BELOW KNEE;  Surgeon: Sherren Kerns, MD;  Location: [facility] OR;  Service: Vascular;  Laterality: Left;  . Intraoperative arteriogram 06/30/2012    Procedure: INTRA OPERATIVE ARTERIOGRAM;  Surgeon: Sherren Kerns, MD;  Location: [facility] OR;  Service: Vascular;  Laterality: Right;  . Aortogram 06/30/2012    Procedure: AORTOGRAM;  Surgeon: Sherren Kerns, MD;  Location: [facility] OR;  Service: Vascular;  Laterality: Right;  Right upper extremity arch     Family History  Problem Relation Age of Onset  . Diabetes Mother   . Diabetes Father   . Heart failure Mother   . Heart failure Father   . Stroke Father   . Diabetes Sister   . Diabetes Sister   . Diabetes Brother     Social History:  reports that he has been smoking Cigars.  He has never used smokeless tobacco. He reports that he does not  drink alcohol or use illicit drugs.  No Known Allergies  Medications: I have reviewed the patient's current medications.  ROS: History obtained from chart review  Psychological ROS: negative for - behavioral disorder, hallucinations, memory difficulties, mood swings or suicidal ideation Ophthalmic ROS: negative for - blurry vision, double vision, eye pain or loss of vision ENT ROS: negative for - epistaxis, nasal discharge, oral lesions, sore throat, tinnitus or vertigo Allergy and Immunology ROS: negative for - hives or itchy/watery eyes Hematological and Lymphatic ROS: negative for - bleeding problems, bruising or swollen lymph nodes Endocrine ROS: L BKA cardiovascular ROS: negative for - chest pain, dyspnea on exertion, edema or irregular heartbeat Gastrointestinal ROS: negative for - abdominal pain, diarrhea, hematemesis, nausea/vomiting or stool incontinence Genito-Urinary ROS: negative for - dysuria, hematuria, incontinence or urinary frequency/urgency Neurological ROS: as noted in HPI Dermatological ROS: negative for rash and skin lesion changes   Physical Examination: Blood pressure 116/59, pulse 91, temperature 97.4 F (36.3 C), temperature source Axillary, resp. rate 16, height 5\' 8"  (1.727 m), weight 79.7 kg (175 lb 11.3 oz), SpO2 97.00%.  Neurologic Examination Mental Status: Alert, oriented Cranial Nerves: II: Discs flat bilaterally; Visual fields grossly normal, pupils equal, round, reactive to light and accommodation III,IV, VI: ptosis not present, extra-ocular motions intact bilaterally V,VII: smile symmetric, facial light touch sensation normal bilaterally VIII: hearing normal bilaterally  IX,X: gag reflex present XI: bilateral shoulder shrug XII: midline tongue extension Motor: Right : Upper extremity   3/5    Left:     Upper extremity   4+/5  Lower extremity   4+/5     Lower extremity   BKA Tone and bulk:normal tone throughout; no atrophy noted Sensory:  Pinprick and light touch intact throughout, bilaterally Deep Tendon Reflexes: 1+ and symmetric throughout Plantars: Right: downgoing   Left: downgoing Cerebellar: normal finger-to-nose      Laboratory Studies:   Basic Metabolic Panel:  Lab 07/03/12 1914 07/03/12 0719 07/02/12 0458 07/01/12 0635 06/30/12 2201 06/29/12 1420  NA 141 141 139 140 137 --  K 3.6 3.7 3.3* 3.4* 3.2* --  CL 107 107 106 107 104 --  CO2 23 21 20 20  15* --  GLUCOSE 154* 165* 129* 214* 251* --  BUN 69* 73* 69* 61* 55* --  CREATININE 2.12* 2.11* 2.21* 1.80* 1.67* --  CALCIUM 7.2* 6.8* 6.7* -- -- --  MG -- -- 2.4 2.1 -- 2.6*  PHOS -- -- 2.8 1.9* -- --    Liver Function Tests:  Lab 07/03/12 0719 07/02/12 0458 07/01/12 0635 06/30/12 0609 06/29/12 1355  AST 73* 91* 154* 66* 34  ALT 31 30 46 12 10  ALKPHOS 152* 73 49 46 105  BILITOT 2.6* 2.6* 1.8* 1.2 0.8  PROT 5.9* 5.0* 4.5* 4.7* 6.0  ALBUMIN 0.9* 0.9* 1.0* 1.2* 1.9*   No results found for this basename: LIPASE:5,AMYLASE:5 in the last 168 hours No results found for this basename: AMMONIA:3 in the last 168 hours  CBC:  Lab 07/03/12 0717 07/02/12 0458 07/01/12 0635 06/30/12 0609 06/29/12 1420  WBC 15.7* 12.6* 9.7 8.9 18.5*  NEUTROABS -- -- -- -- --  HGB 9.4* 8.9* 10.2* 11.8* 13.4  HCT 26.9* 25.5* 28.8* 33.6* 37.1*  MCV 83.3 80.7 79.3 82.2 80.3  PLT 85* PLATELET CLUMPS NOTED ON SMEAR, COUNT APPEARS DECREASED 113* 111* 169    Cardiac Enzymes:  Lab 06/30/12 0609  CKTOTAL --  CKMB --  CKMBINDEX --  TROPONINI <0.30    BNP: No components found with this basename: POCBNP:5  CBG:  Lab 07/04/12 1633 07/04/12 1121 07/04/12 0804 07/03/12 2148 07/03/12 1636  GLUCAP 156* 171* 172* 121* 151*    Microbiology: Results for orders placed during the hospital encounter of 06/29/12  CULTURE, BLOOD (ROUTINE X 2)     Status: Normal   Collection Time   06/29/12  2:15 PM      Component Value Range Status Comment   Specimen Description BLOOD RIGHT HAND    Final    Special Requests BOTTLES DRAWN AEROBIC AND ANAEROBIC 4CC EACH   Final    Culture  Setup Time 06/29/2012 21:41   Final    Culture     Final    Value: GROUP A STREP (S.PYOGENES) ISOLATED     Note: CRITICAL RESULT CALLED TO, READ BACK BY AND VERIFIED WITH: CHRIS ARMITAGE 07/01/12 @ 10:45AM BY RUSCA.     Note: Gram Stain Report Called to,Read Back By and Verified With: TERESA Antony Salmon 06/30/12 1040 BY SMITHERSJ   Report Status 07/01/2012 FINAL   Final   CULTURE, BLOOD (ROUTINE X 2)     Status: Normal   Collection Time   06/29/12  2:20 PM      Component Value Range Status Comment   Specimen Description BLOOD RIGHT THUMB   Final    Special Requests BOTTLES DRAWN AEROBIC ONLY 3CC  Final    Culture  Setup Time 06/29/2012 21:40   Final    Culture     Final    Value: GROUP A STREP (S.PYOGENES) ISOLATED     Note: CRITICAL RESULT CALLED TO, READ BACK BY AND VERIFIED WITH: CHRIS ARMITAGE 07/01/12 @ 10:45AM BY RUSCA.     Note: PAM ALVORD 06/30/12 1135 BY SMITHERSJ   Report Status 07/01/2012 FINAL   Final   URINE CULTURE     Status: Normal   Collection Time   06/29/12  4:01 PM      Component Value Range Status Comment   Specimen Description URINE, CLEAN CATCH   Final    Special Requests NONE   Final    Culture  Setup Time 06/30/2012 02:24   Final    Colony Count NO GROWTH   Final    Culture NO GROWTH   Final    Report Status 07/01/2012 FINAL   Final   MRSA PCR SCREENING     Status: Normal   Collection Time   06/29/12  8:56 PM      Component Value Range Status Comment   MRSA by PCR NEGATIVE  NEGATIVE Final   SURGICAL PCR SCREEN     Status: Normal   Collection Time   06/30/12 10:08 AM      Component Value Range Status Comment   MRSA, PCR NEGATIVE  NEGATIVE Final    Staphylococcus aureus NEGATIVE  NEGATIVE Final   CLOSTRIDIUM DIFFICILE BY PCR     Status: Normal   Collection Time   07/04/12  4:00 AM      Component Value Range Status Comment   C difficile by pcr NEGATIVE  NEGATIVE Final      Coagulation Studies: No results found for this basename: LABPROT:5,INR:5 in the last 72 hours  Urinalysis:  Lab 06/30/12 0928 06/29/12 1601  COLORURINE AMBER* AMBER*  LABSPEC 1.027 1.026  PHURINE 5.0 5.0  GLUCOSEU 100* >1000*  HGBUR LARGE* LARGE*  BILIRUBINUR SMALL* SMALL*  KETONESUR 15* TRACE*  PROTEINUR 100* 100*  UROBILINOGEN 1.0 1.0  NITRITE NEGATIVE NEGATIVE  LEUKOCYTESUR NEGATIVE NEGATIVE    Lipid Panel:  No results found for this basename: chol, trig, hdl, cholhdl, vldl, ldlcalc    HgbA1C:  Lab Results  Component Value Date   HGBA1C 10.1* 06/29/2012    Urine Drug Screen:   No results found for this basename: labopia, cocainscrnur, labbenz, amphetmu, thcu, labbarb    Alcohol Level: No results found for this basename: ETH:2 in the last 168 hours  Other results:  Imaging: Mr Shirlee Latch Wo Contrast  07/04/2012  *RADIOLOGY REPORT*  Clinical Data:  62 year old male with slurred speech, difficulty moving right shoulder.  Recent left lower extremity amputation.  Comparison:  Head CT 07/01/2012.  MRI HEAD WITHOUT CONTRAST MRI CERVICAL SPINE WITHOUT CONTRAST  Technique:  Multiplanar, multiecho pulse sequences of the brain and surrounding structures, and cervical spine, to include the craniocervical junction and cervicothoracic junction, were obtained without intravenous contrast.  MRI HEAD  Findings:  Punctate cortical focus of restricted diffusion in the left parietal lobe, posterior to the sensory strip (series 5 image 23).  No associated T2 or FLAIR hyperintensity.  Possible additional punctate area of restriction in the left occipital lobe white matter (image 13).  There is a small focus of restricted diffusion along the anterior right cerebellar tonsil (series 5 image 5).  There are to other punctate areas of restricted diffusion in the right cerebellum, one involving the right middle  cerebellar peduncle (series 5 image 8). Probably also one additional punctate area of  restriction in the dorsal medulla also on this image.  Minimal associated T2 and FLAIR hyperintensity.  Diffusion elsewhere is within normal limits. No acute intracranial hemorrhage identified.  No midline shift, mass effect, or evidence of mass lesion.  No ventriculomegaly. Major intracranial vascular flow voids are preserved, MRA findings are below.  Outside of the above findings, there is only mild for age scattered cerebral white matter T2 and FLAIR hyperintensity, mostly subcortical.  Negative pituitary cervicomedullary junction. Cervical spine findings are below.  Visualized orbit soft tissues are within normal limits.  Trace mastoid effusions.  Negative visualized nasopharynx. Mild maxillary and ethmoid sinus mucosal thickening.  Negative scalp soft tissues.  IMPRESSION: 1. 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  Two punctate acute infarcts suspected also in the left occipital and parietal lobe. No mass effect or hemorrhage. Given the distribution, embolic phenomena to the posterior circulation is possible.  The supratentorial findings might also reflect synchronous small vessel disease. 2.  No other acute intracranial abnormality and otherwise largely unremarkable for age MRI appearance of the brain.  3.  Cervical spine and MRA findings are below.  MRI CERVICAL SPINE  Findings: Preserved cervical lordosis. No marrow edema or evidence of acute osseous abnormality.  Subcutaneous edema in the posterior paraspinal soft tissues, possible mild involvement of the superficial aspect of the underlying erector spinae muscles.  This may be related to prolonged bedrest, the conditioning. Other Visualized paraspinal soft tissues are within normal limits.  Cervicomedullary junction is within normal limits.  Despite spinal stenosis, described below, no cervical spinal cord signal abnormality.  C2-C3:  Negative.  C3-C4:  Mild circumferential disc bulge.  Mild uncovertebral hypertrophy.  Mild  bilateral C4 foraminal stenosis.  C4-C5:  Circumferential disc osteophyte complex.  Broad-based central right paracentral posterior component of disc.  Spinal stenosis with mild spinal cord flattening.  Mild left C5 foraminal stenosis.  C5-C6:  Circumferential disc osteophyte complex.  Moderate ligament flavum hypertrophy.  Broad-based posterior component of disc. Spinal stenosis with mild spinal cord flattening.  Mild to moderate bilateral C6 foraminal stenosis.  C6-C7:  Circumferential disc osteophyte complex.  Broad-based left eccentric posterior component of disc.  Moderate ligament flavum hypertrophy.  Spinal stenosis with minimal spinal cord mass effect. Moderate to severe left and mild right C7 foraminal stenosis.  C7-T1:  Mild facet hypertrophy.  Mild left greater than right C8 foraminal stenosis.  Negative visualized upper thoracic levels.  IMPRESSION: 1.  Multifactorial cervical spinal stenosis with mild spinal cord mass effect from C4-C5 to C6-C7.  No spinal cord signal abnormality. 2.  Moderate to severe left C7 foraminal stenosis related to disc and endplate changes. Mild to moderate multifactorial bilateral C6 foraminal stenosis. 3.  MRA findings are below.  MRA HEAD WITHOUT CONTRAST  Technique:  Angiographic images of the Circle of Willis were obtained using MRA technique without intravenous contrast.  Findings:  Antegrade flow in the posterior circulation.  Codominant distal vertebral arteries.  Normal right PICA.  Normal vertebrobasilar junction.  Prominent right AICA also noted.  No left PICA or AICA origin identified. Subsequently, the left SCA might be dominant.  No basilar stenosis.  SCA and PCA origins are normal.  Diminutive posterior communicating arteries.  Bilateral PCA branches are within normal limits.  Antegrade flow in both ICA siphons.  Mild supraclinoid ICA irregularity.  No focal ICA stenosis.  Patent carotid termini.  MCA  and ACA origins are within normal limits.  Diminutive or  absent anterior communicating artery.  Visualized ACA branches are within normal limits.  Visualized bilateral MCA branches are within normal limits.  IMPRESSION: Negative intracranial MRA.   Original Report Authenticated By: Erskine Speed, M.D.    Mr Brain Wo Contrast  07/04/2012  *RADIOLOGY REPORT*  Clinical Data:  62 year old male with slurred speech, difficulty moving right shoulder.  Recent left lower extremity amputation.  Comparison:  Head CT 07/01/2012.  MRI HEAD WITHOUT CONTRAST MRI CERVICAL SPINE WITHOUT CONTRAST  Technique:  Multiplanar, multiecho pulse sequences of the brain and surrounding structures, and cervical spine, to include the craniocervical junction and cervicothoracic junction, were obtained without intravenous contrast.  MRI HEAD  Findings:  Punctate cortical focus of restricted diffusion in the left parietal lobe, posterior to the sensory strip (series 5 image 23).  No associated T2 or FLAIR hyperintensity.  Possible additional punctate area of restriction in the left occipital lobe white matter (image 13).  There is a small focus of restricted diffusion along the anterior right cerebellar tonsil (series 5 image 5).  There are to other punctate areas of restricted diffusion in the right cerebellum, one involving the right middle cerebellar peduncle (series 5 image 8). Probably also one additional punctate area of restriction in the dorsal medulla also on this image.  Minimal associated T2 and FLAIR hyperintensity.  Diffusion elsewhere is within normal limits. No acute intracranial hemorrhage identified.  No midline shift, mass effect, or evidence of mass lesion.  No ventriculomegaly. Major intracranial vascular flow voids are preserved, MRA findings are below.  Outside of the above findings, there is only mild for age scattered cerebral white matter T2 and FLAIR hyperintensity, mostly subcortical.  Negative pituitary cervicomedullary junction. Cervical spine findings are below.  Visualized  orbit soft tissues are within normal limits.  Trace mastoid effusions.  Negative visualized nasopharynx. Mild maxillary and ethmoid sinus mucosal thickening.  Negative scalp soft tissues.  IMPRESSION: 1. 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  Two punctate acute infarcts suspected also in the left occipital and parietal lobe. No mass effect or hemorrhage. Given the distribution, embolic phenomena to the posterior circulation is possible.  The supratentorial findings might also reflect synchronous small vessel disease. 2.  No other acute intracranial abnormality and otherwise largely unremarkable for age MRI appearance of the brain.  3.  Cervical spine and MRA findings are below.  MRI CERVICAL SPINE  Findings: Preserved cervical lordosis. No marrow edema or evidence of acute osseous abnormality.  Subcutaneous edema in the posterior paraspinal soft tissues, possible mild involvement of the superficial aspect of the underlying erector spinae muscles.  This may be related to prolonged bedrest, the conditioning. Other Visualized paraspinal soft tissues are within normal limits.  Cervicomedullary junction is within normal limits.  Despite spinal stenosis, described below, no cervical spinal cord signal abnormality.  C2-C3:  Negative.  C3-C4:  Mild circumferential disc bulge.  Mild uncovertebral hypertrophy.  Mild bilateral C4 foraminal stenosis.  C4-C5:  Circumferential disc osteophyte complex.  Broad-based central right paracentral posterior component of disc.  Spinal stenosis with mild spinal cord flattening.  Mild left C5 foraminal stenosis.  C5-C6:  Circumferential disc osteophyte complex.  Moderate ligament flavum hypertrophy.  Broad-based posterior component of disc. Spinal stenosis with mild spinal cord flattening.  Mild to moderate bilateral C6 foraminal stenosis.  C6-C7:  Circumferential disc osteophyte complex.  Broad-based left eccentric posterior component of disc.  Moderate  ligament flavum hypertrophy.  Spinal stenosis with minimal spinal cord mass effect. Moderate to severe left and mild right C7 foraminal stenosis.  C7-T1:  Mild facet hypertrophy.  Mild left greater than right C8 foraminal stenosis.  Negative visualized upper thoracic levels.  IMPRESSION: 1.  Multifactorial cervical spinal stenosis with mild spinal cord mass effect from C4-C5 to C6-C7.  No spinal cord signal abnormality. 2.  Moderate to severe left C7 foraminal stenosis related to disc and endplate changes. Mild to moderate multifactorial bilateral C6 foraminal stenosis. 3.  MRA findings are below.  MRA HEAD WITHOUT CONTRAST  Technique:  Angiographic images of the Circle of Willis were obtained using MRA technique without intravenous contrast.  Findings:  Antegrade flow in the posterior circulation.  Codominant distal vertebral arteries.  Normal right PICA.  Normal vertebrobasilar junction.  Prominent right AICA also noted.  No left PICA or AICA origin identified. Subsequently, the left SCA might be dominant.  No basilar stenosis.  SCA and PCA origins are normal.  Diminutive posterior communicating arteries.  Bilateral PCA branches are within normal limits.  Antegrade flow in both ICA siphons.  Mild supraclinoid ICA irregularity.  No focal ICA stenosis.  Patent carotid termini.  MCA and ACA origins are within normal limits.  Diminutive or absent anterior communicating artery.  Visualized ACA branches are within normal limits.  Visualized bilateral MCA branches are within normal limits.  IMPRESSION: Negative intracranial MRA.   Original Report Authenticated By: Erskine Speed, M.D.    Mr Cervical Spine Wo Contrast  07/04/2012  *RADIOLOGY REPORT*  Clinical Data:  62 year old male with slurred speech, difficulty moving right shoulder.  Recent left lower extremity amputation.  Comparison:  Head CT 07/01/2012.  MRI HEAD WITHOUT CONTRAST MRI CERVICAL SPINE WITHOUT CONTRAST  Technique:  Multiplanar, multiecho pulse sequences  of the brain and surrounding structures, and cervical spine, to include the craniocervical junction and cervicothoracic junction, were obtained without intravenous contrast.  MRI HEAD  Findings:  Punctate cortical focus of restricted diffusion in the left parietal lobe, posterior to the sensory strip (series 5 image 23).  No associated T2 or FLAIR hyperintensity.  Possible additional punctate area of restriction in the left occipital lobe white matter (image 13).  There is a small focus of restricted diffusion along the anterior right cerebellar tonsil (series 5 image 5).  There are to other punctate areas of restricted diffusion in the right cerebellum, one involving the right middle cerebellar peduncle (series 5 image 8). Probably also one additional punctate area of restriction in the dorsal medulla also on this image.  Minimal associated T2 and FLAIR hyperintensity.  Diffusion elsewhere is within normal limits. No acute intracranial hemorrhage identified.  No midline shift, mass effect, or evidence of mass lesion.  No ventriculomegaly. Major intracranial vascular flow voids are preserved, MRA findings are below.  Outside of the above findings, there is only mild for age scattered cerebral white matter T2 and FLAIR hyperintensity, mostly subcortical.  Negative pituitary cervicomedullary junction. Cervical spine findings are below.  Visualized orbit soft tissues are within normal limits.  Trace mastoid effusions.  Negative visualized nasopharynx. Mild maxillary and ethmoid sinus mucosal thickening.  Negative scalp soft tissues.  IMPRESSION: 1. 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  Two punctate acute infarcts suspected also in the left occipital and parietal lobe. No mass effect or hemorrhage. Given the distribution, embolic phenomena to the posterior circulation is possible.  The supratentorial findings might also reflect synchronous small  vessel disease. 2.  No other acute  intracranial abnormality and otherwise largely unremarkable for age MRI appearance of the brain.  3.  Cervical spine and MRA findings are below.  MRI CERVICAL SPINE  Findings: Preserved cervical lordosis. No marrow edema or evidence of acute osseous abnormality.  Subcutaneous edema in the posterior paraspinal soft tissues, possible mild involvement of the superficial aspect of the underlying erector spinae muscles.  This may be related to prolonged bedrest, the conditioning. Other Visualized paraspinal soft tissues are within normal limits.  Cervicomedullary junction is within normal limits.  Despite spinal stenosis, described below, no cervical spinal cord signal abnormality.  C2-C3:  Negative.  C3-C4:  Mild circumferential disc bulge.  Mild uncovertebral hypertrophy.  Mild bilateral C4 foraminal stenosis.  C4-C5:  Circumferential disc osteophyte complex.  Broad-based central right paracentral posterior component of disc.  Spinal stenosis with mild spinal cord flattening.  Mild left C5 foraminal stenosis.  C5-C6:  Circumferential disc osteophyte complex.  Moderate ligament flavum hypertrophy.  Broad-based posterior component of disc. Spinal stenosis with mild spinal cord flattening.  Mild to moderate bilateral C6 foraminal stenosis.  C6-C7:  Circumferential disc osteophyte complex.  Broad-based left eccentric posterior component of disc.  Moderate ligament flavum hypertrophy.  Spinal stenosis with minimal spinal cord mass effect. Moderate to severe left and mild right C7 foraminal stenosis.  C7-T1:  Mild facet hypertrophy.  Mild left greater than right C8 foraminal stenosis.  Negative visualized upper thoracic levels.  IMPRESSION: 1.  Multifactorial cervical spinal stenosis with mild spinal cord mass effect from C4-C5 to C6-C7.  No spinal cord signal abnormality. 2.  Moderate to severe left C7 foraminal stenosis related to disc and endplate changes. Mild to moderate multifactorial bilateral C6 foraminal stenosis.  3.  MRA findings are below.  MRA HEAD WITHOUT CONTRAST  Technique:  Angiographic images of the Circle of Willis were obtained using MRA technique without intravenous contrast.  Findings:  Antegrade flow in the posterior circulation.  Codominant distal vertebral arteries.  Normal right PICA.  Normal vertebrobasilar junction.  Prominent right AICA also noted.  No left PICA or AICA origin identified. Subsequently, the left SCA might be dominant.  No basilar stenosis.  SCA and PCA origins are normal.  Diminutive posterior communicating arteries.  Bilateral PCA branches are within normal limits.  Antegrade flow in both ICA siphons.  Mild supraclinoid ICA irregularity.  No focal ICA stenosis.  Patent carotid termini.  MCA and ACA origins are within normal limits.  Diminutive or absent anterior communicating artery.  Visualized ACA branches are within normal limits.  Visualized bilateral MCA branches are within normal limits.  IMPRESSION: Negative intracranial MRA.   Original Report Authenticated By: Erskine Speed, M.D.      Assessment/Plan: 62 y.o. male with a PMH of DM, diabetic neuropathy, stage III colon cancer status post subtotal colectomy and chemotherapy admitted with LLE gangrene s/p BKA, sepsis. 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   TEE to make sure that b/l embolic strokes are not due to endocarditis caused by septic emboli which is scheduled for AM.  Antibiotic treatment as per ID team.  Start ASA 325.  HbA1c, lipid panel Pt/ot/speech.    07/04/2012, 8:46 PM

## 2012-07-04 NOTE — Progress Notes (Signed)
Pt went for CT shortly p shift change this am, CT revealed CVA. Request for MVA, paged DR. New order. Notified imaging staff.

## 2012-07-04 NOTE — Progress Notes (Signed)
INFECTIOUS DISEASE PROGRESS NOTE  ID: Tyler Erickson is a 62 y.o. male with  Principal Problem:  *Gangrene of foot Active Problems:  ADENOCARCINOMA, COLON  DM  NEUROPATHY  Hyponatremia  Hypokalemia  Metabolic acidosis  AKI (acute kidney injury)  Leukocytosis  Diabetic foot ulcer  Decreased range of motion of shoulder  Septic shock(785.52)  Severe protein-calorie malnutrition  Subjective: Profuse diarrhea (leaking around flexy seal). No abd pain.   Abtx:  Anti-infectives     Start     Dose/Rate Route Frequency Ordered Stop   07/03/12 1600   cefTRIAXone (ROCEPHIN) 2 g in dextrose 5 % 50 mL IVPB        2 g 100 mL/hr over 30 Minutes Intravenous Every 24 hours 07/03/12 1409 07/13/12 1559   06/30/12 2000   cefUROXime (ZINACEF) 1.5 g in dextrose 5 % 50 mL IVPB  Status:  Discontinued        1.5 g 100 mL/hr over 30 Minutes Intravenous Every 12 hours 06/30/12 1954 06/30/12 2001   06/30/12 0645   piperacillin-tazobactam (ZOSYN) IVPB 3.375 g  Status:  Discontinued        3.375 g 100 mL/hr over 30 Minutes Intravenous  Once 06/30/12 0630 06/30/12 0633   06/30/12 0400   vancomycin (VANCOCIN) 500 mg in sodium chloride 0.9 % 100 mL IVPB  Status:  Discontinued        500 mg 100 mL/hr over 60 Minutes Intravenous Every 12 hours 06/29/12 1912 07/01/12 1207   06/29/12 2200   piperacillin-tazobactam (ZOSYN) IVPB 3.375 g  Status:  Discontinued        3.375 g 12.5 mL/hr over 240 Minutes Intravenous 3 times per day 06/29/12 1912 07/03/12 1409   06/29/12 1900   clindamycin (CLEOCIN) IVPB 600 mg  Status:  Discontinued        600 mg 100 mL/hr over 30 Minutes Intravenous 4 times per day 06/29/12 1847 07/03/12 1409   06/29/12 1500   vancomycin (VANCOCIN) IVPB 1000 mg/200 mL premix        1,000 mg 200 mL/hr over 60 Minutes Intravenous  Once 06/29/12 1454 06/29/12 1752   06/29/12 1500   piperacillin-tazobactam (ZOSYN) IVPB 4.5 g  Status:  Discontinued        4.5 g 200 mL/hr over 30 Minutes  Intravenous  Once 06/29/12 1454 06/29/12 1459   06/29/12 1500  piperacillin-tazobactam (ZOSYN) IVPB 3.375 g       3.375 g 100 mL/hr over 30 Minutes Intravenous  Once 06/29/12 1459 06/29/12 1752          Medications:  Scheduled:   . antiseptic oral rinse  15 mL Mouth Rinse BID  . cefTRIAXone (ROCEPHIN)  IV  2 g Intravenous Q24H  . feeding supplement  30 mL Oral TID WC  . furosemide  80 mg Intravenous Q8H  . heparin subcutaneous  5,000 Units Subcutaneous Q8H  . insulin aspart  0-15 Units Subcutaneous TID WC  . insulin glargine  10 Units Subcutaneous Daily  . sodium chloride  3 mL Intravenous Q12H    Objective: Vital signs in last 24 hours: Temp:  [97.3 F (36.3 C)-98.6 F (37 C)] 97.6 F (36.4 C) (01/07 1106) Pulse Rate:  [79-91] 89  (01/07 0700) Resp:  [17-34] 33  (01/07 0700) BP: (111-132)/(59-70) 127/62 mmHg (01/07 0700) SpO2:  [94 %-100 %] 100 % (01/07 0353)   General appearance: alert, cooperative and no distress Resp: clear to auscultation bilaterally Cardio: regular rate and rhythm GI: normal findings:  bowel sounds normal and soft, non-tender Incision/Wound: clean. Non-tender  Lab Results  Basename 07/03/12 1600 07/03/12 0719 07/03/12 0717 07/02/12 0458  WBC -- -- 15.7* 12.6*  HGB -- -- 9.4* 8.9*  HCT -- -- 26.9* 25.5*  NA 141 141 -- --  K 3.6 3.7 -- --  CL 107 107 -- --  CO2 23 21 -- --  BUN 69* 73* -- --  CREATININE 2.12* 2.11* -- --  GLU -- -- -- --   Liver Panel  Basename 07/03/12 0719 07/02/12 0458  PROT 5.9* 5.0*  ALBUMIN 0.9* 0.9*  AST 73* 91*  ALT 31 30  ALKPHOS 152* 73  BILITOT 2.6* 2.6*  BILIDIR -- --  IBILI -- --   Sedimentation Rate No results found for this basename: ESRSEDRATE in the last 72 hours C-Reactive Protein No results found for this basename: CRP:2 in the last 72 hours  Microbiology: Recent Results (from the past 240 hour(s))  CULTURE, BLOOD (ROUTINE X 2)     Status: Normal   Collection Time   06/29/12  2:15 PM       Component Value Range Status Comment   Specimen Description BLOOD RIGHT HAND   Final    Special Requests BOTTLES DRAWN AEROBIC AND ANAEROBIC 4CC EACH   Final    Culture  Setup Time 06/29/2012 21:41   Final    Culture     Final    Value: GROUP A STREP (S.PYOGENES) ISOLATED     Note: CRITICAL RESULT CALLED TO, READ BACK BY AND VERIFIED WITH: CHRIS ARMITAGE 07/01/12 @ 10:45AM BY RUSCA.     Note: Gram Stain Report Called to,Read Back By and Verified With: TERESA Antony Salmon 06/30/12 1040 BY SMITHERSJ   Report Status 07/01/2012 FINAL   Final   CULTURE, BLOOD (ROUTINE X 2)     Status: Normal   Collection Time   06/29/12  2:20 PM      Component Value Range Status Comment   Specimen Description BLOOD RIGHT THUMB   Final    Special Requests BOTTLES DRAWN AEROBIC ONLY 3CC   Final    Culture  Setup Time 06/29/2012 21:40   Final    Culture     Final    Value: GROUP A STREP (S.PYOGENES) ISOLATED     Note: CRITICAL RESULT CALLED TO, READ BACK BY AND VERIFIED WITH: CHRIS ARMITAGE 07/01/12 @ 10:45AM BY RUSCA.     Note: PAM ALVORD 06/30/12 1135 BY SMITHERSJ   Report Status 07/01/2012 FINAL   Final   URINE CULTURE     Status: Normal   Collection Time   06/29/12  4:01 PM      Component Value Range Status Comment   Specimen Description URINE, CLEAN CATCH   Final    Special Requests NONE   Final    Culture  Setup Time 06/30/2012 02:24   Final    Colony Count NO GROWTH   Final    Culture NO GROWTH   Final    Report Status 07/01/2012 FINAL   Final   MRSA PCR SCREENING     Status: Normal   Collection Time   06/29/12  8:56 PM      Component Value Range Status Comment   MRSA by PCR NEGATIVE  NEGATIVE Final   SURGICAL PCR SCREEN     Status: Normal   Collection Time   06/30/12 10:08 AM      Component Value Range Status Comment   MRSA, PCR NEGATIVE  NEGATIVE Final  Staphylococcus aureus NEGATIVE  NEGATIVE Final     Studies/Results: Mr Shirlee Latch Wo Contrast  07/04/2012  *RADIOLOGY REPORT*  Clinical Data:  62 year old  male with slurred speech, difficulty moving right shoulder.  Recent left lower extremity amputation.  Comparison:  Head CT 07/01/2012.  MRI HEAD WITHOUT CONTRAST MRI CERVICAL SPINE WITHOUT CONTRAST  Technique:  Multiplanar, multiecho pulse sequences of the brain and surrounding structures, and cervical spine, to include the craniocervical junction and cervicothoracic junction, were obtained without intravenous contrast.  MRI HEAD  Findings:  Punctate cortical focus of restricted diffusion in the left parietal lobe, posterior to the sensory strip (series 5 image 23).  No associated T2 or FLAIR hyperintensity.  Possible additional punctate area of restriction in the left occipital lobe white matter (image 13).  There is a small focus of restricted diffusion along the anterior right cerebellar tonsil (series 5 image 5).  There are to other punctate areas of restricted diffusion in the right cerebellum, one involving the right middle cerebellar peduncle (series 5 image 8). Probably also one additional punctate area of restriction in the dorsal medulla also on this image.  Minimal associated T2 and FLAIR hyperintensity.  Diffusion elsewhere is within normal limits. No acute intracranial hemorrhage identified.  No midline shift, mass effect, or evidence of mass lesion.  No ventriculomegaly. Major intracranial vascular flow voids are preserved, MRA findings are below.  Outside of the above findings, there is only mild for age scattered cerebral white matter T2 and FLAIR hyperintensity, mostly subcortical.  Negative pituitary cervicomedullary junction. Cervical spine findings are below.  Visualized orbit soft tissues are within normal limits.  Trace mastoid effusions.  Negative visualized nasopharynx. Mild maxillary and ethmoid sinus mucosal thickening.  Negative scalp soft tissues.  IMPRESSION: 1. 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  Two punctate acute infarcts suspected also in the left  occipital and parietal lobe. No mass effect or hemorrhage. Given the distribution, embolic phenomena to the posterior circulation is possible.  The supratentorial findings might also reflect synchronous small vessel disease. 2.  No other acute intracranial abnormality and otherwise largely unremarkable for age MRI appearance of the brain.  3.  Cervical spine and MRA findings are below.  MRI CERVICAL SPINE  Findings: Preserved cervical lordosis. No marrow edema or evidence of acute osseous abnormality.  Subcutaneous edema in the posterior paraspinal soft tissues, possible mild involvement of the superficial aspect of the underlying erector spinae muscles.  This may be related to prolonged bedrest, the conditioning. Other Visualized paraspinal soft tissues are within normal limits.  Cervicomedullary junction is within normal limits.  Despite spinal stenosis, described below, no cervical spinal cord signal abnormality.  C2-C3:  Negative.  C3-C4:  Mild circumferential disc bulge.  Mild uncovertebral hypertrophy.  Mild bilateral C4 foraminal stenosis.  C4-C5:  Circumferential disc osteophyte complex.  Broad-based central right paracentral posterior component of disc.  Spinal stenosis with mild spinal cord flattening.  Mild left C5 foraminal stenosis.  C5-C6:  Circumferential disc osteophyte complex.  Moderate ligament flavum hypertrophy.  Broad-based posterior component of disc. Spinal stenosis with mild spinal cord flattening.  Mild to moderate bilateral C6 foraminal stenosis.  C6-C7:  Circumferential disc osteophyte complex.  Broad-based left eccentric posterior component of disc.  Moderate ligament flavum hypertrophy.  Spinal stenosis with minimal spinal cord mass effect. Moderate to severe left and mild right C7 foraminal stenosis.  C7-T1:  Mild facet hypertrophy.  Mild left greater than right C8 foraminal stenosis.  Negative visualized upper thoracic levels.  IMPRESSION: 1.  Multifactorial cervical spinal stenosis  with mild spinal cord mass effect from C4-C5 to C6-C7.  No spinal cord signal abnormality. 2.  Moderate to severe left C7 foraminal stenosis related to disc and endplate changes. Mild to moderate multifactorial bilateral C6 foraminal stenosis. 3.  MRA findings are below.  MRA HEAD WITHOUT CONTRAST  Technique:  Angiographic images of the Circle of Willis were obtained using MRA technique without intravenous contrast.  Findings:  Antegrade flow in the posterior circulation.  Codominant distal vertebral arteries.  Normal right PICA.  Normal vertebrobasilar junction.  Prominent right AICA also noted.  No left PICA or AICA origin identified. Subsequently, the left SCA might be dominant.  No basilar stenosis.  SCA and PCA origins are normal.  Diminutive posterior communicating arteries.  Bilateral PCA branches are within normal limits.  Antegrade flow in both ICA siphons.  Mild supraclinoid ICA irregularity.  No focal ICA stenosis.  Patent carotid termini.  MCA and ACA origins are within normal limits.  Diminutive or absent anterior communicating artery.  Visualized ACA branches are within normal limits.  Visualized bilateral MCA branches are within normal limits.  IMPRESSION: Negative intracranial MRA.   Original Report Authenticated By: Erskine Speed, M.D.    Mr Brain Wo Contrast  07/04/2012  *RADIOLOGY REPORT*  Clinical Data:  62 year old male with slurred speech, difficulty moving right shoulder.  Recent left lower extremity amputation.  Comparison:  Head CT 07/01/2012.  MRI HEAD WITHOUT CONTRAST MRI CERVICAL SPINE WITHOUT CONTRAST  Technique:  Multiplanar, multiecho pulse sequences of the brain and surrounding structures, and cervical spine, to include the craniocervical junction and cervicothoracic junction, were obtained without intravenous contrast.  MRI HEAD  Findings:  Punctate cortical focus of restricted diffusion in the left parietal lobe, posterior to the sensory strip (series 5 image 23).  No associated T2  or FLAIR hyperintensity.  Possible additional punctate area of restriction in the left occipital lobe white matter (image 13).  There is a small focus of restricted diffusion along the anterior right cerebellar tonsil (series 5 image 5).  There are to other punctate areas of restricted diffusion in the right cerebellum, one involving the right middle cerebellar peduncle (series 5 image 8). Probably also one additional punctate area of restriction in the dorsal medulla also on this image.  Minimal associated T2 and FLAIR hyperintensity.  Diffusion elsewhere is within normal limits. No acute intracranial hemorrhage identified.  No midline shift, mass effect, or evidence of mass lesion.  No ventriculomegaly. Major intracranial vascular flow voids are preserved, MRA findings are below.  Outside of the above findings, there is only mild for age scattered cerebral white matter T2 and FLAIR hyperintensity, mostly subcortical.  Negative pituitary cervicomedullary junction. Cervical spine findings are below.  Visualized orbit soft tissues are within normal limits.  Trace mastoid effusions.  Negative visualized nasopharynx. Mild maxillary and ethmoid sinus mucosal thickening.  Negative scalp soft tissues.  IMPRESSION: 1. 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  Two punctate acute infarcts suspected also in the left occipital and parietal lobe. No mass effect or hemorrhage. Given the distribution, embolic phenomena to the posterior circulation is possible.  The supratentorial findings might also reflect synchronous small vessel disease. 2.  No other acute intracranial abnormality and otherwise largely unremarkable for age MRI appearance of the brain.  3.  Cervical spine and MRA findings are below.  MRI CERVICAL SPINE  Findings: Preserved cervical  lordosis. No marrow edema or evidence of acute osseous abnormality.  Subcutaneous edema in the posterior paraspinal soft tissues, possible mild involvement  of the superficial aspect of the underlying erector spinae muscles.  This may be related to prolonged bedrest, the conditioning. Other Visualized paraspinal soft tissues are within normal limits.  Cervicomedullary junction is within normal limits.  Despite spinal stenosis, described below, no cervical spinal cord signal abnormality.  C2-C3:  Negative.  C3-C4:  Mild circumferential disc bulge.  Mild uncovertebral hypertrophy.  Mild bilateral C4 foraminal stenosis.  C4-C5:  Circumferential disc osteophyte complex.  Broad-based central right paracentral posterior component of disc.  Spinal stenosis with mild spinal cord flattening.  Mild left C5 foraminal stenosis.  C5-C6:  Circumferential disc osteophyte complex.  Moderate ligament flavum hypertrophy.  Broad-based posterior component of disc. Spinal stenosis with mild spinal cord flattening.  Mild to moderate bilateral C6 foraminal stenosis.  C6-C7:  Circumferential disc osteophyte complex.  Broad-based left eccentric posterior component of disc.  Moderate ligament flavum hypertrophy.  Spinal stenosis with minimal spinal cord mass effect. Moderate to severe left and mild right C7 foraminal stenosis.  C7-T1:  Mild facet hypertrophy.  Mild left greater than right C8 foraminal stenosis.  Negative visualized upper thoracic levels.  IMPRESSION: 1.  Multifactorial cervical spinal stenosis with mild spinal cord mass effect from C4-C5 to C6-C7.  No spinal cord signal abnormality. 2.  Moderate to severe left C7 foraminal stenosis related to disc and endplate changes. Mild to moderate multifactorial bilateral C6 foraminal stenosis. 3.  MRA findings are below.  MRA HEAD WITHOUT CONTRAST  Technique:  Angiographic images of the Circle of Willis were obtained using MRA technique without intravenous contrast.  Findings:  Antegrade flow in the posterior circulation.  Codominant distal vertebral arteries.  Normal right PICA.  Normal vertebrobasilar junction.  Prominent right AICA also  noted.  No left PICA or AICA origin identified. Subsequently, the left SCA might be dominant.  No basilar stenosis.  SCA and PCA origins are normal.  Diminutive posterior communicating arteries.  Bilateral PCA branches are within normal limits.  Antegrade flow in both ICA siphons.  Mild supraclinoid ICA irregularity.  No focal ICA stenosis.  Patent carotid termini.  MCA and ACA origins are within normal limits.  Diminutive or absent anterior communicating artery.  Visualized ACA branches are within normal limits.  Visualized bilateral MCA branches are within normal limits.  IMPRESSION: Negative intracranial MRA.   Original Report Authenticated By: Erskine Speed, M.D.    Mr Cervical Spine Wo Contrast  07/04/2012  *RADIOLOGY REPORT*  Clinical Data:  62 year old male with slurred speech, difficulty moving right shoulder.  Recent left lower extremity amputation.  Comparison:  Head CT 07/01/2012.  MRI HEAD WITHOUT CONTRAST MRI CERVICAL SPINE WITHOUT CONTRAST  Technique:  Multiplanar, multiecho pulse sequences of the brain and surrounding structures, and cervical spine, to include the craniocervical junction and cervicothoracic junction, were obtained without intravenous contrast.  MRI HEAD  Findings:  Punctate cortical focus of restricted diffusion in the left parietal lobe, posterior to the sensory strip (series 5 image 23).  No associated T2 or FLAIR hyperintensity.  Possible additional punctate area of restriction in the left occipital lobe white matter (image 13).  There is a small focus of restricted diffusion along the anterior right cerebellar tonsil (series 5 image 5).  There are to other punctate areas of restricted diffusion in the right cerebellum, one involving the right middle cerebellar peduncle (series 5 image 8). Probably also  one additional punctate area of restriction in the dorsal medulla also on this image.  Minimal associated T2 and FLAIR hyperintensity.  Diffusion elsewhere is within normal limits.  No acute intracranial hemorrhage identified.  No midline shift, mass effect, or evidence of mass lesion.  No ventriculomegaly. Major intracranial vascular flow voids are preserved, MRA findings are below.  Outside of the above findings, there is only mild for age scattered cerebral white matter T2 and FLAIR hyperintensity, mostly subcortical.  Negative pituitary cervicomedullary junction. Cervical spine findings are below.  Visualized orbit soft tissues are within normal limits.  Trace mastoid effusions.  Negative visualized nasopharynx. Mild maxillary and ethmoid sinus mucosal thickening.  Negative scalp soft tissues.  IMPRESSION: 1. 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  Two punctate acute infarcts suspected also in the left occipital and parietal lobe. No mass effect or hemorrhage. Given the distribution, embolic phenomena to the posterior circulation is possible.  The supratentorial findings might also reflect synchronous small vessel disease. 2.  No other acute intracranial abnormality and otherwise largely unremarkable for age MRI appearance of the brain.  3.  Cervical spine and MRA findings are below.  MRI CERVICAL SPINE  Findings: Preserved cervical lordosis. No marrow edema or evidence of acute osseous abnormality.  Subcutaneous edema in the posterior paraspinal soft tissues, possible mild involvement of the superficial aspect of the underlying erector spinae muscles.  This may be related to prolonged bedrest, the conditioning. Other Visualized paraspinal soft tissues are within normal limits.  Cervicomedullary junction is within normal limits.  Despite spinal stenosis, described below, no cervical spinal cord signal abnormality.  C2-C3:  Negative.  C3-C4:  Mild circumferential disc bulge.  Mild uncovertebral hypertrophy.  Mild bilateral C4 foraminal stenosis.  C4-C5:  Circumferential disc osteophyte complex.  Broad-based central right paracentral posterior component of disc.   Spinal stenosis with mild spinal cord flattening.  Mild left C5 foraminal stenosis.  C5-C6:  Circumferential disc osteophyte complex.  Moderate ligament flavum hypertrophy.  Broad-based posterior component of disc. Spinal stenosis with mild spinal cord flattening.  Mild to moderate bilateral C6 foraminal stenosis.  C6-C7:  Circumferential disc osteophyte complex.  Broad-based left eccentric posterior component of disc.  Moderate ligament flavum hypertrophy.  Spinal stenosis with minimal spinal cord mass effect. Moderate to severe left and mild right C7 foraminal stenosis.  C7-T1:  Mild facet hypertrophy.  Mild left greater than right C8 foraminal stenosis.  Negative visualized upper thoracic levels.  IMPRESSION: 1.  Multifactorial cervical spinal stenosis with mild spinal cord mass effect from C4-C5 to C6-C7.  No spinal cord signal abnormality. 2.  Moderate to severe left C7 foraminal stenosis related to disc and endplate changes. Mild to moderate multifactorial bilateral C6 foraminal stenosis. 3.  MRA findings are below.  MRA HEAD WITHOUT CONTRAST  Technique:  Angiographic images of the Circle of Willis were obtained using MRA technique without intravenous contrast.  Findings:  Antegrade flow in the posterior circulation.  Codominant distal vertebral arteries.  Normal right PICA.  Normal vertebrobasilar junction.  Prominent right AICA also noted.  No left PICA or AICA origin identified. Subsequently, the left SCA might be dominant.  No basilar stenosis.  SCA and PCA origins are normal.  Diminutive posterior communicating arteries.  Bilateral PCA branches are within normal limits.  Antegrade flow in both ICA siphons.  Mild supraclinoid ICA irregularity.  No focal ICA stenosis.  Patent carotid termini.  MCA and ACA origins are within normal limits.  Diminutive or absent anterior communicating artery.  Visualized ACA branches are within normal limits.  Visualized bilateral MCA branches are within normal limits.   IMPRESSION: Negative intracranial MRA.   Original Report Authenticated By: Erskine Speed, M.D.      Assessment/Plan: Group A strep bacteremia Gangrene s/p L BKA DM Embolic CNS disease Diarrhea  c diff (-) Can d/c isolation. Start florastor? Would check TEE Continue ceftriaxone at least 28 days due to his CNS findings Repeat BCx 07-03-12 pending  Total days of antibiotics 6 (ceftriaxone)         Johny Sax Infectious Diseases [phone] 07/04/2012, 11:06 AM   LOS: 5 days

## 2012-07-04 NOTE — Progress Notes (Signed)
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 male who is S/P Left Procedure(s): AMPUTATION BELOW KNEE INTRA OPERATIVE ARTERIOGRAM AORTOGRAM COMPARTMENT MEASUREMENT right upper.  Pt.denies increased pain in the stump. The patient notes pain is well controlled. Pt. denies phantom pain.  Significant Diagnostic Studies: CBC Lab Results  Component Value Date   WBC 15.7* 07/03/2012   HGB 9.4* 07/03/2012   HCT 26.9* 07/03/2012   MCV 83.3 07/03/2012   PLT 85* 07/03/2012    BMET    Component Value Date/Time   NA 141 07/03/2012 1600   K 3.6 07/03/2012 1600   CL 107 07/03/2012 1600   CO2 23 07/03/2012 1600   GLUCOSE 154* 07/03/2012 1600   BUN 69* 07/03/2012 1600   CREATININE 2.12* 07/03/2012 1600   CALCIUM 7.2* 07/03/2012 1600   GFRNONAA 32* 07/03/2012 1600   GFRAA 37* 07/03/2012 1600    COAG Lab Results  Component Value Date   INR 1.40 06/30/2012   INR 1.41 06/29/2012   INR 1.07 10/28/2009   No results found for this basename: PTT     Intake/Output Summary (Last 24 hours) at 07/04/12 0750 Last data filed at 07/04/12 0737  Gross per 24 hour  Intake 1342.5 ml  Output   6755 ml  Net -5412.5 ml   Patient Vitals for the past 24 hrs:  Stool Color  07/03/12 0800 Brown     Physical Examination  BP Readings from Last 3 Encounters:  07/04/12 127/62  07/04/12 127/62  03/17/09 118/64   Temp Readings from Last 3 Encounters:  07/04/12 97.4 F (36.3 C) Axillary  07/04/12 97.4 F (36.3 C) Axillary   SpO2 Readings from Last 3 Encounters:  07/04/12 100%  07/04/12 100%   Pulse Readings from Last 3 Encounters:  07/04/12 89  07/04/12 89  03/17/09 72    Pt is A&Ox3  WDWN male with no  complaints  Left amputation wound is healing well.  There is good bone coverage in the stump Stump is warm and well perfused, without drainage; without erythema Right upper extremity no active motion or sensation. Left upper extremity improved motion and decreased edema.  Assessment/plan:  Tyler Erickson is a 62 y.o. male who is s/p Left Procedure(s): AMPUTATION BELOW KNEE INTRA OPERATIVE ARTERIOGRAM AORTOGRAM COMPARTMENT MEASUREMENT  Patient is getting upset and states That if the his right arm is not going to get better "just take it off."   The patient's stump is viable.  Follow-up 4 weeks from surgery  [facility] 7:50 AM 07/04/[phone]

## 2012-07-05 ENCOUNTER — Encounter (HOSPITAL_COMMUNITY)
Admission: EM | Disposition: A | Discharge status: Skilled Nursing Facility | Payer: Self-pay | Source: Home / Self Care | Attending: Internal Medicine

## 2012-07-05 DIAGNOSIS — E876 Hypokalemia: Secondary | ICD-10-CM

## 2012-07-05 DIAGNOSIS — E860 Dehydration: Secondary | ICD-10-CM

## 2012-07-05 DIAGNOSIS — I6789 Other cerebrovascular disease: Secondary | ICD-10-CM

## 2012-07-05 HISTORY — PX: TEE WITHOUT CARDIOVERSION: SHX5443

## 2012-07-05 LAB — BLOOD GAS, ARTERIAL
Acid-Base Excess: 4.1 mmol/L — ABNORMAL HIGH (ref 0.0–2.0)
Bicarbonate: 19.9 mEq/L — ABNORMAL LOW (ref 20.0–24.0)
O2 Saturation: 99.4 %
PEEP: 5 cmH2O
Patient temperature: 98.6
TCO2: 20.9 mmol/L (ref 0–100)

## 2012-07-05 LAB — CBC
Hemoglobin: 9.6 g/dL — ABNORMAL LOW (ref 13.0–17.0)
MCH: 28.2 pg (ref 26.0–34.0)
MCV: 81.5 fL (ref 78.0–100.0)
RBC: 3.41 MIL/uL — ABNORMAL LOW (ref 4.22–5.81)

## 2012-07-05 LAB — GLUCOSE, CAPILLARY: Glucose-Capillary: 162 mg/dL — ABNORMAL HIGH (ref 70–99)

## 2012-07-05 LAB — BASIC METABOLIC PANEL
BUN: 62 mg/dL — ABNORMAL HIGH (ref 6–23)
CO2: 30 mEq/L (ref 19–32)
Calcium: 7.5 mg/dL — ABNORMAL LOW (ref 8.4–10.5)
Glucose, Bld: 188 mg/dL — ABNORMAL HIGH (ref 70–99)
Sodium: 137 mEq/L (ref 135–145)

## 2012-07-05 SURGERY — ECHOCARDIOGRAM, TRANSESOPHAGEAL
Anesthesia: Moderate Sedation

## 2012-07-05 MED ORDER — POTASSIUM CHLORIDE 10 MEQ/100ML IV SOLN
10.0000 meq | INTRAVENOUS | Status: AC
  Administered 2012-07-05 (×3): 10 meq via INTRAVENOUS
  Filled 2012-07-05: qty 100

## 2012-07-05 MED ORDER — MIDAZOLAM HCL 5 MG/ML IJ SOLN
INTRAMUSCULAR | Status: AC
  Filled 2012-07-05: qty 2

## 2012-07-05 MED ORDER — INSULIN GLARGINE 100 UNIT/ML ~~LOC~~ SOLN
14.0000 [IU] | Freq: Every day | SUBCUTANEOUS | Status: DC
  Administered 2012-07-06 – 2012-07-07 (×2): 14 [IU] via SUBCUTANEOUS
  Administered 2012-07-08: 10 [IU] via SUBCUTANEOUS
  Administered 2012-07-09 – 2012-07-11 (×3): 14 [IU] via SUBCUTANEOUS

## 2012-07-05 MED ORDER — ASPIRIN 325 MG PO TABS
325.0000 mg | ORAL_TABLET | Freq: Every day | ORAL | Status: DC
  Administered 2012-07-05 – 2012-07-11 (×7): 325 mg via ORAL
  Filled 2012-07-05 (×8): qty 1

## 2012-07-05 MED ORDER — FENTANYL CITRATE 0.05 MG/ML IJ SOLN
INTRAMUSCULAR | Status: DC | PRN
  Administered 2012-07-05 (×2): 25 ug via INTRAVENOUS

## 2012-07-05 MED ORDER — FENTANYL CITRATE 0.05 MG/ML IJ SOLN
INTRAMUSCULAR | Status: AC
  Filled 2012-07-05: qty 2

## 2012-07-05 MED ORDER — POTASSIUM CHLORIDE CRYS ER 20 MEQ PO TBCR
40.0000 meq | EXTENDED_RELEASE_TABLET | Freq: Two times a day (BID) | ORAL | Status: AC
  Administered 2012-07-05 – 2012-07-06 (×3): 40 meq via ORAL
  Filled 2012-07-05 (×3): qty 2

## 2012-07-05 MED ORDER — BUTAMBEN-TETRACAINE-BENZOCAINE 2-2-14 % EX AERO
INHALATION_SPRAY | CUTANEOUS | Status: DC | PRN
  Administered 2012-07-05: 2 via TOPICAL

## 2012-07-05 MED ORDER — MIDAZOLAM HCL 10 MG/2ML IJ SOLN
INTRAMUSCULAR | Status: DC | PRN
  Administered 2012-07-05: 2 mg via INTRAVENOUS
  Administered 2012-07-05 (×2): 1 mg via INTRAVENOUS

## 2012-07-05 MED ORDER — SODIUM CHLORIDE 0.9 % IJ SOLN
INTRAMUSCULAR | Status: AC
  Administered 2012-07-05: 04:00:00
  Filled 2012-07-05: qty 20

## 2012-07-05 MED ORDER — OXYCODONE HCL 5 MG PO TABS
5.0000 mg | ORAL_TABLET | ORAL | Status: DC | PRN
  Administered 2012-07-09: 10 mg via ORAL
  Filled 2012-07-05: qty 2

## 2012-07-05 NOTE — Progress Notes (Signed)
VASCULAR LAB PRELIMINARY  PRELIMINARY  PRELIMINARY  PRELIMINARY  Carotid duplex  completed.    Preliminary report:  Bilateral:  No evidence of hemodynamically significant internal carotid artery stenosis.   Vertebral artery flow is antegrade.      Blythe Hartshorn, RVT 07/05/2012, 2:25 PM

## 2012-07-05 NOTE — H&P (View-Only) (Signed)
[facility] PROGRESS NOTE  JEN EPPINGER RUE:[id number] DOB: 08-20-1950 DOA: 06/29/2012 PCP: Benita Stabile, MD  Brief narrative: 62 yo male smoker admitted 06/29/2012 with chills and Lt foot pain from Lt foot gangrene and septic shock. 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  Developed rt arm pain on 1/3  LINES / TUBES:  1/03 R IJ CVL >>  1/03 ETT >> 1/5  1/03 Rt femoral Aline >>  CULTURES:  1/2 blood >> GPC in chains >> Group A strep sens pending.  1/2 urine >> negative  ANTIBIOTICS:  1/2 clindamycin >> 1/6 1/2 zosyn >> 1/6 1/3 vancomycin >> 1/3 Rocephin 1/6 >>  TESTS:  1/03 RUE arteriogram >> Normal arch anatomy, diffusely atherosclerotic right upper extremity arterial tree unreconstructable   Assessment/Plan: Principal Problem:  *Gangrene of foot Stable - followed by vascular surgery  Active Problems:  Group A strep bacteremia/ Septic shock(785.52) Total 6 wks of antibiotics - currently on Rocephin  Right hand pain Work up included CT and MRI brain which reveals multiple embolic infarcts- do not feel that they are causing his right hand pain/ numbness/ weakness- suspect brachial plexus abnormality will be consulting Neuro  Embolic infarcts Have discussed TEE with [place] [facility]- will make NPO for AM  Diarrhea Start Lomotil- probiotics also started by ID H/o colon CA- per pts brother in law, he had diarrhea before admission Will need CT abdomen to f/u as his last follow up was 2011   DM Only takes metformin at home but sugars run in 300s-  a1c was 10.1 therefore he will need insulin on d/c   NEUROPATHY Never took medications for this- pain was in legs   AKI (acute kidney injury) Baseline cr about 1.4  Anasarca Due to hypoalbuminemia and sepsis Appears to have resolved- was given diuretics and albumin Has been diuresed an is in negative balance by 5600 cc- will d/c diuretics now and monitor   Severe protein-calorie malnutrition Pt has a great appetite- just  needs to be fed due to right hand pain Add glucerna in between meals   Code Status: full code Family Communication: sister and brother in law Disposition Plan: cont to follow in SDU DVT prophylaxis: heparin   Consultants:  vasc surgery  ID  HPI/Subjective: Pt alert, admits to pain in right hand, also states left hand/arm was weak as well but has improved. His sister is at bedside and tells me that pt does not follow up with his PCP - has not seen him in years- does not take care of himself.    Objective: Filed Vitals:   07/04/12 0650 07/04/12 0700 07/04/12 1106 07/04/12 1635  BP: 132/70 127/62 135/67   Pulse: 91 89    Temp: 98.4 F (36.9 C) 97.4 F (36.3 C) 97.6 F (36.4 C) 97.4 F (36.3 C)  TempSrc: Axillary Axillary Axillary Axillary  Resp: 33 33    Height:      Weight:      SpO2:   93%     Intake/Output Summary (Last 24 hours) at 07/04/12 1817 Last data filed at 07/04/12 1620  Gross per 24 hour  Intake    820 ml  Output  14782 ml  Net  -9255 ml    Exam:   General:  Alert, no distress  Cardiovascular: RRR, no murmurs  Respiratory:  CTA b/l   Abdomen: soft, NT, ND, BS+- rectal tube in place  Ext: left BKA- right leg no c/c/e  Neuro- right hand 2/5 with subjective pain and numbness-  left arm and right leg 5/5, did not examine left leg  Data Reviewed: Basic Metabolic Panel:  Lab 07/03/12 4098 07/03/12 0719 07/02/12 0458 07/01/12 0635 06/30/12 2201 06/29/12 1420  NA 141 141 139 140 137 --  K 3.6 3.7 3.3* 3.4* 3.2* --  CL 107 107 106 107 104 --  CO2 23 21 20 20  15* --  GLUCOSE 154* 165* 129* 214* 251* --  BUN 69* 73* 69* 61* 55* --  CREATININE 2.12* 2.11* 2.21* 1.80* 1.67* --  CALCIUM 7.2* 6.8* 6.7* 6.2* 6.4* --  MG -- -- 2.4 2.1 -- 2.6*  PHOS -- -- 2.8 1.9* -- --   Liver Function Tests:  Lab 07/03/12 0719 07/02/12 0458 07/01/12 0635 06/30/12 0609 06/29/12 1355  AST 73* 91* 154* 66* 34  ALT 31 30 46 12 10  ALKPHOS 152* 73 49 46 105  BILITOT  2.6* 2.6* 1.8* 1.2 0.8  PROT 5.9* 5.0* 4.5* 4.7* 6.0  ALBUMIN 0.9* 0.9* 1.0* 1.2* 1.9*   No results found for this basename: LIPASE:5,AMYLASE:5 in the last 168 hours No results found for this basename: AMMONIA:5 in the last 168 hours CBC:  Lab 07/03/12 0717 07/02/12 0458 07/01/12 0635 06/30/12 0609 06/29/12 1420  WBC 15.7* 12.6* 9.7 8.9 18.5*  NEUTROABS -- -- -- -- --  HGB 9.4* 8.9* 10.2* 11.8* 13.4  HCT 26.9* 25.5* 28.8* 33.6* 37.1*  MCV 83.3 80.7 79.3 82.2 80.3  PLT 85* PLATELET CLUMPS NOTED ON SMEAR, COUNT APPEARS DECREASED 113* 111* 169   Cardiac Enzymes:  Lab 06/30/12 0609  CKTOTAL --  CKMB --  CKMBINDEX --  TROPONINI <0.30   BNP (last 3 results) No results found for this basename: PROBNP:3 in the last 8760 hours CBG:  Lab 07/04/12 1633 07/04/12 1121 07/04/12 0804 07/03/12 2148 07/03/12 1636  GLUCAP 156* 171* 172* 121* 151*    Recent Results (from the past 240 hour(s))  CULTURE, BLOOD (ROUTINE X 2)     Status: Normal   Collection Time   06/29/12  2:15 PM      Component Value Range Status Comment   Specimen Description BLOOD RIGHT HAND   Final    Special Requests BOTTLES DRAWN AEROBIC AND ANAEROBIC 4CC EACH   Final    Culture  Setup Time 06/29/2012 21:41   Final    Culture     Final    Value: GROUP A STREP (S.PYOGENES) ISOLATED     Note: CRITICAL RESULT CALLED TO, READ BACK BY AND VERIFIED WITH: CHRIS ARMITAGE 07/01/12 @ 10:45AM BY RUSCA.     Note: Gram Stain Report Called to,Read Back By and Verified With: TERESA Antony Salmon 06/30/12 1040 BY SMITHERSJ   Report Status 07/01/2012 FINAL   Final   CULTURE, BLOOD (ROUTINE X 2)     Status: Normal   Collection Time   06/29/12  2:20 PM      Component Value Range Status Comment   Specimen Description BLOOD RIGHT THUMB   Final    Special Requests BOTTLES DRAWN AEROBIC ONLY 3CC   Final    Culture  Setup Time 06/29/2012 21:40   Final    Culture     Final    Value: GROUP A STREP (S.PYOGENES) ISOLATED     Note: CRITICAL RESULT CALLED  TO, READ BACK BY AND VERIFIED WITH: CHRIS ARMITAGE 07/01/12 @ 10:45AM BY RUSCA.     Note: PAM ALVORD 06/30/12 1135 BY SMITHERSJ   Report Status 07/01/2012 FINAL   Final   URINE CULTURE  Status: Normal   Collection Time   06/29/12  4:01 PM      Component Value Range Status Comment   Specimen Description URINE, CLEAN CATCH   Final    Special Requests NONE   Final    Culture  Setup Time 06/30/2012 02:24   Final    Colony Count NO GROWTH   Final    Culture NO GROWTH   Final    Report Status 07/01/2012 FINAL   Final   MRSA PCR SCREENING     Status: Normal   Collection Time   06/29/12  8:56 PM      Component Value Range Status Comment   MRSA by PCR NEGATIVE  NEGATIVE Final   SURGICAL PCR SCREEN     Status: Normal   Collection Time   06/30/12 10:08 AM      Component Value Range Status Comment   MRSA, PCR NEGATIVE  NEGATIVE Final    Staphylococcus aureus NEGATIVE  NEGATIVE Final   CLOSTRIDIUM DIFFICILE BY PCR     Status: Normal   Collection Time   07/04/12  4:00 AM      Component Value Range Status Comment   C difficile by pcr NEGATIVE  NEGATIVE Final      Studies: Dg Chest 2 View  06/29/2012  *RADIOLOGY REPORT*  Clinical Data: 62 year old male with weakness and swelling.  CHEST - 2 VIEW  Comparison: 10/28/2009 and prior chest radiographs  Findings: The cardiomediastinal silhouette is unremarkable. Mild peribronchial thickening is stable. There is no evidence of focal airspace disease, pulmonary edema, suspicious pulmonary nodule/mass, pleural effusion, or pneumothorax. No acute bony abnormalities are identified.  IMPRESSION: No evidence of active cardiopulmonary disease.   Original Report Authenticated By: Harmon Pier, M.D.    Dg Shoulder Right  06/29/2012  *RADIOLOGY REPORT*  Clinical Data: 62 year old male with right shoulder pain and swelling.  RIGHT SHOULDER - 2+ VIEW  Comparison: 10/28/2009 chest radiograph  Findings: There is no evidence of acute bony abnormality. There is no evidence of acute  fracture, subluxation, or dislocation. No focal bony lesions are identified. The visualized right hemithorax is unremarkable.  Moderate degenerative changes at the [facility] joint noted. Decreased acromiohumeral space may represent a chronic rotator cuff tear.  IMPRESSION:  No evidence of acute bony abnormality.  Moderate AC joint degenerative changes and possible chronic rotator cuff tear.   Original Report Authenticated By: Harmon Pier, M.D.    Dg Forearm Right  06/29/2012  *RADIOLOGY REPORT*  Clinical Data: Right forearm pain and swelling.  RIGHT FOREARM - 2 VIEW  Comparison: None  Findings: No evidence of acute fracture, subluxation or dislocation identified.  No radio-opaque foreign bodies are present.  No focal bony lesions are noted.  The joint spaces are unremarkable.  Vascular calcifications are noted.  IMPRESSION: No evidence of acute bony abnormality.   Original Report Authenticated By: Harmon Pier, M.D.    Ct Head Wo Contrast  07/01/2012  *RADIOLOGY REPORT*  Clinical Data: Right arm weakness.  Rule out stroke.  CT HEAD WITHOUT CONTRAST  Technique:  Contiguous axial images were obtained from the base of the skull through the vertex without contrast.  Comparison: None.  Findings: Mild atrophy.  Negative for acute infarct.  Negative for hemorrhage or mass lesion.  No fluid collection is present. Calvarium is intact.  IMPRESSION: Mild atrophy.  No acute abnormality.   Original Report Authenticated By: Janeece Riggers, M.D.    Mr [facility] Wo Contrast  07/04/2012  *RADIOLOGY REPORT*  Clinical Data:  62 year old male with slurred speech, difficulty moving right shoulder.  Recent left lower extremity amputation.  Comparison:  Head CT 07/01/2012.  MRI HEAD WITHOUT CONTRAST MRI CERVICAL SPINE WITHOUT CONTRAST  Technique:  Multiplanar, multiecho pulse sequences of the brain and surrounding structures, and cervical spine, to include the craniocervical junction and cervicothoracic junction, were obtained without intravenous  contrast.  MRI HEAD  Findings:  Punctate cortical focus of restricted diffusion in the left parietal lobe, posterior to the sensory strip (series 5 image 23).  No associated T2 or FLAIR hyperintensity.  Possible additional punctate area of restriction in the left occipital lobe white matter (image 13).  There is a small focus of restricted diffusion along the anterior right cerebellar tonsil (series 5 image 5).  There are to other punctate areas of restricted diffusion in the right cerebellum, one involving the right middle cerebellar peduncle (series 5 image 8). Probably also one additional punctate area of restriction in the dorsal medulla also on this image.  Minimal associated T2 and FLAIR hyperintensity.  Diffusion elsewhere is within normal limits. No acute intracranial hemorrhage identified.  No midline shift, mass effect, or evidence of mass lesion.  No ventriculomegaly. Major intracranial vascular flow voids are preserved, MRA findings are below.  Outside of the above findings, there is only mild for age scattered cerebral white matter T2 and FLAIR hyperintensity, mostly subcortical.  Negative pituitary cervicomedullary junction. Cervical spine findings are below.  Visualized orbit soft tissues are within normal limits.  Trace mastoid effusions.  Negative visualized nasopharynx. Mild maxillary and ethmoid sinus mucosal thickening.  Negative scalp soft tissues.  IMPRESSION: 1. 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  Two punctate acute infarcts suspected also in the left occipital and parietal lobe. No mass effect or hemorrhage. Given the distribution, embolic phenomena to the posterior circulation is possible.  The supratentorial findings might also reflect synchronous small vessel disease. 2.  No other acute intracranial abnormality and otherwise largely unremarkable for age MRI appearance of the brain.  3.  Cervical spine and MRA findings are below.  MRI CERVICAL SPINE   Findings: Preserved cervical lordosis. No marrow edema or evidence of acute osseous abnormality.  Subcutaneous edema in the posterior paraspinal soft tissues, possible mild involvement of the superficial aspect of the underlying erector spinae muscles.  This may be related to prolonged bedrest, the conditioning. Other Visualized paraspinal soft tissues are within normal limits.  Cervicomedullary junction is within normal limits.  Despite spinal stenosis, described below, no cervical spinal cord signal abnormality.  C2-C3:  Negative.  C3-C4:  Mild circumferential disc bulge.  Mild uncovertebral hypertrophy.  Mild bilateral C4 foraminal stenosis.  C4-C5:  Circumferential disc osteophyte complex.  Broad-based central right paracentral posterior component of disc.  Spinal stenosis with mild spinal cord flattening.  Mild left C5 foraminal stenosis.  C5-C6:  Circumferential disc osteophyte complex.  Moderate ligament flavum hypertrophy.  Broad-based posterior component of disc. Spinal stenosis with mild spinal cord flattening.  Mild to moderate bilateral C6 foraminal stenosis.  C6-C7:  Circumferential disc osteophyte complex.  Broad-based left eccentric posterior component of disc.  Moderate ligament flavum hypertrophy.  Spinal stenosis with minimal spinal cord mass effect. Moderate to severe left and mild right C7 foraminal stenosis.  C7-T1:  Mild facet hypertrophy.  Mild left greater than right C8 foraminal stenosis.  Negative visualized upper thoracic levels.  IMPRESSION: 1.  Multifactorial cervical spinal stenosis with mild spinal cord mass effect from C4-C5  to C6-C7.  No spinal cord signal abnormality. 2.  Moderate to severe left C7 foraminal stenosis related to disc and endplate changes. Mild to moderate multifactorial bilateral C6 foraminal stenosis. 3.  MRA findings are below.  MRA HEAD WITHOUT CONTRAST  Technique:  Angiographic images of the Circle of Willis were obtained using MRA technique without intravenous  contrast.  Findings:  Antegrade flow in the posterior circulation.  Codominant distal vertebral arteries.  Normal right PICA.  Normal vertebrobasilar junction.  Prominent right AICA also noted.  No left PICA or AICA origin identified. Subsequently, the left SCA might be dominant.  No basilar stenosis.  SCA and PCA origins are normal.  Diminutive posterior communicating arteries.  Bilateral PCA branches are within normal limits.  Antegrade flow in both ICA siphons.  Mild supraclinoid ICA irregularity.  No focal ICA stenosis.  Patent carotid termini.  MCA and ACA origins are within normal limits.  Diminutive or absent anterior communicating artery.  Visualized ACA branches are within normal limits.  Visualized bilateral MCA branches are within normal limits.  IMPRESSION: Negative intracranial MRA.   Original Report Authenticated By: Erskine Speed, M.D.    Mr Brain Wo Contrast  07/04/2012  *RADIOLOGY REPORT*  Clinical Data:  62 year old male with slurred speech, difficulty moving right shoulder.  Recent left lower extremity amputation.  Comparison:  Head CT 07/01/2012.  MRI HEAD WITHOUT CONTRAST MRI CERVICAL SPINE WITHOUT CONTRAST  Technique:  Multiplanar, multiecho pulse sequences of the brain and surrounding structures, and cervical spine, to include the craniocervical junction and cervicothoracic junction, were obtained without intravenous contrast.  MRI HEAD  Findings:  Punctate cortical focus of restricted diffusion in the left parietal lobe, posterior to the sensory strip (series 5 image 23).  No associated T2 or FLAIR hyperintensity.  Possible additional punctate area of restriction in the left occipital lobe white matter (image 13).  There is a small focus of restricted diffusion along the anterior right cerebellar tonsil (series 5 image 5).  There are to other punctate areas of restricted diffusion in the right cerebellum, one involving the right middle cerebellar peduncle (series 5 image 8). Probably also one  additional punctate area of restriction in the dorsal medulla also on this image.  Minimal associated T2 and FLAIR hyperintensity.  Diffusion elsewhere is within normal limits. No acute intracranial hemorrhage identified.  No midline shift, mass effect, or evidence of mass lesion.  No ventriculomegaly. Major intracranial vascular flow voids are preserved, MRA findings are below.  Outside of the above findings, there is only mild for age scattered cerebral white matter T2 and FLAIR hyperintensity, mostly subcortical.  Negative pituitary cervicomedullary junction. Cervical spine findings are below.  Visualized orbit soft tissues are within normal limits.  Trace mastoid effusions.  Negative visualized nasopharynx. Mild maxillary and ethmoid sinus mucosal thickening.  Negative scalp soft tissues.  IMPRESSION: 1. 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  Two punctate acute infarcts suspected also in the left occipital and parietal lobe. No mass effect or hemorrhage. Given the distribution, embolic phenomena to the posterior circulation is possible.  The supratentorial findings might also reflect synchronous small vessel disease. 2.  No other acute intracranial abnormality and otherwise largely unremarkable for age MRI appearance of the brain.  3.  Cervical spine and MRA findings are below.  MRI CERVICAL SPINE  Findings: Preserved cervical lordosis. No marrow edema or evidence of acute osseous abnormality.  Subcutaneous edema in the posterior paraspinal soft tissues, possible mild  involvement of the superficial aspect of the underlying erector spinae muscles.  This may be related to prolonged bedrest, the conditioning. Other Visualized paraspinal soft tissues are within normal limits.  Cervicomedullary junction is within normal limits.  Despite spinal stenosis, described below, no cervical spinal cord signal abnormality.  C2-C3:  Negative.  C3-C4:  Mild circumferential disc bulge.  Mild  uncovertebral hypertrophy.  Mild bilateral C4 foraminal stenosis.  C4-C5:  Circumferential disc osteophyte complex.  Broad-based central right paracentral posterior component of disc.  Spinal stenosis with mild spinal cord flattening.  Mild left C5 foraminal stenosis.  C5-C6:  Circumferential disc osteophyte complex.  Moderate ligament flavum hypertrophy.  Broad-based posterior component of disc. Spinal stenosis with mild spinal cord flattening.  Mild to moderate bilateral C6 foraminal stenosis.  C6-C7:  Circumferential disc osteophyte complex.  Broad-based left eccentric posterior component of disc.  Moderate ligament flavum hypertrophy.  Spinal stenosis with minimal spinal cord mass effect. Moderate to severe left and mild right C7 foraminal stenosis.  C7-T1:  Mild facet hypertrophy.  Mild left greater than right C8 foraminal stenosis.  Negative visualized upper thoracic levels.  IMPRESSION: 1.  Multifactorial cervical spinal stenosis with mild spinal cord mass effect from C4-C5 to C6-C7.  No spinal cord signal abnormality. 2.  Moderate to severe left C7 foraminal stenosis related to disc and endplate changes. Mild to moderate multifactorial bilateral C6 foraminal stenosis. 3.  MRA findings are below.  MRA HEAD WITHOUT CONTRAST  Technique:  Angiographic images of the Circle of Willis were obtained using MRA technique without intravenous contrast.  Findings:  Antegrade flow in the posterior circulation.  Codominant distal vertebral arteries.  Normal right PICA.  Normal vertebrobasilar junction.  Prominent right AICA also noted.  No left PICA or AICA origin identified. Subsequently, the left SCA might be dominant.  No basilar stenosis.  SCA and PCA origins are normal.  Diminutive posterior communicating arteries.  Bilateral PCA branches are within normal limits.  Antegrade flow in both ICA siphons.  Mild supraclinoid ICA irregularity.  No focal ICA stenosis.  Patent carotid termini.  MCA and ACA origins are within  normal limits.  Diminutive or absent anterior communicating artery.  Visualized ACA branches are within normal limits.  Visualized bilateral MCA branches are within normal limits.  IMPRESSION: Negative intracranial MRA.   Original Report Authenticated By: Erskine Speed, M.D.    Mr Cervical Spine Wo Contrast  07/04/2012  *RADIOLOGY REPORT*  Clinical Data:  62 year old male with slurred speech, difficulty moving right shoulder.  Recent left lower extremity amputation.  Comparison:  Head CT 07/01/2012.  MRI HEAD WITHOUT CONTRAST MRI CERVICAL SPINE WITHOUT CONTRAST  Technique:  Multiplanar, multiecho pulse sequences of the brain and surrounding structures, and cervical spine, to include the craniocervical junction and cervicothoracic junction, were obtained without intravenous contrast.  MRI HEAD  Findings:  Punctate cortical focus of restricted diffusion in the left parietal lobe, posterior to the sensory strip (series 5 image 23).  No associated T2 or FLAIR hyperintensity.  Possible additional punctate area of restriction in the left occipital lobe white matter (image 13).  There is a small focus of restricted diffusion along the anterior right cerebellar tonsil (series 5 image 5).  There are to other punctate areas of restricted diffusion in the right cerebellum, one involving the right middle cerebellar peduncle (series 5 image 8). Probably also one additional punctate area of restriction in the dorsal medulla also on this image.  Minimal associated T2 and FLAIR hyperintensity.  Diffusion elsewhere is within normal limits. No acute intracranial hemorrhage identified.  No midline shift, mass effect, or evidence of mass lesion.  No ventriculomegaly. Major intracranial vascular flow voids are preserved, MRA findings are below.  Outside of the above findings, there is only mild for age scattered cerebral white matter T2 and FLAIR hyperintensity, mostly subcortical.  Negative pituitary cervicomedullary junction. Cervical  spine findings are below.  Visualized orbit soft tissues are within normal limits.  Trace mastoid effusions.  Negative visualized nasopharynx. Mild maxillary and ethmoid sinus mucosal thickening.  Negative scalp soft tissues.  IMPRESSION: 1. 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  Two punctate acute infarcts suspected also in the left occipital and parietal lobe. No mass effect or hemorrhage. Given the distribution, embolic phenomena to the posterior circulation is possible.  The supratentorial findings might also reflect synchronous small vessel disease. 2.  No other acute intracranial abnormality and otherwise largely unremarkable for age MRI appearance of the brain.  3.  Cervical spine and MRA findings are below.  MRI CERVICAL SPINE  Findings: Preserved cervical lordosis. No marrow edema or evidence of acute osseous abnormality.  Subcutaneous edema in the posterior paraspinal soft tissues, possible mild involvement of the superficial aspect of the underlying erector spinae muscles.  This may be related to prolonged bedrest, the conditioning. Other Visualized paraspinal soft tissues are within normal limits.  Cervicomedullary junction is within normal limits.  Despite spinal stenosis, described below, no cervical spinal cord signal abnormality.  C2-C3:  Negative.  C3-C4:  Mild circumferential disc bulge.  Mild uncovertebral hypertrophy.  Mild bilateral C4 foraminal stenosis.  C4-C5:  Circumferential disc osteophyte complex.  Broad-based central right paracentral posterior component of disc.  Spinal stenosis with mild spinal cord flattening.  Mild left C5 foraminal stenosis.  C5-C6:  Circumferential disc osteophyte complex.  Moderate ligament flavum hypertrophy.  Broad-based posterior component of disc. Spinal stenosis with mild spinal cord flattening.  Mild to moderate bilateral C6 foraminal stenosis.  C6-C7:  Circumferential disc osteophyte complex.  Broad-based left eccentric  posterior component of disc.  Moderate ligament flavum hypertrophy.  Spinal stenosis with minimal spinal cord mass effect. Moderate to severe left and mild right C7 foraminal stenosis.  C7-T1:  Mild facet hypertrophy.  Mild left greater than right C8 foraminal stenosis.  Negative visualized upper thoracic levels.  IMPRESSION: 1.  Multifactorial cervical spinal stenosis with mild spinal cord mass effect from C4-C5 to C6-C7.  No spinal cord signal abnormality. 2.  Moderate to severe left C7 foraminal stenosis related to disc and endplate changes. Mild to moderate multifactorial bilateral C6 foraminal stenosis. 3.  MRA findings are below.  MRA HEAD WITHOUT CONTRAST  Technique:  Angiographic images of the Circle of Willis were obtained using MRA technique without intravenous contrast.  Findings:  Antegrade flow in the posterior circulation.  Codominant distal vertebral arteries.  Normal right PICA.  Normal vertebrobasilar junction.  Prominent right AICA also noted.  No left PICA or AICA origin identified. Subsequently, the left SCA might be dominant.  No basilar stenosis.  SCA and PCA origins are normal.  Diminutive posterior communicating arteries.  Bilateral PCA branches are within normal limits.  Antegrade flow in both ICA siphons.  Mild supraclinoid ICA irregularity.  No focal ICA stenosis.  Patent carotid termini.  MCA and ACA origins are within normal limits.  Diminutive or absent anterior communicating artery.  Visualized ACA branches are within normal limits.  Visualized bilateral MCA branches are within normal  limits.  IMPRESSION: Negative intracranial MRA.   Original Report Authenticated By: Erskine Speed, M.D.    Dg Chest Port 1 View  07/02/2012  *RADIOLOGY REPORT*  Clinical Data: Advancement of endotracheal tube  PORTABLE CHEST - 1 VIEW  Comparison: Portable exam 0626 hours compared to 07/02/2012 at 0514 hours  Findings: Tip of endotracheal tube now 2.0 cm above carina. Nasogastric tube and right jugular  line unchanged. Stable heart size. Persistent atelectasis versus consolidation left lower lobe. Mild right basilar atelectasis. Upper lungs clear.  IMPRESSION: Tip of endotracheal tube now 2.0 cm above carina. Remainder exam unchanged.   Original Report Authenticated By: Ulyses Southward, M.D.    Dg Chest Port 1 View  07/02/2012  *RADIOLOGY REPORT*  Clinical Data: Follow up atelectasis  PORTABLE CHEST - 1 VIEW  Comparison: Portable exam 0514 hours compared to 07/01/2012  Findings: Tip of endotracheal tube in cervical region, 11.8 cm above carina. Right jugular central venous catheter tip projecting over SVC. Nasogastric tube extends into stomach. Numerous cardiac monitoring leads project over chest. Upper-normal size of cardiac silhouette. Mediastinal contours and pulmonary vascularity normal. Minimal right basilar atelectasis. Slightly increased atelectasis versus consolidation in medial left lower lobe. No definite pleural effusion or pneumothorax.  IMPRESSION: Minimal right basilar atelectasis with increased atelectasis versus consolidation in left lower lobe. High position of endotracheal tube 11.8 cm above carina; may consider advancing tube 6 cm for more stable position within thoracic trachea.   Original Report Authenticated By: Ulyses Southward, M.D.    Dg Chest Port 1 View  07/01/2012  *RADIOLOGY REPORT*  Clinical Data: Acute respiratory failure on ventilator.  Colon carcinoma.  PORTABLE CHEST - 1 VIEW  Comparison: 06/30/2012  Findings: Endotracheal tube remains high in position with the tip approximately 11 cm above the carina.  Both lungs are clear.  Heart size is normal.  Right jugular center venous catheter remains in appropriate position.  IMPRESSION:  1.  High endotracheal tube position, with tip approximately 11 cm above carina. 2.  No active lung disease.   Original Report Authenticated By: Myles Rosenthal, M.D.    Dg Chest Port 1 View  06/30/2012  *RADIOLOGY REPORT*  Clinical Data: Endotracheal placement   PORTABLE CHEST - 1 VIEW  Comparison: Same day  Findings: Endotracheal tip is 9 cm above the carina, at the thoracic inlet.  Right internal jugular central line is in the SVC at the azygos level.  Minimal atelectasis again noted in the left lower lobe.  No other change.  IMPRESSION: Endotracheal tube somewhat high, 9 cm above the carina at the thoracic inlet.   Original Report Authenticated By: Paulina Fusi, M.D.    Dg Chest Portable 1 View  06/30/2012  *RADIOLOGY REPORT*  Clinical Data: Central line placement.  PORTABLE CHEST - 1 VIEW  Comparison: Chest radiograph performed 06/29/2012  Findings: The lungs are well-aerated.  Mild right basilar opacity may reflect atelectasis or possibly pneumonia.  There is no evidence of pleural effusion or pneumothorax.  The cardiomediastinal silhouette is within normal limits.  No acute osseous abnormalities are seen.  A right IJ line is noted ending about the mid SVC.  IMPRESSION:  1.  Right IJ line noted ending about the mid SVC. 2.  Mild right basilar airspace opacity may reflect atelectasis or possibly pneumonia.   Original Report Authenticated By: Tonia Ghent, M.D.    Dg Foot Complete Left  06/29/2012  *RADIOLOGY REPORT*  Clinical Data: 62 year old male with left foot pain and wounds on left  foot.  LEFT FOOT - COMPLETE 3+ VIEW  Comparison: None  Findings: Soft tissue gas and swelling is identified at the level of the lateral MTP joints. There is no evidence of acute fracture, subluxation or dislocation. The Lisfranc joints are intact. No focal bony lesions are present except for a large plantar calcaneal spur. Vascular calcifications are present.  IMPRESSION: Soft tissue gas and swelling overlying the distal foot suspicious either for open wound or gas forming infection.  No radiographic evidence of osteomyelitis.  No acute bony abnormalities identified.  Calcaneal spur and vascular calcifications.   Original Report Authenticated By: Harmon Pier, M.D.     Scheduled  Meds:   . antiseptic oral rinse  15 mL Mouth Rinse BID  . cefTRIAXone (ROCEPHIN)  IV  2 g Intravenous Q24H  . diphenoxylate-atropine  10 mL Oral QID  . feeding supplement  237 mL Oral BID BM  . feeding supplement  30 mL Oral TID WC  . furosemide  80 mg Intravenous Q8H  . heparin subcutaneous  5,000 Units Subcutaneous Q8H  . insulin aspart  0-15 Units Subcutaneous TID WC  . insulin glargine  10 Units Subcutaneous Daily  . saccharomyces boulardii  250 mg Oral BID  . sodium chloride  3 mL Intravenous Q12H   Continuous Infusions:   . sodium chloride Stopped (07/01/12 1600)    ________________________________________________________________________  Time spent: 40 min    [facility]  [facility] Pager [phone] If 8PM-8AM, please contact night-coverage at [email or web address], password [facility] 07/04/2012, 6:17 PM  LOS: 5 days

## 2012-07-05 NOTE — Progress Notes (Signed)
INFECTIOUS DISEASE PROGRESS NOTE  ID: Tyler Erickson is a 62 y.o. male with   Principal Problem:  *Gangrene of foot Active Problems:  ADENOCARCINOMA, COLON  DM  NEUROPATHY  Hyponatremia  Hypokalemia  Metabolic acidosis  AKI (acute kidney injury)  Leukocytosis  Diabetic foot ulcer  Decreased range of motion of shoulder  Septic shock(785.52)  Severe protein-calorie malnutrition  Subjective: Resting quietly, awakens transiently.   Abtx:  Anti-infectives     Start     Dose/Rate Route Frequency Ordered Stop   07/03/12 1600   cefTRIAXone (ROCEPHIN) 2 g in dextrose 5 % 50 mL IVPB        2 g 100 mL/hr over 30 Minutes Intravenous Every 24 hours 07/03/12 1409 07/13/12 1559   06/30/12 2000   cefUROXime (ZINACEF) 1.5 g in dextrose 5 % 50 mL IVPB  Status:  Discontinued        1.5 g 100 mL/hr over 30 Minutes Intravenous Every 12 hours 06/30/12 1954 06/30/12 2001   06/30/12 0645   piperacillin-tazobactam (ZOSYN) IVPB 3.375 g  Status:  Discontinued        3.375 g 100 mL/hr over 30 Minutes Intravenous  Once 06/30/12 0630 06/30/12 0633   06/30/12 0400   vancomycin (VANCOCIN) 500 mg in sodium chloride 0.9 % 100 mL IVPB  Status:  Discontinued        500 mg 100 mL/hr over 60 Minutes Intravenous Every 12 hours 06/29/12 1912 07/01/12 1207   06/29/12 2200   piperacillin-tazobactam (ZOSYN) IVPB 3.375 g  Status:  Discontinued        3.375 g 12.5 mL/hr over 240 Minutes Intravenous 3 times per day 06/29/12 1912 07/03/12 1409   06/29/12 1900   clindamycin (CLEOCIN) IVPB 600 mg  Status:  Discontinued        600 mg 100 mL/hr over 30 Minutes Intravenous 4 times per day 06/29/12 1847 07/03/12 1409   06/29/12 1500   vancomycin (VANCOCIN) IVPB 1000 mg/200 mL premix        1,000 mg 200 mL/hr over 60 Minutes Intravenous  Once 06/29/12 1454 06/29/12 1752   06/29/12 1500   piperacillin-tazobactam (ZOSYN) IVPB 4.5 g  Status:  Discontinued        4.5 g 200 mL/hr over 30 Minutes Intravenous  Once  06/29/12 1454 06/29/12 1459   06/29/12 1500  piperacillin-tazobactam (ZOSYN) IVPB 3.375 g       3.375 g 100 mL/hr over 30 Minutes Intravenous  Once 06/29/12 1459 06/29/12 1752          Medications:  Scheduled:   . antiseptic oral rinse  15 mL Mouth Rinse BID  . aspirin  325 mg Oral Daily  . cefTRIAXone (ROCEPHIN)  IV  2 g Intravenous Q24H  . diphenoxylate-atropine  10 mL Oral QID  . feeding supplement  237 mL Oral BID BM  . feeding supplement  30 mL Oral TID WC  . heparin subcutaneous  5,000 Units Subcutaneous Q8H  . insulin aspart  0-15 Units Subcutaneous TID WC  . insulin glargine  10 Units Subcutaneous Daily  . saccharomyces boulardii  250 mg Oral BID  . sodium chloride  3 mL Intravenous Q12H    Objective: Vital signs in last 24 hours: Temp:  [97.3 F (36.3 C)-98.8 F (37.1 C)] 97.3 F (36.3 C) (01/08 1200) Pulse Rate:  [83-91] 83  (01/08 0805) Resp:  [16-67] 22  (01/08 1140) BP: (114-148)/(56-79) 138/73 mmHg (01/08 1140) SpO2:  [95 %-100 %] 96 % (01/08  1140)   General appearance: no distress Neck: R IJ Resp: clear to auscultation bilaterally Cardio: regular rate and rhythm GI: normal findings: bowel sounds normal and soft, non-tender  Lab Results  Basename 07/05/12 0415 07/03/12 1600 07/03/12 0717  WBC 8.7 -- 15.7*  HGB 9.6* -- 9.4*  HCT 27.8* -- 26.9*  NA 137 141 --  K 2.8* 3.6 --  CL 97 107 --  CO2 30 23 --  BUN 62* 69* --  CREATININE 1.93* 2.12* --  GLU -- -- --   Liver Panel  Basename 07/03/12 0719  PROT 5.9*  ALBUMIN 0.9*  AST 73*  ALT 31  ALKPHOS 152*  BILITOT 2.6*  BILIDIR --  IBILI --   Sedimentation Rate No results found for this basename: ESRSEDRATE in the last 72 hours C-Reactive Protein No results found for this basename: CRP:2 in the last 72 hours  Microbiology: Recent Results (from the past 240 hour(s))  CULTURE, BLOOD (ROUTINE X 2)     Status: Normal   Collection Time   06/29/12  2:15 PM      Component Value Range Status  Comment   Specimen Description BLOOD RIGHT HAND   Final    Special Requests BOTTLES DRAWN AEROBIC AND ANAEROBIC 4CC EACH   Final    Culture  Setup Time 06/29/2012 21:41   Final    Culture     Final    Value: GROUP A STREP (S.PYOGENES) ISOLATED     Note: CRITICAL RESULT CALLED TO, READ BACK BY AND VERIFIED WITH: CHRIS ARMITAGE 07/01/12 @ 10:45AM BY RUSCA.     Note: Gram Stain Report Called to,Read Back By and Verified With: TERESA Antony Salmon 06/30/12 1040 BY SMITHERSJ   Report Status 07/01/2012 FINAL   Final   CULTURE, BLOOD (ROUTINE X 2)     Status: Normal   Collection Time   06/29/12  2:20 PM      Component Value Range Status Comment   Specimen Description BLOOD RIGHT THUMB   Final    Special Requests BOTTLES DRAWN AEROBIC ONLY 3CC   Final    Culture  Setup Time 06/29/2012 21:40   Final    Culture     Final    Value: GROUP A STREP (S.PYOGENES) ISOLATED     Note: CRITICAL RESULT CALLED TO, READ BACK BY AND VERIFIED WITH: CHRIS ARMITAGE 07/01/12 @ 10:45AM BY RUSCA.     Note: PAM ALVORD 06/30/12 1135 BY SMITHERSJ   Report Status 07/01/2012 FINAL   Final   URINE CULTURE     Status: Normal   Collection Time   06/29/12  4:01 PM      Component Value Range Status Comment   Specimen Description URINE, CLEAN CATCH   Final    Special Requests NONE   Final    Culture  Setup Time 06/30/2012 02:24   Final    Colony Count NO GROWTH   Final    Culture NO GROWTH   Final    Report Status 07/01/2012 FINAL   Final   MRSA PCR SCREENING     Status: Normal   Collection Time   06/29/12  8:56 PM      Component Value Range Status Comment   MRSA by PCR NEGATIVE  NEGATIVE Final   SURGICAL PCR SCREEN     Status: Normal   Collection Time   06/30/12 10:08 AM      Component Value Range Status Comment   MRSA, PCR NEGATIVE  NEGATIVE Final  Staphylococcus aureus NEGATIVE  NEGATIVE Final   CULTURE, BLOOD (ROUTINE X 2)     Status: Normal (Preliminary result)   Collection Time   07/03/12  6:06 PM      Component Value Range  Status Comment   Specimen Description BLOOD LEFT THUMB   Final    Special Requests BOTTLES DRAWN AEROBIC ONLY 2CC   Final    Culture  Setup Time 07/04/2012 01:53   Final    Culture     Final    Value:        BLOOD CULTURE RECEIVED NO GROWTH TO DATE CULTURE WILL BE HELD FOR 5 DAYS BEFORE ISSUING A FINAL NEGATIVE REPORT   Report Status PENDING   Incomplete   CLOSTRIDIUM DIFFICILE BY PCR     Status: Normal   Collection Time   07/04/12  4:00 AM      Component Value Range Status Comment   C difficile by pcr NEGATIVE  NEGATIVE Final     Studies/Results: Mr Shirlee Latch Wo Contrast  07/04/2012  *RADIOLOGY REPORT*  Clinical Data:  62 year old male with slurred speech, difficulty moving right shoulder.  Recent left lower extremity amputation.  Comparison:  Head CT 07/01/2012.  MRI HEAD WITHOUT CONTRAST MRI CERVICAL SPINE WITHOUT CONTRAST  Technique:  Multiplanar, multiecho pulse sequences of the brain and surrounding structures, and cervical spine, to include the craniocervical junction and cervicothoracic junction, were obtained without intravenous contrast.  MRI HEAD  Findings:  Punctate cortical focus of restricted diffusion in the left parietal lobe, posterior to the sensory strip (series 5 image 23).  No associated T2 or FLAIR hyperintensity.  Possible additional punctate area of restriction in the left occipital lobe white matter (image 13).  There is a small focus of restricted diffusion along the anterior right cerebellar tonsil (series 5 image 5).  There are to other punctate areas of restricted diffusion in the right cerebellum, one involving the right middle cerebellar peduncle (series 5 image 8). Probably also one additional punctate area of restriction in the dorsal medulla also on this image.  Minimal associated T2 and FLAIR hyperintensity.  Diffusion elsewhere is within normal limits. No acute intracranial hemorrhage identified.  No midline shift, mass effect, or evidence of mass lesion.  No  ventriculomegaly. Major intracranial vascular flow voids are preserved, MRA findings are below.  Outside of the above findings, there is only mild for age scattered cerebral white matter T2 and FLAIR hyperintensity, mostly subcortical.  Negative pituitary cervicomedullary junction. Cervical spine findings are below.  Visualized orbit soft tissues are within normal limits.  Trace mastoid effusions.  Negative visualized nasopharynx. Mild maxillary and ethmoid sinus mucosal thickening.  Negative scalp soft tissues.  IMPRESSION: 1. 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  Two punctate acute infarcts suspected also in the left occipital and parietal lobe. No mass effect or hemorrhage. Given the distribution, embolic phenomena to the posterior circulation is possible.  The supratentorial findings might also reflect synchronous small vessel disease. 2.  No other acute intracranial abnormality and otherwise largely unremarkable for age MRI appearance of the brain.  3.  Cervical spine and MRA findings are below.  MRI CERVICAL SPINE  Findings: Preserved cervical lordosis. No marrow edema or evidence of acute osseous abnormality.  Subcutaneous edema in the posterior paraspinal soft tissues, possible mild involvement of the superficial aspect of the underlying erector spinae muscles.  This may be related to prolonged bedrest, the conditioning. Other Visualized paraspinal soft tissues are  within normal limits.  Cervicomedullary junction is within normal limits.  Despite spinal stenosis, described below, no cervical spinal cord signal abnormality.  C2-C3:  Negative.  C3-C4:  Mild circumferential disc bulge.  Mild uncovertebral hypertrophy.  Mild bilateral C4 foraminal stenosis.  C4-C5:  Circumferential disc osteophyte complex.  Broad-based central right paracentral posterior component of disc.  Spinal stenosis with mild spinal cord flattening.  Mild left C5 foraminal stenosis.  C5-C6:  Circumferential  disc osteophyte complex.  Moderate ligament flavum hypertrophy.  Broad-based posterior component of disc. Spinal stenosis with mild spinal cord flattening.  Mild to moderate bilateral C6 foraminal stenosis.  C6-C7:  Circumferential disc osteophyte complex.  Broad-based left eccentric posterior component of disc.  Moderate ligament flavum hypertrophy.  Spinal stenosis with minimal spinal cord mass effect. Moderate to severe left and mild right C7 foraminal stenosis.  C7-T1:  Mild facet hypertrophy.  Mild left greater than right C8 foraminal stenosis.  Negative visualized upper thoracic levels.  IMPRESSION: 1.  Multifactorial cervical spinal stenosis with mild spinal cord mass effect from C4-C5 to C6-C7.  No spinal cord signal abnormality. 2.  Moderate to severe left C7 foraminal stenosis related to disc and endplate changes. Mild to moderate multifactorial bilateral C6 foraminal stenosis. 3.  MRA findings are below.  MRA HEAD WITHOUT CONTRAST  Technique:  Angiographic images of the Circle of Willis were obtained using MRA technique without intravenous contrast.  Findings:  Antegrade flow in the posterior circulation.  Codominant distal vertebral arteries.  Normal right PICA.  Normal vertebrobasilar junction.  Prominent right AICA also noted.  No left PICA or AICA origin identified. Subsequently, the left SCA might be dominant.  No basilar stenosis.  SCA and PCA origins are normal.  Diminutive posterior communicating arteries.  Bilateral PCA branches are within normal limits.  Antegrade flow in both ICA siphons.  Mild supraclinoid ICA irregularity.  No focal ICA stenosis.  Patent carotid termini.  MCA and ACA origins are within normal limits.  Diminutive or absent anterior communicating artery.  Visualized ACA branches are within normal limits.  Visualized bilateral MCA branches are within normal limits.  IMPRESSION: Negative intracranial MRA.   Original Report Authenticated By: Erskine Speed, M.D.    Mr Brain Wo  Contrast  07/04/2012  *RADIOLOGY REPORT*  Clinical Data:  62 year old male with slurred speech, difficulty moving right shoulder.  Recent left lower extremity amputation.  Comparison:  Head CT 07/01/2012.  MRI HEAD WITHOUT CONTRAST MRI CERVICAL SPINE WITHOUT CONTRAST  Technique:  Multiplanar, multiecho pulse sequences of the brain and surrounding structures, and cervical spine, to include the craniocervical junction and cervicothoracic junction, were obtained without intravenous contrast.  MRI HEAD  Findings:  Punctate cortical focus of restricted diffusion in the left parietal lobe, posterior to the sensory strip (series 5 image 23).  No associated T2 or FLAIR hyperintensity.  Possible additional punctate area of restriction in the left occipital lobe white matter (image 13).  There is a small focus of restricted diffusion along the anterior right cerebellar tonsil (series 5 image 5).  There are to other punctate areas of restricted diffusion in the right cerebellum, one involving the right middle cerebellar peduncle (series 5 image 8). Probably also one additional punctate area of restriction in the dorsal medulla also on this image.  Minimal associated T2 and FLAIR hyperintensity.  Diffusion elsewhere is within normal limits. No acute intracranial hemorrhage identified.  No midline shift, mass effect, or evidence of mass lesion.  No ventriculomegaly. Major intracranial  vascular flow voids are preserved, MRA findings are below.  Outside of the above findings, there is only mild for age scattered cerebral white matter T2 and FLAIR hyperintensity, mostly subcortical.  Negative pituitary cervicomedullary junction. Cervical spine findings are below.  Visualized orbit soft tissues are within normal limits.  Trace mastoid effusions.  Negative visualized nasopharynx. Mild maxillary and ethmoid sinus mucosal thickening.  Negative scalp soft tissues.  IMPRESSION: 1. Small acute infarcts in the right cerebellum and dorsal  brain stem.  Two punctate acute infarcts suspected also in the left occipital and parietal lobe. No mass effect or hemorrhage. Given the distribution, embolic phenomena to the posterior circulation is possible.  The supratentorial findings might also reflect synchronous small vessel disease. 2.  No other acute intracranial abnormality and otherwise largely unremarkable for age MRI appearance of the brain.  3.  Cervical spine and MRA findings are below.  MRI CERVICAL SPINE  Findings: Preserved cervical lordosis. No marrow edema or evidence of acute osseous abnormality.  Subcutaneous edema in the posterior paraspinal soft tissues, possible mild involvement of the superficial aspect of the underlying erector spinae muscles.  This may be related to prolonged bedrest, the conditioning. Other Visualized paraspinal soft tissues are within normal limits.  Cervicomedullary junction is within normal limits.  Despite spinal stenosis, described below, no cervical spinal cord signal abnormality.  C2-C3:  Negative.  C3-C4:  Mild circumferential disc bulge.  Mild uncovertebral hypertrophy.  Mild bilateral C4 foraminal stenosis.  C4-C5:  Circumferential disc osteophyte complex.  Broad-based central right paracentral posterior component of disc.  Spinal stenosis with mild spinal cord flattening.  Mild left C5 foraminal stenosis.  C5-C6:  Circumferential disc osteophyte complex.  Moderate ligament flavum hypertrophy.  Broad-based posterior component of disc. Spinal stenosis with mild spinal cord flattening.  Mild to moderate bilateral C6 foraminal stenosis.  C6-C7:  Circumferential disc osteophyte complex.  Broad-based left eccentric posterior component of disc.  Moderate ligament flavum hypertrophy.  Spinal stenosis with minimal spinal cord mass effect. Moderate to severe left and mild right C7 foraminal stenosis.  C7-T1:  Mild facet hypertrophy.  Mild left greater than right C8 foraminal stenosis.  Negative visualized upper  thoracic levels.  IMPRESSION: 1.  Multifactorial cervical spinal stenosis with mild spinal cord mass effect from C4-C5 to C6-C7.  No spinal cord signal abnormality. 2.  Moderate to severe left C7 foraminal stenosis related to disc and endplate changes. Mild to moderate multifactorial bilateral C6 foraminal stenosis. 3.  MRA findings are below.  MRA HEAD WITHOUT CONTRAST  Technique:  Angiographic images of the Circle of Willis were obtained using MRA technique without intravenous contrast.  Findings:  Antegrade flow in the posterior circulation.  Codominant distal vertebral arteries.  Normal right PICA.  Normal vertebrobasilar junction.  Prominent right AICA also noted.  No left PICA or AICA origin identified. Subsequently, the left SCA might be dominant.  No basilar stenosis.  SCA and PCA origins are normal.  Diminutive posterior communicating arteries.  Bilateral PCA branches are within normal limits.  Antegrade flow in both ICA siphons.  Mild supraclinoid ICA irregularity.  No focal ICA stenosis.  Patent carotid termini.  MCA and ACA origins are within normal limits.  Diminutive or absent anterior communicating artery.  Visualized ACA branches are within normal limits.  Visualized bilateral MCA branches are within normal limits.  IMPRESSION: Negative intracranial MRA.   Original Report Authenticated By: Erskine Speed, M.D.    Mr Cervical Spine Wo Contrast  07/04/2012  *  RADIOLOGY REPORT*  Clinical Data:  62 year old male with slurred speech, difficulty moving right shoulder.  Recent left lower extremity amputation.  Comparison:  Head CT 07/01/2012.  MRI HEAD WITHOUT CONTRAST MRI CERVICAL SPINE WITHOUT CONTRAST  Technique:  Multiplanar, multiecho pulse sequences of the brain and surrounding structures, and cervical spine, to include the craniocervical junction and cervicothoracic junction, were obtained without intravenous contrast.  MRI HEAD  Findings:  Punctate cortical focus of restricted diffusion in the left  parietal lobe, posterior to the sensory strip (series 5 image 23).  No associated T2 or FLAIR hyperintensity.  Possible additional punctate area of restriction in the left occipital lobe white matter (image 13).  There is a small focus of restricted diffusion along the anterior right cerebellar tonsil (series 5 image 5).  There are to other punctate areas of restricted diffusion in the right cerebellum, one involving the right middle cerebellar peduncle (series 5 image 8). Probably also one additional punctate area of restriction in the dorsal medulla also on this image.  Minimal associated T2 and FLAIR hyperintensity.  Diffusion elsewhere is within normal limits. No acute intracranial hemorrhage identified.  No midline shift, mass effect, or evidence of mass lesion.  No ventriculomegaly. Major intracranial vascular flow voids are preserved, MRA findings are below.  Outside of the above findings, there is only mild for age scattered cerebral white matter T2 and FLAIR hyperintensity, mostly subcortical.  Negative pituitary cervicomedullary junction. Cervical spine findings are below.  Visualized orbit soft tissues are within normal limits.  Trace mastoid effusions.  Negative visualized nasopharynx. Mild maxillary and ethmoid sinus mucosal thickening.  Negative scalp soft tissues.  IMPRESSION: 1. 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  Two punctate acute infarcts suspected also in the left occipital and parietal lobe. No mass effect or hemorrhage. Given the distribution, embolic phenomena to the posterior circulation is possible.  The supratentorial findings might also reflect synchronous small vessel disease. 2.  No other acute intracranial abnormality and otherwise largely unremarkable for age MRI appearance of the brain.  3.  Cervical spine and MRA findings are below.  MRI CERVICAL SPINE  Findings: Preserved cervical lordosis. No marrow edema or evidence of acute osseous abnormality.   Subcutaneous edema in the posterior paraspinal soft tissues, possible mild involvement of the superficial aspect of the underlying erector spinae muscles.  This may be related to prolonged bedrest, the conditioning. Other Visualized paraspinal soft tissues are within normal limits.  Cervicomedullary junction is within normal limits.  Despite spinal stenosis, described below, no cervical spinal cord signal abnormality.  C2-C3:  Negative.  C3-C4:  Mild circumferential disc bulge.  Mild uncovertebral hypertrophy.  Mild bilateral C4 foraminal stenosis.  C4-C5:  Circumferential disc osteophyte complex.  Broad-based central right paracentral posterior component of disc.  Spinal stenosis with mild spinal cord flattening.  Mild left C5 foraminal stenosis.  C5-C6:  Circumferential disc osteophyte complex.  Moderate ligament flavum hypertrophy.  Broad-based posterior component of disc. Spinal stenosis with mild spinal cord flattening.  Mild to moderate bilateral C6 foraminal stenosis.  C6-C7:  Circumferential disc osteophyte complex.  Broad-based left eccentric posterior component of disc.  Moderate ligament flavum hypertrophy.  Spinal stenosis with minimal spinal cord mass effect. Moderate to severe left and mild right C7 foraminal stenosis.  C7-T1:  Mild facet hypertrophy.  Mild left greater than right C8 foraminal stenosis.  Negative visualized upper thoracic levels.  IMPRESSION: 1.  Multifactorial cervical spinal stenosis with mild spinal cord mass  effect from C4-C5 to C6-C7.  No spinal cord signal abnormality. 2.  Moderate to severe left C7 foraminal stenosis related to disc and endplate changes. Mild to moderate multifactorial bilateral C6 foraminal stenosis. 3.  MRA findings are below.  MRA HEAD WITHOUT CONTRAST  Technique:  Angiographic images of the Circle of Willis were obtained using MRA technique without intravenous contrast.  Findings:  Antegrade flow in the posterior circulation.  Codominant distal vertebral  arteries.  Normal right PICA.  Normal vertebrobasilar junction.  Prominent right AICA also noted.  No left PICA or AICA origin identified. Subsequently, the left SCA might be dominant.  No basilar stenosis.  SCA and PCA origins are normal.  Diminutive posterior communicating arteries.  Bilateral PCA branches are within normal limits.  Antegrade flow in both ICA siphons.  Mild supraclinoid ICA irregularity.  No focal ICA stenosis.  Patent carotid termini.  MCA and ACA origins are within normal limits.  Diminutive or absent anterior communicating artery.  Visualized ACA branches are within normal limits.  Visualized bilateral MCA branches are within normal limits.  IMPRESSION: Negative intracranial MRA.   Original Report Authenticated By: Erskine Speed, M.D.      Assessment/Plan:  Group A strep bacteremia  Gangrene s/p L BKA  DM  CRI Embolic CNS disease  Diarrhea  c diff (-)   TEE (-) Continue ceftriaxone at least 28 days due to his CNS findings  Repeat BCx 07-03-12 [facility] for [facility] Total days of antibiotics 7 (ceftriaxone)  Available if questions  Johny Sax Infectious Diseases [phone] 07/05/2012, 2:42 PM   LOS: 6 days

## 2012-07-05 NOTE — Progress Notes (Signed)
  Echocardiogram 2D Echocardiogram has been performed.  Meghin Thivierge, [facility] 07/05/2012, 11:58 AM

## 2012-07-05 NOTE — Progress Notes (Signed)
Orthopedic Tech Progress Note Patient Details:  Tyler Erickson September 23, 1950 [id number]  Patient ID: [name], male   DOB: [birth year], 62 y.o.   MRN: [id number]   [name] 07/05/2012, 9:28 AM LEFT BKA LIMBGRARD COMPLETED BY [facility].

## 2012-07-05 NOTE — Progress Notes (Signed)
Utilization review completed.  

## 2012-07-05 NOTE — Progress Notes (Addendum)
[facility] [place]  Postoperative Visit - Amputation  Date of Surgery: 06/29/2012 - 06/30/2012 Procedure(s): AMPUTATION BELOW KNEE INTRA OPERATIVE ARTERIOGRAM AORTOGRAM COMPARTMENT MEASUREMENT Left Surgeon: Surgeon(s): Sherren Kerns, MD Eulas Post, MD POD: 5 Days Post-Op  Subjective Tyler Erickson is a 62 y.o. male who is S/P Left Procedure(s): AMPUTATION BELOW KNEE INTRA OPERATIVE ARTERIOGRAM AORTOGRAM COMPARTMENT MEASUREMENT.  Pt.denies increased pain in the stump. The patient notes pain is well controlled. Pt. denies phantom pain.  Right hand continues to have burning type pain.  Significant Diagnostic Studies: CBC Lab Results  Component Value Date   WBC 8.7 07/05/2012   HGB 9.6* 07/05/2012   HCT 27.8* 07/05/2012   MCV 81.5 07/05/2012   PLT 86* 07/05/2012    BMET    Component Value Date/Time   NA 137 07/05/2012 0415   K 2.8* 07/05/2012 0415   CL 97 07/05/2012 0415   CO2 30 07/05/2012 0415   GLUCOSE 188* 07/05/2012 0415   BUN 62* 07/05/2012 0415   CREATININE 1.93* 07/05/2012 0415   CALCIUM 7.5* 07/05/2012 0415   GFRNONAA 36* 07/05/2012 0415   GFRAA 42* 07/05/2012 0415    COAG Lab Results  Component Value Date   INR 1.40 06/30/2012   INR 1.41 06/29/2012   INR 1.07 10/28/2009   No results found for this basename: PTT     Intake/Output Summary (Last 24 hours) at 07/05/12 0749 Last data filed at 07/05/12 0400  Gross per 24 hour  Intake    720 ml  Output   6300 ml  Net  -5580 ml   Patient Vitals for the past 24 hrs:  Stool Color  07/04/12 2200 Brown  07/04/12 2000 Brown     Physical Examination  BP Readings from Last 3 Encounters:  07/05/12 123/60  07/05/12 123/60  03/17/09 118/64   Temp Readings from Last 3 Encounters:  07/05/12 98.5 F (36.9 C) Axillary  07/05/12 98.5 F (36.9 C) Axillary   SpO2 Readings from Last 3 Encounters:  07/05/12 96%  07/05/12 96%   Pulse Readings from Last 3 Encounters:  07/05/12 89  07/05/12 89  03/17/09 72      Pt is A&Ox3  WDWN male with no complaints  Left amputation wound is healing well.  There is good bone coverage in the stump Stump is warm and well perfused, without drainage; without erythema   Assessment/plan:  Tyler Erickson is a 61 y.o. male who is s/p Left Procedure(s): AMPUTATION BELOW KNEE INTRA OPERATIVE ARTERIOGRAM AORTOGRAM COMPARTMENT MEASUREMENT  The patient's stump is viable.  Follow-up 4 weeks from surgery TEE to make sure that b/l embolic strokes are not due to endocarditis caused by septic emboli which is scheduled for AM.    Clinton Gallant [facility] 7:49 AM 07/05/2012 [phone]    BKA healing, right hand viable but still minimal function Creatinine trending down Evidence of stroke on MRI as well as some cervical root compression Neuro eval in progress Carotid duplex no ICA stenosis. No further recommendations from vascular standpoint. He has a shallow ulcer on the right foot which may need further evaluation at some point but too many other problems for now.  Will arrange follow up with me in 1 month.  Staples need to stay in until he sees me. Call if questions  Fabienne Bruns, MD [facility] [place] Office: [phone] Pager: [phone]

## 2012-07-05 NOTE — Progress Notes (Signed)
Inpatient Diabetes Program Recommendations  AACE/ADA: New Consensus Statement on Inpatient Glycemic Control (2013)  Target Ranges:  Prepandial:   less than 140 mg/dL      Peak postprandial:   less than 180 mg/dL (1-2 hours)      Critically ill patients:  140 - 180 mg/dL   Reason for Visit: Received referral for this patient from Hospital NP, Junious Silk.  A1c 10.9% (07/05/12).  Not taking any diabetes medication prior to admission.  Was having financial issues before, however, per records, patient now has [employer].  Patient told me he was taking Metformin and Lantus? Insulin pen back in 2012 before he had financial issues.    Question whether or not patient can physically take insulin at home.  Has extreme upper extremity weakness.  Upon my assessment, patient was very lethargic and had a hard time keeping his eyes open during our conversation.  At this point, I would not recommend sending him home on insulin, however, if he can gain strength in his arms, insulin may be a viable option (would recommend insulin pens versus vial and syringe).  Patient fairly well managed on Lantus 10 units daily at present.  If decision made to send patient home on insulin, recommend Lantus solostar insulin pen 10 units daily along with Metformin (if his creatinine is WNL- noted baseline creatinine 1.4- if baseline creatinine truly is 1.4, would not recommend starting Metformin back).  If patient unable to take insulin at home, recommend starting a DPP-4 inhibitor like Tradgenta 5 mg daily in addition to Metformin (Metformin only if Creatinine WNL).   Will follow closely. Ambrose Finland RN, MSN, CDE Diabetes Coordinator Inpatient Diabetes Program [phone]

## 2012-07-05 NOTE — Progress Notes (Signed)
Stroke Team Progress Note  HISTORY 62 y.o. male with a PMH of DM, diabetic neuropathy, stage III colon cancer status post subtotal colectomy and chemotherapy (11 cycles of FOLFOX) who presents to the ER 06/29/2012 with generalized weakness and left foot pain. He has not seen a doctor in over 2 years due to financial issues. The patient states his LLE has been discolored and tender for about a week. No fever, but reports subjective chills. He has not checked his glucoses in 2 years. Pt found to have L foot gangrene and sepsis with pos. Blood cx. S/P L BKA, s/p extubation. 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 Pt s/p MRI found to have multiple b/l watershed infarcts. Patient was not a TPA candidate secondary to unknown time of onset. He was admitted for further evaluation and treatment.  SUBJECTIVE His sister is at the bedside.  Overall he feels his condition is stable. He states his right-sided weakness began even before he came to the hospital prior to his amputation.  OBJECTIVE Most recent Vital Signs: Filed Vitals:   07/04/12 2000 07/04/12 2340 07/05/12 0345 07/05/12 0805  BP: 116/59 114/56 123/60   Pulse: 91 90 89   Temp: 97.4 F (36.3 C) 98.4 F (36.9 C) 98.5 F (36.9 C) 98.3 F (36.8 C)  TempSrc: Axillary Axillary Axillary Oral  Resp: 16     Height:      Weight:      SpO2: 97% 95% 96%    CBG (last 3)   Basename 07/05/12 0801 07/04/12 2206 07/04/12 1633  GLUCAP 162* 229* 156*    IV Fluid Intake:     . sodium chloride Stopped (07/01/12 1600)    MEDICATIONS    . antiseptic oral rinse  15 mL Mouth Rinse BID  . cefTRIAXone (ROCEPHIN)  IV  2 g Intravenous Q24H  . diphenoxylate-atropine  10 mL Oral QID  . feeding supplement  237 mL Oral BID BM  . feeding supplement  30 mL Oral TID WC  . heparin subcutaneous  5,000 Units Subcutaneous Q8H  . insulin aspart  0-15 Units Subcutaneous TID WC  . insulin glargine  10 Units  Subcutaneous Daily  . potassium chloride  10 mEq Intravenous Q1 Hr x 4  . saccharomyces boulardii  250 mg Oral BID  . sodium chloride  3 mL Intravenous Q12H   PRN:  acetaminophen, albuterol, HYDROmorphone (DILAUDID) injection, ondansetron (ZOFRAN) IV, oxyCODONE-acetaminophen  Diet:    NPO Activity:  Up in chair DVT Prophylaxis:  Heparin 5000 units sq tid  CLINICALLY SIGNIFICANT STUDIES Basic Metabolic Panel:  Lab 07/05/12 1610 07/03/12 1600 07/02/12 0458 07/01/12 0635  NA 137 141 -- --  K 2.8* 3.6 -- --  CL 97 107 -- --  CO2 30 23 -- --  GLUCOSE 188* 154* -- --  BUN 62* 69* -- --  CREATININE 1.93* 2.12* -- --  CALCIUM 7.5* 7.2* -- --  MG -- -- 2.4 2.1  PHOS -- -- 2.8 1.9*   Liver Function Tests:  Lab 07/03/12 0719 07/02/12 0458  AST 73* 91*  ALT 31 30  ALKPHOS 152* 73  BILITOT 2.6* 2.6*  PROT 5.9* 5.0*  ALBUMIN 0.9* 0.9*   CBC:  Lab 07/05/12 0415 07/03/12 0717  WBC 8.7 15.7*  NEUTROABS -- --  HGB 9.6* 9.4*  HCT 27.8* 26.9*  MCV 81.5 83.3  PLT 86* 85*   Coagulation:  Lab 06/30/12 0935 06/29/12 1420  LABPROT 16.8* 16.9*  INR  1.40 1.41   Cardiac Enzymes:  Lab 06/30/12 0609  CKTOTAL --  CKMB --  CKMBINDEX --  TROPONINI <0.30   Urinalysis:  Lab 06/30/12 0928 06/29/12 1601  COLORURINE AMBER* AMBER*  LABSPEC 1.027 1.026  PHURINE 5.0 5.0  GLUCOSEU 100* >1000*  HGBUR LARGE* LARGE*  BILIRUBINUR SMALL* SMALL*  KETONESUR 15* TRACE*  PROTEINUR 100* 100*  UROBILINOGEN 1.0 1.0  NITRITE NEGATIVE NEGATIVE  LEUKOCYTESUR NEGATIVE NEGATIVE   Lipid Panel No results found for this basename: chol, trig, hdl, cholhdl, vldl, ldlcalc   HgbA1C  Lab Results  Component Value Date   HGBA1C 10.1* 06/29/2012   Urine Drug Screen:   No results found for this basename: labopia, cocainscrnur, labbenz, amphetmu, thcu, labbarb    Alcohol Level: No results found for this basename: ETH:2 in the last 168 hours  CT of the brain    MRI of the brain  07/04/2012   1. Small acute  infarcts in the right cerebellum and dorsal brain stem.  Two punctate acute infarcts suspected also in the left occipital and parietal lobe. No mass effect or hemorrhage. Given the distribution, embolic phenomena to the posterior circulation is possible.  The supratentorial findings might also reflect synchronous small vessel disease. 2.  No other acute intracranial abnormality and otherwise largely unremarkable for age MRI appearance of the brain.  MRA of the brain  07/04/2012 Negative intracranial MRA.    MRI Cervical Spine 07/04/2012  1.  Multifactorial cervical spinal stenosis with mild spinal cord mass effect from C4-C5 to C6-C7.  No spinal cord signal abnormality. 2.  Moderate to severe left C7 foraminal stenosis related to disc and endplate changes. Mild to moderate multifactorial bilateral C6 foraminal stenosis.   2D Echocardiogram  No vegetations that are large are noted, but study is suboptimal to exclude.  Carotid Doppler    CXR  07/02/2012 Tip of endotracheal tube now 2.0 cm above carina. Remainder exam unchanged.  EKG  Atrial paced complexes.   Therapy Recommendations   Physical Exam   Frail middle aged Caucasian male not in distress. Has left BKA with bandage. Rt forearm has bandage.Awake alert. Afebrile. Head is nontraumatic. Neck is supple without bruit. Hearing is normal. Cardiac exam no murmur or gallop. Lungs are clear to auscultation.  Neurological Exam ; awake alert oriented x2 with diminished attention and short-term memory. Follows commands well. No aphasia or dysarthria. Extraocular moments are full range without nystagmus. Is mild right lower facial symmetry. Fundi were not visualized. Vision acuity and fields appear adequate. No upper or lower expected drift but mild weakness of the right grip and intrinsic hand muscles as well as right hip flexors and ankle dorsiflexors 4/5. Left lower extremity exam limited do to amputation. The right upper extremity strength testing limited  do to pain and bandage and the forearm. Gait was not tested ASSESSMENT [name] is a 62 y.o. male presenting with generalized weakness and right hand weakness/numbness that occurred prior to admission. Imaging confirms bilateral posterior circulaltion infarcts. Infarcts felt to be embolic secondary to unknown etiology, need to rule out endocarditis.  Work up underway. On no antiplatelets prior to admission. Now on no antiplatelets for secondary stroke prevention. Patient with resultant right arm hemiparesis, ,more in the hand .   Colon adenocarcinoma, stage III, status post subtotal colectomy and chemotherapy Diabetes, HgbA1c 10.1 LBKA, post op D5 due to gangrene of foot Group A strep bacteremia,septic shock, on Rocephin x 6 wks  Hospital day # 6  TREATMENT/PLAN  Add aspirin 325 mg orally every day for secondary stroke prevention.  F/u lipid panel  Add carotid doppler TEE to look for embolic source-vegetations. Arranged with [facility] for today.  If positive for PFO (patent foramen ovale), check bilateral lower extremity venous dopplers to rule out DVT as possible source of stroke. PT, OT evals when ok with vascular  Tyler Main, MSN, RN, ANVP-BC, ANP-BC, GNP-BC [facility] Pager: [phone] 07/05/2012 8:16 AM  I have personally obtained a history, examined the patient, evaluated imaging results, and formulated the assessment and plan of care. I agree with the above.   Delia Heady, MD Medical Director [facility] Pager: [phone] 07/05/2012 5:56 PM

## 2012-07-05 NOTE — CV Procedure (Signed)
    Transesophageal Echocardiogram Note  HIEP OLLIS [id number] [birth year]  Procedure: Transesophageal Echocardiogram Indications: bacteremia, hx of CVA  Procedure Details Consent: Obtained Time Out: Verified patient identification, verified procedure, site/side was marked, verified correct patient position, special equipment/implants available, Radiology Safety Procedures followed,  medications/allergies/relevent history reviewed, required imaging and test results available.  Performed  Medications: Fentanyl: 50 mcg IV Versed: 4 mg IV  Left Ventrical:  Normal functino  Mitral Valve: trace MR, no vegetation  Aortic Valve: normal 3 leaflet valve, trivial AI, no vegetation  Tricuspid Valve: no vegetation, no significant TR  Pulmonic Valve: trace PI  Left Atrium/ Left atrial appendage: no thrombus  Atrial septum: no PFO by bubble study or color Doppler  Aorta: normal    Complications: No apparent complications Patient did tolerate procedure well.   Vesta Mixer, Montez Hageman., MD, [facility] 07/05/2012, 11:09 AM

## 2012-07-05 NOTE — Progress Notes (Signed)
[facility] PROGRESS NOTE  Tyler Erickson Tyler [name]:[id number] DOB: [birth year] DOA: 06/29/2012 PCP: Tyler Stabile, MD  Brief narrative: 62 y.o. male with a PMH of DM, diabetic neuropathy, stage III colon cancer status post subtotal colectomy and chemotherapy (11 cycles of FOLFOX) who presented to the ER 06/29/2012 with generalized weakness and left foot pain. He had not seen a doctor in over 2 years due to financial issues. The patient stated his LLE has been discolored and tender for about a week. No fever, but reported subjective chills. He had not checked his glucoses in 2 years. Pt found to have L foot gangrene and sepsis with positive blood cx.   Now S/P L BKA.  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 Pt s/p MRI found to have multiple b/l watershed infarcts. MRI cervical spine demonstrated multifactorial cervical spinal stenosis with mild spinal cord mass effect from C4-C5 to C6-C7 without spinal cord signal abnormality.  In addition there was moderate to severe left C7 foraminal stenosis related to disc and endplate changes.  LINES / TUBES:  1/03 R IJ CVL >>  1/03 ETT >> 1/5  1/03 Rt femoral Aline   CULTURES:  1/2 blood >> GPC in chains >> Group A strep sens pending.  1/2 urine >> negative  ANTIBIOTICS:  1/2 clindamycin >> 1/6 1/2 zosyn >> 1/6 1/3 vancomycin >> 1/3 Rocephin 1/6 >>  Procedures:  1/03 RUE arteriogram >> Normal arch anatomy, diffusely atherosclerotic right upper extremity arterial tree unreconstructable 1/03 - L BKA 1/8: TEE >> normal LV function, trace MR without vegetations, no atrial thrombus, no PFO   Assessment/Plan:  Gangrene of foot Stable - followed by Vascular Surgery post BKA  Group A strep bacteremia/ Septic shock Total 6 wks of antibiotics - currently on Rocephin - ID following - plan to d/c IJ and place PICC  Right hand pain/weakness -?? P[place]-Turner  syndrome versus cervical spinal stenosis with mild mass effect Compartment pressures determine by Ortho in OR and not found to be eleveated - arch aortogram revealed diffuse disease but no focal stenosis or thrombosis - CT and MRI brain revealed multiple embolic infarcts - ?? brachial plexus abnormality versus secondary to cervical spinal stenosis - awaiting neurology comment  Embolic infarcts TEE today shows no evidence of cardiac vegetation or emboli - need for ID to make comment regarding whether he should continue empiric treatment for possible septic etiology - Neurology to make comment regarding whether needs to continue anticoagulation after discharge  Diarrhea Started Lomotil - probiotics started by ID - per pts brother in law, he had diarrhea before admission - H/o colon CA last follow up CT was 2011  DM Only takes metformin at home but sugars run in 300s -  a1c 10.1 - cont to adjust tx regimen and follow  AKI (acute kidney injury) Baseline cr about 1.4 - BUN trending down but still in the 60's with creatinine down to 1.93-  since has preserved LV function will increase IVF's to 100/hr and follow lytes  Anasarca Due to hypoalbuminemia and sepsis - Appears to have resolved w/ diuretics and albumin  Severe protein-calorie malnutrition Pt has a great appetite - just needs to be fed due to right hand pain - Added glucerna in between meals  Hypokalemia Supplement and follow - check Mg level   Code Status: full code Family Communication: spoke w/ family in the room  Disposition Plan: cont to follow in SDU DVT  prophylaxis: heparin  Consultants:  Vasc Surgery  ID  Cardiology for TEE  Neurology  Orthopedics - [facility]  HPI/Subjective: Continues to have issues with discomfort and pain in right hand. Denies nausea vomiting diarrhea or abdominal pain. No shortness of breath or chest pain. Examined prior to TEE procedure.  Objective: Filed Vitals:   07/05/12 1125  07/05/12 1130 07/05/12 1135 07/05/12 1140  BP: 126/74 135/75 135/75 138/73  Pulse:      Temp:      TempSrc:      Resp: 20 24 67 22  Height:      Weight:      SpO2: 99% 99% 96% 96%    Intake/Output Summary (Last 24 hours) at 07/05/12 1144 Last data filed at 07/05/12 0900  Gross per 24 hour  Intake    730 ml  Output   5900 ml  Net  -5170 ml    Exam:   General:  Alert, no distress  Cardiovascular: RRR, no murmurs  Respiratory:  CTA b/l   Abdomen: soft, NT, ND, BS+- rectal tube in place  Ext: left BKA- right leg no c/c/e  Neuro- right hand 2/5 with subjective pain and numbness- left arm and right leg 5/5, did not examine left leg  Data Reviewed: Basic Metabolic Panel:  Lab 07/05/12 1610 07/03/12 1600 07/03/12 0719 07/02/12 0458 07/01/12 0635 06/29/12 1420  NA 137 141 141 139 140 --  K 2.8* 3.6 3.7 3.3* 3.4* --  CL 97 107 107 106 107 --  CO2 30 23 21 20 20  --  GLUCOSE 188* 154* 165* 129* 214* --  BUN 62* 69* 73* 69* 61* --  CREATININE 1.93* 2.12* 2.11* 2.21* 1.80* --  CALCIUM 7.5* 7.2* 6.8* 6.7* 6.2* --  MG -- -- -- 2.4 2.1 2.6*  PHOS -- -- -- 2.8 1.9* --   Liver Function Tests:  Lab 07/03/12 0719 07/02/12 0458 07/01/12 0635 06/30/12 0609 06/29/12 1355  AST 73* 91* 154* 66* 34  ALT 31 30 46 12 10  ALKPHOS 152* 73 49 46 105  BILITOT 2.6* 2.6* 1.8* 1.2 0.8  PROT 5.9* 5.0* 4.5* 4.7* 6.0  ALBUMIN 0.9* 0.9* 1.0* 1.2* 1.9*   CBC:  Lab 07/05/12 0415 07/03/12 0717 07/02/12 0458 07/01/12 0635 06/30/12 0609  WBC 8.7 15.7* 12.6* 9.7 8.9  NEUTROABS -- -- -- -- --  HGB 9.6* 9.4* 8.9* 10.2* 11.8*  HCT 27.8* 26.9* 25.5* 28.8* 33.6*  MCV 81.5 83.3 80.7 79.3 82.2  PLT 86* 85* PLATELET CLUMPS NOTED ON SMEAR, COUNT APPEARS DECREASED 113* 111*   Cardiac Enzymes:  Lab 06/30/12 0609  CKTOTAL --  CKMB --  CKMBINDEX --  TROPONINI <0.30   CBG:  Lab 07/05/12 0801 07/04/12 2206 07/04/12 1633 07/04/12 1121 07/04/12 0804  GLUCAP 162* 229* 156* 171* 172*    Recent  Results (from the past 240 hour(s))  CULTURE, BLOOD (ROUTINE X 2)     Status: Normal   Collection Time   06/29/12  2:15 PM      Component Value Range Status Comment   Specimen Description BLOOD RIGHT HAND   Final    Special Requests BOTTLES DRAWN AEROBIC AND ANAEROBIC 4CC EACH   Final    Culture  Setup Time 06/29/2012 21:41   Final    Culture     Final    Value: GROUP A STREP (S.PYOGENES) ISOLATED     Note: CRITICAL RESULT CALLED TO, READ BACK BY AND VERIFIED WITH: CHRIS ARMITAGE 07/01/12 @ 10:45AM BY  RUSCA.     Note: Gram Stain Report Called to,Read Back By and Verified With: TERESA O LEARY 06/30/12 1040 BY SMITHERSJ   Report Status 07/01/2012 FINAL   Final   CULTURE, BLOOD (ROUTINE X 2)     Status: Normal   Collection Time   06/29/12  2:20 PM      Component Value Range Status Comment   Specimen Description BLOOD RIGHT THUMB   Final    Special Requests BOTTLES DRAWN AEROBIC ONLY 3CC   Final    Culture  Setup Time 06/29/2012 21:40   Final    Culture     Final    Value: GROUP A STREP (S.PYOGENES) ISOLATED     Note: CRITICAL RESULT CALLED TO, READ BACK BY AND VERIFIED WITH: CHRIS ARMITAGE 07/01/12 @ 10:45AM BY RUSCA.     Note: PAM ALVORD 06/30/12 1135 BY SMITHERSJ   Report Status 07/01/2012 FINAL   Final   URINE CULTURE     Status: Normal   Collection Time   06/29/12  4:01 PM      Component Value Range Status Comment   Specimen Description URINE, CLEAN CATCH   Final    Special Requests NONE   Final    Culture  Setup Time 06/30/2012 02:24   Final    Colony Count NO GROWTH   Final    Culture NO GROWTH   Final    Report Status 07/01/2012 FINAL   Final   MRSA PCR SCREENING     Status: Normal   Collection Time   06/29/12  8:56 PM      Component Value Range Status Comment   MRSA by PCR NEGATIVE  NEGATIVE Final   SURGICAL PCR SCREEN     Status: Normal   Collection Time   06/30/12 10:08 AM      Component Value Range Status Comment   MRSA, PCR NEGATIVE  NEGATIVE Final    Staphylococcus aureus  NEGATIVE  NEGATIVE Final   CULTURE, BLOOD (ROUTINE X 2)     Status: Normal (Preliminary result)   Collection Time   07/03/12  6:06 PM      Component Value Range Status Comment   Specimen Description BLOOD LEFT THUMB   Final    Special Requests BOTTLES DRAWN AEROBIC ONLY 2CC   Final    Culture  Setup Time 07/04/2012 01:53   Final    Culture     Final    Value:        BLOOD CULTURE RECEIVED NO GROWTH TO DATE CULTURE WILL BE HELD FOR 5 DAYS BEFORE ISSUING A FINAL NEGATIVE REPORT   Report Status PENDING   Incomplete   CLOSTRIDIUM DIFFICILE BY PCR     Status: Normal   Collection Time   07/04/12  4:00 AM      Component Value Range Status Comment   C difficile by pcr NEGATIVE  NEGATIVE Final      Studies: Dg Chest 2 View  06/29/2012  *RADIOLOGY REPORT*  Clinical Data: 62 year old male with weakness and swelling.  CHEST - 2 VIEW  Comparison: 10/28/2009 and prior chest radiographs  Findings: The cardiomediastinal silhouette is unremarkable. Mild peribronchial thickening is stable. There is no evidence of focal airspace disease, pulmonary edema, suspicious pulmonary nodule/mass, pleural effusion, or pneumothorax. No acute bony abnormalities are identified.  IMPRESSION: No evidence of active cardiopulmonary disease.   Original Report Authenticated By: Harmon Pier, M.D.    Dg Shoulder Right  06/29/2012  *RADIOLOGY REPORT*  Clinical Data:  62 year old male with right shoulder pain and swelling.  RIGHT SHOULDER - 2+ VIEW  Comparison: 10/28/2009 chest radiograph  Findings: There is no evidence of acute bony abnormality. There is no evidence of acute fracture, subluxation, or dislocation. No focal bony lesions are identified. The visualized right hemithorax is unremarkable.  Moderate degenerative changes at the [facility] joint noted. Decreased acromiohumeral space may represent a chronic rotator cuff tear.  IMPRESSION:  No evidence of acute bony abnormality.  Moderate AC joint degenerative changes and possible chronic  rotator cuff tear.   Original Report Authenticated By: Harmon Pier, M.D.    Dg Forearm Right  06/29/2012  *RADIOLOGY REPORT*  Clinical Data: Right forearm pain and swelling.  RIGHT FOREARM - 2 VIEW  Comparison: None  Findings: No evidence of acute fracture, subluxation or dislocation identified.  No radio-opaque foreign bodies are present.  No focal bony lesions are noted.  The joint spaces are unremarkable.  Vascular calcifications are noted.  IMPRESSION: No evidence of acute bony abnormality.   Original Report Authenticated By: Harmon Pier, M.D.    Ct Head Wo Contrast  07/01/2012  *RADIOLOGY REPORT*  Clinical Data: Right arm weakness.  Rule out stroke.  CT HEAD WITHOUT CONTRAST  Technique:  Contiguous axial images were obtained from the base of the skull through the vertex without contrast.  Comparison: None.  Findings: Mild atrophy.  Negative for acute infarct.  Negative for hemorrhage or mass lesion.  No fluid collection is present. Calvarium is intact.  IMPRESSION: Mild atrophy.  No acute abnormality.   Original Report Authenticated By: Janeece Riggers, M.D.    Mr [facility] Wo Contrast  07/04/2012  *RADIOLOGY REPORT*  Clinical Data:  62 year old male with slurred speech, difficulty moving right shoulder.  Recent left lower extremity amputation.  Comparison:  Head CT 07/01/2012.  MRI HEAD WITHOUT CONTRAST MRI CERVICAL SPINE WITHOUT CONTRAST  Technique:  Multiplanar, multiecho pulse sequences of the brain and surrounding structures, and cervical spine, to include the craniocervical junction and cervicothoracic junction, were obtained without intravenous contrast.  MRI HEAD  Findings:  Punctate cortical focus of restricted diffusion in the left parietal lobe, posterior to the sensory strip (series 5 image 23).  No associated T2 or FLAIR hyperintensity.  Possible additional punctate area of restriction in the left occipital lobe white matter (image 13).  There is a small focus of restricted diffusion along the  anterior right cerebellar tonsil (series 5 image 5).  There are to other punctate areas of restricted diffusion in the right cerebellum, one involving the right middle cerebellar peduncle (series 5 image 8). Probably also one additional punctate area of restriction in the dorsal medulla also on this image.  Minimal associated T2 and FLAIR hyperintensity.  Diffusion elsewhere is within normal limits. No acute intracranial hemorrhage identified.  No midline shift, mass effect, or evidence of mass lesion.  No ventriculomegaly. Major intracranial vascular flow voids are preserved, MRA findings are below.  Outside of the above findings, there is only mild for age scattered cerebral white matter T2 and FLAIR hyperintensity, mostly subcortical.  Negative pituitary cervicomedullary junction. Cervical spine findings are below.  Visualized orbit soft tissues are within normal limits.  Trace mastoid effusions.  Negative visualized nasopharynx. Mild maxillary and ethmoid sinus mucosal thickening.  Negative scalp soft tissues.  IMPRESSION: 1. 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  Two punctate acute infarcts suspected also in the left occipital and parietal lobe. No mass effect or hemorrhage. Given the  distribution, embolic phenomena to the posterior circulation is possible.  The supratentorial findings might also reflect synchronous small vessel disease. 2.  No other acute intracranial abnormality and otherwise largely unremarkable for age MRI appearance of the brain.  3.  Cervical spine and MRA findings are below.  MRI CERVICAL SPINE  Findings: Preserved cervical lordosis. No marrow edema or evidence of acute osseous abnormality.  Subcutaneous edema in the posterior paraspinal soft tissues, possible mild involvement of the superficial aspect of the underlying erector spinae muscles.  This may be related to prolonged bedrest, the conditioning. Other Visualized paraspinal soft tissues are within  normal limits.  Cervicomedullary junction is within normal limits.  Despite spinal stenosis, described below, no cervical spinal cord signal abnormality.  C2-C3:  Negative.  C3-C4:  Mild circumferential disc bulge.  Mild uncovertebral hypertrophy.  Mild bilateral C4 foraminal stenosis.  C4-C5:  Circumferential disc osteophyte complex.  Broad-based central right paracentral posterior component of disc.  Spinal stenosis with mild spinal cord flattening.  Mild left C5 foraminal stenosis.  C5-C6:  Circumferential disc osteophyte complex.  Moderate ligament flavum hypertrophy.  Broad-based posterior component of disc. Spinal stenosis with mild spinal cord flattening.  Mild to moderate bilateral C6 foraminal stenosis.  C6-C7:  Circumferential disc osteophyte complex.  Broad-based left eccentric posterior component of disc.  Moderate ligament flavum hypertrophy.  Spinal stenosis with minimal spinal cord mass effect. Moderate to severe left and mild right C7 foraminal stenosis.  C7-T1:  Mild facet hypertrophy.  Mild left greater than right C8 foraminal stenosis.  Negative visualized upper thoracic levels.  IMPRESSION: 1.  Multifactorial cervical spinal stenosis with mild spinal cord mass effect from C4-C5 to C6-C7.  No spinal cord signal abnormality. 2.  Moderate to severe left C7 foraminal stenosis related to disc and endplate changes. Mild to moderate multifactorial bilateral C6 foraminal stenosis. 3.  MRA findings are below.  MRA HEAD WITHOUT CONTRAST  Technique:  Angiographic images of the Circle of Willis were obtained using MRA technique without intravenous contrast.  Findings:  Antegrade flow in the posterior circulation.  Codominant distal vertebral arteries.  Normal right PICA.  Normal vertebrobasilar junction.  Prominent right AICA also noted.  No left PICA or AICA origin identified. Subsequently, the left SCA might be dominant.  No basilar stenosis.  SCA and PCA origins are normal.  Diminutive posterior  communicating arteries.  Bilateral PCA branches are within normal limits.  Antegrade flow in both ICA siphons.  Mild supraclinoid ICA irregularity.  No focal ICA stenosis.  Patent carotid termini.  MCA and ACA origins are within normal limits.  Diminutive or absent anterior communicating artery.  Visualized ACA branches are within normal limits.  Visualized bilateral MCA branches are within normal limits.  IMPRESSION: Negative intracranial MRA.   Original Report Authenticated By: Erskine Speed, M.D.    Mr Brain Wo Contrast  07/04/2012  *RADIOLOGY REPORT*  Clinical Data:  62 year old male with slurred speech, difficulty moving right shoulder.  Recent left lower extremity amputation.  Comparison:  Head CT 07/01/2012.  MRI HEAD WITHOUT CONTRAST MRI CERVICAL SPINE WITHOUT CONTRAST  Technique:  Multiplanar, multiecho pulse sequences of the brain and surrounding structures, and cervical spine, to include the craniocervical junction and cervicothoracic junction, were obtained without intravenous contrast.  MRI HEAD  Findings:  Punctate cortical focus of restricted diffusion in the left parietal lobe, posterior to the sensory strip (series 5 image 23).  No associated T2 or FLAIR hyperintensity.  Possible additional punctate area of restriction  in the left occipital lobe white matter (image 13).  There is a small focus of restricted diffusion along the anterior right cerebellar tonsil (series 5 image 5).  There are to other punctate areas of restricted diffusion in the right cerebellum, one involving the right middle cerebellar peduncle (series 5 image 8). Probably also one additional punctate area of restriction in the dorsal medulla also on this image.  Minimal associated T2 and FLAIR hyperintensity.  Diffusion elsewhere is within normal limits. No acute intracranial hemorrhage identified.  No midline shift, mass effect, or evidence of mass lesion.  No ventriculomegaly. Major intracranial vascular flow voids are preserved,  MRA findings are below.  Outside of the above findings, there is only mild for age scattered cerebral white matter T2 and FLAIR hyperintensity, mostly subcortical.  Negative pituitary cervicomedullary junction. Cervical spine findings are below.  Visualized orbit soft tissues are within normal limits.  Trace mastoid effusions.  Negative visualized nasopharynx. Mild maxillary and ethmoid sinus mucosal thickening.  Negative scalp soft tissues.  IMPRESSION: 1. 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  Two punctate acute infarcts suspected also in the left occipital and parietal lobe. No mass effect or hemorrhage. Given the distribution, embolic phenomena to the posterior circulation is possible.  The supratentorial findings might also reflect synchronous small vessel disease. 2.  No other acute intracranial abnormality and otherwise largely unremarkable for age MRI appearance of the brain.  3.  Cervical spine and MRA findings are below.  MRI CERVICAL SPINE  Findings: Preserved cervical lordosis. No marrow edema or evidence of acute osseous abnormality.  Subcutaneous edema in the posterior paraspinal soft tissues, possible mild involvement of the superficial aspect of the underlying erector spinae muscles.  This may be related to prolonged bedrest, the conditioning. Other Visualized paraspinal soft tissues are within normal limits.  Cervicomedullary junction is within normal limits.  Despite spinal stenosis, described below, no cervical spinal cord signal abnormality.  C2-C3:  Negative.  C3-C4:  Mild circumferential disc bulge.  Mild uncovertebral hypertrophy.  Mild bilateral C4 foraminal stenosis.  C4-C5:  Circumferential disc osteophyte complex.  Broad-based central right paracentral posterior component of disc.  Spinal stenosis with mild spinal cord flattening.  Mild left C5 foraminal stenosis.  C5-C6:  Circumferential disc osteophyte complex.  Moderate ligament flavum hypertrophy.   Broad-based posterior component of disc. Spinal stenosis with mild spinal cord flattening.  Mild to moderate bilateral C6 foraminal stenosis.  C6-C7:  Circumferential disc osteophyte complex.  Broad-based left eccentric posterior component of disc.  Moderate ligament flavum hypertrophy.  Spinal stenosis with minimal spinal cord mass effect. Moderate to severe left and mild right C7 foraminal stenosis.  C7-T1:  Mild facet hypertrophy.  Mild left greater than right C8 foraminal stenosis.  Negative visualized upper thoracic levels.  IMPRESSION: 1.  Multifactorial cervical spinal stenosis with mild spinal cord mass effect from C4-C5 to C6-C7.  No spinal cord signal abnormality. 2.  Moderate to severe left C7 foraminal stenosis related to disc and endplate changes. Mild to moderate multifactorial bilateral C6 foraminal stenosis. 3.  MRA findings are below.  MRA HEAD WITHOUT CONTRAST  Technique:  Angiographic images of the Circle of Willis were obtained using MRA technique without intravenous contrast.  Findings:  Antegrade flow in the posterior circulation.  Codominant distal vertebral arteries.  Normal right PICA.  Normal vertebrobasilar junction.  Prominent right AICA also noted.  No left PICA or AICA origin identified. Subsequently, the left SCA might be dominant.  No basilar stenosis.  SCA and PCA origins are normal.  Diminutive posterior communicating arteries.  Bilateral PCA branches are within normal limits.  Antegrade flow in both ICA siphons.  Mild supraclinoid ICA irregularity.  No focal ICA stenosis.  Patent carotid termini.  MCA and ACA origins are within normal limits.  Diminutive or absent anterior communicating artery.  Visualized ACA branches are within normal limits.  Visualized bilateral MCA branches are within normal limits.  IMPRESSION: Negative intracranial MRA.   Original Report Authenticated By: Erskine Speed, M.D.    Mr Cervical Spine Wo Contrast  07/04/2012  *RADIOLOGY REPORT*  Clinical Data:   62 year old male with slurred speech, difficulty moving right shoulder.  Recent left lower extremity amputation.  Comparison:  Head CT 07/01/2012.  MRI HEAD WITHOUT CONTRAST MRI CERVICAL SPINE WITHOUT CONTRAST  Technique:  Multiplanar, multiecho pulse sequences of the brain and surrounding structures, and cervical spine, to include the craniocervical junction and cervicothoracic junction, were obtained without intravenous contrast.  MRI HEAD  Findings:  Punctate cortical focus of restricted diffusion in the left parietal lobe, posterior to the sensory strip (series 5 image 23).  No associated T2 or FLAIR hyperintensity.  Possible additional punctate area of restriction in the left occipital lobe white matter (image 13).  There is a small focus of restricted diffusion along the anterior right cerebellar tonsil (series 5 image 5).  There are to other punctate areas of restricted diffusion in the right cerebellum, one involving the right middle cerebellar peduncle (series 5 image 8). Probably also one additional punctate area of restriction in the dorsal medulla also on this image.  Minimal associated T2 and FLAIR hyperintensity.  Diffusion elsewhere is within normal limits. No acute intracranial hemorrhage identified.  No midline shift, mass effect, or evidence of mass lesion.  No ventriculomegaly. Major intracranial vascular flow voids are preserved, MRA findings are below.  Outside of the above findings, there is only mild for age scattered cerebral white matter T2 and FLAIR hyperintensity, mostly subcortical.  Negative pituitary cervicomedullary junction. Cervical spine findings are below.  Visualized orbit soft tissues are within normal limits.  Trace mastoid effusions.  Negative visualized nasopharynx. Mild maxillary and ethmoid sinus mucosal thickening.  Negative scalp soft tissues.  IMPRESSION: 1. 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  Two punctate acute infarcts suspected also in  the left occipital and parietal lobe. No mass effect or hemorrhage. Given the distribution, embolic phenomena to the posterior circulation is possible.  The supratentorial findings might also reflect synchronous small vessel disease. 2.  No other acute intracranial abnormality and otherwise largely unremarkable for age MRI appearance of the brain.  3.  Cervical spine and MRA findings are below.  MRI CERVICAL SPINE  Findings: Preserved cervical lordosis. No marrow edema or evidence of acute osseous abnormality.  Subcutaneous edema in the posterior paraspinal soft tissues, possible mild involvement of the superficial aspect of the underlying erector spinae muscles.  This may be related to prolonged bedrest, the conditioning. Other Visualized paraspinal soft tissues are within normal limits.  Cervicomedullary junction is within normal limits.  Despite spinal stenosis, described below, no cervical spinal cord signal abnormality.  C2-C3:  Negative.  C3-C4:  Mild circumferential disc bulge.  Mild uncovertebral hypertrophy.  Mild bilateral C4 foraminal stenosis.  C4-C5:  Circumferential disc osteophyte complex.  Broad-based central right paracentral posterior component of disc.  Spinal stenosis with mild spinal cord flattening.  Mild left C5 foraminal stenosis.  C5-C6:  Circumferential disc osteophyte complex.  Moderate ligament flavum hypertrophy.  Broad-based posterior component of disc. Spinal stenosis with mild spinal cord flattening.  Mild to moderate bilateral C6 foraminal stenosis.  C6-C7:  Circumferential disc osteophyte complex.  Broad-based left eccentric posterior component of disc.  Moderate ligament flavum hypertrophy.  Spinal stenosis with minimal spinal cord mass effect. Moderate to severe left and mild right C7 foraminal stenosis.  C7-T1:  Mild facet hypertrophy.  Mild left greater than right C8 foraminal stenosis.  Negative visualized upper thoracic levels.  IMPRESSION: 1.  Multifactorial cervical spinal  stenosis with mild spinal cord mass effect from C4-C5 to C6-C7.  No spinal cord signal abnormality. 2.  Moderate to severe left C7 foraminal stenosis related to disc and endplate changes. Mild to moderate multifactorial bilateral C6 foraminal stenosis. 3.  MRA findings are below.  MRA HEAD WITHOUT CONTRAST  Technique:  Angiographic images of the Circle of Willis were obtained using MRA technique without intravenous contrast.  Findings:  Antegrade flow in the posterior circulation.  Codominant distal vertebral arteries.  Normal right PICA.  Normal vertebrobasilar junction.  Prominent right AICA also noted.  No left PICA or AICA origin identified. Subsequently, the left SCA might be dominant.  No basilar stenosis.  SCA and PCA origins are normal.  Diminutive posterior communicating arteries.  Bilateral PCA branches are within normal limits.  Antegrade flow in both ICA siphons.  Mild supraclinoid ICA irregularity.  No focal ICA stenosis.  Patent carotid termini.  MCA and ACA origins are within normal limits.  Diminutive or absent anterior communicating artery.  Visualized ACA branches are within normal limits.  Visualized bilateral MCA branches are within normal limits.  IMPRESSION: Negative intracranial MRA.   Original Report Authenticated By: Erskine Speed, M.D.    Dg Chest Port 1 View  07/02/2012  *RADIOLOGY REPORT*  Clinical Data: Advancement of endotracheal tube  PORTABLE CHEST - 1 VIEW  Comparison: Portable exam 0626 hours compared to 07/02/2012 at 0514 hours  Findings: Tip of endotracheal tube now 2.0 cm above carina. Nasogastric tube and right jugular line unchanged. Stable heart size. Persistent atelectasis versus consolidation left lower lobe. Mild right basilar atelectasis. Upper lungs clear.  IMPRESSION: Tip of endotracheal tube now 2.0 cm above carina. Remainder exam unchanged.   Original Report Authenticated By: Ulyses Southward, M.D.    Dg Chest Port 1 View  07/02/2012  *RADIOLOGY REPORT*  Clinical Data:  Follow up atelectasis  PORTABLE CHEST - 1 VIEW  Comparison: Portable exam 0514 hours compared to 07/01/2012  Findings: Tip of endotracheal tube in cervical region, 11.8 cm above carina. Right jugular central venous catheter tip projecting over SVC. Nasogastric tube extends into stomach. Numerous cardiac monitoring leads project over chest. Upper-normal size of cardiac silhouette. Mediastinal contours and pulmonary vascularity normal. Minimal right basilar atelectasis. Slightly increased atelectasis versus consolidation in medial left lower lobe. No definite pleural effusion or pneumothorax.  IMPRESSION: Minimal right basilar atelectasis with increased atelectasis versus consolidation in left lower lobe. High position of endotracheal tube 11.8 cm above carina; may consider advancing tube 6 cm for more stable position within thoracic trachea.   Original Report Authenticated By: Ulyses Southward, M.D.    Dg Chest Port 1 View  07/01/2012  *RADIOLOGY REPORT*  Clinical Data: Acute respiratory failure on ventilator.  Colon carcinoma.  PORTABLE CHEST - 1 VIEW  Comparison: 06/30/2012  Findings: Endotracheal tube remains high in position with the tip approximately 11 cm above the carina.  Both lungs are clear.  Heart size is normal.  Right jugular center venous catheter remains in appropriate position.  IMPRESSION:  1.  High endotracheal tube position, with tip approximately 11 cm above carina. 2.  No active lung disease.   Original Report Authenticated By: Myles Rosenthal, M.D.    Dg Chest Port 1 View  06/30/2012  *RADIOLOGY REPORT*  Clinical Data: Endotracheal placement  PORTABLE CHEST - 1 VIEW  Comparison: Same day  Findings: Endotracheal tip is 9 cm above the carina, at the thoracic inlet.  Right internal jugular central line is in the SVC at the azygos level.  Minimal atelectasis again noted in the left lower lobe.  No other change.  IMPRESSION: Endotracheal tube somewhat high, 9 cm above the carina at the thoracic inlet.    Original Report Authenticated By: Paulina Fusi, M.D.    Dg Chest Portable 1 View  06/30/2012  *RADIOLOGY REPORT*  Clinical Data: Central line placement.  PORTABLE CHEST - 1 VIEW  Comparison: Chest radiograph performed 06/29/2012  Findings: The lungs are well-aerated.  Mild right basilar opacity may reflect atelectasis or possibly pneumonia.  There is no evidence of pleural effusion or pneumothorax.  The cardiomediastinal silhouette is within normal limits.  No acute osseous abnormalities are seen.  A right IJ line is noted ending about the mid SVC.  IMPRESSION:  1.  Right IJ line noted ending about the mid SVC. 2.  Mild right basilar airspace opacity may reflect atelectasis or possibly pneumonia.   Original Report Authenticated By: Tonia Ghent, M.D.    Dg Foot Complete Left  06/29/2012  *RADIOLOGY REPORT*  Clinical Data: 62 year old male with left foot pain and wounds on left foot.  LEFT FOOT - COMPLETE 3+ VIEW  Comparison: None  Findings: Soft tissue gas and swelling is identified at the level of the lateral MTP joints. There is no evidence of acute fracture, subluxation or dislocation. The Lisfranc joints are intact. No focal bony lesions are present except for a large plantar calcaneal spur. Vascular calcifications are present.  IMPRESSION: Soft tissue gas and swelling overlying the distal foot suspicious either for open wound or gas forming infection.  No radiographic evidence of osteomyelitis.  No acute bony abnormalities identified.  Calcaneal spur and vascular calcifications.   Original Report Authenticated By: Harmon Pier, M.D.     Scheduled Meds:    . antiseptic oral rinse  15 mL Mouth Rinse BID  . aspirin  325 mg Oral Daily  . cefTRIAXone (ROCEPHIN)  IV  2 g Intravenous Q24H  . diphenoxylate-atropine  10 mL Oral QID  . feeding supplement  237 mL Oral BID BM  . feeding supplement  30 mL Oral TID WC  . heparin subcutaneous  5,000 Units Subcutaneous Q8H  . insulin aspart  0-15 Units  Subcutaneous TID WC  . insulin glargine  10 Units Subcutaneous Daily  . potassium chloride  10 mEq Intravenous Q1 Hr x 4  . saccharomyces boulardii  250 mg Oral BID  . sodium chloride  3 mL Intravenous Q12H   Continuous Infusions:    . sodium chloride 15 mL/hr at 07/05/12 1123   _______________________________________________________________________   Algernon Huxley  [facility] Pager [phone] If 8PM-8AM, please contact night-coverage at [email or web address], password [facility] 07/05/2012, 11:44 AM  LOS: 6 days    I have personally examined this patient and reviewed the entire database. I have reviewed the above note, made any necessary editorial changes, and agree with its content.  Lonia Blood, MD [facility]

## 2012-07-05 NOTE — Evaluation (Addendum)
Physical Therapy Evaluation Patient Details Name: Tyler Erickson MRN: [id number] DOB: 1950/10/17 Today's Date: 07/05/2012 Time: 1340-1410 PT Time Calculation (min): 30 min  PT Assessment / Plan / Recommendation Clinical Impression  62 y.o. male with a PMH of DM, diabetic neuropathy, stage III colon cancer status post subtotal colectomy and chemotherapy (11 cycles of FOLFOX) who presents to the ER 06/29/2012 with generalized weakness and left foot pain. He has not seen a doctor in over 2 years due to financial issues. The patient states his LLE has been discolored and tender for about a week. No fever, but reports subjective chills. He has not checked his glucoses in 2 years. Pt found to have L foot gangrene and sepsis with pos. Blood cx. S/P L BKA, s/p extubation. 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 Pt s/p MRI found to have multiple b/l watershed infarcts. MRI cervical spine demonstrated multifactorial cervical spinal stenosis with mild spinal cord mass effect from C4-C5 to C6-C7 without spinal cord signal abnormality in addition there was moderate to severe left C7 foraminal stenosis related to disc and endplate changes Patient was not a TPA candidate secondary to unknown time of onset. Limited PT evaluation due to pt lethargic and having difficulty keeping eyes open.  Pt with noticeable bilateral UE weakness > right LE weakness.  Will continue to follow pt.  At this time recommend inpatient rehab.    PT Assessment  Patient needs continued PT services    Follow Up Recommendations  CIR    Barriers to Discharge  (Further assess family ability to assist pt at home)      Equipment Recommendations   (will continue to determine)    Recommendations for Other Services Rehab consult;OT consult   Frequency Min 3X/week    Precautions / Restrictions Precautions Precautions: Fall Restrictions Weight Bearing Restrictions: No   Pertinent  Vitals/Pain C/o bilateral shoulder and arm pain but does not rate;       Mobility  Bed Mobility Bed Mobility: Rolling Right;Rolling Left;Supine to Sit;Sitting - Scoot to [place] of Bed;Sit to Supine Rolling Right: 3: Mod assist Rolling Left: 3: Mod assist Supine to Sit: 1: +2 Total assist Supine to Sit: Patient Percentage: 40% Sitting - Scoot to Edge of Bed: 1: +2 Total assist Sitting - Scoot to Edge of Bed: Patient Percentage: 50% Sit to Supine: 1: +2 Total assist Sit to Supine: Patient Percentage: 50% Details for Bed Mobility Assistance: (A) to initiate and complete rolls with max cues for hand placement.  +2 (A) with bed mobility with (A) with trunk in/out of bed and (A) LE in bed.   Transfers Transfers: Not assessed Ambulation/Gait Ambulation/Gait Assistance: Not tested (comment) Stairs: No Wheelchair Mobility Wheelchair Mobility: No Modified Rankin (Stroke Patients Only) Pre-Morbid Rankin Score: No symptoms Modified Rankin: Severe disability     PT Diagnosis: Abnormality of gait;Generalized weakness;Acute pain  PT Problem List: Decreased strength;Decreased activity tolerance;Decreased balance;Decreased mobility;Decreased range of motion;Decreased coordination;Decreased cognition;Decreased knowledge of use of DME PT Treatment Interventions: DME instruction;Functional mobility training;Therapeutic activities;Therapeutic exercise;Balance training;Neuromuscular re-education;Cognitive remediation;Patient/family education   PT Goals Acute Rehab PT Goals PT Goal Formulation: With patient/family Time For Goal Achievement: 07/19/12 Potential to Achieve Goals: Fair Pt will go Supine/Side to Sit: with min assist PT Goal: Supine/Side to Sit - Progress: Goal set today Pt will Sit at Edge of Bed: with supervision;3-5 min PT Goal: Sit at Edge Of Bed - Progress: Goal set today Pt will go Sit to Supine/Side:  with min assist PT Goal: Sit to Supine/Side - Progress: Goal set today Pt will  go Sit to Stand: with max assist PT Goal: Sit to Stand - Progress: Goal set today Pt will go Stand to Sit: with max assist PT Goal: Stand to Sit - Progress: Goal set today Pt will Transfer Bed to Chair/Chair to Bed: with max assist (sliding board vs squat pivot) PT Transfer Goal: Bed to Chair/Chair to Bed - Progress: Goal set today  Visit Information  Last PT Received On: 07/05/12 Assistance Needed: +2    Subjective Data  Subjective: "I'm really sleepy." Patient Stated Goal: Did not set   Prior Functioning  Home Living Lives With: Other (Comment) (Brother) Available Help at Discharge: Other (Comment) (Brother works 10-12 hours shifts) Type of Home: Apartment Home Access: Stairs to enter [employer] of Steps: 4 (one step on walk way) Entrance Stairs-Rails: None Home Layout: One level Bathroom Shower/Tub: Forensic scientist: Standard Bathroom Accessibility: No Home Adaptive Equipment: Bedside commode/3-in-1 Prior Function Level of Independence: Independent Able to Take Stairs?: Yes Driving: Yes Vocation: On disability Communication Communication: No difficulties Dominant Hand: Right    Cognition  Overall Cognitive Status: Impaired Area of Impairment: Attention;Problem solving Arousal/Alertness: Lethargic Orientation Level: Appears intact for tasks assessed Behavior During Session: Lethargic Current Attention Level: Sustained Problem Solving: Pt slow to process and easily falls asleep during task    Extremity/Trunk Assessment Right Upper Extremity Assessment RUE ROM/Strength/Tone: Deficits RUE ROM/Strength/Tone Deficits: wrist extension/flexion: flaccid, elbow flexion: 2/5, elbow extension: 2/5, shoulder flexion: 2/5  RUE Sensation: Deficits RUE Sensation Deficits: Impaired touch forearm to fingers Left Upper Extremity Assessment LUE ROM/Strength/Tone: Deficits LUE ROM/Strength/Tone Deficits: gross 2+/5 LUE Sensation: WFL - Light  Touch Right Lower Extremity Assessment RLE ROM/Strength/Tone: Deficits RLE ROM/Strength/Tone Deficits: 3/5 quad; 3/5 hamstring, 3+/5 DF RLE Sensation: Deficits RLE Sensation Deficits: Impaired light touch Left Lower Extremity Assessment LLE ROM/Strength/Tone: Unable to fully assess LLE Sensation: History of peripheral neuropathy   Balance Balance Balance Assessed: Yes Static Sitting Balance Static Sitting - Balance Support: Feet supported;No upper extremity supported Static Sitting - Level of Assistance: 3: Mod assist;5: Stand by assistance Static Sitting - Comment/# of Minutes: Initial (A) to maintain upright posture to prevent posterior and right sided lean. Dynamic Sitting Balance Dynamic Sitting - Balance Support:  (right foot supported) Dynamic Sitting - Level of Assistance: 3: Mod assist Dynamic Sitting Balance - Compensations: (A) to maintain balance to perform lateral weight sifts x 2 each side.  Increase assistance needed from right side to midline vs left side to midline Dynamic Sitting - Balance Activities: Lateral lean/weight shifting  End of Session PT - End of Session Equipment Utilized During Treatment:  (none) Activity Tolerance: Patient limited by fatigue Patient left: in bed;with call bell/phone within reach Nurse Communication: Mobility status;Need for lift equipment  GP     Jerlyn Pain 07/05/2012, 3:17 PM [place], PT DPT [phone]

## 2012-07-05 NOTE — Interval H&P Note (Signed)
History and Physical Interval Note:  07/05/2012 10:50 AM  Tyler Erickson  has presented today for surgery, with the diagnosis of Bacterenia  The various methods of treatment have been discussed with the patient and family. After consideration of risks, benefits and other options for treatment, the patient has consented to  Procedure(s) (LRB) with comments: TRANSESOPHAGEAL ECHOCARDIOGRAM (TEE) (N/A) as a surgical intervention .  The patient's history has been reviewed, patient examined, no change in status, stable for surgery.  I have reviewed the patient's chart and labs.  Questions were answered to the patient's satisfaction.     Elyn Aquas.

## 2012-07-06 ENCOUNTER — Encounter (HOSPITAL_COMMUNITY): Payer: Self-pay | Admitting: Cardiovascular Disease

## 2012-07-06 DIAGNOSIS — I6330 Cerebral infarction due to thrombosis of unspecified cerebral artery: Secondary | ICD-10-CM

## 2012-07-06 DIAGNOSIS — L98499 Non-pressure chronic ulcer of skin of other sites with unspecified severity: Secondary | ICD-10-CM

## 2012-07-06 DIAGNOSIS — R609 Edema, unspecified: Secondary | ICD-10-CM

## 2012-07-06 DIAGNOSIS — S88119A Complete traumatic amputation at level between knee and ankle, unspecified lower leg, initial encounter: Secondary | ICD-10-CM

## 2012-07-06 DIAGNOSIS — I739 Peripheral vascular disease, unspecified: Secondary | ICD-10-CM

## 2012-07-06 LAB — CULTURE, BLOOD (ROUTINE X 2)

## 2012-07-06 LAB — CBC
Hemoglobin: 9 g/dL — ABNORMAL LOW (ref 13.0–17.0)
MCH: 28.4 pg (ref 26.0–34.0)
MCV: 84.2 fL (ref 78.0–100.0)
RBC: 3.17 MIL/uL — ABNORMAL LOW (ref 4.22–5.81)
WBC: 9.1 10*3/uL (ref 4.0–10.5)

## 2012-07-06 LAB — BASIC METABOLIC PANEL
BUN: 44 mg/dL — ABNORMAL HIGH (ref 6–23)
CO2: 27 mEq/L (ref 19–32)
Calcium: 6.7 mg/dL — ABNORMAL LOW (ref 8.4–10.5)
Creatinine, Ser: 1.38 mg/dL — ABNORMAL HIGH (ref 0.50–1.35)
Glucose, Bld: 223 mg/dL — ABNORMAL HIGH (ref 70–99)

## 2012-07-06 LAB — LIPID PANEL: Cholesterol: 76 mg/dL (ref 0–200)

## 2012-07-06 LAB — GLUCOSE, CAPILLARY
Glucose-Capillary: 197 mg/dL — ABNORMAL HIGH (ref 70–99)
Glucose-Capillary: 201 mg/dL — ABNORMAL HIGH (ref 70–99)

## 2012-07-06 MED ORDER — SODIUM CHLORIDE 0.9 % IJ SOLN
INTRAMUSCULAR | Status: AC
  Filled 2012-07-06: qty 10

## 2012-07-06 MED ORDER — MAGNESIUM SULFATE 40 MG/ML IJ SOLN
2.0000 g | Freq: Once | INTRAMUSCULAR | Status: AC
  Administered 2012-07-06: 2 g via INTRAVENOUS
  Filled 2012-07-06 (×2): qty 50

## 2012-07-06 MED ORDER — CHOLESTYRAMINE 4 G PO PACK
4.0000 g | PACK | Freq: Three times a day (TID) | ORAL | Status: DC
  Administered 2012-07-06 – 2012-07-11 (×17): 4 g via ORAL
  Filled 2012-07-06 (×18): qty 1

## 2012-07-06 NOTE — Progress Notes (Signed)
Pt transferred to 4N. This CSW has notified new unit CSW of dc plans for possible CIR or SNF placement. This CSW signing off, unit CSW to begin following & facilitating with dc plans.  Theresia Bough, MSW, Theresia Majors [phone]

## 2012-07-06 NOTE — Clinical Social Work Placement (Addendum)
    Clinical Social Work Department CLINICAL SOCIAL WORK PLACEMENT NOTE 07/06/2012  Patient:  Tyler Erickson, Tyler Erickson  Account Number:  [id number] Admit date:  06/29/2012  Clinical Social Worker:  Theresia Bough, Theresia Majors  Date/time:  07/06/2012 10:26 AM  Clinical Social Work is seeking post-discharge placement for this patient at the following level of care:   SKILLED NURSING   (*CSW will update this form in Epic as items are completed)   07/06/2012  Patient/family provided with [facility] Department of Clinical Social Work's list of facilities offering this level of care within the geographic area requested by the patient (or if unable, by the patient's family).  07/06/2012  Patient/family informed of their freedom to choose among providers that offer the needed level of care, that participate in Medicare, Medicaid or managed care program needed by the patient, have an available bed and are willing to accept the patient.  07/06/2012  Patient/family informed of [facility]' ownership interest in [facility], as well as of the fact that they are under no obligation to receive care at this facility.  PASARR submitted to EDS on 07/06/2012 PASARR number received from EDS on 07/06/2012  FL2 transmitted to all facilities in geographic area requested by pt/family on  07/06/2012 FL2 transmitted to all facilities within larger geographic area on   Patient informed that his/her managed care company has contracts with or will negotiate with  certain facilities, including the following:     Patient/family informed of bed offers received:  07/09/12 Dorma Russell) Patient chooses bed at [facility] Physician recommends and patient chooses bed at    Patient to be transferred to [facility] on  07/11/12 Patient to be transferred to facility by Ambulance  The following physician request were entered in Epic:   Additional Comments: 07/11/12: CSW intern facilitated patient  transport to facility via ambulance. Family advised of discharge.   Fernande Boyden, CSW Intern  07/11/12

## 2012-07-06 NOTE — Progress Notes (Deleted)
[facility] PROGRESS NOTE  Tyler Erickson:[id number] DOB: 1950/12/19 DOA: 06/29/2012 PCP: Benita Stabile, MD  Brief narrative: 62 y.o. male with a PMH of DM, diabetic neuropathy, stage III colon cancer status post subtotal colectomy and chemotherapy (11 cycles of FOLFOX) who presented to the ER 06/29/2012 with generalized weakness and left foot pain. He had not seen a doctor in over 2 years due to financial issues. The patient stated his LLE has been discolored and tender for about a week. No fever, but reported subjective chills. He had not checked his glucoses in 2 years. Pt found to have L foot gangrene and sepsis with positive blood cx.   Now S/P L BKA.  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 Pt s/p MRI found to have multiple b/l watershed infarcts. MRI cervical spine demonstrated multifactorial cervical spinal stenosis with mild spinal cord mass effect from C4-C5 to C6-C7 without spinal cord signal abnormality.  In addition there was moderate to severe left C7 foraminal stenosis related to disc and endplate changes.  LINES / TUBES:  1/03 R IJ CVL >>  1/03 ETT >> 1/5  1/03 Rt femoral Aline   CULTURES:  1/2 blood >> GPC in chains >> Group A strep sens pending.  1/2 urine >> negative  ANTIBIOTICS:  1/2 clindamycin >> 1/6 1/2 zosyn >> 1/6 1/3 vancomycin >> 1/3 Rocephin 1/6 >>  Procedures:  1/03 RUE arteriogram >> Normal arch anatomy, diffusely atherosclerotic right upper extremity arterial tree unreconstructable 1/03 - L BKA 1/8: TEE >> normal LV function, trace MR without vegetations, no atrial thrombus, no PFO   Assessment/Plan:  Gangrene of foot Stable - followed by Vascular Surgery post BKA  Group A strep bacteremia/ Septic shock  Total 6 wks of antibiotics - currently on Rocephin - ID following - plan to d/c IJ and place PICC for long term antibiotics  Right hand  pain/weakness -?? Parsonage-Turner syndrome versus cervical spinal stenosis with mild mass effect Compartment pressures determine by Ortho in OR and not found to be eleveated - arch aortogram revealed diffuse disease but no focal stenosis or thrombosis - CT and MRI brain revealed multiple embolic infarcts - ?? brachial plexus abnormality versus secondary to cervical spinal stenosis - awaiting neurology comment  Left upper extremity edema Concerning for possible DVT especially with recent IV in same arm so we'll check upper extremity venous duplex  Embolic infarcts TEE today shows no evidence of cardiac vegetation or emboli - need for ID to make comment regarding whether he should continue empiric treatment for possible septic etiology - Neurology recommends full dose aspirin daily for stroke prophylaxis-neurology also recommends transcranial Doppler bubble study with emboli monitoring with Dr. Pearlean Brownie in one month to further evaluate or possible PFO  Diarrhea/hypomagnesemia Probiotics started by ID - per pts brother in law, he had diarrhea before admission - H/o colon CA last follow up CT was 2011-will add cholestyramine-will provide magnesium bolus today and followup magnesium level-would avoid proton pump inhibitors-consider oral repletion noting that sometimes magnesium can precipitate diarrhea  DM Only takes metformin at home but sugars run in 300s -  a1c 10.1 - cont to adjust tx regimen and follow-currently adjusting Lantus insulin but unclear with patient's upper extremity neurological problems if she will be able to administer insulin-states he is utilizing insulin at home in the past-may need to consider oral agents also  AKI (acute kidney injury) Baseline cr about 1.4 - BUN  and creatinine continued to trend downward with IV fluids at 100/hr and follow lytes  Anasarca Due to hypoalbuminemia and sepsis - Appears to have resolved w/ diuretics and albumin  Severe protein-calorie  malnutrition Pt has a great appetite - just needs to be fed due to right hand pain - Added glucerna in between meals  Hypokalemia Supplement and follow - check Mg level -see above  Code Status: full code Family Communication: spoke w/ family in the room  Disposition Plan: Transfer to neuro telemetry DVT prophylaxis: heparin  Consultants:  Vasc Surgery  ID  Cardiology for TEE  Neurology  Orthopedics - [facility]  HPI/Subjective: Patient awake but affect is very blunted and he seems very depressed. Now complaining of discomfort in left arm where is swollen. States no change in pain right hand.  Objective: Filed Vitals:   07/06/12 0400 07/06/12 0500 07/06/12 0801 07/06/12 1200  BP: 129/63  134/59 111/55  Pulse: 87  82   Temp: 97.4 F (36.3 C)  98.7 F (37.1 C) 98.7 F (37.1 C)  TempSrc: Oral  Axillary Axillary  Resp: 30  26   Height:      Weight:  69 kg (152 lb 1.9 oz)    SpO2: 97%  96%     Intake/Output Summary (Last 24 hours) at 07/06/12 1223 Last data filed at 07/06/12 1002  Gross per 24 hour  Intake 2423.58 ml  Output   1725 ml  Net 698.58 ml    Exam:   General:  Alert, no distress  Cardiovascular: RRR, no murmurs  Respiratory:  CTA b/l   Abdomen: soft, NT, ND, BS+- rectal tube in place  Ext: left BKA- right leg no c/c/e  Neuro- right hand 2/5 with subjective pain and numbness- left arm and right leg 5/5, did not examine left leg  Data Reviewed: Basic Metabolic Panel:  Lab 07/06/12 4540 07/05/12 0415 07/03/12 1600 07/03/12 0719 07/02/12 0458 07/01/12 0635 06/29/12 1420  NA 135 137 141 141 139 -- --  K 3.6 2.8* 3.6 3.7 3.3* -- --  CL 100 97 107 107 106 -- --  CO2 27 30 23 21 20  -- --  GLUCOSE 223* 188* 154* 165* 129* -- --  BUN 44* 62* 69* 73* 69* -- --  CREATININE 1.38* 1.93* 2.12* 2.11* 2.21* -- --  CALCIUM 6.7* 7.5* 7.2* 6.8* 6.7* -- --  MG 1.4* -- -- -- 2.4 2.1 2.6*  PHOS -- -- -- -- 2.8 1.9* --   Liver Function Tests:  Lab  07/03/12 0719 07/02/12 0458 07/01/12 0635 06/30/12 0609 06/29/12 1355  AST 73* 91* 154* 66* 34  ALT 31 30 46 12 10  ALKPHOS 152* 73 49 46 105  BILITOT 2.6* 2.6* 1.8* 1.2 0.8  PROT 5.9* 5.0* 4.5* 4.7* 6.0  ALBUMIN 0.9* 0.9* 1.0* 1.2* 1.9*   CBC:  Lab 07/06/12 1100 07/05/12 0415 07/03/12 0717 07/02/12 0458 07/01/12 0635  WBC 9.1 8.7 15.7* 12.6* 9.7  NEUTROABS -- -- -- -- --  HGB 9.0* 9.6* 9.4* 8.9* 10.2*  HCT 26.7* 27.8* 26.9* 25.5* 28.8*  MCV 84.2 81.5 83.3 80.7 79.3  PLT 87* 86* 85* PLATELET CLUMPS NOTED ON SMEAR, COUNT APPEARS DECREASED 113*   Cardiac Enzymes:  Lab 06/30/12 0609  CKTOTAL --  CKMB --  CKMBINDEX --  TROPONINI <0.30   CBG:  Lab 07/06/12 0804 07/05/12 2132 07/05/12 1656 07/05/12 1221 07/05/12 0801  GLUCAP 197* 203* 257* 138* 162*    Recent Results (from the past 240  hour(s))  CULTURE, BLOOD (ROUTINE X 2)     Status: Normal   Collection Time   06/29/12  2:15 PM      Component Value Range Status Comment   Specimen Description BLOOD RIGHT HAND   Final    Special Requests BOTTLES DRAWN AEROBIC AND ANAEROBIC 4CC EACH   Final    Culture  Setup Time 06/29/2012 21:41   Final    Culture     Final    Value: GROUP A STREP (S.PYOGENES) ISOLATED     Note: CRITICAL RESULT CALLED TO, READ BACK BY AND VERIFIED WITH: CHRIS ARMITAGE 07/01/12 @ 10:45AM BY RUSCA. FAXED TO [facility]     Note: Gram Stain Report Called to,Read Back By and Verified With: TERESA Antony Salmon 06/30/12 1040 BY SMITHERSJ   Report Status 07/06/2012 FINAL   Final   CULTURE, BLOOD (ROUTINE X 2)     Status: Normal   Collection Time   06/29/12  2:20 PM      Component Value Range Status Comment   Specimen Description BLOOD RIGHT THUMB   Final    Special Requests BOTTLES DRAWN AEROBIC ONLY 3CC   Final    Culture  Setup Time 06/29/2012 21:40   Final    Culture     Final    Value: GROUP A STREP (S.PYOGENES) ISOLATED     Note: CRITICAL RESULT CALLED TO, READ BACK BY AND VERIFIED WITH: CHRIS ARMITAGE  07/01/12 @ 10:45AM BY RUSCA. FAXED TO [facility]     Note: PAM ALVORD 06/30/12 1135 BY SMITHERSJ   Report Status 07/06/2012 FINAL   Final   URINE CULTURE     Status: Normal   Collection Time   06/29/12  4:01 PM      Component Value Range Status Comment   Specimen Description URINE, CLEAN CATCH   Final    Special Requests NONE   Final    Culture  Setup Time 06/30/2012 02:24   Final    Colony Count NO GROWTH   Final    Culture NO GROWTH   Final    Report Status 07/01/2012 FINAL   Final   MRSA PCR SCREENING     Status: Normal   Collection Time   06/29/12  8:56 PM      Component Value Range Status Comment   MRSA by PCR NEGATIVE  NEGATIVE Final   SURGICAL PCR SCREEN     Status: Normal   Collection Time   06/30/12 10:08 AM      Component Value Range Status Comment   MRSA, PCR NEGATIVE  NEGATIVE Final    Staphylococcus aureus NEGATIVE  NEGATIVE Final   CULTURE, BLOOD (ROUTINE X 2)     Status: Normal (Preliminary result)   Collection Time   07/03/12  6:06 PM      Component Value Range Status Comment   Specimen Description BLOOD LEFT THUMB   Final    Special Requests BOTTLES DRAWN AEROBIC ONLY 2CC   Final    Culture  Setup Time 07/04/2012 01:53   Final    Culture     Final    Value:        BLOOD CULTURE RECEIVED NO GROWTH TO DATE CULTURE WILL BE HELD FOR 5 DAYS BEFORE ISSUING A FINAL NEGATIVE REPORT   Report Status PENDING   Incomplete   CLOSTRIDIUM DIFFICILE BY PCR     Status: Normal   Collection Time   07/04/12  4:00 AM  Component Value Range Status Comment   C difficile by pcr NEGATIVE  NEGATIVE Final      Studies: Dg Chest 2 View  06/29/2012  *RADIOLOGY REPORT*  Clinical Data: 62 year old male with weakness and swelling.  CHEST - 2 VIEW  Comparison: 10/28/2009 and prior chest radiographs  Findings: The cardiomediastinal silhouette is unremarkable. Mild peribronchial thickening is stable. There is no evidence of focal airspace disease, pulmonary edema, suspicious pulmonary  nodule/mass, pleural effusion, or pneumothorax. No acute bony abnormalities are identified.  IMPRESSION: No evidence of active cardiopulmonary disease.   Original Report Authenticated By: Harmon Pier, M.D.    Dg Shoulder Right  06/29/2012  *RADIOLOGY REPORT*  Clinical Data: 62 year old male with right shoulder pain and swelling.  RIGHT SHOULDER - 2+ VIEW  Comparison: 10/28/2009 chest radiograph  Findings: There is no evidence of acute bony abnormality. There is no evidence of acute fracture, subluxation, or dislocation. No focal bony lesions are identified. The visualized right hemithorax is unremarkable.  Moderate degenerative changes at the [facility] joint noted. Decreased acromiohumeral space may represent a chronic rotator cuff tear.  IMPRESSION:  No evidence of acute bony abnormality.  Moderate AC joint degenerative changes and possible chronic rotator cuff tear.   Original Report Authenticated By: Harmon Pier, M.D.    Dg Forearm Right  06/29/2012  *RADIOLOGY REPORT*  Clinical Data: Right forearm pain and swelling.  RIGHT FOREARM - 2 VIEW  Comparison: None  Findings: No evidence of acute fracture, subluxation or dislocation identified.  No radio-opaque foreign bodies are present.  No focal bony lesions are noted.  The joint spaces are unremarkable.  Vascular calcifications are noted.  IMPRESSION: No evidence of acute bony abnormality.   Original Report Authenticated By: Harmon Pier, M.D.    Ct Head Wo Contrast  07/01/2012  *RADIOLOGY REPORT*  Clinical Data: Right arm weakness.  Rule out stroke.  CT HEAD WITHOUT CONTRAST  Technique:  Contiguous axial images were obtained from the base of the skull through the vertex without contrast.  Comparison: None.  Findings: Mild atrophy.  Negative for acute infarct.  Negative for hemorrhage or mass lesion.  No fluid collection is present. Calvarium is intact.  IMPRESSION: Mild atrophy.  No acute abnormality.   Original Report Authenticated By: Janeece Riggers, M.D.    Mr [facility] Wo Contrast  07/04/2012  *RADIOLOGY REPORT*  Clinical Data:  62 year old male with slurred speech, difficulty moving right shoulder.  Recent left lower extremity amputation.  Comparison:  Head CT 07/01/2012.  MRI HEAD WITHOUT CONTRAST MRI CERVICAL SPINE WITHOUT CONTRAST  Technique:  Multiplanar, multiecho pulse sequences of the brain and surrounding structures, and cervical spine, to include the craniocervical junction and cervicothoracic junction, were obtained without intravenous contrast.  MRI HEAD  Findings:  Punctate cortical focus of restricted diffusion in the left parietal lobe, posterior to the sensory strip (series 5 image 23).  No associated T2 or FLAIR hyperintensity.  Possible additional punctate area of restriction in the left occipital lobe white matter (image 13).  There is a small focus of restricted diffusion along the anterior right cerebellar tonsil (series 5 image 5).  There are to other punctate areas of restricted diffusion in the right cerebellum, one involving the right middle cerebellar peduncle (series 5 image 8). Probably also one additional punctate area of restriction in the dorsal medulla also on this image.  Minimal associated T2 and FLAIR hyperintensity.  Diffusion elsewhere is within normal limits. No acute intracranial hemorrhage identified.  No midline shift,  mass effect, or evidence of mass lesion.  No ventriculomegaly. Major intracranial vascular flow voids are preserved, MRA findings are below.  Outside of the above findings, there is only mild for age scattered cerebral white matter T2 and FLAIR hyperintensity, mostly subcortical.  Negative pituitary cervicomedullary junction. Cervical spine findings are below.  Visualized orbit soft tissues are within normal limits.  Trace mastoid effusions.  Negative visualized nasopharynx. Mild maxillary and ethmoid sinus mucosal thickening.  Negative scalp soft tissues.  IMPRESSION: 1. Small acute infarcts in the right cerebellum and  dorsal brain stem.  Two punctate acute infarcts suspected also in the left occipital and parietal lobe. No mass effect or hemorrhage. Given the distribution, embolic phenomena to the posterior circulation is possible.  The supratentorial findings might also reflect synchronous small vessel disease. 2.  No other acute intracranial abnormality and otherwise largely unremarkable for age MRI appearance of the brain.  3.  Cervical spine and MRA findings are below.  MRI CERVICAL SPINE  Findings: Preserved cervical lordosis. No marrow edema or evidence of acute osseous abnormality.  Subcutaneous edema in the posterior paraspinal soft tissues, possible mild involvement of the superficial aspect of the underlying erector spinae muscles.  This may be related to prolonged bedrest, the conditioning. Other Visualized paraspinal soft tissues are within normal limits.  Cervicomedullary junction is within normal limits.  Despite spinal stenosis, described below, no cervical spinal cord signal abnormality.  C2-C3:  Negative.  C3-C4:  Mild circumferential disc bulge.  Mild uncovertebral hypertrophy.  Mild bilateral C4 foraminal stenosis.  C4-C5:  Circumferential disc osteophyte complex.  Broad-based central right paracentral posterior component of disc.  Spinal stenosis with mild spinal cord flattening.  Mild left C5 foraminal stenosis.  C5-C6:  Circumferential disc osteophyte complex.  Moderate ligament flavum hypertrophy.  Broad-based posterior component of disc. Spinal stenosis with mild spinal cord flattening.  Mild to moderate bilateral C6 foraminal stenosis.  C6-C7:  Circumferential disc osteophyte complex.  Broad-based left eccentric posterior component of disc.  Moderate ligament flavum hypertrophy.  Spinal stenosis with minimal spinal cord mass effect. Moderate to severe left and mild right C7 foraminal stenosis.  C7-T1:  Mild facet hypertrophy.  Mild left greater than right C8 foraminal stenosis.  Negative visualized upper  thoracic levels.  IMPRESSION: 1.  Multifactorial cervical spinal stenosis with mild spinal cord mass effect from C4-C5 to C6-C7.  No spinal cord signal abnormality. 2.  Moderate to severe left C7 foraminal stenosis related to disc and endplate changes. Mild to moderate multifactorial bilateral C6 foraminal stenosis. 3.  MRA findings are below.  MRA HEAD WITHOUT CONTRAST  Technique:  Angiographic images of the Circle of Willis were obtained using MRA technique without intravenous contrast.  Findings:  Antegrade flow in the posterior circulation.  Codominant distal vertebral arteries.  Normal right PICA.  Normal vertebrobasilar junction.  Prominent right AICA also noted.  No left PICA or AICA origin identified. Subsequently, the left SCA might be dominant.  No basilar stenosis.  SCA and PCA origins are normal.  Diminutive posterior communicating arteries.  Bilateral PCA branches are within normal limits.  Antegrade flow in both ICA siphons.  Mild supraclinoid ICA irregularity.  No focal ICA stenosis.  Patent carotid termini.  MCA and ACA origins are within normal limits.  Diminutive or absent anterior communicating artery.  Visualized ACA branches are within normal limits.  Visualized bilateral MCA branches are within normal limits.  IMPRESSION: Negative intracranial MRA.   Original Report Authenticated By: Odessa Fleming III,  M.D.    Mr Brain Wo Contrast  07/04/2012  *RADIOLOGY REPORT*  Clinical Data:  61 year old male with slurred speech, difficulty moving right shoulder.  Recent left lower extremity amputation.  Comparison:  Head CT 07/01/2012.  MRI HEAD WITHOUT CONTRAST MRI CERVICAL SPINE WITHOUT CONTRAST  Technique:  Multiplanar, multiecho pulse sequences of the brain and surrounding structures, and cervical spine, to include the craniocervical junction and cervicothoracic junction, were obtained without intravenous contrast.  MRI HEAD  Findings:  Punctate cortical focus of restricted diffusion in the left parietal  lobe, posterior to the sensory strip (series 5 image 23).  No associated T2 or FLAIR hyperintensity.  Possible additional punctate area of restriction in the left occipital lobe white matter (image 13).  There is a small focus of restricted diffusion along the anterior right cerebellar tonsil (series 5 image 5).  There are to other punctate areas of restricted diffusion in the right cerebellum, one involving the right middle cerebellar peduncle (series 5 image 8). Probably also one additional punctate area of restriction in the dorsal medulla also on this image.  Minimal associated T2 and FLAIR hyperintensity.  Diffusion elsewhere is within normal limits. No acute intracranial hemorrhage identified.  No midline shift, mass effect, or evidence of mass lesion.  No ventriculomegaly. Major intracranial vascular flow voids are preserved, MRA findings are below.  Outside of the above findings, there is only mild for age scattered cerebral white matter T2 and FLAIR hyperintensity, mostly subcortical.  Negative pituitary cervicomedullary junction. Cervical spine findings are below.  Visualized orbit soft tissues are within normal limits.  Trace mastoid effusions.  Negative visualized nasopharynx. Mild maxillary and ethmoid sinus mucosal thickening.  Negative scalp soft tissues.  IMPRESSION: 1. 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  Two punctate acute infarcts suspected also in the left occipital and parietal lobe. No mass effect or hemorrhage. Given the distribution, embolic phenomena to the posterior circulation is possible.  The supratentorial findings might also reflect synchronous small vessel disease. 2.  No other acute intracranial abnormality and otherwise largely unremarkable for age MRI appearance of the brain.  3.  Cervical spine and MRA findings are below.  MRI CERVICAL SPINE  Findings: Preserved cervical lordosis. No marrow edema or evidence of acute osseous abnormality.   Subcutaneous edema in the posterior paraspinal soft tissues, possible mild involvement of the superficial aspect of the underlying erector spinae muscles.  This may be related to prolonged bedrest, the conditioning. Other Visualized paraspinal soft tissues are within normal limits.  Cervicomedullary junction is within normal limits.  Despite spinal stenosis, described below, no cervical spinal cord signal abnormality.  C2-C3:  Negative.  C3-C4:  Mild circumferential disc bulge.  Mild uncovertebral hypertrophy.  Mild bilateral C4 foraminal stenosis.  C4-C5:  Circumferential disc osteophyte complex.  Broad-based central right paracentral posterior component of disc.  Spinal stenosis with mild spinal cord flattening.  Mild left C5 foraminal stenosis.  C5-C6:  Circumferential disc osteophyte complex.  Moderate ligament flavum hypertrophy.  Broad-based posterior component of disc. Spinal stenosis with mild spinal cord flattening.  Mild to moderate bilateral C6 foraminal stenosis.  C6-C7:  Circumferential disc osteophyte complex.  Broad-based left eccentric posterior component of disc.  Moderate ligament flavum hypertrophy.  Spinal stenosis with minimal spinal cord mass effect. Moderate to severe left and mild right C7 foraminal stenosis.  C7-T1:  Mild facet hypertrophy.  Mild left greater than right C8 foraminal stenosis.  Negative visualized upper thoracic levels.  IMPRESSION:  1.  Multifactorial cervical spinal stenosis with mild spinal cord mass effect from C4-C5 to C6-C7.  No spinal cord signal abnormality. 2.  Moderate to severe left C7 foraminal stenosis related to disc and endplate changes. Mild to moderate multifactorial bilateral C6 foraminal stenosis. 3.  MRA findings are below.  MRA HEAD WITHOUT CONTRAST  Technique:  Angiographic images of the Circle of Willis were obtained using MRA technique without intravenous contrast.  Findings:  Antegrade flow in the posterior circulation.  Codominant distal vertebral  arteries.  Normal right PICA.  Normal vertebrobasilar junction.  Prominent right AICA also noted.  No left PICA or AICA origin identified. Subsequently, the left SCA might be dominant.  No basilar stenosis.  SCA and PCA origins are normal.  Diminutive posterior communicating arteries.  Bilateral PCA branches are within normal limits.  Antegrade flow in both ICA siphons.  Mild supraclinoid ICA irregularity.  No focal ICA stenosis.  Patent carotid termini.  MCA and ACA origins are within normal limits.  Diminutive or absent anterior communicating artery.  Visualized ACA branches are within normal limits.  Visualized bilateral MCA branches are within normal limits.  IMPRESSION: Negative intracranial MRA.   Original Report Authenticated By: Erskine Speed, M.D.    Mr Cervical Spine Wo Contrast  07/04/2012  *RADIOLOGY REPORT*  Clinical Data:  62 year old male with slurred speech, difficulty moving right shoulder.  Recent left lower extremity amputation.  Comparison:  Head CT 07/01/2012.  MRI HEAD WITHOUT CONTRAST MRI CERVICAL SPINE WITHOUT CONTRAST  Technique:  Multiplanar, multiecho pulse sequences of the brain and surrounding structures, and cervical spine, to include the craniocervical junction and cervicothoracic junction, were obtained without intravenous contrast.  MRI HEAD  Findings:  Punctate cortical focus of restricted diffusion in the left parietal lobe, posterior to the sensory strip (series 5 image 23).  No associated T2 or FLAIR hyperintensity.  Possible additional punctate area of restriction in the left occipital lobe white matter (image 13).  There is a small focus of restricted diffusion along the anterior right cerebellar tonsil (series 5 image 5).  There are to other punctate areas of restricted diffusion in the right cerebellum, one involving the right middle cerebellar peduncle (series 5 image 8). Probably also one additional punctate area of restriction in the dorsal medulla also on this image.   Minimal associated T2 and FLAIR hyperintensity.  Diffusion elsewhere is within normal limits. No acute intracranial hemorrhage identified.  No midline shift, mass effect, or evidence of mass lesion.  No ventriculomegaly. Major intracranial vascular flow voids are preserved, MRA findings are below.  Outside of the above findings, there is only mild for age scattered cerebral white matter T2 and FLAIR hyperintensity, mostly subcortical.  Negative pituitary cervicomedullary junction. Cervical spine findings are below.  Visualized orbit soft tissues are within normal limits.  Trace mastoid effusions.  Negative visualized nasopharynx. Mild maxillary and ethmoid sinus mucosal thickening.  Negative scalp soft tissues.  IMPRESSION: 1. 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  Two punctate acute infarcts suspected also in the left occipital and parietal lobe. No mass effect or hemorrhage. Given the distribution, embolic phenomena to the posterior circulation is possible.  The supratentorial findings might also reflect synchronous small vessel disease. 2.  No other acute intracranial abnormality and otherwise largely unremarkable for age MRI appearance of the brain.  3.  Cervical spine and MRA findings are below.  MRI CERVICAL SPINE  Findings: Preserved cervical lordosis. No marrow edema or evidence  of acute osseous abnormality.  Subcutaneous edema in the posterior paraspinal soft tissues, possible mild involvement of the superficial aspect of the underlying erector spinae muscles.  This may be related to prolonged bedrest, the conditioning. Other Visualized paraspinal soft tissues are within normal limits.  Cervicomedullary junction is within normal limits.  Despite spinal stenosis, described below, no cervical spinal cord signal abnormality.  C2-C3:  Negative.  C3-C4:  Mild circumferential disc bulge.  Mild uncovertebral hypertrophy.  Mild bilateral C4 foraminal stenosis.  C4-C5:  Circumferential  disc osteophyte complex.  Broad-based central right paracentral posterior component of disc.  Spinal stenosis with mild spinal cord flattening.  Mild left C5 foraminal stenosis.  C5-C6:  Circumferential disc osteophyte complex.  Moderate ligament flavum hypertrophy.  Broad-based posterior component of disc. Spinal stenosis with mild spinal cord flattening.  Mild to moderate bilateral C6 foraminal stenosis.  C6-C7:  Circumferential disc osteophyte complex.  Broad-based left eccentric posterior component of disc.  Moderate ligament flavum hypertrophy.  Spinal stenosis with minimal spinal cord mass effect. Moderate to severe left and mild right C7 foraminal stenosis.  C7-T1:  Mild facet hypertrophy.  Mild left greater than right C8 foraminal stenosis.  Negative visualized upper thoracic levels.  IMPRESSION: 1.  Multifactorial cervical spinal stenosis with mild spinal cord mass effect from C4-C5 to C6-C7.  No spinal cord signal abnormality. 2.  Moderate to severe left C7 foraminal stenosis related to disc and endplate changes. Mild to moderate multifactorial bilateral C6 foraminal stenosis. 3.  MRA findings are below.  MRA HEAD WITHOUT CONTRAST  Technique:  Angiographic images of the Circle of Willis were obtained using MRA technique without intravenous contrast.  Findings:  Antegrade flow in the posterior circulation.  Codominant distal vertebral arteries.  Normal right PICA.  Normal vertebrobasilar junction.  Prominent right AICA also noted.  No left PICA or AICA origin identified. Subsequently, the left SCA might be dominant.  No basilar stenosis.  SCA and PCA origins are normal.  Diminutive posterior communicating arteries.  Bilateral PCA branches are within normal limits.  Antegrade flow in both ICA siphons.  Mild supraclinoid ICA irregularity.  No focal ICA stenosis.  Patent carotid termini.  MCA and ACA origins are within normal limits.  Diminutive or absent anterior communicating artery.  Visualized ACA branches  are within normal limits.  Visualized bilateral MCA branches are within normal limits.  IMPRESSION: Negative intracranial MRA.   Original Report Authenticated By: Erskine Speed, M.D.    Dg Chest Port 1 View  07/02/2012  *RADIOLOGY REPORT*  Clinical Data: Advancement of endotracheal tube  PORTABLE CHEST - 1 VIEW  Comparison: Portable exam 0626 hours compared to 07/02/2012 at 0514 hours  Findings: Tip of endotracheal tube now 2.0 cm above carina. Nasogastric tube and right jugular line unchanged. Stable heart size. Persistent atelectasis versus consolidation left lower lobe. Mild right basilar atelectasis. Upper lungs clear.  IMPRESSION: Tip of endotracheal tube now 2.0 cm above carina. Remainder exam unchanged.   Original Report Authenticated By: Ulyses Southward, M.D.    Dg Chest Port 1 View  07/02/2012  *RADIOLOGY REPORT*  Clinical Data: Follow up atelectasis  PORTABLE CHEST - 1 VIEW  Comparison: Portable exam 0514 hours compared to 07/01/2012  Findings: Tip of endotracheal tube in cervical region, 11.8 cm above carina. Right jugular central venous catheter tip projecting over SVC. Nasogastric tube extends into stomach. Numerous cardiac monitoring leads project over chest. Upper-normal size of cardiac silhouette. Mediastinal contours and pulmonary vascularity normal. Minimal right basilar  atelectasis. Slightly increased atelectasis versus consolidation in medial left lower lobe. No definite pleural effusion or pneumothorax.  IMPRESSION: Minimal right basilar atelectasis with increased atelectasis versus consolidation in left lower lobe. High position of endotracheal tube 11.8 cm above carina; may consider advancing tube 6 cm for more stable position within thoracic trachea.   Original Report Authenticated By: Ulyses Southward, M.D.    Dg Chest Port 1 View  07/01/2012  *RADIOLOGY REPORT*  Clinical Data: Acute respiratory failure on ventilator.  Colon carcinoma.  PORTABLE CHEST - 1 VIEW  Comparison: 06/30/2012  Findings:  Endotracheal tube remains high in position with the tip approximately 11 cm above the carina.  Both lungs are clear.  Heart size is normal.  Right jugular center venous catheter remains in appropriate position.  IMPRESSION:  1.  High endotracheal tube position, with tip approximately 11 cm above carina. 2.  No active lung disease.   Original Report Authenticated By: Myles Rosenthal, M.D.    Dg Chest Port 1 View  06/30/2012  *RADIOLOGY REPORT*  Clinical Data: Endotracheal placement  PORTABLE CHEST - 1 VIEW  Comparison: Same day  Findings: Endotracheal tip is 9 cm above the carina, at the thoracic inlet.  Right internal jugular central line is in the SVC at the azygos level.  Minimal atelectasis again noted in the left lower lobe.  No other change.  IMPRESSION: Endotracheal tube somewhat high, 9 cm above the carina at the thoracic inlet.   Original Report Authenticated By: Paulina Fusi, M.D.    Dg Chest Portable 1 View  06/30/2012  *RADIOLOGY REPORT*  Clinical Data: Central line placement.  PORTABLE CHEST - 1 VIEW  Comparison: Chest radiograph performed 06/29/2012  Findings: The lungs are well-aerated.  Mild right basilar opacity may reflect atelectasis or possibly pneumonia.  There is no evidence of pleural effusion or pneumothorax.  The cardiomediastinal silhouette is within normal limits.  No acute osseous abnormalities are seen.  A right IJ line is noted ending about the mid SVC.  IMPRESSION:  1.  Right IJ line noted ending about the mid SVC. 2.  Mild right basilar airspace opacity may reflect atelectasis or possibly pneumonia.   Original Report Authenticated By: Tonia Ghent, M.D.    Dg Foot Complete Left  06/29/2012  *RADIOLOGY REPORT*  Clinical Data: 62 year old male with left foot pain and wounds on left foot.  LEFT FOOT - COMPLETE 3+ VIEW  Comparison: None  Findings: Soft tissue gas and swelling is identified at the level of the lateral MTP joints. There is no evidence of acute fracture, subluxation or  dislocation. The Lisfranc joints are intact. No focal bony lesions are present except for a large plantar calcaneal spur. Vascular calcifications are present.  IMPRESSION: Soft tissue gas and swelling overlying the distal foot suspicious either for open wound or gas forming infection.  No radiographic evidence of osteomyelitis.  No acute bony abnormalities identified.  Calcaneal spur and vascular calcifications.   Original Report Authenticated By: Harmon Pier, M.D.     Scheduled Meds:    . antiseptic oral rinse  15 mL Mouth Rinse BID  . aspirin  325 mg Oral Daily  . cefTRIAXone (ROCEPHIN)  IV  2 g Intravenous Q24H  . cholestyramine  4 g Oral TID  . feeding supplement  237 mL Oral BID BM  . feeding supplement  30 mL Oral TID WC  . heparin subcutaneous  5,000 Units Subcutaneous Q8H  . insulin aspart  0-15 Units Subcutaneous TID WC  .  insulin glargine  14 Units Subcutaneous Daily  . potassium chloride  40 mEq Oral BID  . saccharomyces boulardii  250 mg Oral BID  . sodium chloride  3 mL Intravenous Q12H  . sodium chloride       Continuous Infusions:    . sodium chloride 100 mL/hr at 07/05/12 2143   _______________________________________________________________________   Algernon Huxley  [facility] Pager [phone] If 8PM-8AM, please contact night-coverage at [email or web address], password [facility] 07/06/2012, 12:23 PM  LOS: 7 days

## 2012-07-06 NOTE — Progress Notes (Signed)
Patient being transferred to 4 [facility] per MD order, Report called to [place], [place]. Patient will transfer in bed. All vital signs WNL.

## 2012-07-06 NOTE — Consult Note (Signed)
Physical Medicine and Rehabilitation Consult Reason for Consult: Left BKA Referring Physician: [facility]   HPI: Tyler Erickson is a 62 y.o. right-handed male with history of diabetes mellitus with peripheral neuropathy, stage III colon cancer status post subtotal colectomy and chemotherapy. Admitted 06/29/2012 with generalized weakness and left foot pain. Patient not seen a doctor last 2 years due to financial issues. Noted progressive ischemic changes left lower extremity. 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 Darrick Penna. Upon arrival to the PACU patient had brief respiratory arrest was intubated received brief CPR one round of Epi. Critical care medicine consulted and followup. Hospital course followup with right upper extremity swelling and weakness and followup per Dr. Amanda Pea and did not feel this was compartment syndrome however he did feel there was radial nerve and medial nerve significant limitations.. There was question of vascular integrity being quite poor possible sepsis maintained on broad-spectrum antibiotics. Carotid Doppler showed no ICA stenosis. Echocardiogram with ejection fraction of 55% no evidence of vegetation. MRI of the brain 07/04/2012 did show small acute infarcts in the right cerebellum and dorsal brainstem. 2 punctate acute infarcts suspected also in the left occipital parietal lobe. MRA of the head negative for any intracranial changes. CT cervical spine multi-factorial cervical stenosis with mild spinal cord mass effect C4-5 and C6-7 no spinal cord signal abnormality. Blood culture showed group A strep bacteremia with followup per infectious disease maintain on ceftriaxone x28 days. Maintained on subcutaneous heparin for DVT prophylaxis. Physical therapy evaluation completed 07/05/2012 with recommendations of physical medicine rehabilitation consult to consider inpatient rehabilitation services   Review of Systems  Cardiovascular: Positive for leg  swelling.  Gastrointestinal: Positive for constipation.  Musculoskeletal: Positive for myalgias.  Neurological: Positive for weakness.  All other systems reviewed and are negative.   Past Medical History  Diagnosis Date  . ADENOCARCINOMA, COLON     S/P surgery/chemotherapy  . DM   . NEUROPATHY   . Kidney stones   . Large bowel obstruction   . Ischemic colon    Past Surgical History  Procedure Date  . 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 Ileostomy closure 10/31/2009  . Porta-cath insertion 08/08/2008  . Amputation 06/30/2012    Procedure: AMPUTATION BELOW KNEE;  Surgeon: Sherren Kerns, MD;  Location: [facility] OR;  Service: Vascular;  Laterality: Left;  . Intraoperative arteriogram 06/30/2012    Procedure: INTRA OPERATIVE ARTERIOGRAM;  Surgeon: Sherren Kerns, MD;  Location: [facility] OR;  Service: Vascular;  Laterality: Right;  . Aortogram 06/30/2012    Procedure: AORTOGRAM;  Surgeon: Sherren Kerns, MD;  Location: [facility] OR;  Service: Vascular;  Laterality: Right;  Right upper extremity arch   . Tee without cardioversion 07/05/2012    Procedure: TRANSESOPHAGEAL ECHOCARDIOGRAM (TEE);  Surgeon: Vesta Mixer, MD;  Location: [facility] ENDOSCOPY;  Service: Cardiovascular;  Laterality: N/A;   Family History  Problem Relation Age of Onset  . Diabetes Mother   . Diabetes Father   . Heart failure Mother   . Heart failure Father   . Stroke Father   . Diabetes Sister   . Diabetes Sister   . Diabetes Brother    Social History:  reports that he has been smoking Cigars.  He has never used smokeless tobacco. He reports that he does not drink alcohol or use illicit drugs. Allergies: No Known Allergies Medications Prior to Admission  Medication Sig Dispense Refill  . HYDROcodone-acetaminophen (NORCO/VICODIN) 5-325 MG per tablet Take 2 tablets by mouth every  6 (six) hours as needed. For pain        Home: Home Living Lives With: Other (Comment) (Brother) Available Help at Discharge: Other  (Comment) (Brother works 10-12 hours shifts) Type of Home: Apartment Home Access: Stairs to enter [employer] of Steps: 4 (one step on walk way) Entrance Stairs-Rails: None Home Layout: One level Bathroom Shower/Tub: Forensic scientist: Standard Bathroom Accessibility: No Home Adaptive Equipment: Bedside commode/3-in-1  Functional History: Prior Function Able to Take Stairs?: Yes Driving: Yes Vocation: On disability Functional Status:  Mobility: Bed Mobility Bed Mobility: Rolling Right;Rolling Left;Supine to Sit;Sitting - Scoot to [place] of Bed;Sit to Supine Rolling Right: 3: Mod assist Rolling Left: 3: Mod assist Supine to Sit: 1: +2 Total assist Supine to Sit: Patient Percentage: 40% Sitting - Scoot to Edge of Bed: 1: +2 Total assist Sitting - Scoot to Edge of Bed: Patient Percentage: 50% Sit to Supine: 1: +2 Total assist Sit to Supine: Patient Percentage: 50% Transfers Transfers: Not assessed Ambulation/Gait Ambulation/Gait Assistance: Not tested (comment) Stairs: No Wheelchair Mobility Wheelchair Mobility: No  ADL:    Cognition: Cognition Arousal/Alertness: Lethargic Orientation Level: Oriented X4 Cognition Overall Cognitive Status: Impaired Area of Impairment: Attention;Problem solving Arousal/Alertness: Lethargic Orientation Level: Appears intact for tasks assessed Behavior During Session: Lethargic Current Attention Level: Sustained Problem Solving: Pt slow to process and easily falls asleep during task  Blood pressure 129/63, pulse 87, temperature 97.4 F (36.3 C), temperature source Oral, resp. rate 30, height 5\' 8"  (1.727 m), weight 69 kg (152 lb 1.9 oz), SpO2 97.00%. Physical Exam  HENT:  Head: Normocephalic.  Eyes:       Pupils round and reactive to light  Neck: Neck supple. No thyromegaly present.  Cardiovascular: Normal rate and regular rhythm.   Pulmonary/Chest: Effort normal and breath sounds normal. He has no  wheezes.  Abdominal: Soft. Bowel sounds are normal. He exhibits no distension.  Neurological: He is alert.       Patient with flat affect. He was able to name person place and date of birth. He followed simple commands  Skin:       Left BKA site is dressed  Musculoskeletal: Right arm has edema in the hand and wrist area.Eschar in the distal wrist radial aspect. Trace deltoid, trace biceps, 2 minus triceps, 0/5 finger extensors, FCU is firing, decreased hand intrinsic function finger abductors as well as thumb abductor and adductor Sensation is reduced in the C5, C6, C7 greater than C8 dermatomes on the right side Mild tremor on the left side otherwise 4/5 strength in all muscle groups in the upper extremity and with normal sensation Right lower remedy has a skin ulcer or over the right first MTP. Motor strength is 4 minus in the hip flexor knee extensor ankle dorsiflexor and plantar flexor  Results for orders placed during the hospital encounter of 06/29/12 (from the past 24 hour(s))  GLUCOSE, CAPILLARY     Status: Abnormal   Collection Time   07/05/12  8:01 AM      Component Value Range   Glucose-Capillary 162 (*) 70 - 99 mg/dL   Comment 1 Documented in Chart     Comment 2 Notify RN    GLUCOSE, CAPILLARY     Status: Abnormal   Collection Time   07/05/12 12:21 PM      Component Value Range   Glucose-Capillary 138 (*) 70 - 99 mg/dL  GLUCOSE, CAPILLARY     Status: Abnormal   Collection Time  07/05/12  4:56 PM      Component Value Range   Glucose-Capillary 257 (*) 70 - 99 mg/dL  GLUCOSE, CAPILLARY     Status: Abnormal   Collection Time   07/05/12  9:32 PM      Component Value Range   Glucose-Capillary 203 (*) 70 - 99 mg/dL   Comment 1 Notify RN     Comment 2 Documented in Chart    BASIC METABOLIC PANEL     Status: Abnormal   Collection Time   07/06/12  4:04 AM      Component Value Range   Sodium 135  135 - 145 mEq/L   Potassium 3.6  3.5 - 5.1 mEq/L   Chloride 100  96 - 112 mEq/L   CO2  27  19 - 32 mEq/L   Glucose, Bld 223 (*) 70 - 99 mg/dL   BUN 44 (*) 6 - 23 mg/dL   Creatinine, Ser 0.98 (*) 0.50 - 1.35 mg/dL   Calcium 6.7 (*) 8.4 - 10.5 mg/dL   GFR calc non Af Amer 54 (*) >90 mL/min   GFR calc Af Amer 63 (*) >90 mL/min  MAGNESIUM     Status: Abnormal   Collection Time   07/06/12  4:04 AM      Component Value Range   Magnesium 1.4 (*) 1.5 - 2.5 mg/dL   Mr Shirlee Latch Wo Contrast  07/04/2012  *RADIOLOGY REPORT*  Clinical Data:  62 year old male with slurred speech, difficulty moving right shoulder.  Recent left lower extremity amputation.  Comparison:  Head CT 07/01/2012.  MRI HEAD WITHOUT CONTRAST MRI CERVICAL SPINE WITHOUT CONTRAST  Technique:  Multiplanar, multiecho pulse sequences of the brain and surrounding structures, and cervical spine, to include the craniocervical junction and cervicothoracic junction, were obtained without intravenous contrast.  MRI HEAD  Findings:  Punctate cortical focus of restricted diffusion in the left parietal lobe, posterior to the sensory strip (series 5 image 23).  No associated T2 or FLAIR hyperintensity.  Possible additional punctate area of restriction in the left occipital lobe white matter (image 13).  There is a small focus of restricted diffusion along the anterior right cerebellar tonsil (series 5 image 5).  There are to other punctate areas of restricted diffusion in the right cerebellum, one involving the right middle cerebellar peduncle (series 5 image 8). Probably also one additional punctate area of restriction in the dorsal medulla also on this image.  Minimal associated T2 and FLAIR hyperintensity.  Diffusion elsewhere is within normal limits. No acute intracranial hemorrhage identified.  No midline shift, mass effect, or evidence of mass lesion.  No ventriculomegaly. Major intracranial vascular flow voids are preserved, MRA findings are below.  Outside of the above findings, there is only mild for age scattered cerebral white matter T2  and FLAIR hyperintensity, mostly subcortical.  Negative pituitary cervicomedullary junction. Cervical spine findings are below.  Visualized orbit soft tissues are within normal limits.  Trace mastoid effusions.  Negative visualized nasopharynx. Mild maxillary and ethmoid sinus mucosal thickening.  Negative scalp soft tissues.  IMPRESSION: 1. 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  Two punctate acute infarcts suspected also in the left occipital and parietal lobe. No mass effect or hemorrhage. Given the distribution, embolic phenomena to the posterior circulation is possible.  The supratentorial findings might also reflect synchronous small vessel disease. 2.  No other acute intracranial abnormality and otherwise largely unremarkable for age MRI appearance of the brain.  3.  Cervical spine and MRA findings are below.  MRI CERVICAL SPINE  Findings: Preserved cervical lordosis. No marrow edema or evidence of acute osseous abnormality.  Subcutaneous edema in the posterior paraspinal soft tissues, possible mild involvement of the superficial aspect of the underlying erector spinae muscles.  This may be related to prolonged bedrest, the conditioning. Other Visualized paraspinal soft tissues are within normal limits.  Cervicomedullary junction is within normal limits.  Despite spinal stenosis, described below, no cervical spinal cord signal abnormality.  C2-C3:  Negative.  C3-C4:  Mild circumferential disc bulge.  Mild uncovertebral hypertrophy.  Mild bilateral C4 foraminal stenosis.  C4-C5:  Circumferential disc osteophyte complex.  Broad-based central right paracentral posterior component of disc.  Spinal stenosis with mild spinal cord flattening.  Mild left C5 foraminal stenosis.  C5-C6:  Circumferential disc osteophyte complex.  Moderate ligament flavum hypertrophy.  Broad-based posterior component of disc. Spinal stenosis with mild spinal cord flattening.  Mild to moderate bilateral C6  foraminal stenosis.  C6-C7:  Circumferential disc osteophyte complex.  Broad-based left eccentric posterior component of disc.  Moderate ligament flavum hypertrophy.  Spinal stenosis with minimal spinal cord mass effect. Moderate to severe left and mild right C7 foraminal stenosis.  C7-T1:  Mild facet hypertrophy.  Mild left greater than right C8 foraminal stenosis.  Negative visualized upper thoracic levels.  IMPRESSION: 1.  Multifactorial cervical spinal stenosis with mild spinal cord mass effect from C4-C5 to C6-C7.  No spinal cord signal abnormality. 2.  Moderate to severe left C7 foraminal stenosis related to disc and endplate changes. Mild to moderate multifactorial bilateral C6 foraminal stenosis. 3.  MRA findings are below.  MRA HEAD WITHOUT CONTRAST  Technique:  Angiographic images of the Circle of Willis were obtained using MRA technique without intravenous contrast.  Findings:  Antegrade flow in the posterior circulation.  Codominant distal vertebral arteries.  Normal right PICA.  Normal vertebrobasilar junction.  Prominent right AICA also noted.  No left PICA or AICA origin identified. Subsequently, the left SCA might be dominant.  No basilar stenosis.  SCA and PCA origins are normal.  Diminutive posterior communicating arteries.  Bilateral PCA branches are within normal limits.  Antegrade flow in both ICA siphons.  Mild supraclinoid ICA irregularity.  No focal ICA stenosis.  Patent carotid termini.  MCA and ACA origins are within normal limits.  Diminutive or absent anterior communicating artery.  Visualized ACA branches are within normal limits.  Visualized bilateral MCA branches are within normal limits.  IMPRESSION: Negative intracranial MRA.   Original Report Authenticated By: Erskine Speed, M.D.    Mr Brain Wo Contrast  07/04/2012  *RADIOLOGY REPORT*  Clinical Data:  62 year old male with slurred speech, difficulty moving right shoulder.  Recent left lower extremity amputation.  Comparison:  Head  CT 07/01/2012.  MRI HEAD WITHOUT CONTRAST MRI CERVICAL SPINE WITHOUT CONTRAST  Technique:  Multiplanar, multiecho pulse sequences of the brain and surrounding structures, and cervical spine, to include the craniocervical junction and cervicothoracic junction, were obtained without intravenous contrast.  MRI HEAD  Findings:  Punctate cortical focus of restricted diffusion in the left parietal lobe, posterior to the sensory strip (series 5 image 23).  No associated T2 or FLAIR hyperintensity.  Possible additional punctate area of restriction in the left occipital lobe white matter (image 13).  There is a small focus of restricted diffusion along the anterior right cerebellar tonsil (series 5 image 5).  There are to other punctate areas of restricted diffusion in the right  cerebellum, one involving the right middle cerebellar peduncle (series 5 image 8). Probably also one additional punctate area of restriction in the dorsal medulla also on this image.  Minimal associated T2 and FLAIR hyperintensity.  Diffusion elsewhere is within normal limits. No acute intracranial hemorrhage identified.  No midline shift, mass effect, or evidence of mass lesion.  No ventriculomegaly. Major intracranial vascular flow voids are preserved, MRA findings are below.  Outside of the above findings, there is only mild for age scattered cerebral white matter T2 and FLAIR hyperintensity, mostly subcortical.  Negative pituitary cervicomedullary junction. Cervical spine findings are below.  Visualized orbit soft tissues are within normal limits.  Trace mastoid effusions.  Negative visualized nasopharynx. Mild maxillary and ethmoid sinus mucosal thickening.  Negative scalp soft tissues.  IMPRESSION: 1. 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  Two punctate acute infarcts suspected also in the left occipital and parietal lobe. No mass effect or hemorrhage. Given the distribution, embolic phenomena to the posterior  circulation is possible.  The supratentorial findings might also reflect synchronous small vessel disease. 2.  No other acute intracranial abnormality and otherwise largely unremarkable for age MRI appearance of the brain.  3.  Cervical spine and MRA findings are below.  MRI CERVICAL SPINE  Findings: Preserved cervical lordosis. No marrow edema or evidence of acute osseous abnormality.  Subcutaneous edema in the posterior paraspinal soft tissues, possible mild involvement of the superficial aspect of the underlying erector spinae muscles.  This may be related to prolonged bedrest, the conditioning. Other Visualized paraspinal soft tissues are within normal limits.  Cervicomedullary junction is within normal limits.  Despite spinal stenosis, described below, no cervical spinal cord signal abnormality.  C2-C3:  Negative.  C3-C4:  Mild circumferential disc bulge.  Mild uncovertebral hypertrophy.  Mild bilateral C4 foraminal stenosis.  C4-C5:  Circumferential disc osteophyte complex.  Broad-based central right paracentral posterior component of disc.  Spinal stenosis with mild spinal cord flattening.  Mild left C5 foraminal stenosis.  C5-C6:  Circumferential disc osteophyte complex.  Moderate ligament flavum hypertrophy.  Broad-based posterior component of disc. Spinal stenosis with mild spinal cord flattening.  Mild to moderate bilateral C6 foraminal stenosis.  C6-C7:  Circumferential disc osteophyte complex.  Broad-based left eccentric posterior component of disc.  Moderate ligament flavum hypertrophy.  Spinal stenosis with minimal spinal cord mass effect. Moderate to severe left and mild right C7 foraminal stenosis.  C7-T1:  Mild facet hypertrophy.  Mild left greater than right C8 foraminal stenosis.  Negative visualized upper thoracic levels.  IMPRESSION: 1.  Multifactorial cervical spinal stenosis with mild spinal cord mass effect from C4-C5 to C6-C7.  No spinal cord signal abnormality. 2.  Moderate to severe left  C7 foraminal stenosis related to disc and endplate changes. Mild to moderate multifactorial bilateral C6 foraminal stenosis. 3.  MRA findings are below.  MRA HEAD WITHOUT CONTRAST  Technique:  Angiographic images of the Circle of Willis were obtained using MRA technique without intravenous contrast.  Findings:  Antegrade flow in the posterior circulation.  Codominant distal vertebral arteries.  Normal right PICA.  Normal vertebrobasilar junction.  Prominent right AICA also noted.  No left PICA or AICA origin identified. Subsequently, the left SCA might be dominant.  No basilar stenosis.  SCA and PCA origins are normal.  Diminutive posterior communicating arteries.  Bilateral PCA branches are within normal limits.  Antegrade flow in both ICA siphons.  Mild supraclinoid ICA irregularity.  No focal ICA stenosis.  Patent carotid termini.  MCA and ACA origins are within normal limits.  Diminutive or absent anterior communicating artery.  Visualized ACA branches are within normal limits.  Visualized bilateral MCA branches are within normal limits.  IMPRESSION: Negative intracranial MRA.   Original Report Authenticated By: Erskine Speed, M.D.    Mr Cervical Spine Wo Contrast  07/04/2012  *RADIOLOGY REPORT*  Clinical Data:  62 year old male with slurred speech, difficulty moving right shoulder.  Recent left lower extremity amputation.  Comparison:  Head CT 07/01/2012.  MRI HEAD WITHOUT CONTRAST MRI CERVICAL SPINE WITHOUT CONTRAST  Technique:  Multiplanar, multiecho pulse sequences of the brain and surrounding structures, and cervical spine, to include the craniocervical junction and cervicothoracic junction, were obtained without intravenous contrast.  MRI HEAD  Findings:  Punctate cortical focus of restricted diffusion in the left parietal lobe, posterior to the sensory strip (series 5 image 23).  No associated T2 or FLAIR hyperintensity.  Possible additional punctate area of restriction in the left occipital lobe white  matter (image 13).  There is a small focus of restricted diffusion along the anterior right cerebellar tonsil (series 5 image 5).  There are to other punctate areas of restricted diffusion in the right cerebellum, one involving the right middle cerebellar peduncle (series 5 image 8). Probably also one additional punctate area of restriction in the dorsal medulla also on this image.  Minimal associated T2 and FLAIR hyperintensity.  Diffusion elsewhere is within normal limits. No acute intracranial hemorrhage identified.  No midline shift, mass effect, or evidence of mass lesion.  No ventriculomegaly. Major intracranial vascular flow voids are preserved, MRA findings are below.  Outside of the above findings, there is only mild for age scattered cerebral white matter T2 and FLAIR hyperintensity, mostly subcortical.  Negative pituitary cervicomedullary junction. Cervical spine findings are below.  Visualized orbit soft tissues are within normal limits.  Trace mastoid effusions.  Negative visualized nasopharynx. Mild maxillary and ethmoid sinus mucosal thickening.  Negative scalp soft tissues.  IMPRESSION: 1. 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  Two punctate acute infarcts suspected also in the left occipital and parietal lobe. No mass effect or hemorrhage. Given the distribution, embolic phenomena to the posterior circulation is possible.  The supratentorial findings might also reflect synchronous small vessel disease. 2.  No other acute intracranial abnormality and otherwise largely unremarkable for age MRI appearance of the brain.  3.  Cervical spine and MRA findings are below.  MRI CERVICAL SPINE  Findings: Preserved cervical lordosis. No marrow edema or evidence of acute osseous abnormality.  Subcutaneous edema in the posterior paraspinal soft tissues, possible mild involvement of the superficial aspect of the underlying erector spinae muscles.  This may be related to prolonged  bedrest, the conditioning. Other Visualized paraspinal soft tissues are within normal limits.  Cervicomedullary junction is within normal limits.  Despite spinal stenosis, described below, no cervical spinal cord signal abnormality.  C2-C3:  Negative.  C3-C4:  Mild circumferential disc bulge.  Mild uncovertebral hypertrophy.  Mild bilateral C4 foraminal stenosis.  C4-C5:  Circumferential disc osteophyte complex.  Broad-based central right paracentral posterior component of disc.  Spinal stenosis with mild spinal cord flattening.  Mild left C5 foraminal stenosis.  C5-C6:  Circumferential disc osteophyte complex.  Moderate ligament flavum hypertrophy.  Broad-based posterior component of disc. Spinal stenosis with mild spinal cord flattening.  Mild to moderate bilateral C6 foraminal stenosis.  C6-C7:  Circumferential disc osteophyte complex.  Broad-based left eccentric  posterior component of disc.  Moderate ligament flavum hypertrophy.  Spinal stenosis with minimal spinal cord mass effect. Moderate to severe left and mild right C7 foraminal stenosis.  C7-T1:  Mild facet hypertrophy.  Mild left greater than right C8 foraminal stenosis.  Negative visualized upper thoracic levels.  IMPRESSION: 1.  Multifactorial cervical spinal stenosis with mild spinal cord mass effect from C4-C5 to C6-C7.  No spinal cord signal abnormality. 2.  Moderate to severe left C7 foraminal stenosis related to disc and endplate changes. Mild to moderate multifactorial bilateral C6 foraminal stenosis. 3.  MRA findings are below.  MRA HEAD WITHOUT CONTRAST  Technique:  Angiographic images of the Circle of Willis were obtained using MRA technique without intravenous contrast.  Findings:  Antegrade flow in the posterior circulation.  Codominant distal vertebral arteries.  Normal right PICA.  Normal vertebrobasilar junction.  Prominent right AICA also noted.  No left PICA or AICA origin identified. Subsequently, the left SCA might be dominant.  No  basilar stenosis.  SCA and PCA origins are normal.  Diminutive posterior communicating arteries.  Bilateral PCA branches are within normal limits.  Antegrade flow in both ICA siphons.  Mild supraclinoid ICA irregularity.  No focal ICA stenosis.  Patent carotid termini.  MCA and ACA origins are within normal limits.  Diminutive or absent anterior communicating artery.  Visualized ACA branches are within normal limits.  Visualized bilateral MCA branches are within normal limits.  IMPRESSION: Negative intracranial MRA.   Original Report Authenticated By: Erskine Speed, M.D.     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 Does the need for close, 24 hr/day medical supervision in concert with the patient's rehab needs make it unreasonable for this patient to be served in a less intensive setting? Yes 2. Co-Morbidities requiring supervision/potential complications: Cervical spinal stenosis, diabetes, history of colon adenocarcinoma, diabetic foot ulcer, recent septic shock still requiring IV antibiotics 3. Due to bladder management, bowel management, safety, skin/wound care, disease management, medication administration, pain management and patient education, does the patient require 24 hr/day rehab nursing? Yes 4. Does the patient require coordinated care of a physician, rehab nurse, PT (1-2 hrs/day, 5 days/week), OT (1-2 hrs/day, 5 days/week) and SLP (0.5-1 hrs/day, 3 days/week) to address physical and functional deficits in the context of the above medical diagnosis(es)? Yes Addressing deficits in the following areas: balance, endurance, locomotion, strength, transferring, bowel/bladder control, bathing, dressing, feeding, grooming, toileting and cognition 5. Can the patient actively participate in an intensive therapy program of at least 3 hrs of therapy per day at least 5 days per week?  Yes 6. The potential for patient to make measurable gains while on inpatient rehab is good 7. Anticipated functional outcomes upon discharge from inpatient rehab are Min assist wheelchair level with mobility with PT, Min assist ADLs with OT, Eval cognition higher-level with SLP. 8. Estimated rehab length of stay to reach the above functional goals is: 2 weeks 9. Does the patient have adequate social supports to accommodate these discharge functional goals? Potentially 10. Anticipated D/C setting: Home if family is able to provide 24 7 min assist care otherwise may need to go to SNF 11. Anticipated post D/C treatments: HH therapy 12. Overall Rehab/Functional Prognosis: good  RECOMMENDATIONS: This patient's condition is appropriate for continued rehabilitative care in the following setting: CIR Patient has agreed to participate in recommended program. Yes Note that insurance prior authorization may  be required for reimbursement for recommended care.  Comment:May need to go to SNF, inpatient rehabilitation may reduce the burden of care    07/06/2012

## 2012-07-06 NOTE — Progress Notes (Signed)
Inpatient Diabetes Program Recommendations  AACE/ADA: New Consensus Statement on Inpatient Glycemic Control (2013)  Target Ranges:  Prepandial:   less than 140 mg/dL      Peak postprandial:   less than 180 mg/dL (1-2 hours)      Critically ill patients:  140 - 180 mg/dL    Results for MUSSA, GROESBECK (MRN [id number]) as of 07/06/2012 10:57  Ref. Range 07/05/2012 08:01 07/05/2012 12:21 07/05/2012 16:56 07/05/2012 21:32  Glucose-Capillary Latest Range: 70-99 mg/dL 409 (H) 811 (H) 914 (H) 203 (H)   Noted Lantus increased to 14 units daily today.    Noted patient to either go to Inpatient Rehab (CIR) or SNF for rehab after discharge.    Based on patient's A1c of 10.9% (07/05/12) and patient's response to insulin in the hospital, would probably be best to send patient to SNF or CIR on insulin.  If he goes to CIR, the Inpatient DM Program along with the Rehab nurses can make sure patient gets the proper education on insulin for home (once ready for d/c home).  If patient goes to SNF, the nurses in the SNF will assist patient with insulin.  Note: Will follow closely. Ambrose Finland RN, MSN, CDE Diabetes Coordinator Inpatient Diabetes Program [phone]

## 2012-07-06 NOTE — Clinical Social Work Psychosocial (Signed)
     Clinical Social Work Department BRIEF PSYCHOSOCIAL ASSESSMENT 07/06/2012  Patient:  Tyler Erickson, Tyler Erickson     Account Number:  [id number]     Admit date:  06/29/2012  Clinical Social Worker:  Lourdes Sledge  Date/Time:  07/06/2012 09:45 AM  Referred by:  CSW  Date Referred:  07/06/2012 Referred for  SNF Placement   Other Referral:   Interview type:  Patient Other interview type:   CSW also completed assessment with pt sister Tyler Erickson [phone] while in pt room.    PSYCHOSOCIAL DATA Living Status:  SIBLING Admitted from facility:   Level of care:   Primary support name:  Tyler Erickson [phone] Primary support relationship to patient:  SIBLING Degree of support available:   Pt has supportive sisters Tyler Erickson and Tyler Erickson and brother who pt lives with. Pt sister Tyler Erickson present in pt room.    CURRENT CONCERNS Current Concerns  Post-Acute Placement   Other Concerns:    SOCIAL WORK ASSESSMENT / PLAN CSW observed that PT is recommending CIR for pt at dc.    CSW visited pt room to discuss SNF placement as a back-up option. CSW observed that pt appeared sleepy. CSW spoke with pt briefly regarding SNF placement and completed assessment with pt sister Tyler Erickson per pt request. Pt informed CSW that he was agreeable to SNF and requested CSW discuss plans with his sisters.    CSW explored pt living situation and amount of support pt has at home after dc. Pt sister Tyler Erickson informed CSW that pt lives with his brother however brother works fulltime and is unable to provide 24hr care. Pt sister Tyler Erickson stated she too is agreeable to SNF placement if CIR is unable to offer placement. Tyler Erickson is requesting a SNF search for [facility] and [facility].   Assessment/plan status:  Psychosocial Support/Ongoing Assessment of Needs Other assessment/ plan:   Information/referral to community resources:   CSW provided pt and sister with a SNF list as well as CSW contact information.     PATIENTS/FAMILYS RESPONSE TO PLAN OF CARE: Pt laying in bed semi-alert and appeared oriented when awake. Pt requesting dc plans to be discussed with his siblings. Pt has agreed to SNF placement.

## 2012-07-06 NOTE — Progress Notes (Signed)
Stroke Team Progress Note  HISTORY 62 y.o. male with a PMH of DM, diabetic neuropathy, stage III colon cancer status post subtotal colectomy and chemotherapy (11 cycles of FOLFOX) who presents to the ER 06/29/2012 with generalized weakness and left foot pain. He has not seen a doctor in over 2 years due to financial issues. The patient states his LLE has been discolored and tender for about a week. No fever, but reports subjective chills. He has not checked his glucoses in 2 years. Pt found to have L foot gangrene and sepsis with pos. Blood cx. S/P L BKA, s/p extubation. 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 Pt s/p MRI found to have multiple b/l watershed infarcts. Patient was not a TPA candidate secondary to unknown time of onset. He was admitted for further evaluation and treatment.  SUBJECTIVE Patient without complaints.  OBJECTIVE Most recent Vital Signs: Filed Vitals:   07/06/12 0000 07/06/12 0400 07/06/12 0500 07/06/12 0801  BP: 136/51 129/63    Pulse: 85 87    Temp: 97.7 F (36.5 C) 97.4 F (36.3 C)  98.7 F (37.1 C)  TempSrc: Oral Oral  Axillary  Resp: 25 30    Height:      Weight:   69 kg (152 lb 1.9 oz)   SpO2: 96% 97%     CBG (last 3)   Basename 07/06/12 0804 07/05/12 2132 07/05/12 1656  GLUCAP 197* 203* 257*   IV Fluid Intake:    . sodium chloride 100 mL/hr at 07/05/12 2143   MEDICATIONS    . antiseptic oral rinse  15 mL Mouth Rinse BID  . aspirin  325 mg Oral Daily  . cefTRIAXone (ROCEPHIN)  IV  2 g Intravenous Q24H  . feeding supplement  237 mL Oral BID BM  . feeding supplement  30 mL Oral TID WC  . heparin subcutaneous  5,000 Units Subcutaneous Q8H  . insulin aspart  0-15 Units Subcutaneous TID WC  . insulin glargine  14 Units Subcutaneous Daily  . potassium chloride  40 mEq Oral BID  . saccharomyces boulardii  250 mg Oral BID  . sodium chloride  3 mL Intravenous Q12H   PRN:  acetaminophen,  albuterol, HYDROmorphone (DILAUDID) injection, ondansetron (ZOFRAN) IV, oxyCODONE  Diet:  Dysphagia 3 thin Activity:  Up in chair DVT Prophylaxis:  Heparin 5000 units sq tid  CLINICALLY SIGNIFICANT STUDIES Basic Metabolic Panel:   Lab 07/06/12 0404 07/05/12 0415 07/02/12 0458 07/01/12 0635  NA 135 137 -- --  K 3.6 2.8* -- --  CL 100 97 -- --  CO2 27 30 -- --  GLUCOSE 223* 188* -- --  BUN 44* 62* -- --  CREATININE 1.38* 1.93* -- --  CALCIUM 6.7* 7.5* -- --  MG 1.4* -- 2.4 --  PHOS -- -- 2.8 1.9*   Liver Function Tests:   Lab 07/03/12 0719 07/02/12 0458  AST 73* 91*  ALT 31 30  ALKPHOS 152* 73  BILITOT 2.6* 2.6*  PROT 5.9* 5.0*  ALBUMIN 0.9* 0.9*   CBC:   Lab 07/05/12 0415 07/03/12 0717  WBC 8.7 15.7*  NEUTROABS -- --  HGB 9.6* 9.4*  HCT 27.8* 26.9*  MCV 81.5 83.3  PLT 86* 85*   Coagulation:   Lab 06/30/12 0935 06/29/12 1420  LABPROT 16.8* 16.9*  INR 1.40 1.41   Cardiac Enzymes:   Lab 06/30/12 0609  CKTOTAL --  CKMB --  CKMBINDEX --  TROPONINI <0.30   Urinalysis:  Lab 06/30/12 0928 06/29/12 1601  COLORURINE AMBER* AMBER*  LABSPEC 1.027 1.026  PHURINE 5.0 5.0  GLUCOSEU 100* >1000*  HGBUR LARGE* LARGE*  BILIRUBINUR SMALL* SMALL*  KETONESUR 15* TRACE*  PROTEINUR 100* 100*  UROBILINOGEN 1.0 1.0  NITRITE NEGATIVE NEGATIVE  LEUKOCYTESUR NEGATIVE NEGATIVE   Lipid Panel No results found for this basename: chol,  trig,  hdl,  cholhdl,  vldl,  ldlcalc   HgbA1C  Lab Results  Component Value Date   HGBA1C 10.9* 07/05/2012   Urine Drug Screen:   No results found for this basename: labopia,  cocainscrnur,  labbenz,  amphetmu,  thcu,  labbarb    Alcohol Level: No results found for this basename: ETH:2 in the last 168 hours  CT of the brain    MRI of the brain  07/04/2012   1. 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  Two punctate acute infarcts suspected also in the left occipital and parietal lobe. No mass effect or  hemorrhage. Given the distribution, embolic phenomena to the posterior circulation is possible.  The supratentorial findings might also reflect synchronous small vessel disease. 2.  No other acute intracranial abnormality and otherwise largely unremarkable for age MRI appearance of the brain.  MRA of the brain  07/04/2012 Negative intracranial MRA.    MRI Cervical Spine 07/04/2012  1.  Multifactorial cervical spinal stenosis with mild spinal cord mass effect from C4-C5 to C6-C7.  No spinal cord signal abnormality. 2.  Moderate to severe left C7 foraminal stenosis related to disc and endplate changes. Mild to moderate multifactorial bilateral C6 foraminal stenosis.   2D Echocardiogram  No vegetations that are large are noted, but study is suboptimal to exclude.  TEE no source of embolus  Carotid Doppler  No evidence of hemodynamically significant internal carotid artery stenosis. Vertebral artery flow is antegrade.   CXR  07/02/2012 Tip of endotracheal tube now 2.0 cm above carina. Remainder exam unchanged.  EKG  Atrial paced complexes.   Therapy Recommendations CIR  Physical Exam   Frail middle aged Caucasian male not in distress. Has left BKA with bandage. Rt forearm has bandage.Awake alert. Afebrile. Head is nontraumatic. Neck is supple without bruit. Hearing is normal. Cardiac exam no murmur or gallop. Lungs are clear to auscultation.  Neurological Exam ; awake alert oriented x2 with diminished attention and short-term memory. Follows commands well. No aphasia or dysarthria. Extraocular moments are full range without nystagmus. Is mild right lower facial symmetry. Fundi were not visualized. Vision acuity and fields appear adequate. No upper or lower expected drift but mild weakness of the right grip and intrinsic hand muscles as well as right hip flexors and ankle dorsiflexors 4/5. Left lower extremity exam limited do to amputation. The right upper extremity strength testing limited do to pain and  bandage and the forearm. Gait was not tested  ASSESSMENT [name] is a 62 y.o. male presenting with generalized weakness and right hand weakness/numbness that occurred prior to admission. Imaging confirms bilateral posterior circulaltion infarcts. Infarcts felt to be embolic secondary to unknown etiology, TEE negative for endocarditis.  Inpatient work up completed. On no antiplatelets prior to admission. Now on aspirin 325 mg orally every day for secondary stroke prevention. Patient with resultant right arm hemiparesis, greater in the hand .   Colon adenocarcinoma, stage III, status post subtotal colectomy and chemotherapy Diabetes, HgbA1c 10.1 LBKA, post op D5 due to gangrene of foot Group A strep bacteremia,septic shock, on Rocephin  x 6 wks  Hospital day # 7  TREATMENT/PLAN  Continue aspirin 325 mg orally every day for secondary stroke prevention.  Ongoing risk factor control  F/u lipid panel. Add statin if LDL >/= to 100. This is a Artist stroke requirement Will need an outpatient TCD bubble study with emboli monitoring with Dr. Pearlean Brownie in 1 month to further evaluate for possible PFO. Have patient call for appointment. (I have added to discharge instruction sheet). Please schedule outpatient telemetry monitoring to assess patient for atrial fibrillation as source of stroke. May be arranged with patient's cardiologist, or cardiologist of choice.  Agree with Rehab consult  Annie Main, MSN, RN, ANVP-BC, ANP-BC, GNP-BC [facility] Pager: [phone] 07/06/2012 8:21 AM  I have personally obtained a history, examined the patient, evaluated imaging results, and formulated the assessment and plan of care. I agree with the above.   Delia Heady, MD Medical Director [facility] Pager: [phone] 07/06/2012 8:21 AM

## 2012-07-06 NOTE — Progress Notes (Signed)
Rehab Admissions Coordinator Note:  Patient was screened by Trish Mage for appropriateness for an Inpatient Acute Rehab Consult.  At this time, I am deferring the prescreen.  An inpatient rehab consult has already been ordered and is pending completion today.  Trish Mage 07/06/2012, 9:29 AM  I can be reached at [phone].

## 2012-07-06 NOTE — Evaluation (Addendum)
Occupational Therapy Evaluation Patient Details Name: Tyler Erickson MRN: [id number] DOB: [birth year] Today's Date: 07/06/2012 Time: 4098-1191 OT Time Calculation (min): 23 min  OT Assessment / Plan / Recommendation Clinical Impression  62 yo male admitted with PMH of DM, diabetic neuropathy, stage III colon cancer status post subtotal colectomy and chemotherapy (11 cycles of FOLFOX) who presents to the ER 06/29/2012 with generalized weakness and left foot pain. He has not seen a doctor in over 2 years due to financial issues. The patient states his LLE has been discolored and tender for about a week. No fever, but reports subjective chills. He has not checked his glucoses in 2 years. Pt found to have L foot gangrene and sepsis with pos. Blood cx. S/P L BKA, s/p extubation. 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 Pt s/p MRI found to have multiple b/l watershed infarcts. MRI cervical spine demonstrated multifactorial cervical spinal stenosis with mild spinal cord mass effect from C4-C5 to C6-C7 without spinal cord signal abnormality in addition there was moderate to severe left C7 foraminal stenosis related to disc and endplate changes Patient was not a TPA candidate secondary to unknown time of onset. Limited OT evaluation due void of bowel and flexiseal being addressed by RN at end of evaluation. Pt with Rt UE weakness, tone and edema.     OT Assessment  Patient needs continued OT Services    Follow Up Recommendations  CIR    Barriers to Discharge      Equipment Recommendations  Wheelchair cushion (measurements OT);Wheelchair (measurements OT);3 in 1 bedside comode    Recommendations for Other Services Rehab consult  Frequency  Min 2X/week    Precautions / Restrictions Precautions Precautions: Fall, BKA shrinker on Lt BKA, flexiseal, foley   Pertinent Vitals/Pain Stomach discomfort with + stool  No pain reported in left LE     ADL  Lower Body Bathing: +2 Total assistance Lower Body Bathing: Patient Percentage: 50% Where Assessed - Lower Body Bathing: Supine, head of bed flat Upper Body Dressing: Minimal assistance Where Assessed - Upper Body Dressing: Supine, head of bed up Transfers/Ambulation Related to ADLs: not appropriate at this time ADL Comments: pt with foley and flexiseal. pt currently verbalizes need for hygiene. Pt found to have flexiseal leaking. Pt completed bed mobility Rt and Lt to perform bath and hygiene. RN called to room s/p bath to address flexiseal. Pt currently with tone present in Rt UE blisters and decreased AROM. Pt with blisters on Lt UE at elbow with edema. Pt with bil UE edema present.    OT Diagnosis: Generalized weakness;Acute pain  OT Problem List: Decreased strength;Decreased range of motion;Decreased activity tolerance;Impaired balance (sitting and/or standing);Decreased safety awareness;Decreased knowledge of use of DME or AE;Decreased knowledge of precautions;Pain;Impaired UE functional use OT Treatment Interventions: Self-care/ADL training;Neuromuscular education;Therapeutic activities;Patient/family education;Balance training   OT Goals Acute Rehab OT Goals OT Goal Formulation: With patient Time For Goal Achievement: 07/20/12 Potential to Achieve Goals: Good ADL Goals Pt Will Perform Grooming: with min assist;Sitting, chair;Supported ADL Goal: Grooming - Progress: Goal set today Pt Will Perform Upper Body Bathing: with min assist;Sitting, chair;Supported ADL Goal: Upper Body Bathing - Progress: Goal set today Pt Will Perform Upper Body Dressing: with set-up;Sitting, chair;Supported ADL Goal: Patent attorney - Progress: Goal set today Pt Will Transfer to Toilet: with max assist;with DME;3-in-1 ADL Goal: Toilet Transfer - Progress: Goal set today Miscellaneous OT Goals Miscellaneous OT Goal #1: Pt will sit EOB ~  5 minutes Min (A) as precursor to adls OT Goal:  Miscellaneous Goal #1 - Progress: Goal set today Miscellaneous OT Goal #2: Pt will complete bed mobility supine<>sit Min (A) as precursor to adls OT Goal: Miscellaneous Goal #2 - Progress: Goal set today  Visit Information  Last OT Received On: 07/06/12 Assistance Needed: +2    Subjective Data  Subjective: "I know I am going to have to be cleaned up" Patient Stated Goal: to get this stomach stuff under control   Prior Functioning     Home Living Lives With:  (brother) Available Help at Discharge:  (brother works 10-12 shifts) Type of Home: Apartment Home Access: Stairs to enter Secretary/administrator of Steps: 4 Entrance Stairs-Rails: None Home Layout: One level Bathroom Shower/Tub: Forensic scientist: Standard Bathroom Accessibility: No Home Adaptive Equipment: Bedside commode/3-in-1 Prior Function Level of Independence: Independent Able to Take Stairs?: Yes Driving: Yes Vocation: On disability Communication Communication: No difficulties Dominant Hand: Right         Vision/Perception     Cognition  Overall Cognitive Status: Appears within functional limits for tasks assessed/performed Arousal/Alertness: Awake/alert Orientation Level: Appears intact for tasks assessed Behavior During Session: [facility] for tasks performed    Extremity/Trunk Assessment Right Upper Extremity Assessment RUE ROM/Strength/Tone: Deficits RUE ROM/Strength/Tone Deficits: Decreased AROm to Rt UE. shoulder flexion AROm ~30 degrees  PROM shoulder flexion ~80 degree. No AROM wrist. PROM wrist flexion. PROM to neutral unable to perform wrist extension. AAROM elbow ~50 degrees  noted to have wound at wrist covered by pink bandage. Pt no AROM digits RUE Sensation: Deficits RUE Coordination: Deficits Left Upper Extremity Assessment LUE ROM/Strength/Tone: Deficits LUE ROM/Strength/Tone Deficits: shoulder flexion AROM ~30 degree, shoulder abduction AROM ~20 degrees, elbow  flexion and extension ~90 degrees, decreased AROM digits LUE Coordination: Deficits     Mobility Bed Mobility Rolling Right: With rail;4: Min assist Rolling Left: 4: Min assist;With rail Details for Bed Mobility Assistance: pt with large void of bowel and required total +3 for hygiene and change bed linens with flexiseal intact. Transfers Transfers: Not assessed     Shoulder Instructions     Exercise     Balance     End of Session OT - End of Session Activity Tolerance: Patient tolerated treatment well Patient left: in bed;with call bell/phone within reach;with nursing in room Nurse Communication: Precautions  GO     Lucile Shutters 07/06/2012, 2:40 PM Pager: [phone]

## 2012-07-06 NOTE — Progress Notes (Signed)
Lipid Panel     Component Value Date/Time   CHOL 76 07/06/2012 0930   TRIG 194* 07/06/2012 0930   HDL 10* 07/06/2012 0930   CHOLHDL 7.6 07/06/2012 0930   VLDL 39 07/06/2012 0930   LDLCALC 27 07/06/2012 0930    No indication for lipid treatment from stroke standpoint.  Nothing further to add from the stroke standpoint. Will signoff. Followup with Dr. Pearlean Brownie in 2 months.  Annie Main, MSN, RN, ANVP-BC, ANP-BC, GNP-BC [facility] Pager: [phone] 07/06/2012 4:50 PM

## 2012-07-06 NOTE — Progress Notes (Signed)
*  PRELIMINARY RESULTS* Vascular Ultrasound Left upper extremity venous duplex has been completed.  Preliminary findings: Left = no evidence of deep or superificial thrombosis.  Farrel Demark, RDMS, RVT  07/06/2012, 1:14 PM

## 2012-07-06 NOTE — Progress Notes (Signed)
[facility] [place]  Postoperative Visit - Amputation  Date of Surgery: 06/29/2012 - 07/05/2012 Procedure(s): TRANSESOPHAGEAL ECHOCARDIOGRAM (TEE) Left Surgeon: Surgeon(s): Vesta Mixer, MD POD: 1 Day Post-Op  Subjective Tyler Erickson is a 62 y.o. male who is S/P Left Procedure(s): TRANSESOPHAGEAL ECHOCARDIOGRAM (TEE).  Pt.denies increased pain in the stump. The patient notes pain is well controlled. Pt. denies phantom pain.  His biggest complaint is the left arm pain.    Significant Diagnostic Studies: CBC Lab Results  Component Value Date   WBC 8.7 07/05/2012   HGB 9.6* 07/05/2012   HCT 27.8* 07/05/2012   MCV 81.5 07/05/2012   PLT 86* 07/05/2012    BMET    Component Value Date/Time   NA 135 07/06/2012 0404   K 3.6 07/06/2012 0404   CL 100 07/06/2012 0404   CO2 27 07/06/2012 0404   GLUCOSE 223* 07/06/2012 0404   BUN 44* 07/06/2012 0404   CREATININE 1.38* 07/06/2012 0404   CALCIUM 6.7* 07/06/2012 0404   GFRNONAA 54* 07/06/2012 0404   GFRAA 63* 07/06/2012 0404    COAG Lab Results  Component Value Date   INR 1.40 06/30/2012   INR 1.41 06/29/2012   INR 1.07 10/28/2009   No results found for this basename: PTT     Intake/Output Summary (Last 24 hours) at 07/06/12 0745 Last data filed at 07/06/12 0600  Gross per 24 hour  Intake 1907.25 ml  Output   2675 ml  Net -767.75 ml   Patient Vitals for the past 24 hrs:  Stool Color  07/05/12 0900 Brown     Physical Examination  BP Readings from Last 3 Encounters:  07/06/12 129/63  07/06/12 129/63  07/06/12 129/63   Temp Readings from Last 3 Encounters:  07/06/12 97.4 F (36.3 C) Oral  07/06/12 97.4 F (36.3 C) Oral  07/06/12 97.4 F (36.3 C) Oral   SpO2 Readings from Last 3 Encounters:  07/06/12 97%  07/06/12 97%  07/06/12 97%   Pulse Readings from Last 3 Encounters:  07/06/12 87  07/06/12 87  07/06/12 87    Pt is A&Ox3  WDWN male with no complaints  Left amputation wound is healing well.  There is  good bone coverage in the stump Stump is warm and well perfused, without drainage; without erythema Generalized pain in the left upper extremity.  He has decreased edema on the left and increased function with gross and fine motor skills. Right upper extremity motor and sensation without improvement.   Assessment/plan:  Tyler Erickson is a 62 y.o. male who is s/p Left Procedure(s): TRANSESOPHAGEAL ECHOCARDIOGRAM (TEE) Negative for cardiac evolvement.  The patient's stump is viable.  Follow-up 4 weeks from surgery  C6-7 left disc herniation we may want to try gabapentin for the left upper extremity pain per internal medicine.  Thanks   Clinton Gallant [facility] 7:45 AM 07/06/[phone]

## 2012-07-07 ENCOUNTER — Inpatient Hospital Stay (HOSPITAL_COMMUNITY): Payer: Medicare HMO

## 2012-07-07 LAB — PHOSPHORUS: Phosphorus: 2.2 mg/dL — ABNORMAL LOW (ref 2.3–4.6)

## 2012-07-07 LAB — BASIC METABOLIC PANEL
Calcium: 6.5 mg/dL — ABNORMAL LOW (ref 8.4–10.5)
GFR calc Af Amer: 79 mL/min — ABNORMAL LOW (ref >90)
GFR calc non Af Amer: 68 mL/min — ABNORMAL LOW (ref >90)
Glucose, Bld: 190 mg/dL — ABNORMAL HIGH (ref 70–99)
Potassium: 4.1 mEq/L (ref 3.5–5.1)
Sodium: 134 mEq/L — ABNORMAL LOW (ref 135–145)

## 2012-07-07 LAB — MAGNESIUM: Magnesium: 1.7 mg/dL (ref 1.5–2.5)

## 2012-07-07 LAB — GLUCOSE, CAPILLARY
Glucose-Capillary: 243 mg/dL — ABNORMAL HIGH (ref 70–99)
Glucose-Capillary: 173 mg/dL — ABNORMAL HIGH (ref 70–99)

## 2012-07-07 MED ORDER — FUROSEMIDE 10 MG/ML IJ SOLN
40.0000 mg | Freq: Once | INTRAMUSCULAR | Status: AC
  Administered 2012-07-07: 40 mg via INTRAVENOUS
  Filled 2012-07-07: qty 4

## 2012-07-07 MED ORDER — IOHEXOL 300 MG/ML  SOLN
20.0000 mL | INTRAMUSCULAR | Status: AC
  Administered 2012-07-07: 25 mL via ORAL
  Administered 2012-07-07: 20 mL via ORAL

## 2012-07-07 MED ORDER — SODIUM CHLORIDE 0.9 % IJ SOLN
10.0000 mL | INTRAMUSCULAR | Status: DC | PRN
  Administered 2012-07-09 – 2012-07-11 (×2): 10 mL

## 2012-07-07 NOTE — Progress Notes (Signed)
Patient's lungs sounds very congested with crackles and wheezing to upper lobes. L.[place], NP notified and ordered to decrease IV fluid infusion to [facility] and to administer 40 mg lasix x 1 dose. Order carried out. Will continue to monitor.

## 2012-07-07 NOTE — Progress Notes (Signed)
Physical Therapy Treatment Patient Details Name: Tyler Erickson MRN: [id number] DOB: May 02, 1951 Today's Date: 07/07/2012 Time: 1010-1110 PT Time Calculation (min): 60 min  PT Assessment / Plan / Recommendation Comments on Treatment Session  Pt continues to make progress towards PT goals. Pt still presents with significant functional deficits secondary to pain, weakness, deconditioning, recent BKA and co morbidities.  Pt will benefit from continued skilled PT in an intense setting to maximize functional performance.    Follow Up Recommendations  CIR     Does the patient have the potential to tolerate intense rehabilitation     Barriers to Discharge        Equipment Recommendations       Recommendations for Other Services Rehab consult;OT consult  Frequency Min 3X/week   Plan      Precautions / Restrictions Precautions Precautions: Fall Precaution Comments: RUE with edema restricting movement throughout, LUE with edema with minimal movement restrictions. 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 Restrictions Weight Bearing Restrictions: Yes LLE Weight Bearing: Non weight bearing       Mobility  Bed Mobility Bed Mobility: Rolling Right;Rolling Left;Left Sidelying to Sit;Sitting - Scoot to [place] of Bed Rolling Right: 3: Mod assist;With rail Rolling Left: 3: Mod assist (cannot grasp rail with this hand) Left Sidelying to Sit: 3: Mod assist;HOB flat Sitting - Scoot to Edge of Bed: 3: Mod assist Details for Bed Mobility Assistance: pt received with large void of bowel and required total +3 for hygiene and change bed linens with flexiseal intact. Transfers Transfers: Sit to Stand;Stand to Sit;Stand Pivot Transfers Sit to Stand: 1: +2 Total assist;From elevated surface;With upper extremity assist;From bed (using LUE on bed rail) Sit to Stand: Patient Percentage: 40% Stand to Sit: 1: +2 Total assist;With upper  extremity assist;To elevated surface;To bed (using LUE on bed rail) Stand to Sit: Patient Percentage: 40% Stand Pivot Transfers: 1: +2 Total assist Stand Pivot Transfers: Patient Percentage: 40% Details for Transfer Assistance: Needed VCs for safe hand placement with transfers. Stood at back of recliner with both forearms resting on back of recliner and upper chest. Vc's need to have pt to look up and continue to look up    Exercises Other Exercises Other Exercises: RUE: AAROM to about 90 degrees of shoulder flexion; AAROM elbow flexion 80 degrees (too much edema to go any further, tight on anterior aspect, pitting on posterior aspect), AAROM forearm 10 degrees past neutral for supination; stays in full pronation; AAROM wrist extension trace; AAROM wrist flexion 10 degrees; AAROM digit flexion trace; digit extension absent; thumb movements absent   PT Diagnosis:    PT Problem List:   PT Treatment Interventions:     PT Goals Acute Rehab PT Goals PT Goal: Supine/Side to Sit - Progress: Progressing toward goal PT Goal: Sit at Edge Of Bed - Progress: Met PT Goal: Sit to Supine/Side - Progress: Progressing toward goal PT Goal: Sit to Stand - Progress: Progressing toward goal PT Goal: Stand to Sit - Progress: Progressing toward goal PT Transfer Goal: Bed to Chair/Chair to Bed - Progress: Progressing toward goal  Visit Information  Last PT Received On: 07/07/12 Assistance Needed: +2    Subjective Data  Subjective: "I am really sweaty"   Cognition  Overall Cognitive Status: Appears within functional limits for tasks assessed/performed Arousal/Alertness: Awake/alert Orientation Level: Appears intact for tasks assessed Behavior During Session: [facility] for tasks performed    Balance  Static  Sitting Balance Static Sitting - Balance Support: Feet supported;No upper extremity supported (right foot supported) Static Sitting - Level of Assistance: 6: Modified independent (Device/Increase  time) Static Sitting - Comment/# of Minutes: patient with VC's for hand placement initially Dynamic Sitting Balance Dynamic Sitting - Balance Support: Feet supported (right foot supported) Dynamic Sitting - Level of Assistance: 5: Stand by assistance Static Standing Balance Static Standing - Balance Support: Bilateral upper extremity supported Static Standing - Level of Assistance: 1: +2 Total assist Static Standing - Comment/# of Minutes: multiple trials 30 seconds; 1 minute  End of Session PT - End of Session Activity Tolerance: Patient limited by fatigue Patient left: in chair;with call bell/phone within reach;with family/visitor present Nurse Communication: Mobility status (need to monitor dressing for pt sweating)   GP     Fabio Asa 07/07/2012, 12:45 PM Charlotte Crumb, PT DPT  [phone]

## 2012-07-07 NOTE — Progress Notes (Signed)
Peripherally Inserted Central Catheter/Midline Placement  The IV Nurse has discussed with the patient and/or persons authorized to consent for the patient, the purpose of this procedure and the potential benefits and risks involved with this procedure.  The benefits include less needle sticks, lab draws from the catheter and patient may be discharged home with the catheter.  Risks include, but not limited to, infection, bleeding, blood clot (thrombus formation), and puncture of an artery; nerve damage and irregular heat beat.  Alternatives to this procedure were also discussed. Sister present for explanation - signed consent. Both patient's arms edematous - R>L so PICC placed right arm.  PICC/Midline Placement Documentation        Mellissa Kohut 07/07/2012, 5:14 PM

## 2012-07-07 NOTE — Progress Notes (Signed)
Occupational Therapy Treatment Patient Details Name: SHUN PLETZ MRN: [id number] DOB: December 13, 1950 Today's Date: 07/07/2012 Time: 1010-1110 OT Time Calculation (min): 60 min  OT Assessment / Plan / Recommendation Comments on Treatment Session This 62 yo male with multiple medical issues presents to acute OT making progress since yesterday. Would greatly benefit from intensive inpatient rehab    Follow Up Recommendations  CIR       Equipment Recommendations  Wheelchair cushion (measurements OT);Wheelchair (measurements OT);3 in 1 bedside comode    Recommendations for Other Services Rehab consult  Frequency Min 3X/week   Plan Discharge plan remains appropriate    Precautions / Restrictions Precautions Precautions: Fall Precaution Comments: RUE with edema restricting movement throughout, LUE with edema with minimal movement restrictions. 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 Restrictions Weight Bearing Restrictions: Yes LLE Weight Bearing: Non weight bearing   Pertinent Vitals/Pain RUE (minimal today per pt)    ADL  Toilet Transfer: +2 Total assistance Toilet Transfer: Patient Percentage: 40% Toilet Transfer Method: Stand pivot Toilet Transfer Equipment:  (Bed (raised) to recliner going to pt's right side) Equipment Used: Gait belt Transfers/Ambulation Related to ADLs: Total A+2 (pt=40%) sit to stand from raised bed  (first time stood 30 seconds, 2nd time 1 minute) ADL Comments: Pt incontinent of stool upon arrival (even with flexiseal).       OT Goals ADL Goals ADL Goal: Toilet Transfer - Progress: Progressing toward goals Arm Goals Additional Arm Goal #1: Patient will be able to complete 3-5 reps of AAROM for all joints to increase ROM and strength Arm Goal: Additional Goal #1 - Progress: Goal set today Additional Arm Goal #2: Pt will be able to direct others in how his RUE should be positioned  in the bed and when he is in a chair. Arm Goal: Additional Goal #2 - Progress: Goal set today Miscellaneous OT Goals OT Goal: Miscellaneous Goal #1 - Progress: Progressing toward goals OT Goal: Miscellaneous Goal #2 - Progress: Progressing toward goals  Visit Information  Last OT Received On: 07/07/12 Assistance Needed: +2 PT/OT Co-Evaluation/Treatment: Yes    Subjective Data  Subjective: I feel like I may fall (when we stood)      Cognition  Overall Cognitive Status: Appears within functional limits for tasks assessed/performed Arousal/Alertness: Awake/alert Orientation Level: Appears intact for tasks assessed Behavior During Session: [facility] for tasks performed    Mobility   Bed Mobility Bed Mobility: Rolling Right;Rolling Left;Left Sidelying to Sit;Sitting - Scoot to [place] of Bed Rolling Right: 3: Mod assist;With rail Rolling Left: 3: Mod assist (cannot grasp rail with this hand) Left Sidelying to Sit: 3: Mod assist;HOB flat Sitting - Scoot to Edge of Bed: 3: Mod assist Transfers Transfers: Sit to Stand;Stand to Sit Sit to Stand: 1: +2 Total assist;From elevated surface;With upper extremity assist;From bed (using LUE on bed rail) Sit to Stand: Patient Percentage: 40% Stand to Sit: 1: +2 Total assist;With upper extremity assist;To elevated surface;To bed (using LUE on bed rail) Stand to Sit: Patient Percentage: 40% Details for Transfer Assistance: Needed VCs for safe hand placement with transfers. Stood at back of recliner with both forearms resting on back of recliner and upper chest. Vc's need to have pt to look up and continue to look up       Exercises  Other Exercises Other Exercises: RUE: AAROM to about 90 degrees of shoulder flexion; AAROM elbow flexion 80 degrees (too much edema to go  any further, tight on anterior aspect, pitting on posterior aspect), AAROM forearm 10 degrees past neutral for supination; stays in full pronation; AAROM wrist extension trace; AAROM wrist  flexion 10 degrees; AAROM digit flexion trace; digit extension absent; thumb movements absent      End of Session OT - End of Session Equipment Utilized During Treatment: Gait belt Activity Tolerance: Patient tolerated treatment well Patient left: in chair;with call bell/phone within reach Nurse Communication: Mobility status (NT: +2 A back to bed going to non amputated side)       Evette Georges [phone] 07/07/2012, 12:36 PM

## 2012-07-07 NOTE — Progress Notes (Signed)
R IJ CVC removed - vaseline gauze dressing to site - pressure to site x5 minutes. RN aware - to remove dressing after 24 hours.

## 2012-07-07 NOTE — Progress Notes (Signed)
Following pt for possible CIR admit.  Pt wants & would benefit from CIR. Faxing info to [employer] w/ request for auth for CIR.  Will plan for Monday admit to CIR, if insurance agrees.  Will follow. [phone]

## 2012-07-07 NOTE — Progress Notes (Addendum)
NUTRITION FOLLOW UP/consult  Intervention:   1. 30 ml Pro-stat TID, each supplement provides 100 kcal and 15 gm protein. May mix with food (applesauce) or beverage as needed to improve taste.  2. D/C Glucerna Shake 3. Recommend changing diet to CHO Modified Medium  Nutrition Dx:   Inadequate oral intake now related to poor appetite  as evidenced by 5% meal completion; resolved  Goal:   Meet >/=90% estimated nutrition needs. met  Monitor:   Po intake, supplement tolerance, weight trends, I/O's  Assessment:   Pt was extubated on 1/5. TF off with extubation.  Pt and family report pt trying to feed himself. Pt reports appetite is much better and that he is eating most of what he was sent on his meal trays.  Per wife pt knows how to eat a diabetic diet but was not taking his medications PTA or eating healthy but is now willing to make changes. Does not feel any education needed at this time. This RD encouraged pt/wife to let RD know of any questions in the future.   Height: Ht Readings from Last 1 Encounters:  06/30/12 5\' 8"  (1.727 m)    Weight Status:   Wt Readings from Last 1 Encounters:  07/07/12 154 lb 8.7 oz (70.1 kg)  admission weight 151 lbs.   Re-estimated needs:  Kcal: 2050-2300 Protein: 100-115 gm  Fluid: per team   Skin: recent BKA, left groin incision, L elbow incision  Diet Order: General PO intake: 75%   Intake/Output Summary (Last 24 hours) at 07/07/12 1600 Last data filed at 07/07/12 0715  Gross per 24 hour  Intake    600 ml  Output    900 ml  Net   -300 ml    Last BM: 1/10   Labs:   Lab 07/07/12 0510 07/06/12 0404 07/05/12 0415 07/02/12 0458 07/01/12 0635  NA 134* 135 137 -- --  K 4.1 3.6 2.8* -- --  CL 104 100 97 -- --  CO2 23 27 30  -- --  BUN 30* 44* 62* -- --  CREATININE 1.14 1.38* 1.93* -- --  CALCIUM 6.5* 6.7* 7.5* -- --  MG 1.7 1.4* -- 2.4 --  PHOS 2.2* -- -- 2.8 1.9*  GLUCOSE 190* 223* 188* -- --   Albumin  Date Value Range Status   07/03/2012 0.9* 3.5 - 5.2 g/dL Final  07/03/1094 0.9* 3.5 - 5.2 g/dL Final  0/09/5407 1.0* 3.5 - 5.2 g/dL Final    CBG (last 3)   Basename 07/07/12 1204 07/07/12 0648 07/06/12 2158  GLUCAP 173* 165* 164*    Scheduled Meds:    . antiseptic oral rinse  15 mL Mouth Rinse BID  . aspirin  325 mg Oral Daily  . cefTRIAXone (ROCEPHIN)  IV  2 g Intravenous Q24H  . cholestyramine  4 g Oral TID  . feeding supplement  237 mL Oral BID BM  . feeding supplement  30 mL Oral TID WC  . heparin subcutaneous  5,000 Units Subcutaneous Q8H  . insulin aspart  0-15 Units Subcutaneous TID WC  . insulin glargine  14 Units Subcutaneous Daily  . saccharomyces boulardii  250 mg Oral BID  . sodium chloride  3 mL Intravenous Q12H    Continuous Infusions:    . sodium chloride 100 mL/hr at 07/06/12 2159    [facility] RD, LDN, CNSC [phone] Pager [phone] After Hours Pager

## 2012-07-07 NOTE — Progress Notes (Signed)
[facility] PROGRESS NOTE  Tyler Erickson NFA:[id number] DOB: September 29, 1950 DOA: 06/29/2012 PCP: Benita Stabile, MD  Brief narrative:  62 y.o. male with a PMH of DM, diabetic neuropathy, stage III colon cancer status post subtotal colectomy and chemotherapy (11 cycles of FOLFOX) who presented to the ER 06/29/2012 with generalized weakness and left foot pain. He had not seen a doctor in over 2 years due to financial issues. The patient stated his LLE has been discolored and tender for about a week. No fever, but reported subjective chills. He had not checked his glucoses in 2 years. Pt found to have L foot gangrene and sepsis with positive blood cx. Now S/P L BKA.   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 Pt s/p MRI found to have multiple b/l watershed infarcts. MRI cervical spine demonstrated multifactorial cervical spinal stenosis with mild spinal cord mass effect from C4-C5 to C6-C7 without spinal cord signal abnormality. In addition there was moderate to severe left C7 foraminal stenosis related to disc and endplate changes.  Assessment/Plan: Gangrene of foot  Stable - followed by Vascular Surgery post BKA.   Group A strep bacteremia/ Septic shock  Total 6 wks of antibiotics. Currently on Rocephin. ID following. PICC line today, DC IJ.   Right hand pain/weakness etiology unclear cervical spinal stenosis with mild mass effect  Compartment pressures determine by Ortho in OR and not found to be elevated. Patient has multifocal cervical spinal stenosis with mild spinal cord mass effect, moderate to severe left C7 foraminal stenosis.  Patient on my exam was able to raise his hand 90 from the ground.  When I passively raised his hand above his head he was able to maintain his hand for a few seconds without any difficulty.  Suspect functional component.  Patient not endorsing any paresthesias or shoulder pain to  suspect cervical spine or brachial plexus etiology.  On imaging though patient has bilateral cervical spinal stenosis worse on the left with patient's symptoms on the right.  Left upper extremity edema  Left upper extremity venous Dopplers negative for DVT.  Given significant bilateral upper extremity edema will get CT of the chest, abdomen and pelvis for further imaging.  Embolic infarcts  TEE showed no evidence of cardiac vegetation or emboli. Neurology recommends full dose aspirin daily for stroke prophylaxis. Neurology also recommends transcranial Doppler bubble study with emboli monitoring with Dr. Pearlean Brownie in one month to further evaluate or possible PFO as outpatient.  Diarrhea/hypomagnesemia  Probiotics started by ID. Per pts brother in law, he had diarrhea before admission. H/o colon CA last follow up CT was 2011. Continue cholestyramine.  C. difficile by PCR negative on 07/04/2012.   DM  Only takes metformin at home but sugars run in 300s.  Hemoglobin A1C 10.1.  Further titration depending on patient's clinical course.  AKI (acute kidney injury) on chronic kidney disease stage II Resolved.  Continue IV fluids.   Anasarca  Due to hypoalbuminemia and sepsis. Appears to have resolved w/ diuretics and albumin.  Severe protein-calorie malnutrition  Appetite improving.  Continue supplemental nutrition.   Hypokalemia  Replace as needed.  Code Status: full code  Family Communication: spoke w/ family in the room  Disposition Plan: Transfer to neuro telemetry  DVT prophylaxis: heparin   Consultants:  Vasc Surgery  ID  Cardiology for TEE  Neurology  Orthopedics - Hang Surgery  LINES / TUBES:  1/03 R IJ CVL >>  1/03 ETT >> 1/5  1/03 Rt femoral Aline   CULTURES:  1/2 blood >> GPC in chains >> Group A strep sens pending.  1/2 urine >> negative   ANTIBIOTICS:  1/2 clindamycin >> 1/6  1/2 zosyn >> 1/6  1/3 vancomycin >> 1/3  Rocephin 1/6 >>   Procedures:  1/03 RUE  arteriogram >> Normal arch anatomy, diffusely atherosclerotic right upper extremity arterial tree unreconstructable  1/03 - L BKA  1/8: TEE >> normal LV function, trace MR without vegetations, no atrial thrombus, no PFO   HPI/Subjective: No specific concerns.  Right upper extremity feeling weak.  Objective: Filed Vitals:   07/07/12 0200 07/07/12 0600 07/07/12 1021 07/07/12 1456  BP: 134/61 125/61 131/60 129/62  Pulse: 86 83 82 84  Temp: 99.9 F (37.7 C) 99.1 F (37.3 C) 98.4 F (36.9 C) 98.2 F (36.8 C)  TempSrc:   Axillary Axillary  Resp: 18 18 18 18   Height:      Weight:  70.1 kg (154 lb 8.7 oz)    SpO2: 98% 100% 100% 100%    Intake/Output Summary (Last 24 hours) at 07/07/12 1714 Last data filed at 07/07/12 0715  Gross per 24 hour  Intake    360 ml  Output   1600 ml  Net  -1240 ml   Filed Weights   07/03/12 0452 07/06/12 0500 07/07/12 0600  Weight: 79.7 kg (175 lb 11.3 oz) 69 kg (152 lb 1.9 oz) 70.1 kg (154 lb 8.7 oz)    Exam: Physical Exam: General: Awake, Oriented, No acute distress. HEENT: EOMI. Neck: Supple CV: S1 and S2 Lungs: Clear to ascultation bilaterally Abdomen: Soft, Nontender, Nondistended, +bowel sounds. Ext: Good pulses. Trace edema.  Status post left BKA.  Left upper extremity swollen.  Right and left upper extremity covered in bandages on his forearms  Data Reviewed: Basic Metabolic Panel:  Lab 07/07/12 8119 07/06/12 0404 07/05/12 0415 07/03/12 1600 07/03/12 0719 07/02/12 0458 07/01/12 0635  NA 134* 135 137 141 141 -- --  K 4.1 3.6 2.8* 3.6 3.7 -- --  CL 104 100 97 107 107 -- --  CO2 23 27 30 23 21  -- --  GLUCOSE 190* 223* 188* 154* 165* -- --  BUN 30* 44* 62* 69* 73* -- --  CREATININE 1.14 1.38* 1.93* 2.12* 2.11* -- --  CALCIUM 6.5* 6.7* 7.5* 7.2* 6.8* -- --  MG 1.7 1.4* -- -- -- 2.4 2.1  PHOS 2.2* -- -- -- -- 2.8 1.9*   Liver Function Tests:  Lab 07/03/12 0719 07/02/12 0458 07/01/12 0635  AST 73* 91* 154*  ALT 31 30 46  ALKPHOS  152* 73 49  BILITOT 2.6* 2.6* 1.8*  PROT 5.9* 5.0* 4.5*  ALBUMIN 0.9* 0.9* 1.0*   No results found for this basename: LIPASE:5,AMYLASE:5 in the last 168 hours No results found for this basename: AMMONIA:5 in the last 168 hours CBC:  Lab 07/06/12 1100 07/05/12 0415 07/03/12 0717 07/02/12 0458 07/01/12 0635  WBC 9.1 8.7 15.7* 12.6* 9.7  NEUTROABS -- -- -- -- --  HGB 9.0* 9.6* 9.4* 8.9* 10.2*  HCT 26.7* 27.8* 26.9* 25.5* 28.8*  MCV 84.2 81.5 83.3 80.7 79.3  PLT 87* 86* 85* PLATELET CLUMPS NOTED ON SMEAR, COUNT APPEARS DECREASED 113*   Cardiac Enzymes: No results found for this basename: CKTOTAL:5,CKMB:5,CKMBINDEX:5,TROPONINI:5 in the last 168 hours BNP (last 3 results) No results found for this basename: PROBNP:3 in the last 8760 hours CBG:  Lab 07/07/12 1204 07/07/12 0648 07/06/12 2158 07/06/12  1605 07/06/12 1125  GLUCAP 173* 165* 164* 243* 201*    Recent Results (from the past 240 hour(s))  CULTURE, BLOOD (ROUTINE X 2)     Status: Normal   Collection Time   06/29/12  2:15 PM      Component Value Range Status Comment   Specimen Description BLOOD RIGHT HAND   Final    Special Requests BOTTLES DRAWN AEROBIC AND ANAEROBIC 4CC EACH   Final    Culture  Setup Time 06/29/2012 21:41   Final    Culture     Final    Value: GROUP A STREP (S.PYOGENES) ISOLATED     Note: CRITICAL RESULT CALLED TO, READ BACK BY AND VERIFIED WITH: CHRIS ARMITAGE 07/01/12 @ 10:45AM BY RUSCA. FAXED TO [facility]     Note: Gram Stain Report Called to,Read Back By and Verified With: TERESA Antony Salmon 06/30/12 1040 BY SMITHERSJ   Report Status 07/06/2012 FINAL   Final   CULTURE, BLOOD (ROUTINE X 2)     Status: Normal   Collection Time   06/29/12  2:20 PM      Component Value Range Status Comment   Specimen Description BLOOD RIGHT THUMB   Final    Special Requests BOTTLES DRAWN AEROBIC ONLY 3CC   Final    Culture  Setup Time 06/29/2012 21:40   Final    Culture     Final    Value: GROUP A STREP  (S.PYOGENES) ISOLATED     Note: CRITICAL RESULT CALLED TO, READ BACK BY AND VERIFIED WITH: CHRIS ARMITAGE 07/01/12 @ 10:45AM BY RUSCA. FAXED TO [facility]     Note: PAM ALVORD 06/30/12 1135 BY SMITHERSJ   Report Status 07/06/2012 FINAL   Final   URINE CULTURE     Status: Normal   Collection Time   06/29/12  4:01 PM      Component Value Range Status Comment   Specimen Description URINE, CLEAN CATCH   Final    Special Requests NONE   Final    Culture  Setup Time 06/30/2012 02:24   Final    Colony Count NO GROWTH   Final    Culture NO GROWTH   Final    Report Status 07/01/2012 FINAL   Final   MRSA PCR SCREENING     Status: Normal   Collection Time   06/29/12  8:56 PM      Component Value Range Status Comment   MRSA by PCR NEGATIVE  NEGATIVE Final   SURGICAL PCR SCREEN     Status: Normal   Collection Time   06/30/12 10:08 AM      Component Value Range Status Comment   MRSA, PCR NEGATIVE  NEGATIVE Final    Staphylococcus aureus NEGATIVE  NEGATIVE Final   CULTURE, BLOOD (ROUTINE X 2)     Status: Normal (Preliminary result)   Collection Time   07/03/12  6:06 PM      Component Value Range Status Comment   Specimen Description BLOOD LEFT THUMB   Final    Special Requests BOTTLES DRAWN AEROBIC ONLY 2CC   Final    Culture  Setup Time 07/04/2012 01:53   Final    Culture     Final    Value:        BLOOD CULTURE RECEIVED NO GROWTH TO DATE CULTURE WILL BE HELD FOR 5 DAYS BEFORE ISSUING A FINAL NEGATIVE REPORT   Report Status PENDING   Incomplete   CLOSTRIDIUM DIFFICILE  BY PCR     Status: Normal   Collection Time   07/04/12  4:00 AM      Component Value Range Status Comment   C difficile by pcr NEGATIVE  NEGATIVE Final      Studies: No results found.  Scheduled Meds:   . antiseptic oral rinse  15 mL Mouth Rinse BID  . aspirin  325 mg Oral Daily  . cefTRIAXone (ROCEPHIN)  IV  2 g Intravenous Q24H  . cholestyramine  4 g Oral TID  . feeding supplement  30 mL Oral TID WC  . heparin  subcutaneous  5,000 Units Subcutaneous Q8H  . insulin aspart  0-15 Units Subcutaneous TID WC  . insulin glargine  14 Units Subcutaneous Daily  . saccharomyces boulardii  250 mg Oral BID  . sodium chloride  3 mL Intravenous Q12H   Continuous Infusions:   . sodium chloride 100 mL/hr at 07/06/12 2159    Principal Problem:  *Gangrene of foot Active Problems:  ADENOCARCINOMA, COLON  DM  NEUROPATHY  Hyponatremia  Hypokalemia  Metabolic acidosis  AKI (acute kidney injury)  Leukocytosis  Diabetic foot ulcer  Decreased range of motion of shoulder  Septic shock(785.52)  Severe protein-calorie malnutrition   Tyler Erickson A  [facility] Pager [phone]. If 8PM-8AM, please contact night-coverage at [email or web address], password [facility] 07/07/2012, 5:14 PM  LOS: 8 days

## 2012-07-07 NOTE — Progress Notes (Signed)
RN tried to explained to patient the need to have the foley catheter discontinued and  possible urinary tract infection. Patient requested to have the foley catheter until he could get out of bed.

## 2012-07-08 ENCOUNTER — Inpatient Hospital Stay (HOSPITAL_COMMUNITY): Payer: Medicare HMO

## 2012-07-08 LAB — BODY FLUID CELL COUNT WITH DIFFERENTIAL
Lymphs, Fluid: 52 %
Neutrophil Count, Fluid: 15 % (ref 0–25)
Total Nucleated Cell Count, Fluid: 560 cu mm (ref 0–1000)

## 2012-07-08 LAB — CBC
HCT: 27.3 % — ABNORMAL LOW (ref 39.0–52.0)
Hemoglobin: 9.1 g/dL — ABNORMAL LOW (ref 13.0–17.0)
MCH: 28.2 pg (ref 26.0–34.0)
MCV: 84.5 fL (ref 78.0–100.0)
Platelets: 198 10*3/uL (ref 150–400)
RBC: 3.23 MIL/uL — ABNORMAL LOW (ref 4.22–5.81)
WBC: 11.6 10*3/uL — ABNORMAL HIGH (ref 4.0–10.5)

## 2012-07-08 LAB — PROTEIN, BODY FLUID: Total protein, fluid: 2.4 g/dL

## 2012-07-08 LAB — GLUCOSE, CAPILLARY
Glucose-Capillary: 269 mg/dL — ABNORMAL HIGH (ref 70–99)
Glucose-Capillary: 202 mg/dL — ABNORMAL HIGH (ref 70–99)
Glucose-Capillary: 152 mg/dL — ABNORMAL HIGH (ref 70–99)

## 2012-07-08 LAB — BASIC METABOLIC PANEL
BUN: 26 mg/dL — ABNORMAL HIGH (ref 6–23)
CO2: 19 mEq/L (ref 19–32)
Calcium: 6.7 mg/dL — ABNORMAL LOW (ref 8.4–10.5)
Chloride: 104 mEq/L (ref 96–112)
Creatinine, Ser: 1.19 mg/dL (ref 0.50–1.35)
Glucose, Bld: 185 mg/dL — ABNORMAL HIGH (ref 70–99)

## 2012-07-08 MED ORDER — IOHEXOL 350 MG/ML SOLN
80.0000 mL | Freq: Once | INTRAVENOUS | Status: AC | PRN
  Administered 2012-07-08: 80 mL via INTRAVENOUS

## 2012-07-08 NOTE — Progress Notes (Signed)
PT Cancellation Note  Patient Details Name: AHMAAD NEIDHARDT MRN: [id number] DOB: 1950/09/16   Cancelled Treatment:    Reason Eval/Treat Not Completed: RN request.  Will attempt back later today if time allows.     Verdell Face, [place] [phone] 07/08/2012

## 2012-07-08 NOTE — Procedures (Signed)
Procedure : right thoracentesis Specimen : 210 ml amber colored fluid Complications : none immediate  Fluid sent to the lab as requested. Pt tolerated well. Post CXR pending

## 2012-07-08 NOTE — Progress Notes (Signed)
[facility] PROGRESS NOTE  [name] WNU:[id number] DOB: [birth year] DOA: 06/29/2012 PCP: Tyler Stabile, MD  Brief narrative:  62 y.o. male with a PMH of DM, diabetic neuropathy, stage III colon cancer status post subtotal colectomy and chemotherapy (11 cycles of FOLFOX) who presented to the ER 06/29/2012 with generalized weakness and left foot pain. He had not seen a doctor in over 2 years due to financial issues. The patient stated his LLE has been discolored and tender for about a week. No fever, but reported subjective chills. He had not checked his glucoses in 2 years. Pt found to have L foot gangrene and sepsis with positive blood cx. Now S/P L BKA.   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 Pt s/p MRI found to have multiple b/l watershed infarcts. MRI cervical spine demonstrated multifactorial cervical spinal stenosis with mild spinal cord mass effect from C4-C5 to C6-C7 without spinal cord signal abnormality. In addition there was moderate to severe left C7 foraminal stenosis related to disc and endplate changes.  Assessment/Plan: Gangrene of foot s/p BKA Stable. Followed by Vascular Surgery.   Group A strep bacteremia / Septic shock  Total 6 wks of antibiotics. Currently on Rocephin. ID following. PICC line on 07/08/2012.  Right hand pain/weakness etiology unclear cervical spinal stenosis with mild mass effect  Compartment pressures determine by Ortho in OR and not found to be elevated. Patient has multifocal cervical spinal stenosis with mild spinal cord mass effect, moderate to severe left C7 foraminal stenosis.  Patient exam was able to raise his hand parallel to the ground.  When I passively raised his hand above his head he was able to maintain his hand for a few seconds without any difficulty.  Suspect functional component.  Patient not endorsing any paresthesias or shoulder pain to  suspect cervical spine or brachial plexus etiology.  On imaging though patient has bilateral cervical spinal stenosis worse on the left with patient's symptoms on the right.  Left upper extremity edema  Left upper extremity venous Dopplers negative for DVT.  Given significant bilateral upper extremity edema. CT of chest, abdomen and pelvis did not show any etiology for edema.  Pleural effusions CT chest showed moderate left and large right pleural effusions with compressive atelectasis in the lower lobes. Will attempt thoracentesis. Check cytology given history of cancer.  Embolic infarcts  TEE showed no evidence of cardiac vegetation or emboli. Neurology recommends full dose aspirin daily for stroke prophylaxis. Neurology also recommends transcranial Doppler bubble study with emboli monitoring with Dr. Pearlean Brownie in one month to further evaluate or possible PFO as outpatient.  Diarrhea/hypomagnesemia  Probiotics started by ID. H/o colon CA last follow up CT was 2011. Continue cholestyramine.  C. difficile by PCR negative on 07/04/2012. CT abdomen and pelvis done with results as indicated below.  DM  Only takes metformin at home but sugars run in 300s.  Hemoglobin A1C 10.1.  Further titration depending on patient's clinical course.  AKI (acute kidney injury) on chronic kidney disease stage II Resolved.  Continue IV fluids.   Anasarca  Due to hypoalbuminemia and sepsis. Appears to have resolved w/ diuretics and albumin.  Severe protein-calorie malnutrition  Appetite improving.  Continue supplemental nutrition.   Hypokalemia  Replace as needed.  Code Status: full code  Family Communication: No family at bedside. Disposition Plan: Pending. CIR versus SNF.  DVT prophylaxis: heparin   Consultants:  Vasc Surgery  ID  Cardiology for TEE  Neurology  Orthopedics [facility]  LINES / TUBES:  1/03 R IJ CVL >>  1/03 ETT >> 1/5  1/03 Rt femoral Aline   CULTURES:  1/2 blood >> GPC in  chains >> Group A strep sens pending.  1/2 urine >> negative   ANTIBIOTICS:  1/2 clindamycin >> 1/6  1/2 zosyn >> 1/6  1/3 vancomycin >> 1/3  Rocephin 1/6 >>   Procedures:  1/03 RUE arteriogram >> Normal arch anatomy, diffusely atherosclerotic right upper extremity arterial tree unreconstructable  1/03 - L BKA  1/8: TEE >> normal LV function, trace MR without vegetations, no atrial thrombus, no PFO   HPI/Subjective: Had some chest congestion last night. Fluids KVO. Eating with the help of an aide.  Objective: Filed Vitals:   07/07/12 1854 07/07/12 2200 07/08/12 0200 07/08/12 0600  BP: 125/67 136/62 122/51 122/87  Pulse:  91 95 94  Temp: 97.9 F (36.6 C) 98.5 F (36.9 C) 99.6 F (37.6 C) 99.3 F (37.4 C)  TempSrc: Oral     Resp: 18 18 18 18   Height:      Weight:    70.9 kg (156 lb 4.9 oz)  SpO2: 100% 99% 96% 97%    Intake/Output Summary (Last 24 hours) at 07/08/12 0843 Last data filed at 07/08/12 0100  Gross per 24 hour  Intake      0 ml  Output   1850 ml  Net  -1850 ml   Filed Weights   07/06/12 0500 07/07/12 0600 07/08/12 0600  Weight: 69 kg (152 lb 1.9 oz) 70.1 kg (154 lb 8.7 oz) 70.9 kg (156 lb 4.9 oz)    Exam: Physical Exam: General: Awake, Oriented, No acute distress. HEENT: EOMI. Neck: Supple CV: S1 and S2 Lungs: Scattered crackles at bases bilaterally. Abdomen: Soft, Nontender, Nondistended, +bowel sounds. Ext: Good pulses. Trace edema.  Status post left BKA.  Left upper extremity swollen.  Right and left upper extremity covered in bandages on his forearms  Data Reviewed: Basic Metabolic Panel:  Lab 07/08/12 0454 07/07/12 0510 07/06/12 0404 07/05/12 0415 07/03/12 1600 07/02/12 0458  NA 132* 134* 135 137 141 --  K 3.6 4.1 3.6 2.8* 3.6 --  CL 104 104 100 97 107 --  CO2 19 23 27 30 23  --  GLUCOSE 185* 190* 223* 188* 154* --  BUN 26* 30* 44* 62* 69* --  CREATININE 1.19 1.14 1.38* 1.93* 2.12* --  CALCIUM 6.7* 6.5* 6.7* 7.5* 7.2* --  MG -- 1.7 1.4*  -- -- 2.4  PHOS -- 2.2* -- -- -- 2.8   Liver Function Tests:  Lab 07/03/12 0719 07/02/12 0458  AST 73* 91*  ALT 31 30  ALKPHOS 152* 73  BILITOT 2.6* 2.6*  PROT 5.9* 5.0*  ALBUMIN 0.9* 0.9*   No results found for this basename: LIPASE:5,AMYLASE:5 in the last 168 hours No results found for this basename: AMMONIA:5 in the last 168 hours CBC:  Lab 07/08/12 0500 07/06/12 1100 07/05/12 0415 07/03/12 0717 07/02/12 0458  WBC 11.6* 9.1 8.7 15.7* 12.6*  NEUTROABS -- -- -- -- --  HGB 9.1* 9.0* 9.6* 9.4* 8.9*  HCT 27.3* 26.7* 27.8* 26.9* 25.5*  MCV 84.5 84.2 81.5 83.3 80.7  PLT 198 87* 86* 85* PLATELET CLUMPS NOTED ON SMEAR, COUNT APPEARS DECREASED   Cardiac Enzymes: No results found for this basename: CKTOTAL:5,CKMB:5,CKMBINDEX:5,TROPONINI:5 in the last 168 hours BNP (last 3 results) No results found for this basename: PROBNP:3 in the last 8760 hours  CBG:  Lab 07/08/12 0635 07/07/12 2208 07/07/12 1204 07/07/12 0648 07/06/12 2158  GLUCAP 168* 269* 173* 165* 164*    Recent Results (from the past 240 hour(s))  CULTURE, BLOOD (ROUTINE X 2)     Status: Normal   Collection Time   06/29/12  2:15 PM      Component Value Range Status Comment   Specimen Description BLOOD RIGHT HAND   Final    Special Requests BOTTLES DRAWN AEROBIC AND ANAEROBIC 4CC EACH   Final    Culture  Setup Time 06/29/2012 21:41   Final    Culture     Final    Value: GROUP A STREP (S.PYOGENES) ISOLATED     Note: CRITICAL RESULT CALLED TO, READ BACK BY AND VERIFIED WITH: CHRIS ARMITAGE 07/01/12 @ 10:45AM BY RUSCA. FAXED TO [facility]     Note: Gram Stain Report Called to,Read Back By and Verified With: TERESA Antony Salmon 06/30/12 1040 BY SMITHERSJ   Report Status 07/06/2012 FINAL   Final   CULTURE, BLOOD (ROUTINE X 2)     Status: Normal   Collection Time   06/29/12  2:20 PM      Component Value Range Status Comment   Specimen Description BLOOD RIGHT THUMB   Final    Special Requests BOTTLES DRAWN AEROBIC ONLY  3CC   Final    Culture  Setup Time 06/29/2012 21:40   Final    Culture     Final    Value: GROUP A STREP (S.PYOGENES) ISOLATED     Note: CRITICAL RESULT CALLED TO, READ BACK BY AND VERIFIED WITH: CHRIS ARMITAGE 07/01/12 @ 10:45AM BY RUSCA. FAXED TO [facility]     Note: PAM ALVORD 06/30/12 1135 BY SMITHERSJ   Report Status 07/06/2012 FINAL   Final   URINE CULTURE     Status: Normal   Collection Time   06/29/12  4:01 PM      Component Value Range Status Comment   Specimen Description URINE, CLEAN CATCH   Final    Special Requests NONE   Final    Culture  Setup Time 06/30/2012 02:24   Final    Colony Count NO GROWTH   Final    Culture NO GROWTH   Final    Report Status 07/01/2012 FINAL   Final   MRSA PCR SCREENING     Status: Normal   Collection Time   06/29/12  8:56 PM      Component Value Range Status Comment   MRSA by PCR NEGATIVE  NEGATIVE Final   SURGICAL PCR SCREEN     Status: Normal   Collection Time   06/30/12 10:08 AM      Component Value Range Status Comment   MRSA, PCR NEGATIVE  NEGATIVE Final    Staphylococcus aureus NEGATIVE  NEGATIVE Final   CULTURE, BLOOD (ROUTINE X 2)     Status: Normal (Preliminary result)   Collection Time   07/03/12  6:06 PM      Component Value Range Status Comment   Specimen Description BLOOD LEFT THUMB   Final    Special Requests BOTTLES DRAWN AEROBIC ONLY 2CC   Final    Culture  Setup Time 07/04/2012 01:53   Final    Culture     Final    Value:        BLOOD CULTURE RECEIVED NO GROWTH TO DATE CULTURE WILL BE HELD FOR 5 DAYS BEFORE ISSUING A FINAL NEGATIVE REPORT  Report Status PENDING   Incomplete   CLOSTRIDIUM DIFFICILE BY PCR     Status: Normal   Collection Time   07/04/12  4:00 AM      Component Value Range Status Comment   C difficile by pcr NEGATIVE  NEGATIVE Final      Studies: Ct Angio Chest Pe W/cm &/or Wo Cm  07/08/2012  *RADIOLOGY REPORT*  Clinical Data:   Bilateral upper extremity swelling.  History of colon cancer.  CT  ANGIOGRAPHY CHEST CT ABDOMEN AND PELVIS WITH CONTRAST  Technique:  Multidetector CT imaging of the chest was performed using the standard protocol during bolus administration of intravenous contrast.  Multiplanar CT image reconstructions including MIPs were obtained to evaluate the vascular anatomy. Multidetector CT imaging of the abdomen and pelvis was performed using the standard protocol during bolus administration of intravenous contrast.  Contrast: 80mL OMNIPAQUE IOHEXOL 350 MG/ML SOLN,  Comparison:   None.  CTA CHEST  Findings: No filling defects in the pulmonary arteries to suggest pulmonary emboli.  Moderate left and large right pleural effusions. Compressive atelectasis in the lower lobes.  No visible pulmonary nodules.  Heart is normal size.  Aorta is normal caliber. No mediastinal, hilar, or axillary adenopathy.  Visualized thyroid and chest wall soft tissues unremarkable.    IMPRESSION:  .Moderate left and large right pleural effusions.  Compressive atelectasis in the lower lobes.  CT ABDOMEN AND PELVIS  Findings: No focal lesion in the liver.  Stomach is mildly distended but otherwise grossly unremarkable.  Spleen, pancreas, adrenals and kidneys are unremarkable.  No hydronephrosis.  Small amount of free fluid adjacent to the liver and in the paracolic gutters and pelvis.  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  No evidence of bowel obstruction.  Foley catheter in place with the bladder decompressed.  Aorta and iliac vessels are heavily calcified, non-aneurysmal.  No free air.  No adenopathy.  No acute bony abnormality.  Fusion across the SI joints. Degenerative changes in the lumbar spine.  Review of the MIP images confirms the above findings.  IMPRESSION: Prior subtotal colectomy.  Small amount of free fluid in the abdomen and pelvis.   Original Report Authenticated By: Charlett Nose, M.D.    Ct Abdomen Pelvis W Contrast  07/08/2012  *RADIOLOGY REPORT*  Clinical Data:   Bilateral upper  extremity swelling.  History of colon cancer.  CT ANGIOGRAPHY CHEST CT ABDOMEN AND PELVIS WITH CONTRAST  Technique:  Multidetector CT imaging of the chest was performed using the standard protocol during bolus administration of intravenous contrast.  Multiplanar CT image reconstructions including MIPs were obtained to evaluate the vascular anatomy. Multidetector CT imaging of the abdomen and pelvis was performed using the standard protocol during bolus administration of intravenous contrast.  Contrast: 80mL OMNIPAQUE IOHEXOL 350 MG/ML SOLN,  Comparison:   None.  CTA CHEST  Findings: No filling defects in the pulmonary arteries to suggest pulmonary emboli.  Moderate left and large right pleural effusions. Compressive atelectasis in the lower lobes.  No visible pulmonary nodules.  Heart is normal size.  Aorta is normal caliber. No mediastinal, hilar, or axillary adenopathy.  Visualized thyroid and chest wall soft tissues unremarkable.    IMPRESSION:  .Moderate left and large right pleural effusions.  Compressive atelectasis in the lower lobes.  CT ABDOMEN AND PELVIS  Findings: No focal lesion in the liver.  Stomach is mildly distended but otherwise grossly unremarkable.  Spleen, pancreas, adrenals and kidneys are unremarkable.  No hydronephrosis.  Small amount of  free fluid adjacent to the liver and in the paracolic gutters and pelvis.  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  No evidence of bowel obstruction.  Foley catheter in place with the bladder decompressed.  Aorta and iliac vessels are heavily calcified, non-aneurysmal.  No free air.  No adenopathy.  No acute bony abnormality.  Fusion across the SI joints. Degenerative changes in the lumbar spine.  Review of the MIP images confirms the above findings.  IMPRESSION: Prior subtotal colectomy.  Small amount of free fluid in the abdomen and pelvis.   Original Report Authenticated By: Charlett Nose, M.D.    Dg Chest Port 1 View  07/07/2012  *RADIOLOGY  REPORT*  Clinical Data: Line placement  PORTABLE CHEST - 1 VIEW  Comparison: Chest radiograph 07/02/2012  Findings: Interval placement of a right PICC line with the tip in the distal SVC / cavoatrial junction.  Right central venous line is unchanged.  Interval extubation.  Stable cardiac silhouette.  Lungs are clear.  IMPRESSION: Interval placement of right PICC line with tip at the cavoatrial junction.   Original Report Authenticated By: Genevive Bi, M.D.     Scheduled Meds:    . antiseptic oral rinse  15 mL Mouth Rinse BID  . aspirin  325 mg Oral Daily  . cefTRIAXone (ROCEPHIN)  IV  2 g Intravenous Q24H  . cholestyramine  4 g Oral TID  . feeding supplement  30 mL Oral TID WC  . heparin subcutaneous  5,000 Units Subcutaneous Q8H  . insulin aspart  0-15 Units Subcutaneous TID WC  . insulin glargine  14 Units Subcutaneous Daily  . saccharomyces boulardii  250 mg Oral BID  . sodium chloride  3 mL Intravenous Q12H   Continuous Infusions:    . sodium chloride 10 mL/hr (07/07/12 2258)    Principal Problem:  *Gangrene of foot Active Problems:  ADENOCARCINOMA, COLON  DM  NEUROPATHY  Hyponatremia  Hypokalemia  Metabolic acidosis  AKI (acute kidney injury)  Leukocytosis  Diabetic foot ulcer  Decreased range of motion of shoulder  Septic shock(785.52)  Severe protein-calorie malnutrition   Cordarius Benning A  [facility] Pager [phone]. If 8PM-8AM, please contact night-coverage at [email or web address], password [facility] 07/08/2012, 8:43 AM  LOS: 9 days

## 2012-07-09 LAB — GLUCOSE, CAPILLARY: Glucose-Capillary: 181 mg/dL — ABNORMAL HIGH (ref 70–99)

## 2012-07-09 LAB — BASIC METABOLIC PANEL
BUN: 29 mg/dL — ABNORMAL HIGH (ref 6–23)
Calcium: 7.3 mg/dL — ABNORMAL LOW (ref 8.4–10.5)
Creatinine, Ser: 1.2 mg/dL (ref 0.50–1.35)
GFR calc Af Amer: 74 mL/min — ABNORMAL LOW (ref >90)
GFR calc non Af Amer: 64 mL/min — ABNORMAL LOW (ref >90)
Potassium: 3.6 mEq/L (ref 3.5–5.1)

## 2012-07-09 LAB — CBC
MCV: 86.1 fL (ref 78.0–100.0)
Platelets: 276 10*3/uL (ref 150–400)
RDW: 15.4 % (ref 11.5–15.5)
WBC: 11.1 10*3/uL — ABNORMAL HIGH (ref 4.0–10.5)

## 2012-07-09 LAB — PH, BODY FLUID: pH, Fluid: 8

## 2012-07-09 NOTE — Progress Notes (Signed)
[facility] PROGRESS NOTE  IBN STIEF ZHY:[id number] DOB: 10/30/1950 DOA: 06/29/2012 PCP: Benita Stabile, MD  Brief narrative:  62 y.o. male with a PMH of DM, diabetic neuropathy, stage III colon cancer status post subtotal colectomy and chemotherapy (11 cycles of FOLFOX) who presented to the ER 06/29/2012 with generalized weakness and left foot pain. He had not seen a doctor in over 2 years due to financial issues. The patient stated his LLE has been discolored and tender for about a week. No fever, but reported subjective chills. He had not checked his glucoses in 2 years. Pt found to have L foot gangrene and sepsis with positive blood cx. Now S/P L BKA.   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 Pt s/p MRI found to have multiple b/l watershed infarcts. MRI cervical spine demonstrated multifactorial cervical spinal stenosis with mild spinal cord mass effect from C4-C5 to C6-C7 without spinal cord signal abnormality. In addition there was moderate to severe left C7 foraminal stenosis related to disc and endplate changes.  Assessment/Plan: Gangrene of foot s/p BKA  Stable. Followed by Vascular Surgery.   Group A strep bacteremia / Septic shock  Total 6 wks of antibiotics. Currently on Rocephin. ID following. PICC line on 07/07/2012.   Right hand pain/weakness etiology unclear cervical spinal stenosis with mild mass effect  Compartment pressures determine by Ortho in OR and not found to be elevated. Patient has multifocal cervical spinal stenosis with mild spinal cord mass effect, moderate to severe left C7 foraminal stenosis. Patient exam was able to raise his hand parallel to the ground. When I passively raised his hand above his head he was able to maintain his hand elevated for extended period of time. Suspect functional component. Patient not endorsing any paresthesias or shoulder pain to suspect  cervical spine or brachial plexus etiology. On imaging though patient has bilateral cervical spinal stenosis worse on the left with patient's symptoms on the right.   Left upper extremity edema  Left upper extremity venous Dopplers negative for DVT. Given significant bilateral upper extremity edema. CT of chest, abdomen and pelvis did not show any etiology for edema.   Pleural effusions  CT chest showed moderate left and large right pleural effusions with compressive atelectasis in the lower lobes. Thoracentesis yielded exudative fluid.  Gram stain and culture shows many WBCs but no organisms.  Suspect effusion may be related to his infection and severe protein calorie malnutrition.  Cytology on the pleural fluid pending given history of cancer.   Embolic infarcts  TEE showed no evidence of cardiac vegetation or emboli. Neurology recommends full dose aspirin daily for stroke prophylaxis. Neurology also recommends transcranial Doppler bubble study with emboli monitoring with Dr. Pearlean Brownie in one month to further evaluate or possible PFO as outpatient.   Diarrhea/hypomagnesemia  Probiotics started by ID. H/o colon CA last follow up CT was 2011. Continue cholestyramine. C. difficile by PCR negative on 07/04/2012. CT abdomen and pelvis done with results as indicated below.   DM  Only takes metformin at home but sugars run in 300s. Hemoglobin A1C 10.1. Further titration depending on patient's clinical course.  Currently only on sliding scale insulin.  AKI (acute kidney injury) on chronic kidney disease stage II  Resolved.  Anasarca  Due to hypoalbuminemia and sepsis. Appears to have resolved w/ diuretics and albumin.   Severe protein-calorie malnutrition  Appetite improving. Continue supplemental nutrition.   Hypokalemia  Replace as  needed.  Code Status: full code  Family Communication: No family at bedside.  Disposition Plan: DC to SNF versus CIR today.  DVT prophylaxis: Heparin    Consultants:  Vasc Surgery  ID  Cardiology for TEE  Neurology  Orthopedics - [facility]  LINES / TUBES:  1/03 R IJ CVL >> 1/10  1/03 ETT >> 1/5  1/03 Rt femoral Aline  1/10 R PICC Line >>  CULTURES:  1/2 blood >> GPC in chains >> Group A strep.  1/2 urine >> negative  1/6 blood >> NGTD  ANTIBIOTICS:  Clindamycin 1/2 >> 1/6  Zosyn 1/2 >> 1/6  Vancomycin 1/3 >> 1/3  Rocephin 1/6 >>   Procedures:  1/03 RUE arteriogram >> Normal arch anatomy, diffusely atherosclerotic right upper extremity arterial tree unreconstructable  1/03 - L BKA  1/8: TEE >> normal LV function, trace MR without vegetations, no atrial thrombus, no PFO  1/11: Thoracentesis >> exudative fluid  HPI/Subjective: Feeling better.  No specific concerns.  Objective: Filed Vitals:   07/08/12 1835 07/08/12 2332 07/09/12 0500 07/09/12 0538  BP: 123/60 128/60  121/56  Pulse: 88 96  97  Temp: 98.3 F (36.8 C) 98.8 F (37.1 C)  98.1 F (36.7 C)  TempSrc: Oral Oral  Oral  Resp: 20 18  18   Height:      Weight:   70.3 kg (154 lb 15.7 oz)   SpO2: 97% 97%  100%    Intake/Output Summary (Last 24 hours) at 07/09/12 0858 Last data filed at 07/09/12 0826  Gross per 24 hour  Intake   1390 ml  Output   1520 ml  Net   -130 ml   Filed Weights   07/07/12 0600 07/08/12 0600 07/09/12 0500  Weight: 70.1 kg (154 lb 8.7 oz) 70.9 kg (156 lb 4.9 oz) 70.3 kg (154 lb 15.7 oz)    Exam: Physical Exam: General: Awake, Oriented, No acute distress. HEENT: EOMI. Neck: Supple CV: S1 and S2 Lungs: Clear to ascultation bilaterally Abdomen: Soft, Nontender, Nondistended, +bowel sounds. Ext: Good pulses. Trace edema.  Status post left BKA.  Left upper extremity swollen.  Right and left upper extremity covered in bandages on his forearms  Data Reviewed: Basic Metabolic Panel:  Lab 07/08/12 6578 07/07/12 0510 07/06/12 0404 07/05/12 0415 07/03/12 1600  NA 132* 134* 135 137 141  K 3.6 4.1 3.6 2.8* 3.6  CL 104 104 100  97 107  CO2 19 23 27 30 23   GLUCOSE 185* 190* 223* 188* 154*  BUN 26* 30* 44* 62* 69*  CREATININE 1.19 1.14 1.38* 1.93* 2.12*  CALCIUM 6.7* 6.5* 6.7* 7.5* 7.2*  MG -- 1.7 1.4* -- --  PHOS -- 2.2* -- -- --   Liver Function Tests:  Lab 07/03/12 0719  AST 73*  ALT 31  ALKPHOS 152*  BILITOT 2.6*  PROT 5.9*  ALBUMIN 0.9*   No results found for this basename: LIPASE:5,AMYLASE:5 in the last 168 hours No results found for this basename: AMMONIA:5 in the last 168 hours CBC:  Lab 07/09/12 0825 07/08/12 0500 07/06/12 1100 07/05/12 0415 07/03/12 0717  WBC 11.1* 11.6* 9.1 8.7 15.7*  NEUTROABS -- -- -- -- --  HGB 8.6* 9.1* 9.0* 9.6* 9.4*  HCT 26.0* 27.3* 26.7* 27.8* 26.9*  MCV 86.1 84.5 84.2 81.5 83.3  PLT 276 198 87* 86* 85*   Cardiac Enzymes: No results found for this basename: CKTOTAL:5,CKMB:5,CKMBINDEX:5,TROPONINI:5 in the last 168 hours BNP (last 3 results) No results found for this basename:  PROBNP:3 in the last 8760 hours CBG:  Lab 07/09/12 0701 07/08/12 2133 07/08/12 1714 07/08/12 1142 07/08/12 0635  GLUCAP 129* 135* 152* 202* 168*    Recent Results (from the past 240 hour(s))  CULTURE, BLOOD (ROUTINE X 2)     Status: Normal   Collection Time   06/29/12  2:15 PM      Component Value Range Status Comment   Specimen Description BLOOD RIGHT HAND   Final    Special Requests BOTTLES DRAWN AEROBIC AND ANAEROBIC 4CC EACH   Final    Culture  Setup Time 06/29/2012 21:41   Final    Culture     Final    Value: GROUP A STREP (S.PYOGENES) ISOLATED     Note: CRITICAL RESULT CALLED TO, READ BACK BY AND VERIFIED WITH: CHRIS ARMITAGE 07/01/12 @ 10:45AM BY RUSCA. FAXED TO [facility]     Note: Gram Stain Report Called to,Read Back By and Verified With: TERESA Antony Salmon 06/30/12 1040 BY SMITHERSJ   Report Status 07/06/2012 FINAL   Final   CULTURE, BLOOD (ROUTINE X 2)     Status: Normal   Collection Time   06/29/12  2:20 PM      Component Value Range Status Comment   Specimen  Description BLOOD RIGHT THUMB   Final    Special Requests BOTTLES DRAWN AEROBIC ONLY 3CC   Final    Culture  Setup Time 06/29/2012 21:40   Final    Culture     Final    Value: GROUP A STREP (S.PYOGENES) ISOLATED     Note: CRITICAL RESULT CALLED TO, READ BACK BY AND VERIFIED WITH: CHRIS ARMITAGE 07/01/12 @ 10:45AM BY RUSCA. FAXED TO [facility]     Note: PAM ALVORD 06/30/12 1135 BY SMITHERSJ   Report Status 07/06/2012 FINAL   Final   URINE CULTURE     Status: Normal   Collection Time   06/29/12  4:01 PM      Component Value Range Status Comment   Specimen Description URINE, CLEAN CATCH   Final    Special Requests NONE   Final    Culture  Setup Time 06/30/2012 02:24   Final    Colony Count NO GROWTH   Final    Culture NO GROWTH   Final    Report Status 07/01/2012 FINAL   Final   MRSA PCR SCREENING     Status: Normal   Collection Time   06/29/12  8:56 PM      Component Value Range Status Comment   MRSA by PCR NEGATIVE  NEGATIVE Final   SURGICAL PCR SCREEN     Status: Normal   Collection Time   06/30/12 10:08 AM      Component Value Range Status Comment   MRSA, PCR NEGATIVE  NEGATIVE Final    Staphylococcus aureus NEGATIVE  NEGATIVE Final   CULTURE, BLOOD (ROUTINE X 2)     Status: Normal (Preliminary result)   Collection Time   07/03/12  6:06 PM      Component Value Range Status Comment   Specimen Description BLOOD LEFT THUMB   Final    Special Requests BOTTLES DRAWN AEROBIC ONLY 2CC   Final    Culture  Setup Time 07/04/2012 01:53   Final    Culture     Final    Value:        BLOOD CULTURE RECEIVED NO GROWTH TO DATE CULTURE WILL BE HELD FOR 5 DAYS BEFORE ISSUING  A FINAL NEGATIVE REPORT   Report Status PENDING   Incomplete   CLOSTRIDIUM DIFFICILE BY PCR     Status: Normal   Collection Time   07/04/12  4:00 AM      Component Value Range Status Comment   C difficile by pcr NEGATIVE  NEGATIVE Final   BODY FLUID CULTURE     Status: Normal (Preliminary result)   Collection Time    07/08/12  2:56 PM      Component Value Range Status Comment   Specimen Description PLEURAL RIGHT FLUID   Final    Special Requests NONE   Final    Gram Stain     Final    Value: WBC PRESENT,BOTH PMN AND MONONUCLEAR     NO ORGANISMS SEEN   Culture PENDING   Incomplete    Report Status PENDING   Incomplete      Studies: Dg Chest 1 View  07/08/2012  *RADIOLOGY REPORT*  Clinical Data: Status post thoracentesis.  CHEST - 1 VIEW  Comparison: Chest CT 07/08/2012.  Findings: The right-sided PICC line is stable.  The heart is stable.  No definite pleural effusions are identified.  No postprocedural pneumothorax.  IMPRESSION: No definite pleural effusions and no postprocedural pneumothorax.   Original Report Authenticated By: Rudie Meyer, M.D.    Ct Angio Chest Pe W/cm &/or Wo Cm  07/08/2012  *RADIOLOGY REPORT*  Clinical Data:   Bilateral upper extremity swelling.  History of colon cancer.  CT ANGIOGRAPHY CHEST CT ABDOMEN AND PELVIS WITH CONTRAST  Technique:  Multidetector CT imaging of the chest was performed using the standard protocol during bolus administration of intravenous contrast.  Multiplanar CT image reconstructions including MIPs were obtained to evaluate the vascular anatomy. Multidetector CT imaging of the abdomen and pelvis was performed using the standard protocol during bolus administration of intravenous contrast.  Contrast: 80mL OMNIPAQUE IOHEXOL 350 MG/ML SOLN,  Comparison:   None.  CTA CHEST  Findings: No filling defects in the pulmonary arteries to suggest pulmonary emboli.  Moderate left and large right pleural effusions. Compressive atelectasis in the lower lobes.  No visible pulmonary nodules.  Heart is normal size.  Aorta is normal caliber. No mediastinal, hilar, or axillary adenopathy.  Visualized thyroid and chest wall soft tissues unremarkable.    IMPRESSION:  .Moderate left and large right pleural effusions.  Compressive atelectasis in the lower lobes.  CT ABDOMEN AND PELVIS   Findings: No focal lesion in the liver.  Stomach is mildly distended but otherwise grossly unremarkable.  Spleen, pancreas, adrenals and kidneys are unremarkable.  No hydronephrosis.  Small amount of free fluid adjacent to the liver and in the paracolic gutters and pelvis.  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  No evidence of bowel obstruction.  Foley catheter in place with the bladder decompressed.  Aorta and iliac vessels are heavily calcified, non-aneurysmal.  No free air.  No adenopathy.  No acute bony abnormality.  Fusion across the SI joints. Degenerative changes in the lumbar spine.  Review of the MIP images confirms the above findings.  IMPRESSION: Prior subtotal colectomy.  Small amount of free fluid in the abdomen and pelvis.   Original Report Authenticated By: Charlett Nose, M.D.    Ct Abdomen Pelvis W Contrast  07/08/2012  *RADIOLOGY REPORT*  Clinical Data:   Bilateral upper extremity swelling.  History of colon cancer.  CT ANGIOGRAPHY CHEST CT ABDOMEN AND PELVIS WITH CONTRAST  Technique:  Multidetector CT imaging of the chest was performed using the  standard protocol during bolus administration of intravenous contrast.  Multiplanar CT image reconstructions including MIPs were obtained to evaluate the vascular anatomy. Multidetector CT imaging of the abdomen and pelvis was performed using the standard protocol during bolus administration of intravenous contrast.  Contrast: 80mL OMNIPAQUE IOHEXOL 350 MG/ML SOLN,  Comparison:   None.  CTA CHEST  Findings: No filling defects in the pulmonary arteries to suggest pulmonary emboli.  Moderate left and large right pleural effusions. Compressive atelectasis in the lower lobes.  No visible pulmonary nodules.  Heart is normal size.  Aorta is normal caliber. No mediastinal, hilar, or axillary adenopathy.  Visualized thyroid and chest wall soft tissues unremarkable.    IMPRESSION:  .Moderate left and large right pleural effusions.  Compressive atelectasis  in the lower lobes.  CT ABDOMEN AND PELVIS  Findings: No focal lesion in the liver.  Stomach is mildly distended but otherwise grossly unremarkable.  Spleen, pancreas, adrenals and kidneys are unremarkable.  No hydronephrosis.  Small amount of free fluid adjacent to the liver and in the paracolic gutters and pelvis.  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  No evidence of bowel obstruction.  Foley catheter in place with the bladder decompressed.  Aorta and iliac vessels are heavily calcified, non-aneurysmal.  No free air.  No adenopathy.  No acute bony abnormality.  Fusion across the SI joints. Degenerative changes in the lumbar spine.  Review of the MIP images confirms the above findings.  IMPRESSION: Prior subtotal colectomy.  Small amount of free fluid in the abdomen and pelvis.   Original Report Authenticated By: Charlett Nose, M.D.    Dg Chest Port 1 View  07/07/2012  *RADIOLOGY REPORT*  Clinical Data: Line placement  PORTABLE CHEST - 1 VIEW  Comparison: Chest radiograph 07/02/2012  Findings: Interval placement of a right PICC line with the tip in the distal SVC / cavoatrial junction.  Right central venous line is unchanged.  Interval extubation.  Stable cardiac silhouette.  Lungs are clear.  IMPRESSION: Interval placement of right PICC line with tip at the cavoatrial junction.   Original Report Authenticated By: Genevive Bi, M.D.    US Thoracentesis Asp Pleural Space W/img Guide  07/09/2012  *RADIOLOGY REPORT*  Clinical Data:  New onset bilateral pleural effusions.  History of colon cancer.  Request has been made for therapeutic and diagnostic thoracentesis.  ULTRASOUND GUIDED right THORACENTESIS  Comparison:  Prior chest x-ray and CT scan of the chest.  An ultrasound guided thoracentesis was thoroughly discussed with the patient and questions answered.  The benefits, risks, alternatives and complications were also discussed.  The patient understands and wishes to proceed with the procedure.   Written consent was obtained.  Ultrasound was performed to localize and mark an adequate pocket of fluid in the right chest.  The area was then prepped and draped in the normal sterile fashion.  1% Lidocaine was used for local anesthesia.  Under ultrasound guidance a 19 gauge Yueh catheter was introduced.  Thoracentesis was performed.  The catheter was removed and a dressing applied.  Complications:  None immediate  Findings: A total of approximately 210 ml of amber-colored fluid was removed. A fluid sample was sent for laboratory analysis.  IMPRESSION: Successful ultrasound guided right thoracentesis yielding 210 ml of pleural fluid.  Read by: Anselm Pancoast, P.A.-C   Original Report Authenticated By: Judie Petit. Shick, M.D.     Scheduled Meds:    . antiseptic oral rinse  15 mL Mouth Rinse BID  . aspirin  325 mg Oral Daily  . cefTRIAXone (ROCEPHIN)  IV  2 g Intravenous Q24H  . cholestyramine  4 g Oral TID  . feeding supplement  30 mL Oral TID WC  . heparin subcutaneous  5,000 Units Subcutaneous Q8H  . insulin aspart  0-15 Units Subcutaneous TID WC  . insulin glargine  14 Units Subcutaneous Daily  . saccharomyces boulardii  250 mg Oral BID  . sodium chloride  3 mL Intravenous Q12H   Continuous Infusions:    . sodium chloride 20 mL/hr at 07/09/12 0222    Principal Problem:  *Gangrene of foot Active Problems:  ADENOCARCINOMA, COLON  DM  NEUROPATHY  Hyponatremia  Hypokalemia  Metabolic acidosis  AKI (acute kidney injury)  Leukocytosis  Diabetic foot ulcer  Decreased range of motion of shoulder  Septic shock(785.52)  Severe protein-calorie malnutrition   Tarig Zimmers A  [facility] Pager [phone]. If 8PM-8AM, please contact night-coverage at [email or web address], password [facility] 07/09/2012, 8:58 AM  LOS: 10 days

## 2012-07-09 NOTE — Clinical Social Work Note (Signed)
CSW spoke with pt's sister, Malachi Bonds, and provided bed offers. Pt's sister reports hopeful for CIR, but agreeable to SNF if needed. Weekday CSW will continue to follow.  Dellie Burns, MSW, LCSWA [phone] (Weekends 8:00am-4:30pm)

## 2012-07-10 LAB — GLUCOSE, CAPILLARY: Glucose-Capillary: 156 mg/dL — ABNORMAL HIGH (ref 70–99)

## 2012-07-10 LAB — CULTURE, BLOOD (ROUTINE X 2): Culture: NO GROWTH

## 2012-07-10 MED ORDER — SACCHAROMYCES BOULARDII 250 MG PO CAPS: 250.0000 mg | ORAL_CAPSULE | Freq: Two times a day (BID) | ORAL | Status: DC

## 2012-07-10 MED ORDER — INSULIN GLARGINE 100 UNIT/ML ~~LOC~~ SOLN: 14.0000 [IU] | Freq: Every day | SUBCUTANEOUS | Status: DC

## 2012-07-10 MED ORDER — OXYCODONE HCL 5 MG PO TABS: 5.0000 mg | ORAL_TABLET | ORAL | Status: DC | PRN

## 2012-07-10 MED ORDER — CEFTRIAXONE SODIUM 2 G IJ SOLR: 2.0000 g | INTRAMUSCULAR | Status: AC

## 2012-07-10 MED ORDER — ACETAMINOPHEN 325 MG PO TABS: 650.0000 mg | ORAL_TABLET | Freq: Four times a day (QID) | ORAL | Status: DC | PRN

## 2012-07-10 MED ORDER — CHOLESTYRAMINE 4 G PO PACK: 4.0000 g | PACK | Freq: Three times a day (TID) | ORAL | Status: DC

## 2012-07-10 MED ORDER — PRO-STAT SUGAR FREE PO LIQD: 30.0000 mL | Freq: Three times a day (TID) | ORAL | Status: DC

## 2012-07-10 MED ORDER — ASPIRIN 325 MG PO TABS: 325.0000 mg | ORAL_TABLET | Freq: Every day | ORAL | Status: DC

## 2012-07-10 MED ORDER — ONDANSETRON HCL 4 MG/2ML IJ SOLN: 4.0000 mg | Freq: Four times a day (QID) | INTRAMUSCULAR | Status: DC | PRN

## 2012-07-10 MED ORDER — BIOTENE DRY MOUTH MT LIQD: 15.0000 mL | Freq: Two times a day (BID) | OROMUCOSAL | Status: DC

## 2012-07-10 MED ORDER — INSULIN ASPART 100 UNIT/ML ~~LOC~~ SOLN: 0.0000 [IU] | Freq: Three times a day (TID) | SUBCUTANEOUS | Status: DC

## 2012-07-10 MED ORDER — ALBUTEROL SULFATE (5 MG/ML) 0.5% IN NEBU: 2.5000 mg | INHALATION_SOLUTION | Freq: Four times a day (QID) | RESPIRATORY_TRACT | Status: DC | PRN

## 2012-07-10 NOTE — Discharge Summary (Addendum)
Physician Discharge Summary  Tyler Erickson:[id number] DOB: March 02, 1951 DOA: 06/29/2012  PCP: Benita Stabile, MD  Admit date: 06/29/2012 Discharge date: 07/11/2012  Time spent: 40 minutes  Recommendations for Outpatient Follow-up:  Please followup with Benita Stabile, MD (PCP) in 1-2 weeks.  Please followup with Vascular Surgery (Dr. Darrick Penna) in 1 month. Staple to remain in place until patient follows up Dr. Darrick Penna.  Please followup with Dr. Pearlean Brownie (neurology) in 1-2 month.  Please followup with Dr. Ninetta Lights (ID) in 2-3 weeks.  Discharge Diagnoses:  Principal Problem:  *Gangrene of foot Active Problems:  ADENOCARCINOMA, COLON  DM  NEUROPATHY  Hyponatremia  Hypokalemia  Metabolic acidosis  AKI (acute kidney injury)  Leukocytosis  Diabetic foot ulcer  Decreased range of motion of shoulder  Septic shock(785.52)  Severe protein-calorie malnutrition  Discharge Condition: Stable  Diet recommendation: Diabetic diet.  Filed Weights   07/09/12 1903 07/10/12 0520 07/11/12 0630  Weight: 70.1 kg (154 lb 8.7 oz) 68.493 kg (151 lb) 67.7 kg (149 lb 4 oz)    History of present illness:  62 y.o. male with a PMH of DM, diabetic neuropathy, stage III colon cancer status post subtotal colectomy and chemotherapy (11 cycles of FOLFOX) who presented to the ER 06/29/2012 with generalized weakness and left foot pain. He had not seen a doctor in over 2 years due to financial issues. The patient stated his LLE has been discolored and tender for about a week. No fever, but reported subjective chills. He had not checked his glucoses in 2 years. Pt found to have L foot gangrene and sepsis with positive blood cx. Now S/P L BKA.   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 Pt s/p MRI found to have multiple b/l watershed infarcts. MRI cervical spine demonstrated multifactorial cervical spinal stenosis with mild spinal  cord mass effect from C4-C5 to C6-C7 without spinal cord signal abnormality. In addition there was moderate to severe left C7 foraminal stenosis related to disc and endplate changes.  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 Followed by Vascular Surgery.   Group A strep bacteremia / Septic shock  ID recommended rocephin for 28 days from 07/05/2012 (till 08/01/2012) PICC line on 07/07/2012.  Right hand pain/weakness etiology unclear cervical spinal stenosis with mild mass effect  Compartment pressures determine by Ortho in OR and not found to be elevated. Patient has multifocal cervical spinal stenosis with mild spinal cord mass effect, moderate to severe left C7 foraminal stenosis. On imaging though patient has bilateral cervical spinal stenosis worse on the left with patient's symptoms on the right. Patient on exam was able to raise his hand parallel to the ground. When passively raised his hand above his head he was able to maintain his hand elevated for extended period of time. Suspect functional component. Patient not endorsing any paresthesias or shoulder pain to suspect cervical spine or brachial plexus etiology.   Left upper extremity edema  Left upper extremity venous Dopplers negative for DVT. Given significant bilateral upper extremity edema. CT of chest, abdomen and pelvis did not show any etiology for edema.   Pleural effusions  CT chest showed moderate left and large right pleural effusions with compressive atelectasis in the lower lobes. Thoracentesis yielded exudative fluid. Gram stain and culture shows many WBCs but no organisms. Suspect effusion may be related to his infection and severe protein calorie malnutrition. Cytology on the pleural fluid showed benign  mesothelial cells.   Embolic infarcts  TEE showed no evidence of cardiac vegetation or emboli. Neurology recommends full dose aspirin daily for stroke prophylaxis. Neurology also recommends transcranial  Doppler bubble study with emboli monitoring with Dr. Pearlean Brownie in 1-2 months to further evaluate or possible PFO as outpatient.   Diarrhea/hypomagnesemia  Probiotics started by ID. H/o colon CA last follow up CT was 2011. Continue cholestyramine for 1 more week. C. difficile by PCR negative on 07/04/2012. CT abdomen and pelvis done with results as indicated below. DC rectal tube without diarrhea prior to discharge.  DM  Only takes metformin at home but sugars run in 300s. Hemoglobin A1C 10.1. Further titration depending on patient's clinical course. Currently Lantus and sliding scale insulin.   AKI (acute kidney injury) on chronic kidney disease stage II  Resolved.   Anasarca  Due to hypoalbuminemia and sepsis. Appears to have resolved w/ diuretics and albumin.   Severe protein-calorie malnutrition  Appetite improving. Continue supplemental nutrition.   Hypokalemia  Replace as needed.   Consultants:  Vasc Surgery  ID  Cardiology for TEE  Neurology  Orthopedics - [facility]  LINES / TUBES:  1/03 R IJ CVL >> 1/10  1/03 ETT >> 1/5  1/03 Rt femoral Aline  1/10 R PICC Line >>   CULTURES:  1/2 blood >> GPC in chains >> Group A strep.  1/2 urine >> negative  1/6 blood >> NGTD   ANTIBIOTICS:  Clindamycin 1/2 >> 1/6  Zosyn 1/2 >> 1/6  Vancomycin 1/3 >> 1/3  Rocephin 1/6 >> till 08/01/2012  Procedures:  1/03 RUE arteriogram >> Normal arch anatomy, diffusely atherosclerotic right upper extremity arterial tree unreconstructable  1/03 - L BKA  1/8: TEE >> normal LV function, trace MR without vegetations, no atrial thrombus, no PFO  1/11: Thoracentesis >> exudative fluid, cytology shows benign mesothelial cells.  Discharge Exam: Filed Vitals:   07/10/12 0520 07/10/12 1451 07/10/12 2331 07/11/12 0630  BP: 123/53 115/49 122/51 119/56  Pulse: 93 90 84 94  Temp: 99.7 F (37.6 C) 97.8 F (36.6 C) 98.7 F (37.1 C) 100 F (37.8 C)  TempSrc: Oral Oral Oral Oral  Resp: 18 18 20 18     Height:      Weight: 68.493 kg (151 lb)   67.7 kg (149 lb 4 oz)  SpO2: 97% 98% 99% 96%   Discharge Instructions      Discharge Orders    Future Appointments: Provider: Department: Dept Phone: Center:   07/11/2012 9:30 AM Melonie Florida, OT [facility]  [address]  [facility] [phone] None   07/11/2012 11:30 AM Rexene Agent, PTA [facility]  [address] [facility]  [facility] [phone] None   07/11/2012 1:00 PM Huston Foley, CCC-SLP [facility]  [address] [facility]  [facility] [phone] None     Future Orders Please Complete By Expires   Diet Carb Modified      Increase activity slowly      Discharge instructions      Comments:   Please followup with Benita Stabile, MD (PCP) in 1-2 weeks.  Please followup with Vascular Surgery (Dr. Darrick Penna) in 1 month. Staple to remain in place until patient follows up Dr. Darrick Penna.  Please followup with Dr. Pearlean Brownie (neurology) in 2 month.  Please followup with Dr. Ninetta Lights (ID) in 2-3 weeks.       Medication List     As of 07/11/2012  9:03 AM    STOP taking these  medications         HYDROcodone-acetaminophen 5-325 MG per tablet   Commonly known as: NORCO/VICODIN      TAKE these medications         acetaminophen 325 MG tablet   Commonly known as: TYLENOL   Take 2 tablets (650 mg total) by mouth every 6 (six) hours as needed for pain or fever.      albuterol (5 MG/ML) 0.5% nebulizer solution   Commonly known as: PROVENTIL   Take 0.5 mLs (2.5 mg total) by nebulization every 6 (six) hours as needed for wheezing or shortness of breath.      antiseptic oral rinse Liqd   15 mLs by Mouth Rinse route 2 (two) times daily.      aspirin 325 MG tablet   Take 1 tablet (325 mg total) by mouth daily.      cholestyramine 4 G packet   Commonly known as: QUESTRAN   Take 1 packet (4 g total) by mouth 3 (three) times daily. Till 07/17/2012 then discontinue.      dextrose 5 % SOLN 50 mL with cefTRIAXone 2  G SOLR 2 g   Inject 2 g into the vein daily. Till 08/01/2012      feeding supplement Liqd   Take 30 mLs by mouth 3 (three) times daily with meals.      insulin aspart 100 UNIT/ML injection   Commonly known as: novoLOG   Inject 0-15 Units into the skin 3 (three) times daily with meals. Correction coverage: Moderate (average weight, post-op)  CBG < 70: implement hypoglycemia protocol  CBG 70 - 120: 0 units  CBG 121 - 150: 2 units  CBG 151 - 200: 3 units  CBG 201 - 250: 5 units  CBG 251 - 300: 8 units  CBG 301 - 350: 11 units  CBG 351 - 400: 15 units      insulin glargine 100 UNIT/ML injection   Commonly known as: LANTUS   Inject 14 Units into the skin daily.      ondansetron 4 MG/2ML Soln injection   Commonly known as: ZOFRAN   Inject 2 mLs (4 mg total) into the vein every 6 (six) hours as needed for nausea.      oxyCODONE 5 MG immediate release tablet   Commonly known as: Oxy IR/ROXICODONE   Take 1-2 tablets (5-10 mg total) by mouth every 4 (four) hours as needed for pain.      saccharomyces boulardii 250 MG capsule   Commonly known as: FLORASTOR   Take 1 capsule (250 mg total) by mouth 2 (two) times daily.        Follow-up Information    Follow up with SETHI,PRAMODKUMAR P, MD. Schedule an appointment as soon as possible for a visit in 1 month. (ask for a TCD bubble study with emboli monitoring when you call. you will also see Dr. Pearlean Brownie at that time.)    Contact information:   [address], [address] [facility] [place] [address] [phone]       Follow up with Benita Stabile, MD. Schedule an appointment as soon as possible for a visit in 1 week.   Contact information:   [facility] [address] [place] [address] [phone]       Follow up with Sherren Kerns, MD. Schedule an appointment as soon as possible for a visit in 1 week.   Contact information:   [address] [facility]  [place] [address] [phone]       Follow up with Johny Sax, MD. Schedule an appointment as soon as possible for a visit in 2 weeks.   Contact information:   [address] [place] [address] [phone]           The results of significant diagnostics from this hospitalization (including imaging, microbiology, ancillary and laboratory) are listed below for reference.    Significant Diagnostic Studies: Dg Chest 1 View  07/08/2012  *RADIOLOGY REPORT*  Clinical Data: Status post thoracentesis.  CHEST - 1 VIEW  Comparison: Chest CT 07/08/2012.  Findings: The right-sided PICC line is stable.  The heart is stable.  No definite pleural effusions are identified.  No postprocedural pneumothorax.  IMPRESSION: No definite pleural effusions and no postprocedural pneumothorax.   Original Report Authenticated By: Rudie Meyer, M.D.    Dg Chest 2 View  06/29/2012  *RADIOLOGY REPORT*  Clinical Data: 62 year old male with weakness and swelling.  CHEST - 2 VIEW  Comparison: 10/28/2009 and prior chest radiographs  Findings: The cardiomediastinal silhouette is unremarkable. Mild peribronchial thickening is stable. There is no evidence of focal airspace disease, pulmonary edema, suspicious pulmonary nodule/mass, pleural effusion, or pneumothorax. No acute bony abnormalities are identified.  IMPRESSION: No evidence of active cardiopulmonary disease.   Original Report Authenticated By: Harmon Pier, M.D.    Dg Shoulder Right  06/29/2012  *RADIOLOGY REPORT*  Clinical Data: 62 year old male with right shoulder pain and swelling.  RIGHT SHOULDER - 2+ VIEW  Comparison: 10/28/2009 chest radiograph  Findings: There is no evidence of acute bony abnormality. There is no evidence of acute fracture, subluxation, or dislocation. No focal bony lesions are identified. The visualized right hemithorax is unremarkable.  Moderate degenerative changes at the [facility] joint noted.  Decreased acromiohumeral space may represent a chronic rotator cuff tear.  IMPRESSION:  No evidence of acute bony abnormality.  Moderate AC joint degenerative changes and possible chronic rotator cuff tear.   Original Report Authenticated By: Harmon Pier, M.D.    Dg Forearm Right  06/29/2012  *RADIOLOGY REPORT*  Clinical Data: Right forearm pain and swelling.  RIGHT FOREARM - 2 VIEW  Comparison: None  Findings: No evidence of acute fracture, subluxation or dislocation identified.  No radio-opaque foreign bodies are present.  No focal bony lesions are noted.  The joint spaces are unremarkable.  Vascular calcifications are noted.  IMPRESSION: No evidence of acute bony abnormality.   Original Report Authenticated By: Harmon Pier, M.D.    Ct Head Wo Contrast  07/01/2012  *RADIOLOGY REPORT*  Clinical Data: Right arm weakness.  Rule out stroke.  CT HEAD WITHOUT CONTRAST  Technique:  Contiguous axial images were obtained from the base of the skull through the vertex without contrast.  Comparison: None.  Findings: Mild atrophy.  Negative for acute infarct.  Negative for hemorrhage or mass lesion.  No fluid collection is present. Calvarium is intact.  IMPRESSION: Mild atrophy.  No acute abnormality.   Original Report Authenticated By: Janeece Riggers, M.D.    Ct Angio Chest Pe W/cm &/or Wo Cm  07/08/2012  *RADIOLOGY REPORT*  Clinical Data:   Bilateral upper extremity swelling.  History of colon cancer.  CT ANGIOGRAPHY CHEST CT ABDOMEN AND PELVIS WITH CONTRAST  Technique:  Multidetector CT imaging of the chest was performed using the standard protocol during bolus administration of intravenous contrast.  Multiplanar CT image reconstructions including MIPs were obtained to evaluate the vascular anatomy. Multidetector  CT imaging of the abdomen and pelvis was performed using the standard protocol during bolus administration of intravenous contrast.  Contrast: 80mL OMNIPAQUE IOHEXOL 350 MG/ML SOLN,  Comparison:   None.  CTA  CHEST  Findings: No filling defects in the pulmonary arteries to suggest pulmonary emboli.  Moderate left and large right pleural effusions. Compressive atelectasis in the lower lobes.  No visible pulmonary nodules.  Heart is normal size.  Aorta is normal caliber. No mediastinal, hilar, or axillary adenopathy.  Visualized thyroid and chest wall soft tissues unremarkable.    IMPRESSION:  .Moderate left and large right pleural effusions.  Compressive atelectasis in the lower lobes.  CT ABDOMEN AND PELVIS  Findings: No focal lesion in the liver.  Stomach is mildly distended but otherwise grossly unremarkable.  Spleen, pancreas, adrenals and kidneys are unremarkable.  No hydronephrosis.  Small amount of free fluid adjacent to the liver and in the paracolic gutters and pelvis.  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  No evidence of bowel obstruction.  Foley catheter in place with the bladder decompressed.  Aorta and iliac vessels are heavily calcified, non-aneurysmal.  No free air.  No adenopathy.  No acute bony abnormality.  Fusion across the SI joints. Degenerative changes in the lumbar spine.  Review of the MIP images confirms the above findings.  IMPRESSION: Prior subtotal colectomy.  Small amount of free fluid in the abdomen and pelvis.   Original Report Authenticated By: Charlett Nose, M.D.    Mr [facility] Wo Contrast  07/04/2012  *RADIOLOGY REPORT*  Clinical Data:  62 year old male with slurred speech, difficulty moving right shoulder.  Recent left lower extremity amputation.  Comparison:  Head CT 07/01/2012.  MRI HEAD WITHOUT CONTRAST MRI CERVICAL SPINE WITHOUT CONTRAST  Technique:  Multiplanar, multiecho pulse sequences of the brain and surrounding structures, and cervical spine, to include the craniocervical junction and cervicothoracic junction, were obtained without intravenous contrast.  MRI HEAD  Findings:  Punctate cortical focus of restricted diffusion in the left parietal lobe, posterior to the  sensory strip (series 5 image 23).  No associated T2 or FLAIR hyperintensity.  Possible additional punctate area of restriction in the left occipital lobe white matter (image 13).  There is a small focus of restricted diffusion along the anterior right cerebellar tonsil (series 5 image 5).  There are to other punctate areas of restricted diffusion in the right cerebellum, one involving the right middle cerebellar peduncle (series 5 image 8). Probably also one additional punctate area of restriction in the dorsal medulla also on this image.  Minimal associated T2 and FLAIR hyperintensity.  Diffusion elsewhere is within normal limits. No acute intracranial hemorrhage identified.  No midline shift, mass effect, or evidence of mass lesion.  No ventriculomegaly. Major intracranial vascular flow voids are preserved, MRA findings are below.  Outside of the above findings, there is only mild for age scattered cerebral white matter T2 and FLAIR hyperintensity, mostly subcortical.  Negative pituitary cervicomedullary junction. Cervical spine findings are below.  Visualized orbit soft tissues are within normal limits.  Trace mastoid effusions.  Negative visualized nasopharynx. Mild maxillary and ethmoid sinus mucosal thickening.  Negative scalp soft tissues.  IMPRESSION: 1. 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  Two punctate acute infarcts suspected also in the left occipital and parietal lobe. No mass effect or hemorrhage. Given the distribution, embolic phenomena to the posterior circulation is possible.  The supratentorial findings might also reflect synchronous small vessel disease. 2.  No other acute intracranial abnormality and otherwise largely unremarkable for age MRI appearance of the brain.  3.  Cervical spine and MRA findings are below.  MRI CERVICAL SPINE  Findings: Preserved cervical lordosis. No marrow edema or evidence of acute osseous abnormality.  Subcutaneous edema in the  posterior paraspinal soft tissues, possible mild involvement of the superficial aspect of the underlying erector spinae muscles.  This may be related to prolonged bedrest, the conditioning. Other Visualized paraspinal soft tissues are within normal limits.  Cervicomedullary junction is within normal limits.  Despite spinal stenosis, described below, no cervical spinal cord signal abnormality.  C2-C3:  Negative.  C3-C4:  Mild circumferential disc bulge.  Mild uncovertebral hypertrophy.  Mild bilateral C4 foraminal stenosis.  C4-C5:  Circumferential disc osteophyte complex.  Broad-based central right paracentral posterior component of disc.  Spinal stenosis with mild spinal cord flattening.  Mild left C5 foraminal stenosis.  C5-C6:  Circumferential disc osteophyte complex.  Moderate ligament flavum hypertrophy.  Broad-based posterior component of disc. Spinal stenosis with mild spinal cord flattening.  Mild to moderate bilateral C6 foraminal stenosis.  C6-C7:  Circumferential disc osteophyte complex.  Broad-based left eccentric posterior component of disc.  Moderate ligament flavum hypertrophy.  Spinal stenosis with minimal spinal cord mass effect. Moderate to severe left and mild right C7 foraminal stenosis.  C7-T1:  Mild facet hypertrophy.  Mild left greater than right C8 foraminal stenosis.  Negative visualized upper thoracic levels.  IMPRESSION: 1.  Multifactorial cervical spinal stenosis with mild spinal cord mass effect from C4-C5 to C6-C7.  No spinal cord signal abnormality. 2.  Moderate to severe left C7 foraminal stenosis related to disc and endplate changes. Mild to moderate multifactorial bilateral C6 foraminal stenosis. 3.  MRA findings are below.  MRA HEAD WITHOUT CONTRAST  Technique:  Angiographic images of the Circle of Willis were obtained using MRA technique without intravenous contrast.  Findings:  Antegrade flow in the posterior circulation.  Codominant distal vertebral arteries.  Normal right PICA.   Normal vertebrobasilar junction.  Prominent right AICA also noted.  No left PICA or AICA origin identified. Subsequently, the left SCA might be dominant.  No basilar stenosis.  SCA and PCA origins are normal.  Diminutive posterior communicating arteries.  Bilateral PCA branches are within normal limits.  Antegrade flow in both ICA siphons.  Mild supraclinoid ICA irregularity.  No focal ICA stenosis.  Patent carotid termini.  MCA and ACA origins are within normal limits.  Diminutive or absent anterior communicating artery.  Visualized ACA branches are within normal limits.  Visualized bilateral MCA branches are within normal limits.  IMPRESSION: Negative intracranial MRA.   Original Report Authenticated By: Erskine Speed, M.D.    Mr Brain Wo Contrast  07/04/2012  *RADIOLOGY REPORT*  Clinical Data:  62 year old male with slurred speech, difficulty moving right shoulder.  Recent left lower extremity amputation.  Comparison:  Head CT 07/01/2012.  MRI HEAD WITHOUT CONTRAST MRI CERVICAL SPINE WITHOUT CONTRAST  Technique:  Multiplanar, multiecho pulse sequences of the brain and surrounding structures, and cervical spine, to include the craniocervical junction and cervicothoracic junction, were obtained without intravenous contrast.  MRI HEAD  Findings:  Punctate cortical focus of restricted diffusion in the left parietal lobe, posterior to the sensory strip (series 5 image 23).  No associated T2 or FLAIR hyperintensity.  Possible additional punctate area of restriction in the left occipital lobe white matter (image 13).  There is a small focus of restricted diffusion along the anterior right  cerebellar tonsil (series 5 image 5).  There are to other punctate areas of restricted diffusion in the right cerebellum, one involving the right middle cerebellar peduncle (series 5 image 8). Probably also one additional punctate area of restriction in the dorsal medulla also on this image.  Minimal associated T2 and FLAIR  hyperintensity.  Diffusion elsewhere is within normal limits. No acute intracranial hemorrhage identified.  No midline shift, mass effect, or evidence of mass lesion.  No ventriculomegaly. Major intracranial vascular flow voids are preserved, MRA findings are below.  Outside of the above findings, there is only mild for age scattered cerebral white matter T2 and FLAIR hyperintensity, mostly subcortical.  Negative pituitary cervicomedullary junction. Cervical spine findings are below.  Visualized orbit soft tissues are within normal limits.  Trace mastoid effusions.  Negative visualized nasopharynx. Mild maxillary and ethmoid sinus mucosal thickening.  Negative scalp soft tissues.  IMPRESSION: 1. 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  Two punctate acute infarcts suspected also in the left occipital and parietal lobe. No mass effect or hemorrhage. Given the distribution, embolic phenomena to the posterior circulation is possible.  The supratentorial findings might also reflect synchronous small vessel disease. 2.  No other acute intracranial abnormality and otherwise largely unremarkable for age MRI appearance of the brain.  3.  Cervical spine and MRA findings are below.  MRI CERVICAL SPINE  Findings: Preserved cervical lordosis. No marrow edema or evidence of acute osseous abnormality.  Subcutaneous edema in the posterior paraspinal soft tissues, possible mild involvement of the superficial aspect of the underlying erector spinae muscles.  This may be related to prolonged bedrest, the conditioning. Other Visualized paraspinal soft tissues are within normal limits.  Cervicomedullary junction is within normal limits.  Despite spinal stenosis, described below, no cervical spinal cord signal abnormality.  C2-C3:  Negative.  C3-C4:  Mild circumferential disc bulge.  Mild uncovertebral hypertrophy.  Mild bilateral C4 foraminal stenosis.  C4-C5:  Circumferential disc osteophyte complex.   Broad-based central right paracentral posterior component of disc.  Spinal stenosis with mild spinal cord flattening.  Mild left C5 foraminal stenosis.  C5-C6:  Circumferential disc osteophyte complex.  Moderate ligament flavum hypertrophy.  Broad-based posterior component of disc. Spinal stenosis with mild spinal cord flattening.  Mild to moderate bilateral C6 foraminal stenosis.  C6-C7:  Circumferential disc osteophyte complex.  Broad-based left eccentric posterior component of disc.  Moderate ligament flavum hypertrophy.  Spinal stenosis with minimal spinal cord mass effect. Moderate to severe left and mild right C7 foraminal stenosis.  C7-T1:  Mild facet hypertrophy.  Mild left greater than right C8 foraminal stenosis.  Negative visualized upper thoracic levels.  IMPRESSION: 1.  Multifactorial cervical spinal stenosis with mild spinal cord mass effect from C4-C5 to C6-C7.  No spinal cord signal abnormality. 2.  Moderate to severe left C7 foraminal stenosis related to disc and endplate changes. Mild to moderate multifactorial bilateral C6 foraminal stenosis. 3.  MRA findings are below.  MRA HEAD WITHOUT CONTRAST  Technique:  Angiographic images of the Circle of Willis were obtained using MRA technique without intravenous contrast.  Findings:  Antegrade flow in the posterior circulation.  Codominant distal vertebral arteries.  Normal right PICA.  Normal vertebrobasilar junction.  Prominent right AICA also noted.  No left PICA or AICA origin identified. Subsequently, the left SCA might be dominant.  No basilar stenosis.  SCA and PCA origins are normal.  Diminutive posterior communicating arteries.  Bilateral PCA branches are within  normal limits.  Antegrade flow in both ICA siphons.  Mild supraclinoid ICA irregularity.  No focal ICA stenosis.  Patent carotid termini.  MCA and ACA origins are within normal limits.  Diminutive or absent anterior communicating artery.  Visualized ACA branches are within normal limits.   Visualized bilateral MCA branches are within normal limits.  IMPRESSION: Negative intracranial MRA.   Original Report Authenticated By: Erskine Speed, M.D.    Mr Cervical Spine Wo Contrast  07/04/2012  *RADIOLOGY REPORT*  Clinical Data:  62 year old male with slurred speech, difficulty moving right shoulder.  Recent left lower extremity amputation.  Comparison:  Head CT 07/01/2012.  MRI HEAD WITHOUT CONTRAST MRI CERVICAL SPINE WITHOUT CONTRAST  Technique:  Multiplanar, multiecho pulse sequences of the brain and surrounding structures, and cervical spine, to include the craniocervical junction and cervicothoracic junction, were obtained without intravenous contrast.  MRI HEAD  Findings:  Punctate cortical focus of restricted diffusion in the left parietal lobe, posterior to the sensory strip (series 5 image 23).  No associated T2 or FLAIR hyperintensity.  Possible additional punctate area of restriction in the left occipital lobe white matter (image 13).  There is a small focus of restricted diffusion along the anterior right cerebellar tonsil (series 5 image 5).  There are to other punctate areas of restricted diffusion in the right cerebellum, one involving the right middle cerebellar peduncle (series 5 image 8). Probably also one additional punctate area of restriction in the dorsal medulla also on this image.  Minimal associated T2 and FLAIR hyperintensity.  Diffusion elsewhere is within normal limits. No acute intracranial hemorrhage identified.  No midline shift, mass effect, or evidence of mass lesion.  No ventriculomegaly. Major intracranial vascular flow voids are preserved, MRA findings are below.  Outside of the above findings, there is only mild for age scattered cerebral white matter T2 and FLAIR hyperintensity, mostly subcortical.  Negative pituitary cervicomedullary junction. Cervical spine findings are below.  Visualized orbit soft tissues are within normal limits.  Trace mastoid effusions.  Negative  visualized nasopharynx. Mild maxillary and ethmoid sinus mucosal thickening.  Negative scalp soft tissues.  IMPRESSION: 1. 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  Two punctate acute infarcts suspected also in the left occipital and parietal lobe. No mass effect or hemorrhage. Given the distribution, embolic phenomena to the posterior circulation is possible.  The supratentorial findings might also reflect synchronous small vessel disease. 2.  No other acute intracranial abnormality and otherwise largely unremarkable for age MRI appearance of the brain.  3.  Cervical spine and MRA findings are below.  MRI CERVICAL SPINE  Findings: Preserved cervical lordosis. No marrow edema or evidence of acute osseous abnormality.  Subcutaneous edema in the posterior paraspinal soft tissues, possible mild involvement of the superficial aspect of the underlying erector spinae muscles.  This may be related to prolonged bedrest, the conditioning. Other Visualized paraspinal soft tissues are within normal limits.  Cervicomedullary junction is within normal limits.  Despite spinal stenosis, described below, no cervical spinal cord signal abnormality.  C2-C3:  Negative.  C3-C4:  Mild circumferential disc bulge.  Mild uncovertebral hypertrophy.  Mild bilateral C4 foraminal stenosis.  C4-C5:  Circumferential disc osteophyte complex.  Broad-based central right paracentral posterior component of disc.  Spinal stenosis with mild spinal cord flattening.  Mild left C5 foraminal stenosis.  C5-C6:  Circumferential disc osteophyte complex.  Moderate ligament flavum hypertrophy.  Broad-based posterior component of disc. Spinal stenosis with mild spinal cord  flattening.  Mild to moderate bilateral C6 foraminal stenosis.  C6-C7:  Circumferential disc osteophyte complex.  Broad-based left eccentric posterior component of disc.  Moderate ligament flavum hypertrophy.  Spinal stenosis with minimal spinal cord mass effect.  Moderate to severe left and mild right C7 foraminal stenosis.  C7-T1:  Mild facet hypertrophy.  Mild left greater than right C8 foraminal stenosis.  Negative visualized upper thoracic levels.  IMPRESSION: 1.  Multifactorial cervical spinal stenosis with mild spinal cord mass effect from C4-C5 to C6-C7.  No spinal cord signal abnormality. 2.  Moderate to severe left C7 foraminal stenosis related to disc and endplate changes. Mild to moderate multifactorial bilateral C6 foraminal stenosis. 3.  MRA findings are below.  MRA HEAD WITHOUT CONTRAST  Technique:  Angiographic images of the Circle of Willis were obtained using MRA technique without intravenous contrast.  Findings:  Antegrade flow in the posterior circulation.  Codominant distal vertebral arteries.  Normal right PICA.  Normal vertebrobasilar junction.  Prominent right AICA also noted.  No left PICA or AICA origin identified. Subsequently, the left SCA might be dominant.  No basilar stenosis.  SCA and PCA origins are normal.  Diminutive posterior communicating arteries.  Bilateral PCA branches are within normal limits.  Antegrade flow in both ICA siphons.  Mild supraclinoid ICA irregularity.  No focal ICA stenosis.  Patent carotid termini.  MCA and ACA origins are within normal limits.  Diminutive or absent anterior communicating artery.  Visualized ACA branches are within normal limits.  Visualized bilateral MCA branches are within normal limits.  IMPRESSION: Negative intracranial MRA.   Original Report Authenticated By: Erskine Speed, M.D.    Ct Abdomen Pelvis W Contrast  07/08/2012  *RADIOLOGY REPORT*  Clinical Data:   Bilateral upper extremity swelling.  History of colon cancer.  CT ANGIOGRAPHY CHEST CT ABDOMEN AND PELVIS WITH CONTRAST  Technique:  Multidetector CT imaging of the chest was performed using the standard protocol during bolus administration of intravenous contrast.  Multiplanar CT image reconstructions including MIPs were obtained to evaluate  the vascular anatomy. Multidetector CT imaging of the abdomen and pelvis was performed using the standard protocol during bolus administration of intravenous contrast.  Contrast: 80mL OMNIPAQUE IOHEXOL 350 MG/ML SOLN,  Comparison:   None.  CTA CHEST  Findings: No filling defects in the pulmonary arteries to suggest pulmonary emboli.  Moderate left and large right pleural effusions. Compressive atelectasis in the lower lobes.  No visible pulmonary nodules.  Heart is normal size.  Aorta is normal caliber. No mediastinal, hilar, or axillary adenopathy.  Visualized thyroid and chest wall soft tissues unremarkable.    IMPRESSION:  .Moderate left and large right pleural effusions.  Compressive atelectasis in the lower lobes.  CT ABDOMEN AND PELVIS  Findings: No focal lesion in the liver.  Stomach is mildly distended but otherwise grossly unremarkable.  Spleen, pancreas, adrenals and kidneys are unremarkable.  No hydronephrosis.  Small amount of free fluid adjacent to the liver and in the paracolic gutters and pelvis.  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  No evidence of bowel obstruction.  Foley catheter in place with the bladder decompressed.  Aorta and iliac vessels are heavily calcified, non-aneurysmal.  No free air.  No adenopathy.  No acute bony abnormality.  Fusion across the SI joints. Degenerative changes in the lumbar spine.  Review of the MIP images confirms the above findings.  IMPRESSION: Prior subtotal colectomy.  Small amount of free fluid in the abdomen and pelvis.   Original  Report Authenticated By: Charlett Nose, M.D.    Dg Chest Port 1 View  07/07/2012  *RADIOLOGY REPORT*  Clinical Data: Line placement  PORTABLE CHEST - 1 VIEW  Comparison: Chest radiograph 07/02/2012  Findings: Interval placement of a right PICC line with the tip in the distal SVC / cavoatrial junction.  Right central venous line is unchanged.  Interval extubation.  Stable cardiac silhouette.  Lungs are clear.  IMPRESSION:  Interval placement of right PICC line with tip at the cavoatrial junction.   Original Report Authenticated By: Genevive Bi, M.D.    Dg Chest Port 1 View  07/02/2012  *RADIOLOGY REPORT*  Clinical Data: Advancement of endotracheal tube  PORTABLE CHEST - 1 VIEW  Comparison: Portable exam 0626 hours compared to 07/02/2012 at 0514 hours  Findings: Tip of endotracheal tube now 2.0 cm above carina. Nasogastric tube and right jugular line unchanged. Stable heart size. Persistent atelectasis versus consolidation left lower lobe. Mild right basilar atelectasis. Upper lungs clear.  IMPRESSION: Tip of endotracheal tube now 2.0 cm above carina. Remainder exam unchanged.   Original Report Authenticated By: Ulyses Southward, M.D.    Dg Chest Port 1 View  07/02/2012  *RADIOLOGY REPORT*  Clinical Data: Follow up atelectasis  PORTABLE CHEST - 1 VIEW  Comparison: Portable exam 0514 hours compared to 07/01/2012  Findings: Tip of endotracheal tube in cervical region, 11.8 cm above carina. Right jugular central venous catheter tip projecting over SVC. Nasogastric tube extends into stomach. Numerous cardiac monitoring leads project over chest. Upper-normal size of cardiac silhouette. Mediastinal contours and pulmonary vascularity normal. Minimal right basilar atelectasis. Slightly increased atelectasis versus consolidation in medial left lower lobe. No definite pleural effusion or pneumothorax.  IMPRESSION: Minimal right basilar atelectasis with increased atelectasis versus consolidation in left lower lobe. High position of endotracheal tube 11.8 cm above carina; may consider advancing tube 6 cm for more stable position within thoracic trachea.   Original Report Authenticated By: Ulyses Southward, M.D.    Dg Chest Port 1 View  07/01/2012  *RADIOLOGY REPORT*  Clinical Data: Acute respiratory failure on ventilator.  Colon carcinoma.  PORTABLE CHEST - 1 VIEW  Comparison: 06/30/2012  Findings: Endotracheal tube remains high in position with the  tip approximately 11 cm above the carina.  Both lungs are clear.  Heart size is normal.  Right jugular center venous catheter remains in appropriate position.  IMPRESSION:  1.  High endotracheal tube position, with tip approximately 11 cm above carina. 2.  No active lung disease.   Original Report Authenticated By: Myles Rosenthal, M.D.    Dg Chest Port 1 View  06/30/2012  *RADIOLOGY REPORT*  Clinical Data: Endotracheal placement  PORTABLE CHEST - 1 VIEW  Comparison: Same day  Findings: Endotracheal tip is 9 cm above the carina, at the thoracic inlet.  Right internal jugular central line is in the SVC at the azygos level.  Minimal atelectasis again noted in the left lower lobe.  No other change.  IMPRESSION: Endotracheal tube somewhat high, 9 cm above the carina at the thoracic inlet.   Original Report Authenticated By: Paulina Fusi, M.D.    Dg Chest Portable 1 View  06/30/2012  *RADIOLOGY REPORT*  Clinical Data: Central line placement.  PORTABLE CHEST - 1 VIEW  Comparison: Chest radiograph performed 06/29/2012  Findings: The lungs are well-aerated.  Mild right basilar opacity may reflect atelectasis or possibly pneumonia.  There is no evidence of pleural effusion or pneumothorax.  The cardiomediastinal silhouette is within normal limits.  No acute osseous abnormalities are seen.  A right IJ line is noted ending about the mid SVC.  IMPRESSION:  1.  Right IJ line noted ending about the mid SVC. 2.  Mild right basilar airspace opacity may reflect atelectasis or possibly pneumonia.   Original Report Authenticated By: Tonia Ghent, M.D.    Dg Foot Complete Left  06/29/2012  *RADIOLOGY REPORT*  Clinical Data: 62 year old male with left foot pain and wounds on left foot.  LEFT FOOT - COMPLETE 3+ VIEW  Comparison: None  Findings: Soft tissue gas and swelling is identified at the level of the lateral MTP joints. There is no evidence of acute fracture, subluxation or dislocation. The Lisfranc joints are intact. No focal  bony lesions are present except for a large plantar calcaneal spur. Vascular calcifications are present.  IMPRESSION: Soft tissue gas and swelling overlying the distal foot suspicious either for open wound or gas forming infection.  No radiographic evidence of osteomyelitis.  No acute bony abnormalities identified.  Calcaneal spur and vascular calcifications.   Original Report Authenticated By: Harmon Pier, M.D.    US Thoracentesis Asp Pleural Space W/img Guide  07/09/2012  *RADIOLOGY REPORT*  Clinical Data:  New onset bilateral pleural effusions.  History of colon cancer.  Request has been made for therapeutic and diagnostic thoracentesis.  ULTRASOUND GUIDED right THORACENTESIS  Comparison:  Prior chest x-ray and CT scan of the chest.  An ultrasound guided thoracentesis was thoroughly discussed with the patient and questions answered.  The benefits, risks, alternatives and complications were also discussed.  The patient understands and wishes to proceed with the procedure.  Written consent was obtained.  Ultrasound was performed to localize and mark an adequate pocket of fluid in the right chest.  The area was then prepped and draped in the normal sterile fashion.  1% Lidocaine was used for local anesthesia.  Under ultrasound guidance a 19 gauge Yueh catheter was introduced.  Thoracentesis was performed.  The catheter was removed and a dressing applied.  Complications:  None immediate  Findings: A total of approximately 210 ml of amber-colored fluid was removed. A fluid sample was sent for laboratory analysis.  IMPRESSION: Successful ultrasound guided right thoracentesis yielding 210 ml of pleural fluid.  Read by: Anselm Pancoast, P.A.-C   Original Report Authenticated By: Judie Petit. Miles Costain, M.D.     Microbiology: Recent Results (from the past 240 hour(s))  CULTURE, BLOOD (ROUTINE X 2)     Status: Normal   Collection Time   07/03/12  6:06 PM      Component Value Range Status Comment   Specimen Description BLOOD  LEFT THUMB   Final    Special Requests BOTTLES DRAWN AEROBIC ONLY 2CC   Final    Culture  Setup Time 07/04/2012 01:53   Final    Culture NO GROWTH 5 DAYS   Final    Report Status 07/10/2012 FINAL   Final   CLOSTRIDIUM DIFFICILE BY PCR     Status: Normal   Collection Time   07/04/12  4:00 AM      Component Value Range Status Comment   C difficile by pcr NEGATIVE  NEGATIVE Final   BODY FLUID CULTURE     Status: Normal (Preliminary result)   Collection Time   07/08/12  2:56 PM      Component Value Range Status Comment   Specimen Description PLEURAL RIGHT FLUID   Final    Special Requests NONE   Final    Gram Stain  Final    Value: WBC PRESENT,BOTH PMN AND MONONUCLEAR     NO ORGANISMS SEEN   Culture NO GROWTH 2 DAYS   Final    Report Status PENDING   Incomplete      Labs: Basic Metabolic Panel:  Lab 07/09/12 5784 07/08/12 0500 07/07/12 0510 07/06/12 0404 07/05/12 0415  NA 136 132* 134* 135 137  K 3.6 3.6 4.1 3.6 2.8*  CL 109 104 104 100 97  CO2 17* 19 23 27 30   GLUCOSE 141* 185* 190* 223* 188*  BUN 29* 26* 30* 44* 62*  CREATININE 1.20 1.19 1.14 1.38* 1.93*  CALCIUM 7.3* 6.7* 6.5* 6.7* 7.5*  MG -- -- 1.7 1.4* --  PHOS -- -- 2.2* -- --   Liver Function Tests: No results found for this basename: AST:5,ALT:5,ALKPHOS:5,BILITOT:5,PROT:5,ALBUMIN:5 in the last 168 hours No results found for this basename: LIPASE:5,AMYLASE:5 in the last 168 hours No results found for this basename: AMMONIA:5 in the last 168 hours CBC:  Lab 07/09/12 0825 07/08/12 0500 07/06/12 1100 07/05/12 0415  WBC 11.1* 11.6* 9.1 8.7  NEUTROABS -- -- -- --  HGB 8.6* 9.1* 9.0* 9.6*  HCT 26.0* 27.3* 26.7* 27.8*  MCV 86.1 84.5 84.2 81.5  PLT 276 198 87* 86*   Cardiac Enzymes: No results found for this basename: CKTOTAL:5,CKMB:5,CKMBINDEX:5,TROPONINI:5 in the last 168 hours BNP: BNP (last 3 results) No results found for this basename: PROBNP:3 in the last 8760 hours CBG:  Lab 07/10/12 2328 07/10/12 1729  07/10/12 1233 07/10/12 0704 07/09/12 2132  GLUCAP 139* 156* 247* 113* 181*       Signed:  Steffen Hase A  [facility] 07/11/2012, 9:03 AM

## 2012-07-10 NOTE — Progress Notes (Signed)
Today's PT & OT notes faxed to insurance to show pt's progress [insurance earlier denied d/t "needing total assist & lack of progress"].  Dr. Betti Cruz also spoke w/ insurance MD.  Now waiting for insurance response to reconsideration of pt for CIR. [phone]

## 2012-07-10 NOTE — Progress Notes (Signed)
Physical Therapy Treatment Patient Details Name: Tyler Erickson MRN: [id number] DOB: 1951/06/18 Today's Date: 07/10/2012 Time: 8295-6213 PT Time Calculation (min): 28 min  PT Assessment / Plan / Recommendation Comments on Treatment Session  Pt con't to demo progress. Would like to trial platform walker due to pt able to WB through R forearm/elbow but not hand. Pt con't to benefit from CIR to maximize functional recovery. Patient with improved R LE strength and standing tolerance.    Follow Up Recommendations  CIR     Does the patient have the potential to tolerate intense rehabilitation     Barriers to Discharge        Equipment Recommendations   (TBD)    Recommendations for Other Services    Frequency Min 3X/week   Plan Discharge plan remains appropriate;Frequency needs to be updated    Precautions / Restrictions Precautions Precautions: Fall Precaution Comments: RUE with edema restricting movement throughout, LUE with edema with minimal movement restrictions. 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 Restrictions Weight Bearing Restrictions: Yes LLE Weight Bearing: Non weight bearing   Pertinent Vitals/Pain Denies pain    Mobility  Bed Mobility Bed Mobility: Rolling Right;Right Sidelying to Sit;Sitting - Scoot to [place] of Bed Rolling Right: 3: Mod assist;With rail Right Sidelying to Sit: 2: Max assist;With rails;HOB elevated Sitting - Scoot to [place] of Bed: 3: Mod assist Details for Bed Mobility Assistance: increased assist due to minimal use of R UE Transfers Transfers: Sit to Stand;Stand to Sit;Stand Pivot Transfers Sit to Stand: 1: +2 Total assist;From elevated surface;With upper extremity assist;From bed Sit to Stand: Patient Percentage: 50% Stand to Sit: 1: +2 Total assist;With upper extremity assist;To elevated surface;To bed Stand to Sit: Patient Percentage: 50% Stand Pivot Transfers: 1: +2  Total assist Stand Pivot Transfers: Patient Percentage: 60% Details for Transfer Assistance: pt able to hop on R LE x 3 to chair Ambulation/Gait Ambulation/Gait Assistance: Not tested (comment) (platform walker unavailable)    Exercises Total Joint Exercises Long Arc Quad: AROM;Right;15 reps;Seated (with 10 sec hold) General Exercises - Lower Extremity Heel Raises: AROM;Right;10 reps;Standing (x2 sets) Other Exercises Other Exercises: worked on standing tolerance   PT Diagnosis:    PT Problem List:   PT Treatment Interventions:     PT Goals Acute Rehab PT Goals PT Goal: Supine/Side to Sit - Progress: Progressing toward goal PT Goal: Sit at Edge Of Bed - Progress: Progressing toward goal PT Goal: Sit to Supine/Side - Progress: Progressing toward goal PT Goal: Sit to Stand - Progress: Progressing toward goal PT Goal: Stand to Sit - Progress: Progressing toward goal PT Transfer Goal: Bed to Chair/Chair to Bed - Progress: Progressing toward goal  Visit Information  Last PT Received On: 07/10/12 Assistance Needed: +2    Subjective Data  Subjective: pt received supine in bed. patient very sweat reports "i've been sweating since I ate."   Cognition  Overall Cognitive Status: Appears within functional limits for tasks assessed/performed Arousal/Alertness: Awake/alert Orientation Level: Appears intact for tasks assessed Behavior During Session: [facility] for tasks performed    Balance  Static Standing Balance Static Standing - Balance Support: Bilateral upper extremity supported Static Standing - Level of Assistance: 1: +2 Total assist Static Standing - Comment/# of Minutes: x 3, 30-60 second eash, modAx2 to maintain standing, worked on heal raises  End of Session PT - End of Session Equipment Utilized During Treatment: Gait belt Activity Tolerance: Patient tolerated treatment  well Patient left: in chair;with call bell/phone within reach;with family/visitor present Nurse  Communication: Mobility status   GP     Marcene Brawn 07/10/2012, 11:02 AM   Lewis Shock, PT, DPT Pager #: [phone] Office #: [phone]

## 2012-07-10 NOTE — Clinical Social Work Note (Signed)
Clinical Social Work   CSW met with pt and family to address discharge plan. CIR is still persuing insurance auth for [employer]. However, as a back up, pt's family have chosen [facility] for SNF placement. CSW updated facility and sent needed clinicals. CSW also updated [facility], Ladon Applebaum. CSW will continue to follow to facilitate discharge to SNF, if necessary.   Dede Query, MSW, Theresia Majors [phone]

## 2012-07-10 NOTE — Progress Notes (Signed)
Continue to await insurance response to request for CIR coverage.  Also, need pt to be medically stable.  Will f/up. [phone]

## 2012-07-10 NOTE — Progress Notes (Signed)
Occupational Therapy Treatment Patient Details Name: Tyler Erickson MRN: [id number] DOB: 1951/03/07 Today's Date: 07/10/2012 Time: 4098-1191 OT Time Calculation (min): 31 min  OT Assessment / Plan / Recommendation Comments on Treatment Session Pt. with improved edema bil. UEs.  Pt's ability to perform/participate in BADLs limted by ROM deficits caused by edema and weakness Rt. UE.  Pt  would benefit from edema wraps bil. UEs.  Pt motivated, and would benefit from CIR to allow him to return to min A - modified independent level with BADLs     Follow Up Recommendations  CIR    Barriers to Discharge       Equipment Recommendations  Wheelchair cushion (measurements OT);Wheelchair (measurements OT);3 in 1 bedside comode    Recommendations for Other Services Rehab consult  Frequency Min 3X/week   Plan Discharge plan remains appropriate    Precautions / Restrictions Precautions Precautions: Fall Precaution Comments: RUE with edema restricting movement throughout, LUE with edema with minimal movement restrictions. 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 Restrictions Weight Bearing Restrictions: Yes LLE Weight Bearing: Non weight bearing   Pertinent Vitals/Pain     ADL  Eating/Feeding: Minimal assistance (with Lt. UE; Pt. is Rt. hand dominant) Where Assessed - Eating/Feeding: Chair Grooming: Teeth care;Moderate assistance Where Assessed - Grooming: Supported sitting Upper Body Bathing: Maximal assistance Where Assessed - Upper Body Bathing: Supported sitting Lower Body Bathing: Maximal assistance Where Assessed - Lower Body Bathing: Supported sit to stand Upper Body Dressing: +1 Total assistance Where Assessed - Upper Body Dressing: Supported sitting Lower Body Dressing: +1 Total assistance Where Assessed - Lower Body Dressing: Supported sit to Pharmacist, hospital: +2 Total assistance Toilet Transfer:  Patient Percentage: 50% Statistician Method: Sit to Barista: Bedside commode Toileting - Clothing Manipulation and Hygiene: +1 Total assistance Where Assessed - Glass blower/designer Manipulation and Hygiene: Standing Transfers/Ambulation Related to ADLs: Pt performs stand pivot transfers with total A + 2 (pt ~50%) ADL Comments: Pt. with 2-3+ edema Bil. UEs (variable throughout).  Pt. is unable to perform any BADLs tasks with Rt. UE and he is Rt. hand domininat.  Pt. demonstrates Rt. UE shoulder flexion to ~90*; Elbow flexion to ~90*; sup/pron limited ~50*; wrist flex/ext 1/5; Pt. demonstrates ~30* finger flexion with only ~10* extension.   Pt. with tightness noted PIPs, but still with full PROM after gentle stretch.  Pt encouraged to perform AROM bil. UEs to assist with edema management.  bil. UEs elevated on 2 pillows and retrograde massage performed Lt. UE    OT Diagnosis:    OT Problem List:   OT Treatment Interventions:     OT Goals ADL Goals ADL Goal: Grooming - Progress: Progressing toward goals ADL Goal: Upper Body Bathing - Progress: Progressing toward goals ADL Goal: Upper Body Dressing - Progress: Progressing toward goals ADL Goal: Toilet Transfer - Progress: Progressing toward goals Arm Goals Arm Goal: Additional Goal #1 - Progress: Progressing toward goals  Visit Information  Last OT Received On: 07/10/12 Assistance Needed: +2    Subjective Data      Prior Functioning       Cognition  Overall Cognitive Status: Appears within functional limits for tasks assessed/performed Arousal/Alertness: Awake/alert Orientation Level: Appears intact for tasks assessed Behavior During Session: [facility] for tasks performed Current Attention Level: Sustained    Mobility  Shoulder Instructions         Exercises  General Exercises - Upper  Extremity Shoulder Flexion: AAROM;AROM;Right;Left;10 reps;Seated;Limitations Shoulder Flexion Limitations: Rt. AAROM  limited to ~115* Elbow Flexion: AROM;AAROM;Right;Left;Seated;Limitations Elbow Flexion Limitations: AAROM limited to 95* Rt; Lt. limited to ~75* Elbow Extension: AROM;AAROM;10 reps;Seated;Limitations Elbow Extension Limitations: `-10 Wrist Flexion: AROM;AAROM;Left;Right;10 reps;Seated Wrist Extension: AROM;AAROM;Left;Both;10 reps;Seated Digit Composite Flexion: AAROM;PROM;Right;10 reps;Seated;Limitations Composite Flexion Limitations: ~40* on AROM Composite Extension: PROM;AAROM;Right;10 reps;Seated;Limitations Composite Extension Limitations: limited AROM finger extension Hand Exercises Forearm Supination: AAROM;PROM;10 reps;Seated Forearm Pronation: PROM;AAROM;Seated;10 reps   Balance     End of Session OT - End of Session Activity Tolerance: Patient tolerated treatment well Patient left: in chair;with call bell/phone within reach  GO     Dilana Mcphie M 07/10/2012, 2:59 PM

## 2012-07-10 NOTE — Progress Notes (Signed)
[facility] PROGRESS NOTE  Tyler Erickson AVW:[id number] DOB: 04-12-1951 DOA: 06/29/2012 PCP: Benita Stabile, MD  Brief narrative:  62 y.o. male with a PMH of DM, diabetic neuropathy, stage III colon cancer status post subtotal colectomy and chemotherapy (11 cycles of FOLFOX) who presented to the ER 06/29/2012 with generalized weakness and left foot pain. He had not seen a doctor in over 2 years due to financial issues. The patient stated his LLE has been discolored and tender for about a week. No fever, but reported subjective chills. He had not checked his glucoses in 2 years. Pt found to have L foot gangrene and sepsis with positive blood cx. Now S/P L BKA.   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 Pt s/p MRI found to have multiple b/l watershed infarcts. MRI cervical spine demonstrated multifactorial cervical spinal stenosis with mild spinal cord mass effect from C4-C5 to C6-C7 without spinal cord signal abnormality. In addition there was moderate to severe left C7 foraminal stenosis related to disc and endplate changes.  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 Followed by Vascular Surgery.   Group A strep bacteremia / Septic shock  Total 6 wks of antibiotics. Currently on Rocephin. ID following. PICC line on 07/07/2012.   Right hand pain/weakness etiology unclear cervical spinal stenosis with mild mass effect  Compartment pressures determine by Ortho in OR and not found to be elevated. Patient has multifocal cervical spinal stenosis with mild spinal cord mass effect, moderate to severe left C7 foraminal stenosis. On imaging though patient has bilateral cervical spinal stenosis worse on the left with patient's symptoms on the right.  Patient on exam was able to raise his hand parallel to the ground. When passively raised his hand above his head he was able to maintain his hand  elevated for extended period of time. Suspect functional component. Patient not endorsing any paresthesias or shoulder pain to suspect cervical spine or brachial plexus etiology.   Left upper extremity edema  Left upper extremity venous Dopplers negative for DVT. Given significant bilateral upper extremity edema. CT of chest, abdomen and pelvis did not show any etiology for edema.   Pleural effusions  CT chest showed moderate left and large right pleural effusions with compressive atelectasis in the lower lobes. Thoracentesis yielded exudative fluid.  Gram stain and culture shows many WBCs but no organisms.  Suspect effusion may be related to his infection and severe protein calorie malnutrition.  Cytology on the pleural fluid pending given history of cancer.   Embolic infarcts  TEE showed no evidence of cardiac vegetation or emboli. Neurology recommends full dose aspirin daily for stroke prophylaxis. Neurology also recommends transcranial Doppler bubble study with emboli monitoring with Dr. Pearlean Brownie in one month to further evaluate or possible PFO as outpatient.   Diarrhea/hypomagnesemia  Probiotics started by ID. H/o colon CA last follow up CT was 2011. Continue cholestyramine. C. difficile by PCR negative on 07/04/2012. CT abdomen and pelvis done with results as indicated below.   DM  Only takes metformin at home but sugars run in 300s. Hemoglobin A1C 10.1. Further titration depending on patient's clinical course.  Currently only on sliding scale insulin.  AKI (acute kidney injury) on chronic kidney disease stage II  Resolved.  Anasarca  Due to hypoalbuminemia and sepsis. Appears to have resolved w/ diuretics and albumin.   Severe protein-calorie malnutrition  Appetite improving. Continue supplemental nutrition.   Hypokalemia  Replace as needed.  Code Status: full code  Family Communication: No family at bedside.  Disposition Plan: DC to SNF versus CIR today.  DVT prophylaxis: Heparin    Consultants:  Vasc Surgery  ID  Cardiology for TEE  Neurology  Orthopedics - [facility]  LINES / TUBES:  1/03 R IJ CVL >> 1/10  1/03 ETT >> 1/5  1/03 Rt femoral Aline  1/10 R PICC Line >>  CULTURES:  1/2 blood >> GPC in chains >> Group A strep.  1/2 urine >> negative  1/6 blood >> NGTD  ANTIBIOTICS:  Clindamycin 1/2 >> 1/6  Zosyn 1/2 >> 1/6  Vancomycin 1/3 >> 1/3  Rocephin 1/6 >>   Procedures:  1/03 RUE arteriogram >> Normal arch anatomy, diffusely atherosclerotic right upper extremity arterial tree unreconstructable  1/03 - L BKA  1/8: TEE >> normal LV function, trace MR without vegetations, no atrial thrombus, no PFO  1/11: Thoracentesis >> exudative fluid  HPI/Subjective: Feeling better.  No specific concerns, except arms still being swollen.  Objective: Filed Vitals:   07/09/12 0943 07/09/12 1903 07/09/12 2129 07/10/12 0520  BP: 119/54 123/56 127/61 123/53  Pulse: 94 82 87 93  Temp: 98.8 F (37.1 C) 97.9 F (36.6 C) 99.5 F (37.5 C) 99.7 F (37.6 C)  TempSrc: Oral Oral Oral Oral  Resp: 18 18 17 18   Height:      Weight:  70.1 kg (154 lb 8.7 oz)  68.493 kg (151 lb)  SpO2: 98% 100% 100% 97%    Intake/Output Summary (Last 24 hours) at 07/10/12 0837 Last data filed at 07/10/12 1610  Gross per 24 hour  Intake    360 ml  Output    700 ml  Net   -340 ml   Filed Weights   07/09/12 0500 07/09/12 1903 07/10/12 0520  Weight: 70.3 kg (154 lb 15.7 oz) 70.1 kg (154 lb 8.7 oz) 68.493 kg (151 lb)    Exam: Physical Exam: General: Awake, Oriented, No acute distress. HEENT: EOMI. Neck: Supple CV: S1 and S2 Lungs: Clear to ascultation bilaterally Abdomen: Soft, Nontender, Nondistended, +bowel sounds. Ext: Good pulses. Trace edema.  Status post left BKA.  Left upper extremity swollen.  Right and left upper extremity covered in bandages on his forearms  Data Reviewed: Basic Metabolic Panel:  Lab 07/09/12 9604 07/08/12 0500 07/07/12 0510 07/06/12 0404  07/05/12 0415  NA 136 132* 134* 135 137  K 3.6 3.6 4.1 3.6 2.8*  CL 109 104 104 100 97  CO2 17* 19 23 27 30   GLUCOSE 141* 185* 190* 223* 188*  BUN 29* 26* 30* 44* 62*  CREATININE 1.20 1.19 1.14 1.38* 1.93*  CALCIUM 7.3* 6.7* 6.5* 6.7* 7.5*  MG -- -- 1.7 1.4* --  PHOS -- -- 2.2* -- --   Liver Function Tests: No results found for this basename: AST:5,ALT:5,ALKPHOS:5,BILITOT:5,PROT:5,ALBUMIN:5 in the last 168 hours No results found for this basename: LIPASE:5,AMYLASE:5 in the last 168 hours No results found for this basename: AMMONIA:5 in the last 168 hours CBC:  Lab 07/09/12 0825 07/08/12 0500 07/06/12 1100 07/05/12 0415  WBC 11.1* 11.6* 9.1 8.7  NEUTROABS -- -- -- --  HGB 8.6* 9.1* 9.0* 9.6*  HCT 26.0* 27.3* 26.7* 27.8*  MCV 86.1 84.5 84.2 81.5  PLT 276 198 87* 86*   Cardiac Enzymes: No results found for this basename: CKTOTAL:5,CKMB:5,CKMBINDEX:5,TROPONINI:5 in the last 168 hours BNP (last 3 results) No results found for this basename: PROBNP:3 in the last 8760 hours CBG:  Lab 07/10/12 0704 07/09/12 2132 07/09/12 1621 07/09/12 1151 07/09/12 0701  GLUCAP 113* 181* 178* 147* 129*    Recent Results (from the past 240 hour(s))  SURGICAL PCR SCREEN     Status: Normal   Collection Time   06/30/12 10:08 AM      Component Value Range Status Comment   MRSA, PCR NEGATIVE  NEGATIVE Final    Staphylococcus aureus NEGATIVE  NEGATIVE Final   CULTURE, BLOOD (ROUTINE X 2)     Status: Normal   Collection Time   07/03/12  6:06 PM      Component Value Range Status Comment   Specimen Description BLOOD LEFT THUMB   Final    Special Requests BOTTLES DRAWN AEROBIC ONLY 2CC   Final    Culture  Setup Time 07/04/2012 01:53   Final    Culture NO GROWTH 5 DAYS   Final    Report Status 07/10/2012 FINAL   Final   CLOSTRIDIUM DIFFICILE BY PCR     Status: Normal   Collection Time   07/04/12  4:00 AM      Component Value Range Status Comment   C difficile by pcr NEGATIVE  NEGATIVE Final   BODY  FLUID CULTURE     Status: Normal (Preliminary result)   Collection Time   07/08/12  2:56 PM      Component Value Range Status Comment   Specimen Description PLEURAL RIGHT FLUID   Final    Special Requests NONE   Final    Gram Stain     Final    Value: WBC PRESENT,BOTH PMN AND MONONUCLEAR     NO ORGANISMS SEEN   Culture NO GROWTH 1 DAY   Final    Report Status PENDING   Incomplete      Studies: Dg Chest 1 View  07/08/2012  *RADIOLOGY REPORT*  Clinical Data: Status post thoracentesis.  CHEST - 1 VIEW  Comparison: Chest CT 07/08/2012.  Findings: The right-sided PICC line is stable.  The heart is stable.  No definite pleural effusions are identified.  No postprocedural pneumothorax.  IMPRESSION: No definite pleural effusions and no postprocedural pneumothorax.   Original Report Authenticated By: Rudie Meyer, M.D.    US Thoracentesis Asp Pleural Space W/img Guide  07/09/2012  *RADIOLOGY REPORT*  Clinical Data:  New onset bilateral pleural effusions.  History of colon cancer.  Request has been made for therapeutic and diagnostic thoracentesis.  ULTRASOUND GUIDED right THORACENTESIS  Comparison:  Prior chest x-ray and CT scan of the chest.  An ultrasound guided thoracentesis was thoroughly discussed with the patient and questions answered.  The benefits, risks, alternatives and complications were also discussed.  The patient understands and wishes to proceed with the procedure.  Written consent was obtained.  Ultrasound was performed to localize and mark an adequate pocket of fluid in the right chest.  The area was then prepped and draped in the normal sterile fashion.  1% Lidocaine was used for local anesthesia.  Under ultrasound guidance a 19 gauge Yueh catheter was introduced.  Thoracentesis was performed.  The catheter was removed and a dressing applied.  Complications:  None immediate  Findings: A total of approximately 210 ml of amber-colored fluid was removed. A fluid sample was sent for laboratory  analysis.  IMPRESSION: Successful ultrasound guided right thoracentesis yielding 210 ml of pleural fluid.  Read by: Anselm Pancoast, P.A.-C   Original Report Authenticated By: Judie Petit. Miles Costain, M.D.     Scheduled Meds:    .  antiseptic oral rinse  15 mL Mouth Rinse BID  . aspirin  325 mg Oral Daily  . cefTRIAXone (ROCEPHIN)  IV  2 g Intravenous Q24H  . cholestyramine  4 g Oral TID  . feeding supplement  30 mL Oral TID WC  . heparin subcutaneous  5,000 Units Subcutaneous Q8H  . insulin aspart  0-15 Units Subcutaneous TID WC  . insulin glargine  14 Units Subcutaneous Daily  . saccharomyces boulardii  250 mg Oral BID  . sodium chloride  3 mL Intravenous Q12H   Continuous Infusions:    . sodium chloride 20 mL/hr at 07/09/12 0222    Principal Problem:  *Gangrene of foot Active Problems:  ADENOCARCINOMA, COLON  DM  NEUROPATHY  Hyponatremia  Hypokalemia  Metabolic acidosis  AKI (acute kidney injury)  Leukocytosis  Diabetic foot ulcer  Decreased range of motion of shoulder  Septic shock(785.52)  Severe protein-calorie malnutrition   Abdon Petrosky A  [facility] Pager [phone]. If 8PM-8AM, please contact night-coverage at [email or web address], password [email or web address] 07/10/2012, 8:37 AM  LOS: 11 days

## 2012-07-10 NOTE — Progress Notes (Signed)
Pt has a denial from [facility] for CIR. Will cover SNF.  Notified pt's SW, Sarah, & CM, Corrie Dandy, & pt's sister, Sedalia Muta.  Also, notified Dr. Betti Cruz.  CIR will sign off.  [phone]

## 2012-07-11 ENCOUNTER — Inpatient Hospital Stay (HOSPITAL_COMMUNITY): Payer: Medicare HMO | Admitting: Physical Therapy

## 2012-07-11 ENCOUNTER — Inpatient Hospital Stay (HOSPITAL_COMMUNITY): Payer: Medicare HMO | Admitting: Occupational Therapy

## 2012-07-11 ENCOUNTER — Inpatient Hospital Stay (HOSPITAL_COMMUNITY): Payer: Medicare HMO | Admitting: Speech Pathology

## 2012-07-11 LAB — GLUCOSE, CAPILLARY: Glucose-Capillary: 119 mg/dL — ABNORMAL HIGH (ref 70–99)

## 2012-07-11 MED ORDER — HEPARIN SOD (PORK) LOCK FLUSH 100 UNIT/ML IV SOLN
250.0000 [IU] | INTRAVENOUS | Status: AC | PRN
  Administered 2012-07-11: 250 [IU]

## 2012-07-11 NOTE — Progress Notes (Signed)
Physical Therapy Treatment Patient Details Name: Tyler Erickson MRN: [id number] DOB: 1950/11/15 Today's Date: 07/11/2012 Time: 1200-1223 PT Time Calculation (min): 23 min  PT Assessment / Plan / Recommendation Comments on Treatment Session  Pt con't to make progress towards all goals. patient con't to benefit from inpatient rehab to maximize functional recovery to decrease burden on caregivers.    Follow Up Recommendations  CIR;SNF;Supervision/Assistance - 24 hour     Does the patient have the potential to tolerate intense rehabilitation     Barriers to Discharge        Equipment Recommendations   (TBD)    Recommendations for Other Services    Frequency Min 3X/week   Plan Discharge plan remains appropriate;Frequency needs to be updated    Precautions / Restrictions Precautions Precautions: Fall Other Brace/Splint: LLE post op hard shell shrinker post op BKA Restrictions Weight Bearing Restrictions: Yes LLE Weight Bearing: Non weight bearing   Pertinent Vitals/Pain Did not rate    Mobility  Bed Mobility Bed Mobility: Rolling Right;Right Sidelying to Sit;Sitting - Scoot to [place] of Bed Rolling Right: 4: Min assist;With rail Right Sidelying to Sit: 3: Mod assist;With rails;HOB elevated Sitting - Scoot to Edge of Bed: 3: Mod assist Details for Bed Mobility Assistance: minimal R UE functioning, increased time, assist for trunk elevation Transfers Transfers: Sit to Stand;Stand to Sit Sit to Stand: 1: +2 Total assist;From elevated surface;With upper extremity assist;From bed Sit to Stand: Patient Percentage: 50% Stand to Sit: 1: +2 Total assist;With upper extremity assist;To elevated surface;To bed Stand to Sit: Patient Percentage: 50% Details for Transfer Assistance: limited ability to push up from R UE Ambulation/Gait Ambulation/Gait Assistance: 1: +2 Total assist Ambulation/Gait: Patient Percentage: 70% Ambulation Distance (Feet):  (2 x 6 "hops", 5 min seated rest break  in bewteen) Assistive device: Right platform walker Ambulation/Gait Assistance Details: v/c's to increase UE WBing to clear R foot. pt with onset of fatigue, very sweaty Gait Pattern: Step-to pattern Stairs: No    Exercises Total Joint Exercises Long Arc Quad: AROM;Right;10 reps;Seated (with 5 sec hold)   PT Diagnosis:    PT Problem List:   PT Treatment Interventions:     PT Goals Acute Rehab PT Goals PT Goal: Supine/Side to Sit - Progress: Progressing toward goal PT Goal: Sit at Edge Of Bed - Progress: Progressing toward goal PT Goal: Sit to Stand - Progress: Progressing toward goal PT Goal: Stand to Sit - Progress: Progressing toward goal Pt will Ambulate: 1 - 15 feet;with mod assist;with rolling walker (platform.) PT Goal: Ambulate - Progress: Goal set today  Visit Information  Last PT Received On: 07/11/12 Assistance Needed: +2    Subjective Data  Subjective: Pt received supine in bed agreeable to PT.   Cognition  Overall Cognitive Status: Appears within functional limits for tasks assessed/performed Arousal/Alertness: Awake/alert Orientation Level: Appears intact for tasks assessed Behavior During Session: [facility] for tasks performed Cognition - Other Comments: requires encouragement to particiapte    Balance     End of Session PT - End of Session Equipment Utilized During Treatment: Gait belt Activity Tolerance: Patient limited by fatigue Patient left: in chair;with call bell/phone within reach Nurse Communication: Mobility status   GP     Marcene Brawn 07/11/2012, 3:41 PM  Lewis Shock, PT, DPT Pager #: [phone] Office #: [phone]

## 2012-07-11 NOTE — Progress Notes (Signed)
[facility] PROGRESS NOTE  Tyler [name]:[id number] DOB: 04-12-1951 DOA: 06/29/2012 PCP: Benita Stabile, MD  Brief narrative:  62 y.o. male with a PMH of DM, diabetic neuropathy, stage III colon cancer status post subtotal colectomy and chemotherapy (11 cycles of FOLFOX) who presented to the ER 06/29/2012 with generalized weakness and left foot pain. He had not seen a doctor in over 2 years due to financial issues. The patient stated his LLE has been discolored and tender for about a week. No fever, but reported subjective chills. He had not checked his glucoses in 2 years. Pt found to have L foot gangrene and sepsis with positive blood cx. Now S/P L BKA.   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 Pt s/p MRI found to have multiple b/l watershed infarcts. MRI cervical spine demonstrated multifactorial cervical spinal stenosis with mild spinal cord mass effect from C4-C5 to C6-C7 without spinal cord signal abnormality. In addition there was moderate to severe left C7 foraminal stenosis related to disc and endplate changes.  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 Followed by Vascular Surgery.   Group A strep bacteremia / Septic shock  Total 6 wks of antibiotics. Currently on Rocephin. ID following. PICC line on 07/07/2012.   Right hand pain/weakness etiology unclear cervical spinal stenosis with mild mass effect  Compartment pressures determine by Ortho in OR and not found to be elevated. Patient has multifocal cervical spinal stenosis with mild spinal cord mass effect, moderate to severe left C7 foraminal stenosis. On imaging though patient has bilateral cervical spinal stenosis worse on the left with patient's symptoms on the right.  Patient on exam was able to raise his hand parallel to the ground. When passively raised his hand above his head he was able to maintain his hand  elevated for extended period of time. Suspect functional component. Patient not endorsing any paresthesias or shoulder pain to suspect cervical spine or brachial plexus etiology.   Left upper extremity edema  Left upper extremity venous Dopplers negative for DVT. Given significant bilateral upper extremity edema. CT of chest, abdomen and pelvis did not show any etiology for edema.   Pleural effusions  CT chest showed moderate left and large right pleural effusions with compressive atelectasis in the lower lobes. Thoracentesis yielded exudative fluid.  Gram stain and culture shows many WBCs but no organisms.  Suspect effusion may be related to his infection and severe protein calorie malnutrition.  Cytology on the pleural fluid showed benign mesothelial cells.    Embolic infarcts  TEE showed no evidence of cardiac vegetation or emboli. Neurology recommends full dose aspirin daily for stroke prophylaxis. Neurology also recommends transcranial Doppler bubble study with emboli monitoring with Dr. Pearlean Brownie in one month to further evaluate or possible PFO as outpatient.   Diarrhea/hypomagnesemia  Probiotics started by ID. H/o colon CA last follow up CT was 2011. Continue cholestyramine. C. difficile by PCR negative on 07/04/2012. CT abdomen and pelvis done with results as indicated below.   DM  Only takes metformin at home but sugars run in 300s. Hemoglobin A1C 10.1. Further titration depending on patient's clinical course.  Currently only on sliding scale insulin.  AKI (acute kidney injury) on chronic kidney disease stage II  Resolved.  Anasarca  Due to hypoalbuminemia and sepsis. Appears to have resolved w/ diuretics and albumin.   Severe protein-calorie malnutrition  Appetite improving. Continue supplemental nutrition.   Hypokalemia  Replace as needed.  Code Status: full code  Family Communication: No family at bedside.  Disposition Plan: DC to SNF today.  DVT prophylaxis: Heparin    Consultants:  Vasc Surgery  ID  Cardiology for TEE  Neurology  Orthopedics - [facility]  LINES / TUBES:  1/03 R IJ CVL >> 1/10  1/03 ETT >> 1/5  1/03 Rt femoral Aline  1/10 R PICC Line >>  CULTURES:  1/2 blood >> GPC in chains >> Group A strep.  1/2 urine >> negative  1/6 blood >> NGTD  ANTIBIOTICS:  Clindamycin 1/2 >> 1/6  Zosyn 1/2 >> 1/6  Vancomycin 1/3 >> 1/3  Rocephin 1/6 >>   Procedures:  1/03 RUE arteriogram >> Normal arch anatomy, diffusely atherosclerotic right upper extremity arterial tree unreconstructable  1/03 - L BKA  1/8: TEE >> normal LV function, trace MR without vegetations, no atrial thrombus, no PFO  1/11: Thoracentesis >> exudative fluid  HPI/Subjective: Feeling better.  No specific concerns today.  Objective: Filed Vitals:   07/10/12 0520 07/10/12 1451 07/10/12 2331 07/11/12 0630  BP: 123/53 115/49 122/51 119/56  Pulse: 93 90 84 94  Temp: 99.7 F (37.6 C) 97.8 F (36.6 C) 98.7 F (37.1 C) 100 F (37.8 C)  TempSrc: Oral Oral Oral Oral  Resp: 18 18 20 18   Height:      Weight: 68.493 kg (151 lb)   67.7 kg (149 lb 4 oz)  SpO2: 97% 98% 99% 96%    Intake/Output Summary (Last 24 hours) at 07/11/12 0859 Last data filed at 07/10/12 1055  Gross per 24 hour  Intake      0 ml  Output    220 ml  Net   -220 ml   Filed Weights   07/09/12 1903 07/10/12 0520 07/11/12 0630  Weight: 70.1 kg (154 lb 8.7 oz) 68.493 kg (151 lb) 67.7 kg (149 lb 4 oz)    Exam: Physical Exam: General: Awake, Oriented, No acute distress. HEENT: EOMI. Neck: Supple CV: S1 and S2 Lungs: Clear to ascultation bilaterally Abdomen: Soft, Nontender, Nondistended, +bowel sounds. Ext: Good pulses. Trace edema.  Status post left BKA.  Left upper extremity swollen.  Right and left upper extremity covered in bandages on his forearms  Data Reviewed: Basic Metabolic Panel:  Lab 07/09/12 1610 07/08/12 0500 07/07/12 0510 07/06/12 0404 07/05/12 0415  NA 136 132* 134* 135  137  K 3.6 3.6 4.1 3.6 2.8*  CL 109 104 104 100 97  CO2 17* 19 23 27 30   GLUCOSE 141* 185* 190* 223* 188*  BUN 29* 26* 30* 44* 62*  CREATININE 1.20 1.19 1.14 1.38* 1.93*  CALCIUM 7.3* 6.7* 6.5* 6.7* 7.5*  MG -- -- 1.7 1.4* --  PHOS -- -- 2.2* -- --   Liver Function Tests: No results found for this basename: AST:5,ALT:5,ALKPHOS:5,BILITOT:5,PROT:5,ALBUMIN:5 in the last 168 hours No results found for this basename: LIPASE:5,AMYLASE:5 in the last 168 hours No results found for this basename: AMMONIA:5 in the last 168 hours CBC:  Lab 07/09/12 0825 07/08/12 0500 07/06/12 1100 07/05/12 0415  WBC 11.1* 11.6* 9.1 8.7  NEUTROABS -- -- -- --  HGB 8.6* 9.1* 9.0* 9.6*  HCT 26.0* 27.3* 26.7* 27.8*  MCV 86.1 84.5 84.2 81.5  PLT 276 198 87* 86*   Cardiac Enzymes: No results found for this basename: CKTOTAL:5,CKMB:5,CKMBINDEX:5,TROPONINI:5 in the last 168 hours BNP (last 3 results) No results found for this basename: PROBNP:3 in the last 8760 hours CBG:  Lab 07/10/12 2328  07/10/12 1729 07/10/12 1233 07/10/12 0704 07/09/12 2132  GLUCAP 139* 156* 247* 113* 181*    Recent Results (from the past 240 hour(s))  CULTURE, BLOOD (ROUTINE X 2)     Status: Normal   Collection Time   07/03/12  6:06 PM      Component Value Range Status Comment   Specimen Description BLOOD LEFT THUMB   Final    Special Requests BOTTLES DRAWN AEROBIC ONLY 2CC   Final    Culture  Setup Time 07/04/2012 01:53   Final    Culture NO GROWTH 5 DAYS   Final    Report Status 07/10/2012 FINAL   Final   CLOSTRIDIUM DIFFICILE BY PCR     Status: Normal   Collection Time   07/04/12  4:00 AM      Component Value Range Status Comment   C difficile by pcr NEGATIVE  NEGATIVE Final   BODY FLUID CULTURE     Status: Normal (Preliminary result)   Collection Time   07/08/12  2:56 PM      Component Value Range Status Comment   Specimen Description PLEURAL RIGHT FLUID   Final    Special Requests NONE   Final    Gram Stain     Final     Value: WBC PRESENT,BOTH PMN AND MONONUCLEAR     NO ORGANISMS SEEN   Culture NO GROWTH 2 DAYS   Final    Report Status PENDING   Incomplete      Studies: No results found.  Scheduled Meds:    . antiseptic oral rinse  15 mL Mouth Rinse BID  . aspirin  325 mg Oral Daily  . cefTRIAXone (ROCEPHIN)  IV  2 g Intravenous Q24H  . cholestyramine  4 g Oral TID  . feeding supplement  30 mL Oral TID WC  . heparin subcutaneous  5,000 Units Subcutaneous Q8H  . insulin aspart  0-15 Units Subcutaneous TID WC  . insulin glargine  14 Units Subcutaneous Daily  . saccharomyces boulardii  250 mg Oral BID  . sodium chloride  3 mL Intravenous Q12H   Continuous Infusions:    . sodium chloride 20 mL/hr (07/11/12 1610)    Principal Problem:  *Gangrene of foot Active Problems:  ADENOCARCINOMA, COLON  DM  NEUROPATHY  Hyponatremia  Hypokalemia  Metabolic acidosis  AKI (acute kidney injury)  Leukocytosis  Diabetic foot ulcer  Decreased range of motion of shoulder  Septic shock(785.52)  Severe protein-calorie malnutrition   Kalli Greenfield A  [facility] Pager [phone]. If 8PM-8AM, please contact night-coverage at [email or web address], password [facility] 07/11/2012, 8:59 AM  LOS: 12 days

## 2012-07-12 LAB — BODY FLUID CULTURE: Culture: NO GROWTH

## 2012-07-25 ENCOUNTER — Encounter: Payer: Self-pay | Admitting: Internal Medicine

## 2012-07-25 ENCOUNTER — Ambulatory Visit (INDEPENDENT_AMBULATORY_CARE_PROVIDER_SITE_OTHER): Payer: Medicare HMO | Admitting: Internal Medicine

## 2012-07-25 VITALS — BP 121/70 | HR 90 | Temp 97.5°F

## 2012-07-25 DIAGNOSIS — I96 Gangrene, not elsewhere classified: Secondary | ICD-10-CM

## 2012-07-25 NOTE — Progress Notes (Signed)
Patient ID: Tyler Erickson, male   DOB: 09-Jun-1951, 62 y.o.   MRN: [id number]    [facility] for Infectious Disease  Patient Active Problem List  Diagnosis  . ADENOCARCINOMA, COLON  . DM  . NEUROPATHY  . Hyponatremia  . Hypokalemia  . Metabolic acidosis  . AKI (acute kidney injury)  . Leukocytosis  . Diabetic foot ulcer  . Gangrene of foot  . Decreased range of motion of shoulder  . Septic shock(785.52)  . Severe protein-calorie malnutrition    Patient's Medications  New Prescriptions   No medications on file  Previous Medications   ACETAMINOPHEN (TYLENOL) 325 MG TABLET    Take 2 tablets (650 mg total) by mouth every 6 (six) hours as needed for pain or fever.   ALBUTEROL (PROVENTIL) (5 MG/ML) 0.5% NEBULIZER SOLUTION    Take 0.5 mLs (2.5 mg total) by nebulization every 6 (six) hours as needed for wheezing or shortness of breath.   ANTISEPTIC ORAL RINSE (BIOTENE) LIQD    15 mLs by Mouth Rinse route 2 (two) times daily.   ASPIRIN 325 MG TABLET    Take 1 tablet (325 mg total) by mouth daily.   CHOLESTYRAMINE (QUESTRAN) 4 G PACKET    Take 1 packet (4 g total) by mouth 3 (three) times daily. Till 07/17/2012 then discontinue.   DEXTROSE 5 % SOLN 50 ML WITH CEFTRIAXONE 2 G SOLR 2 G    Inject 2 g into the vein daily. Till 08/01/2012   FEEDING SUPPLEMENT (PRO-STAT SUGAR FREE 64) LIQD    Take 30 mLs by mouth 3 (three) times daily with meals.   INSULIN ASPART (NOVOLOG) 100 UNIT/ML INJECTION    Inject 0-15 Units into the skin 3 (three) times daily with meals. Correction coverage: Moderate (average weight, post-op) CBG < 70: implement hypoglycemia protocol CBG 70 - 120: 0 units CBG 121 - 150: 2 units CBG 151 - 200: 3 units CBG 201 - 250: 5 units CBG 251 - 300: 8 units CBG 301 - 350: 11 units CBG 351 - 400: 15 units   INSULIN GLARGINE (LANTUS) 100 UNIT/ML INJECTION    Inject 14 Units into the skin daily.   ONDANSETRON (ZOFRAN) 4 MG/2ML SOLN INJECTION    Inject 2 mLs (4 mg total) into the  vein every 6 (six) hours as needed for nausea.   OXYCODONE (OXY IR/ROXICODONE) 5 MG IMMEDIATE RELEASE TABLET    Take 1-2 tablets (5-10 mg total) by mouth every 4 (four) hours as needed for pain.   SACCHAROMYCES BOULARDII (FLORASTOR) 250 MG CAPSULE    Take 1 capsule (250 mg total) by mouth 2 (two) times daily.  Modified Medications   No medications on file  Discontinued Medications   No medications on file    Subjective: Mr. Meleski is in for his hospital followup visit. He was seen by my partner, Dr. Ninetta Lights, when he was admitted to the hospital on January 2 with severe sepsis related to a gangrenous left foot. He has peripheral vascular disease and poorly controlled diabetes. His infection was complicated by sepsis and group A streptococcal bacteremia. There was concern for endocarditis complicated by embolic strokes. He also had severe right arm weakness that was felt to be due to acute brachial neuropathy. He also had swelling of both arms and there was some concern about arterial insufficiency. I do not see it well documented but he and his sister tell me that when he left the hospital he had dry eschars on both upper  extremities. He underwent a left below the knee amputation. He is now on the skilled nursing facility and feels like he is making slow progress. He is not aware of any problems with his PICC or in tolerating his ceftriaxone. He is now completed 26 days of total antibiotic therapy.  Objective: Temp: 97.5 F (36.4 C) (01/28 1435) Temp src: Oral (01/28 1435) BP: 121/70 mmHg (01/28 1435) Pulse Rate: 90  (01/28 1435)  General: Alert and comfortable sitting in his wheelchair Skin: He has diffuse swelling of his right forearm with 2 large areas of dry eschar. There is a slight rim of erythema. There is no drainage. He has similar-appearing lesions around his left elbow. Neither area appears to be infected. His right arm PICC site appears normal Lungs: Clear Cor: Regular S1 and S2 no  murmurs Abdomen: Soft and nontender There is a clean dry gauze dressing on his left BKA stump   Assessment: He appears to be improving on therapy for group A streptococcal bacteremia, sepsis and possible endocarditis. At discharge, his antibiotic therapy was going to be extended until February 4 and I agree with the current plan.  Plan: 1. Continue ceftriaxone through February 4 2. Followup here in one month   Cliffton Asters, MD [facility] [phone] pager   [phone] cell 07/25/2012, 3:00 PM

## 2012-07-27 NOTE — Progress Notes (Signed)
[facility] PROGRESS NOTE  SEBASTHIAN STAILEY JYN:[id number] DOB: 03-07-1951 DOA: 06/29/2012 PCP: Benita Stabile, MD  Brief narrative: 62 y.o. male with a PMH of DM, diabetic neuropathy, stage III colon cancer status post subtotal colectomy and chemotherapy (11 cycles of FOLFOX) who presented to the ER 06/29/2012 with generalized weakness and left foot pain. He had not seen a doctor in over 2 years due to financial issues. The patient stated his LLE has been discolored and tender for about a week. No fever, but reported subjective chills. He had not checked his glucoses in 2 years. Pt found to have L foot gangrene and sepsis with positive blood cx.   Now S/P L BKA.  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 Pt s/p MRI found to have multiple b/l watershed infarcts. MRI cervical spine demonstrated multifactorial cervical spinal stenosis with mild spinal cord mass effect from C4-C5 to C6-C7 without spinal cord signal abnormality.  In addition there was moderate to severe left C7 foraminal stenosis related to disc and endplate changes.  LINES / TUBES:  1/03 R IJ CVL >>  1/03 ETT >> 1/5  1/03 Rt femoral Aline   CULTURES:  1/2 blood >> GPC in chains >> Group A strep sens pending.  1/2 urine >> negative  ANTIBIOTICS:  1/2 clindamycin >> 1/6 1/2 zosyn >> 1/6 1/3 vancomycin >> 1/3 Rocephin 1/6 >>  Procedures:  1/03 RUE arteriogram >> Normal arch anatomy, diffusely atherosclerotic right upper extremity arterial tree unreconstructable 1/03 - L BKA 1/8: TEE >> normal LV function, trace MR without vegetations, no atrial thrombus, no PFO   Assessment/Plan:  Gangrene of foot Stable - followed by Vascular Surgery post BKA  Group A strep bacteremia/ Septic shock  Total 6 wks of antibiotics - currently on Rocephin - ID following - plan to d/c IJ and place PICC for long term antibiotics  Right hand  pain/weakness -?? Parsonage-Turner syndrome versus cervical spinal stenosis with mild mass effect Compartment pressures determine by Ortho in OR and not found to be eleveated - arch aortogram revealed diffuse disease but no focal stenosis or thrombosis - CT and MRI brain revealed multiple embolic infarcts - ?? brachial plexus abnormality versus secondary to cervical spinal stenosis - awaiting neurology comment  Left upper extremity edema Concerning for possible DVT especially with recent IV in same arm so we'll check upper extremity venous duplex  Embolic infarcts TEE today shows no evidence of cardiac vegetation or emboli - need for ID to make comment regarding whether he should continue empiric treatment for possible septic etiology - Neurology recommends full dose aspirin daily for stroke prophylaxis-neurology also recommends transcranial Doppler bubble study with emboli monitoring with Dr. Pearlean Brownie in one month to further evaluate or possible PFO  Diarrhea/hypomagnesemia Probiotics started by ID - per pts brother in law, he had diarrhea before admission - H/o colon CA last follow up CT was 2011-will add cholestyramine-will provide magnesium bolus today and followup magnesium level-would avoid proton pump inhibitors-consider oral repletion noting that sometimes magnesium can precipitate diarrhea  DM Only takes metformin at home but sugars run in 300s -  a1c 10.1 - cont to adjust tx regimen and follow-currently adjusting Lantus insulin but unclear with patient's upper extremity neurological problems if she will be able to administer insulin-states he is utilizing insulin at home in the past-may need to consider oral agents also  AKI (acute kidney injury) Baseline cr about 1.4 - BUN  and creatinine continued to trend downward with IV fluids at 100/hr and follow lytes  Anasarca Due to hypoalbuminemia and sepsis - Appears to have resolved w/ diuretics and albumin  Severe protein-calorie  malnutrition Pt has a great appetite - just needs to be fed due to right hand pain - Added glucerna in between meals  Hypokalemia Supplement and follow - check Mg level -see above  Code Status: full code Family Communication: spoke w/ family in the room  Disposition Plan: Transfer to neuro telemetry DVT prophylaxis: heparin  Consultants:  Vasc Surgery  ID  Cardiology for TEE  Neurology  Orthopedics - [facility]  HPI/Subjective: Patient awake but affect is very blunted and he seems very depressed. Now complaining of discomfort in left arm where is swollen. States no change in pain right hand.  Objective: Filed Vitals:   07/10/12 2331 07/11/12 0630 07/11/12 0939 07/11/12 1433  BP: 122/51 119/56 123/58 111/59  Pulse: 84 94 94 81  Temp: 98.7 F (37.1 C) 100 F (37.8 C) 99.5 F (37.5 C) 97.3 F (36.3 C)  TempSrc: Oral Oral Oral Oral  Resp: 20 18 20 18   Height:      Weight:  67.7 kg (149 lb 4 oz)    SpO2: 99% 96% 98% 100%   No intake or output data in the 24 hours ending 07/27/12 1132  Exam:   General:  Alert, no distress  Cardiovascular: RRR, no murmurs  Respiratory:  CTA b/l   Abdomen: soft, NT, ND, BS+- rectal tube in place  Ext: left BKA- right leg no c/c/e  Neuro- right hand 2/5 with subjective pain and numbness- left arm and right leg 5/5, did not examine left leg  Data Reviewed: Basic Metabolic Panel: No results found for this basename: NA:5,K:5,CL:5,CO2:5,GLUCOSE:5,BUN:5,CREATININE:5,CALCIUM:5,MG:5,PHOS:5 in the last 168 hours Liver Function Tests: No results found for this basename: AST:5,ALT:5,ALKPHOS:5,BILITOT:5,PROT:5,ALBUMIN:5 in the last 168 hours CBC: No results found for this basename: WBC:5,NEUTROABS:5,HGB:5,HCT:5,MCV:5,PLT:5 in the last 168 hours Cardiac Enzymes: No results found for this basename: CKTOTAL:5,CKMB:5,CKMBINDEX:5,TROPONINI:5 in the last 168 hours CBG: No results found for this basename: GLUCAP:5 in the last 168  hours  No results found for this or any previous visit (from the past 240 hour(s)).   Studies: Dg Chest 2 View  06/29/2012  *RADIOLOGY REPORT*  Clinical Data: 62 year old male with weakness and swelling.  CHEST - 2 VIEW  Comparison: 10/28/2009 and prior chest radiographs  Findings: The cardiomediastinal silhouette is unremarkable. Mild peribronchial thickening is stable. There is no evidence of focal airspace disease, pulmonary edema, suspicious pulmonary nodule/mass, pleural effusion, or pneumothorax. No acute bony abnormalities are identified.  IMPRESSION: No evidence of active cardiopulmonary disease.   Original Report Authenticated By: Harmon Pier, M.D.    Dg Shoulder Right  06/29/2012  *RADIOLOGY REPORT*  Clinical Data: 62 year old male with right shoulder pain and swelling.  RIGHT SHOULDER - 2+ VIEW  Comparison: 10/28/2009 chest radiograph  Findings: There is no evidence of acute bony abnormality. There is no evidence of acute fracture, subluxation, or dislocation. No focal bony lesions are identified. The visualized right hemithorax is unremarkable.  Moderate degenerative changes at the [facility] joint noted. Decreased acromiohumeral space may represent a chronic rotator cuff tear.  IMPRESSION:  No evidence of acute bony abnormality.  Moderate AC joint degenerative changes and possible chronic rotator cuff tear.   Original Report Authenticated By: Harmon Pier, M.D.    Dg Forearm Right  06/29/2012  *RADIOLOGY REPORT*  Clinical Data: Right forearm pain and swelling.  RIGHT FOREARM - 2 VIEW  Comparison: None  Findings: No evidence of acute fracture, subluxation or dislocation identified.  No radio-opaque foreign bodies are present.  No focal bony lesions are noted.  The joint spaces are unremarkable.  Vascular calcifications are noted.  IMPRESSION: No evidence of acute bony abnormality.   Original Report Authenticated By: Harmon Pier, M.D.    Ct Head Wo Contrast  07/01/2012  *RADIOLOGY REPORT*  Clinical Data:  Right arm weakness.  Rule out stroke.  CT HEAD WITHOUT CONTRAST  Technique:  Contiguous axial images were obtained from the base of the skull through the vertex without contrast.  Comparison: None.  Findings: Mild atrophy.  Negative for acute infarct.  Negative for hemorrhage or mass lesion.  No fluid collection is present. Calvarium is intact.  IMPRESSION: Mild atrophy.  No acute abnormality.   Original Report Authenticated By: Janeece Riggers, M.D.    Mr [facility] Wo Contrast  07/04/2012  *RADIOLOGY REPORT*  Clinical Data:  62 year old male with slurred speech, difficulty moving right shoulder.  Recent left lower extremity amputation.  Comparison:  Head CT 07/01/2012.  MRI HEAD WITHOUT CONTRAST MRI CERVICAL SPINE WITHOUT CONTRAST  Technique:  Multiplanar, multiecho pulse sequences of the brain and surrounding structures, and cervical spine, to include the craniocervical junction and cervicothoracic junction, were obtained without intravenous contrast.  MRI HEAD  Findings:  Punctate cortical focus of restricted diffusion in the left parietal lobe, posterior to the sensory strip (series 5 image 23).  No associated T2 or FLAIR hyperintensity.  Possible additional punctate area of restriction in the left occipital lobe white matter (image 13).  There is a small focus of restricted diffusion along the anterior right cerebellar tonsil (series 5 image 5).  There are to other punctate areas of restricted diffusion in the right cerebellum, one involving the right middle cerebellar peduncle (series 5 image 8). Probably also one additional punctate area of restriction in the dorsal medulla also on this image.  Minimal associated T2 and FLAIR hyperintensity.  Diffusion elsewhere is within normal limits. No acute intracranial hemorrhage identified.  No midline shift, mass effect, or evidence of mass lesion.  No ventriculomegaly. Major intracranial vascular flow voids are preserved, MRA findings are below.  Outside of the above  findings, there is only mild for age scattered cerebral white matter T2 and FLAIR hyperintensity, mostly subcortical.  Negative pituitary cervicomedullary junction. Cervical spine findings are below.  Visualized orbit soft tissues are within normal limits.  Trace mastoid effusions.  Negative visualized nasopharynx. Mild maxillary and ethmoid sinus mucosal thickening.  Negative scalp soft tissues.  IMPRESSION: 1. 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  Two punctate acute infarcts suspected also in the left occipital and parietal lobe. No mass effect or hemorrhage. Given the distribution, embolic phenomena to the posterior circulation is possible.  The supratentorial findings might also reflect synchronous small vessel disease. 2.  No other acute intracranial abnormality and otherwise largely unremarkable for age MRI appearance of the brain.  3.  Cervical spine and MRA findings are below.  MRI CERVICAL SPINE  Findings: Preserved cervical lordosis. No marrow edema or evidence of acute osseous abnormality.  Subcutaneous edema in the posterior paraspinal soft tissues, possible mild involvement of the superficial aspect of the underlying erector spinae muscles.  This may be related to prolonged bedrest, the conditioning. Other Visualized paraspinal soft tissues are within normal limits.  Cervicomedullary junction is within normal limits.  Despite spinal stenosis, described below,  no cervical spinal cord signal abnormality.  C2-C3:  Negative.  C3-C4:  Mild circumferential disc bulge.  Mild uncovertebral hypertrophy.  Mild bilateral C4 foraminal stenosis.  C4-C5:  Circumferential disc osteophyte complex.  Broad-based central right paracentral posterior component of disc.  Spinal stenosis with mild spinal cord flattening.  Mild left C5 foraminal stenosis.  C5-C6:  Circumferential disc osteophyte complex.  Moderate ligament flavum hypertrophy.  Broad-based posterior component of disc. Spinal  stenosis with mild spinal cord flattening.  Mild to moderate bilateral C6 foraminal stenosis.  C6-C7:  Circumferential disc osteophyte complex.  Broad-based left eccentric posterior component of disc.  Moderate ligament flavum hypertrophy.  Spinal stenosis with minimal spinal cord mass effect. Moderate to severe left and mild right C7 foraminal stenosis.  C7-T1:  Mild facet hypertrophy.  Mild left greater than right C8 foraminal stenosis.  Negative visualized upper thoracic levels.  IMPRESSION: 1.  Multifactorial cervical spinal stenosis with mild spinal cord mass effect from C4-C5 to C6-C7.  No spinal cord signal abnormality. 2.  Moderate to severe left C7 foraminal stenosis related to disc and endplate changes. Mild to moderate multifactorial bilateral C6 foraminal stenosis. 3.  MRA findings are below.  MRA HEAD WITHOUT CONTRAST  Technique:  Angiographic images of the Circle of Willis were obtained using MRA technique without intravenous contrast.  Findings:  Antegrade flow in the posterior circulation.  Codominant distal vertebral arteries.  Normal right PICA.  Normal vertebrobasilar junction.  Prominent right AICA also noted.  No left PICA or AICA origin identified. Subsequently, the left SCA might be dominant.  No basilar stenosis.  SCA and PCA origins are normal.  Diminutive posterior communicating arteries.  Bilateral PCA branches are within normal limits.  Antegrade flow in both ICA siphons.  Mild supraclinoid ICA irregularity.  No focal ICA stenosis.  Patent carotid termini.  MCA and ACA origins are within normal limits.  Diminutive or absent anterior communicating artery.  Visualized ACA branches are within normal limits.  Visualized bilateral MCA branches are within normal limits.  IMPRESSION: Negative intracranial MRA.   Original Report Authenticated By: Erskine Speed, M.D.    Mr Brain Wo Contrast  07/04/2012  *RADIOLOGY REPORT*  Clinical Data:  62 year old male with slurred speech, difficulty moving  right shoulder.  Recent left lower extremity amputation.  Comparison:  Head CT 07/01/2012.  MRI HEAD WITHOUT CONTRAST MRI CERVICAL SPINE WITHOUT CONTRAST  Technique:  Multiplanar, multiecho pulse sequences of the brain and surrounding structures, and cervical spine, to include the craniocervical junction and cervicothoracic junction, were obtained without intravenous contrast.  MRI HEAD  Findings:  Punctate cortical focus of restricted diffusion in the left parietal lobe, posterior to the sensory strip (series 5 image 23).  No associated T2 or FLAIR hyperintensity.  Possible additional punctate area of restriction in the left occipital lobe white matter (image 13).  There is a small focus of restricted diffusion along the anterior right cerebellar tonsil (series 5 image 5).  There are to other punctate areas of restricted diffusion in the right cerebellum, one involving the right middle cerebellar peduncle (series 5 image 8). Probably also one additional punctate area of restriction in the dorsal medulla also on this image.  Minimal associated T2 and FLAIR hyperintensity.  Diffusion elsewhere is within normal limits. No acute intracranial hemorrhage identified.  No midline shift, mass effect, or evidence of mass lesion.  No ventriculomegaly. Major intracranial vascular flow voids are preserved, MRA findings are below.  Outside of the above findings, there  is only mild for age scattered cerebral white matter T2 and FLAIR hyperintensity, mostly subcortical.  Negative pituitary cervicomedullary junction. Cervical spine findings are below.  Visualized orbit soft tissues are within normal limits.  Trace mastoid effusions.  Negative visualized nasopharynx. Mild maxillary and ethmoid sinus mucosal thickening.  Negative scalp soft tissues.  IMPRESSION: 1. 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  Two punctate acute infarcts suspected also in the left occipital and parietal lobe. No mass effect or  hemorrhage. Given the distribution, embolic phenomena to the posterior circulation is possible.  The supratentorial findings might also reflect synchronous small vessel disease. 2.  No other acute intracranial abnormality and otherwise largely unremarkable for age MRI appearance of the brain.  3.  Cervical spine and MRA findings are below.  MRI CERVICAL SPINE  Findings: Preserved cervical lordosis. No marrow edema or evidence of acute osseous abnormality.  Subcutaneous edema in the posterior paraspinal soft tissues, possible mild involvement of the superficial aspect of the underlying erector spinae muscles.  This may be related to prolonged bedrest, the conditioning. Other Visualized paraspinal soft tissues are within normal limits.  Cervicomedullary junction is within normal limits.  Despite spinal stenosis, described below, no cervical spinal cord signal abnormality.  C2-C3:  Negative.  C3-C4:  Mild circumferential disc bulge.  Mild uncovertebral hypertrophy.  Mild bilateral C4 foraminal stenosis.  C4-C5:  Circumferential disc osteophyte complex.  Broad-based central right paracentral posterior component of disc.  Spinal stenosis with mild spinal cord flattening.  Mild left C5 foraminal stenosis.  C5-C6:  Circumferential disc osteophyte complex.  Moderate ligament flavum hypertrophy.  Broad-based posterior component of disc. Spinal stenosis with mild spinal cord flattening.  Mild to moderate bilateral C6 foraminal stenosis.  C6-C7:  Circumferential disc osteophyte complex.  Broad-based left eccentric posterior component of disc.  Moderate ligament flavum hypertrophy.  Spinal stenosis with minimal spinal cord mass effect. Moderate to severe left and mild right C7 foraminal stenosis.  C7-T1:  Mild facet hypertrophy.  Mild left greater than right C8 foraminal stenosis.  Negative visualized upper thoracic levels.  IMPRESSION: 1.  Multifactorial cervical spinal stenosis with mild spinal cord mass effect from C4-C5 to  C6-C7.  No spinal cord signal abnormality. 2.  Moderate to severe left C7 foraminal stenosis related to disc and endplate changes. Mild to moderate multifactorial bilateral C6 foraminal stenosis. 3.  MRA findings are below.  MRA HEAD WITHOUT CONTRAST  Technique:  Angiographic images of the Circle of Willis were obtained using MRA technique without intravenous contrast.  Findings:  Antegrade flow in the posterior circulation.  Codominant distal vertebral arteries.  Normal right PICA.  Normal vertebrobasilar junction.  Prominent right AICA also noted.  No left PICA or AICA origin identified. Subsequently, the left SCA might be dominant.  No basilar stenosis.  SCA and PCA origins are normal.  Diminutive posterior communicating arteries.  Bilateral PCA branches are within normal limits.  Antegrade flow in both ICA siphons.  Mild supraclinoid ICA irregularity.  No focal ICA stenosis.  Patent carotid termini.  MCA and ACA origins are within normal limits.  Diminutive or absent anterior communicating artery.  Visualized ACA branches are within normal limits.  Visualized bilateral MCA branches are within normal limits.  IMPRESSION: Negative intracranial MRA.   Original Report Authenticated By: Erskine Speed, M.D.    Mr Cervical Spine Wo Contrast  07/04/2012  *RADIOLOGY REPORT*  Clinical Data:  62 year old male with slurred speech, difficulty moving right shoulder.  Recent left lower extremity amputation.  Comparison:  Head CT 07/01/2012.  MRI HEAD WITHOUT CONTRAST MRI CERVICAL SPINE WITHOUT CONTRAST  Technique:  Multiplanar, multiecho pulse sequences of the brain and surrounding structures, and cervical spine, to include the craniocervical junction and cervicothoracic junction, were obtained without intravenous contrast.  MRI HEAD  Findings:  Punctate cortical focus of restricted diffusion in the left parietal lobe, posterior to the sensory strip (series 5 image 23).  No associated T2 or FLAIR hyperintensity.  Possible  additional punctate area of restriction in the left occipital lobe white matter (image 13).  There is a small focus of restricted diffusion along the anterior right cerebellar tonsil (series 5 image 5).  There are to other punctate areas of restricted diffusion in the right cerebellum, one involving the right middle cerebellar peduncle (series 5 image 8). Probably also one additional punctate area of restriction in the dorsal medulla also on this image.  Minimal associated T2 and FLAIR hyperintensity.  Diffusion elsewhere is within normal limits. No acute intracranial hemorrhage identified.  No midline shift, mass effect, or evidence of mass lesion.  No ventriculomegaly. Major intracranial vascular flow voids are preserved, MRA findings are below.  Outside of the above findings, there is only mild for age scattered cerebral white matter T2 and FLAIR hyperintensity, mostly subcortical.  Negative pituitary cervicomedullary junction. Cervical spine findings are below.  Visualized orbit soft tissues are within normal limits.  Trace mastoid effusions.  Negative visualized nasopharynx. Mild maxillary and ethmoid sinus mucosal thickening.  Negative scalp soft tissues.  IMPRESSION: 1. 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  Two punctate acute infarcts suspected also in the left occipital and parietal lobe. No mass effect or hemorrhage. Given the distribution, embolic phenomena to the posterior circulation is possible.  The supratentorial findings might also reflect synchronous small vessel disease. 2.  No other acute intracranial abnormality and otherwise largely unremarkable for age MRI appearance of the brain.  3.  Cervical spine and MRA findings are below.  MRI CERVICAL SPINE  Findings: Preserved cervical lordosis. No marrow edema or evidence of acute osseous abnormality.  Subcutaneous edema in the posterior paraspinal soft tissues, possible mild involvement of the superficial aspect of the  underlying erector spinae muscles.  This may be related to prolonged bedrest, the conditioning. Other Visualized paraspinal soft tissues are within normal limits.  Cervicomedullary junction is within normal limits.  Despite spinal stenosis, described below, no cervical spinal cord signal abnormality.  C2-C3:  Negative.  C3-C4:  Mild circumferential disc bulge.  Mild uncovertebral hypertrophy.  Mild bilateral C4 foraminal stenosis.  C4-C5:  Circumferential disc osteophyte complex.  Broad-based central right paracentral posterior component of disc.  Spinal stenosis with mild spinal cord flattening.  Mild left C5 foraminal stenosis.  C5-C6:  Circumferential disc osteophyte complex.  Moderate ligament flavum hypertrophy.  Broad-based posterior component of disc. Spinal stenosis with mild spinal cord flattening.  Mild to moderate bilateral C6 foraminal stenosis.  C6-C7:  Circumferential disc osteophyte complex.  Broad-based left eccentric posterior component of disc.  Moderate ligament flavum hypertrophy.  Spinal stenosis with minimal spinal cord mass effect. Moderate to severe left and mild right C7 foraminal stenosis.  C7-T1:  Mild facet hypertrophy.  Mild left greater than right C8 foraminal stenosis.  Negative visualized upper thoracic levels.  IMPRESSION: 1.  Multifactorial cervical spinal stenosis with mild spinal cord mass effect from C4-C5 to C6-C7.  No spinal cord signal abnormality. 2.  Moderate to severe  left C7 foraminal stenosis related to disc and endplate changes. Mild to moderate multifactorial bilateral C6 foraminal stenosis. 3.  MRA findings are below.  MRA HEAD WITHOUT CONTRAST  Technique:  Angiographic images of the Circle of Willis were obtained using MRA technique without intravenous contrast.  Findings:  Antegrade flow in the posterior circulation.  Codominant distal vertebral arteries.  Normal right PICA.  Normal vertebrobasilar junction.  Prominent right AICA also noted.  No left PICA or AICA  origin identified. Subsequently, the left SCA might be dominant.  No basilar stenosis.  SCA and PCA origins are normal.  Diminutive posterior communicating arteries.  Bilateral PCA branches are within normal limits.  Antegrade flow in both ICA siphons.  Mild supraclinoid ICA irregularity.  No focal ICA stenosis.  Patent carotid termini.  MCA and ACA origins are within normal limits.  Diminutive or absent anterior communicating artery.  Visualized ACA branches are within normal limits.  Visualized bilateral MCA branches are within normal limits.  IMPRESSION: Negative intracranial MRA.   Original Report Authenticated By: Erskine Speed, M.D.    Dg Chest Port 1 View  07/02/2012  *RADIOLOGY REPORT*  Clinical Data: Advancement of endotracheal tube  PORTABLE CHEST - 1 VIEW  Comparison: Portable exam 0626 hours compared to 07/02/2012 at 0514 hours  Findings: Tip of endotracheal tube now 2.0 cm above carina. Nasogastric tube and right jugular line unchanged. Stable heart size. Persistent atelectasis versus consolidation left lower lobe. Mild right basilar atelectasis. Upper lungs clear.  IMPRESSION: Tip of endotracheal tube now 2.0 cm above carina. Remainder exam unchanged.   Original Report Authenticated By: Ulyses Southward, M.D.    Dg Chest Port 1 View  07/02/2012  *RADIOLOGY REPORT*  Clinical Data: Follow up atelectasis  PORTABLE CHEST - 1 VIEW  Comparison: Portable exam 0514 hours compared to 07/01/2012  Findings: Tip of endotracheal tube in cervical region, 11.8 cm above carina. Right jugular central venous catheter tip projecting over SVC. Nasogastric tube extends into stomach. Numerous cardiac monitoring leads project over chest. Upper-normal size of cardiac silhouette. Mediastinal contours and pulmonary vascularity normal. Minimal right basilar atelectasis. Slightly increased atelectasis versus consolidation in medial left lower lobe. No definite pleural effusion or pneumothorax.  IMPRESSION: Minimal right basilar  atelectasis with increased atelectasis versus consolidation in left lower lobe. High position of endotracheal tube 11.8 cm above carina; may consider advancing tube 6 cm for more stable position within thoracic trachea.   Original Report Authenticated By: Ulyses Southward, M.D.    Dg Chest Port 1 View  07/01/2012  *RADIOLOGY REPORT*  Clinical Data: Acute respiratory failure on ventilator.  Colon carcinoma.  PORTABLE CHEST - 1 VIEW  Comparison: 06/30/2012  Findings: Endotracheal tube remains high in position with the tip approximately 11 cm above the carina.  Both lungs are clear.  Heart size is normal.  Right jugular center venous catheter remains in appropriate position.  IMPRESSION:  1.  High endotracheal tube position, with tip approximately 11 cm above carina. 2.  No active lung disease.   Original Report Authenticated By: Myles Rosenthal, M.D.    Dg Chest Port 1 View  06/30/2012  *RADIOLOGY REPORT*  Clinical Data: Endotracheal placement  PORTABLE CHEST - 1 VIEW  Comparison: Same day  Findings: Endotracheal tip is 9 cm above the carina, at the thoracic inlet.  Right internal jugular central line is in the SVC at the azygos level.  Minimal atelectasis again noted in the left lower lobe.  No other change.  IMPRESSION: Endotracheal  tube somewhat high, 9 cm above the carina at the thoracic inlet.   Original Report Authenticated By: Paulina Fusi, M.D.    Dg Chest Portable 1 View  06/30/2012  *RADIOLOGY REPORT*  Clinical Data: Central line placement.  PORTABLE CHEST - 1 VIEW  Comparison: Chest radiograph performed 06/29/2012  Findings: The lungs are well-aerated.  Mild right basilar opacity may reflect atelectasis or possibly pneumonia.  There is no evidence of pleural effusion or pneumothorax.  The cardiomediastinal silhouette is within normal limits.  No acute osseous abnormalities are seen.  A right IJ line is noted ending about the mid SVC.  IMPRESSION:  1.  Right IJ line noted ending about the mid SVC. 2.  Mild right  basilar airspace opacity may reflect atelectasis or possibly pneumonia.   Original Report Authenticated By: Tonia Ghent, M.D.    Dg Foot Complete Left  06/29/2012  *RADIOLOGY REPORT*  Clinical Data: 62 year old male with left foot pain and wounds on left foot.  LEFT FOOT - COMPLETE 3+ VIEW  Comparison: None  Findings: Soft tissue gas and swelling is identified at the level of the lateral MTP joints. There is no evidence of acute fracture, subluxation or dislocation. The Lisfranc joints are intact. No focal bony lesions are present except for a large plantar calcaneal spur. Vascular calcifications are present.  IMPRESSION: Soft tissue gas and swelling overlying the distal foot suspicious either for open wound or gas forming infection.  No radiographic evidence of osteomyelitis.  No acute bony abnormalities identified.  Calcaneal spur and vascular calcifications.   Original Report Authenticated By: Harmon Pier, M.D.     Scheduled Meds:    Continuous Infusions:    _______________________________________________________________________   [facility]  [facility] Pager [phone] If 8PM-8AM, please contact night-coverage at [email or web address], password [facility] 07/27/2012, 11:32 AM  LOS: 12 days    I have examined the patient, reviewed the chart and modified the above note which I agree with.   Arthur Speagle,MD [phone] 07/27/2012, 11:33 AM

## 2012-07-28 ENCOUNTER — Inpatient Hospital Stay (HOSPITAL_COMMUNITY)
Admission: EM | Admit: 2012-07-28 | Discharge: 2012-07-30 | DRG: 811 | Disposition: A | Discharge status: Skilled Nursing Facility | Payer: Medicare HMO | Attending: Internal Medicine | Admitting: Internal Medicine

## 2012-07-28 ENCOUNTER — Encounter (HOSPITAL_COMMUNITY): Payer: Self-pay | Admitting: Emergency Medicine

## 2012-07-28 ENCOUNTER — Emergency Department (HOSPITAL_COMMUNITY): Payer: Medicare HMO

## 2012-07-28 DIAGNOSIS — E43 Unspecified severe protein-calorie malnutrition: Secondary | ICD-10-CM | POA: Diagnosis present

## 2012-07-28 DIAGNOSIS — S88119A Complete traumatic amputation at level between knee and ankle, unspecified lower leg, initial encounter: Secondary | ICD-10-CM

## 2012-07-28 DIAGNOSIS — E1149 Type 2 diabetes mellitus with other diabetic neurological complication: Secondary | ICD-10-CM | POA: Diagnosis present

## 2012-07-28 DIAGNOSIS — B955 Unspecified streptococcus as the cause of diseases classified elsewhere: Secondary | ICD-10-CM | POA: Diagnosis present

## 2012-07-28 DIAGNOSIS — N183 Chronic kidney disease, stage 3 unspecified: Secondary | ICD-10-CM | POA: Diagnosis present

## 2012-07-28 DIAGNOSIS — I6930 Unspecified sequelae of cerebral infarction: Secondary | ICD-10-CM | POA: Diagnosis present

## 2012-07-28 DIAGNOSIS — G540 Brachial plexus disorders: Secondary | ICD-10-CM | POA: Diagnosis present

## 2012-07-28 DIAGNOSIS — Z9221 Personal history of antineoplastic chemotherapy: Secondary | ICD-10-CM

## 2012-07-28 DIAGNOSIS — IMO0002 Reserved for concepts with insufficient information to code with codable children: Secondary | ICD-10-CM

## 2012-07-28 DIAGNOSIS — E119 Type 2 diabetes mellitus without complications: Secondary | ICD-10-CM

## 2012-07-28 DIAGNOSIS — C189 Malignant neoplasm of colon, unspecified: Secondary | ICD-10-CM | POA: Diagnosis present

## 2012-07-28 DIAGNOSIS — E1142 Type 2 diabetes mellitus with diabetic polyneuropathy: Secondary | ICD-10-CM | POA: Diagnosis present

## 2012-07-28 DIAGNOSIS — Z89519 Acquired absence of unspecified leg below knee: Secondary | ICD-10-CM

## 2012-07-28 DIAGNOSIS — Z8673 Personal history of transient ischemic attack (TIA), and cerebral infarction without residual deficits: Secondary | ICD-10-CM

## 2012-07-28 DIAGNOSIS — R5383 Other fatigue: Secondary | ICD-10-CM

## 2012-07-28 DIAGNOSIS — B950 Streptococcus, group A, as the cause of diseases classified elsewhere: Secondary | ICD-10-CM | POA: Diagnosis present

## 2012-07-28 DIAGNOSIS — Z794 Long term (current) use of insulin: Secondary | ICD-10-CM

## 2012-07-28 DIAGNOSIS — E1159 Type 2 diabetes mellitus with other circulatory complications: Secondary | ICD-10-CM | POA: Diagnosis present

## 2012-07-28 DIAGNOSIS — R7881 Bacteremia: Secondary | ICD-10-CM | POA: Diagnosis present

## 2012-07-28 DIAGNOSIS — Z79899 Other long term (current) drug therapy: Secondary | ICD-10-CM

## 2012-07-28 DIAGNOSIS — Z85038 Personal history of other malignant neoplasm of large intestine: Secondary | ICD-10-CM

## 2012-07-28 DIAGNOSIS — F17200 Nicotine dependence, unspecified, uncomplicated: Secondary | ICD-10-CM | POA: Diagnosis present

## 2012-07-28 DIAGNOSIS — I639 Cerebral infarction, unspecified: Secondary | ICD-10-CM

## 2012-07-28 DIAGNOSIS — R531 Weakness: Secondary | ICD-10-CM | POA: Diagnosis present

## 2012-07-28 DIAGNOSIS — Z9049 Acquired absence of other specified parts of digestive tract: Secondary | ICD-10-CM

## 2012-07-28 DIAGNOSIS — D649 Anemia, unspecified: Principal | ICD-10-CM | POA: Diagnosis present

## 2012-07-28 HISTORY — DX: Acquired absence of unspecified leg below knee: Z89.519

## 2012-07-28 HISTORY — DX: Cerebral infarction, unspecified: I63.9

## 2012-07-28 LAB — COMPREHENSIVE METABOLIC PANEL
ALT: 30 U/L (ref 0–53)
AST: 27 U/L (ref 0–37)
Albumin: 1.7 g/dL — ABNORMAL LOW (ref 3.5–5.2)
CO2: 27 mEq/L (ref 19–32)
Calcium: 7.8 mg/dL — ABNORMAL LOW (ref 8.4–10.5)
Creatinine, Ser: 1.22 mg/dL (ref 0.50–1.35)
GFR calc non Af Amer: 63 mL/min — ABNORMAL LOW (ref >90)
Sodium: 134 mEq/L — ABNORMAL LOW (ref 135–145)

## 2012-07-28 LAB — CBC WITH DIFFERENTIAL/PLATELET
Basophils Absolute: 0 10*3/uL (ref 0.0–0.1)
Basophils Relative: 0 % (ref 0–1)
Eosinophils Relative: 1 % (ref 0–5)
Lymphocytes Relative: 14 % (ref 12–46)
MCHC: 31.8 g/dL (ref 30.0–36.0)
MCV: 84.4 fL (ref 78.0–100.0)
Neutro Abs: 7.7 10*3/uL (ref 1.7–7.7)
Platelets: 345 10*3/uL (ref 150–400)
RDW: 14 % (ref 11.5–15.5)
WBC: 9.8 10*3/uL (ref 4.0–10.5)

## 2012-07-28 LAB — SAMPLE TO BLOOD BANK

## 2012-07-28 LAB — OCCULT BLOOD X 1 CARD TO LAB, STOOL: Fecal Occult Bld: NEGATIVE

## 2012-07-28 MED ORDER — SODIUM CHLORIDE 0.9 % IJ SOLN
3.0000 mL | Freq: Two times a day (BID) | INTRAMUSCULAR | Status: DC
  Administered 2012-07-28 – 2012-07-29 (×2): 3 mL via INTRAVENOUS

## 2012-07-28 MED ORDER — INSULIN ASPART 100 UNIT/ML ~~LOC~~ SOLN: 0.0000 [IU] | Freq: Three times a day (TID) | SUBCUTANEOUS | Status: DC

## 2012-07-28 MED ORDER — ALBUTEROL SULFATE (5 MG/ML) 0.5% IN NEBU: 2.5000 mg | INHALATION_SOLUTION | Freq: Four times a day (QID) | RESPIRATORY_TRACT | Status: DC | PRN

## 2012-07-28 MED ORDER — OXYCODONE HCL 5 MG PO TABS
5.0000 mg | ORAL_TABLET | ORAL | Status: DC | PRN
  Administered 2012-07-29 – 2012-07-30 (×3): 10 mg via ORAL
  Filled 2012-07-28 (×3): qty 2

## 2012-07-28 MED ORDER — INSULIN ASPART 100 UNIT/ML ~~LOC~~ SOLN: 5.0000 [IU] | Freq: Three times a day (TID) | SUBCUTANEOUS | Status: DC

## 2012-07-28 MED ORDER — INSULIN GLARGINE 100 UNIT/ML ~~LOC~~ SOLN
18.0000 [IU] | Freq: Every day | SUBCUTANEOUS | Status: DC
  Administered 2012-07-28: 18 [IU] via SUBCUTANEOUS

## 2012-07-28 MED ORDER — INSULIN ASPART 100 UNIT/ML ~~LOC~~ SOLN: 5.0000 [IU] | Freq: Every day | SUBCUTANEOUS | Status: DC

## 2012-07-28 MED ORDER — SODIUM CHLORIDE 0.9 % IV SOLN: 250.0000 mL | INTRAVENOUS | Status: DC | PRN

## 2012-07-28 MED ORDER — SODIUM CHLORIDE 0.9 % IJ SOLN: 3.0000 mL | INTRAMUSCULAR | Status: DC | PRN

## 2012-07-28 MED ORDER — DEXTROSE 5 % IV SOLN
2.0000 g | INTRAVENOUS | Status: DC
  Administered 2012-07-28 – 2012-07-29 (×2): 2 g via INTRAVENOUS
  Filled 2012-07-28 (×3): qty 2

## 2012-07-28 NOTE — ED Notes (Signed)
Per EMS-pt from [facility] [place] c/o of abnormal HGB 6.7. Hx diabetes, BKA.  AAO, NAD at this time.

## 2012-07-28 NOTE — ED Notes (Signed)
[id number]<AV> Expected date:<BR> Expected time:<BR> Means of arrival:<BR> Comments:<BR> 85 yom abnormal labs Hgb 6.7

## 2012-07-28 NOTE — ED Provider Notes (Signed)
History     CSN: [id number]  Arrival date & time 07/28/12  1509   First MD Initiated Contact with Patient 07/28/12 1555      Chief Complaint  Patient presents with  . Abnormal Lab    (Consider location/radiation/quality/duration/timing/severity/associated sxs/prior treatment) The history is provided by the patient.   patient was sent in from rehabilitation with anemia. Hemoglobin of 6.7. He had sepsis earlier in the month and ended up having a below-the-knee amputation of his left leg. He been septic and also had a possible stroke. He is on antibiotics through a PICC line. He said previous hemicolectomy. Denies blood in the stool. No fevers. No bleeding. He has pressure wounds to bilateral arms and states he has some history her and also. No fevers. He states he feels fatigued  Past Medical History  Diagnosis Date  . ADENOCARCINOMA, COLON     S/P surgery/chemotherapy  . DM   . NEUROPATHY   . Kidney stones   . Large bowel obstruction   . Ischemic colon     Past Surgical History  Procedure Date  . 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 Ileostomy closure 10/31/2009  . Porta-cath insertion 08/08/2008  . Amputation 06/30/2012    Procedure: AMPUTATION BELOW KNEE;  Surgeon: Sherren Kerns, MD;  Location: [facility] OR;  Service: Vascular;  Laterality: Left;  . Intraoperative arteriogram 06/30/2012    Procedure: INTRA OPERATIVE ARTERIOGRAM;  Surgeon: Sherren Kerns, MD;  Location: [facility] OR;  Service: Vascular;  Laterality: Right;  . Aortogram 06/30/2012    Procedure: AORTOGRAM;  Surgeon: Sherren Kerns, MD;  Location: [facility] OR;  Service: Vascular;  Laterality: Right;  Right upper extremity arch   . Tee without cardioversion 07/05/2012    Procedure: TRANSESOPHAGEAL ECHOCARDIOGRAM (TEE);  Surgeon: Vesta Mixer, MD;  Location: [facility] ENDOSCOPY;  Service: Cardiovascular;  Laterality: N/A;    Family History  Problem Relation Age of Onset  . Diabetes Mother   . Diabetes Father   . Heart  failure Mother   . Heart failure Father   . Stroke Father   . Diabetes Sister   . Diabetes Sister   . Diabetes Brother     History  Substance Use Topics  . Smoking status: Current Every Day Smoker    Types: Cigars  . Smokeless tobacco: Never Used     Comment: last cigar a month ago  . Alcohol Use: No      Review of Systems  Constitutional: Positive for fatigue. Negative for fever, activity change and appetite change.  HENT: Negative for neck stiffness.   Eyes: Negative for pain.  Respiratory: Negative for chest tightness and shortness of breath.   Cardiovascular: Negative for chest pain and leg swelling.  Gastrointestinal: Negative for nausea, vomiting, abdominal pain and diarrhea.  Genitourinary: Negative for flank pain.  Musculoskeletal: Negative for back pain.  Skin: Negative for rash.  Neurological: Positive for weakness. Negative for numbness and headaches.  Psychiatric/Behavioral: Negative for behavioral problems.    Allergies  Review of patient's allergies indicates no known allergies.  Home Medications   Current Outpatient Rx  Name  Route  Sig  Dispense  Refill  . ACETAMINOPHEN 325 MG PO TABS   Oral   Take 2 tablets (650 mg total) by mouth every 6 (six) hours as needed for pain or fever.         . ALBUTEROL SULFATE (5 MG/ML) 0.5% IN NEBU   Nebulization   Take 0.5 mLs (2.5 mg total)  by nebulization every 6 (six) hours as needed for wheezing or shortness of breath.   20 mL      . ASPIRIN 325 MG PO TABS   Oral   Take 1 tablet (325 mg total) by mouth daily.         . CHLORHEXIDINE GLUCONATE 0.12 % MT SOLN   Mouth/Throat   Use as directed 15 mLs in the mouth or throat 2 (two) times daily.         Marland Kitchen CEFTRIAXONE 2 G/50 ML IVPB MIXTURE   Intravenous   Inject 2 g into the vein daily. Till 08/01/2012         . INSULIN ASPART 100 UNIT/ML [place] SOLN   Subcutaneous   Inject 0-15 Units into the skin 3 (three) times daily with meals. If CBG above 150 give 5  units 15 minutes before or 30 minutes after a meal, subcutaneously and at bedtime.         . INSULIN GLARGINE 100 UNIT/ML [place] SOLN   Subcutaneous   Inject 18 Units into the skin at bedtime.         Marland Kitchen DECUBI-VITE PO CAPS   Oral   Take 1 capsule by mouth 2 (two) times daily.         Marland Kitchen ONDANSETRON HCL 4 MG/2ML IJ SOLN   Intravenous   Inject 2 mLs (4 mg total) into the vein every 6 (six) hours as needed for nausea.   2 mL      . OVER THE COUNTER MEDICATION   Oral   Take 90 mLs by mouth 3 (three) times daily. 2  Cal supplement.         . OXYCODONE HCL 5 MG PO TABS   Oral   Take 1-2 tablets (5-10 mg total) by mouth every 4 (four) hours as needed for pain.   10 tablet   0   . CHOLESTYRAMINE 4 G PO PACK   Oral   Take 1 packet (4 g total) by mouth 3 (three) times daily. Till 07/17/2012 then discontinue.   60 each      . SACCHAROMYCES BOULARDII 250 MG PO CAPS   Oral   Take 1 capsule (250 mg total) by mouth 2 (two) times daily.           BP 97/82  Pulse 89  Temp 97.8 F (36.6 C) (Oral)  Resp 24  SpO2 100%  Physical Exam  Nursing note and vitals reviewed. Constitutional: He is oriented to person, place, and time. He appears well-developed and well-nourished.  HENT:  Head: Normocephalic and atraumatic.  Eyes: EOM are normal. Pupils are equal, round, and reactive to light.  Neck: Normal range of motion. Neck supple.  Cardiovascular: Normal rate, regular rhythm and normal heart sounds.   No murmur heard. Pulmonary/Chest: Effort normal and breath sounds normal.  Abdominal: Soft. Bowel sounds are normal. He exhibits no distension and no mass. There is no tenderness. There is no rebound and no guarding.  Musculoskeletal: Normal range of motion. He exhibits edema.       Left below-the-knee amputation. PICC line in right upper extremity. Edema to right upper extremity. Pressure wounds to left and right upper extremities. No bleeding.  Neurological: He is alert and  oriented to person, place, and time. No cranial nerve deficit.       Weakness in his right upper extremity with edema.  Skin: Skin is warm and dry. There is pallor.  Psychiatric: He has a normal mood  and affect.    ED Course  Procedures (including critical care time)  Labs Reviewed  CBC WITH DIFFERENTIAL - Abnormal; Notable for the following:    RBC 2.76 (*)     Hemoglobin 7.4 (*)     HCT 23.3 (*)     Neutrophils Relative 78 (*)     All other components within normal limits  COMPREHENSIVE METABOLIC PANEL - Abnormal; Notable for the following:    Sodium 134 (*)     Glucose, Bld 152 (*)     Calcium 7.8 (*)     Albumin 1.7 (*)     Alkaline Phosphatase 158 (*)     Total Bilirubin 0.2 (*)     GFR calc non Af Amer 63 (*)     GFR calc Af Amer 73 (*)     All other components within normal limits  SAMPLE TO BLOOD BANK   Dg Chest 2 View  07/28/2012  *RADIOLOGY REPORT*  Clinical Data: Colon cancer.  Anemia.  CHEST - 2 VIEW  Comparison: 07/08/2012.  Findings: Right upper extremity PICC is present with the tip at the cavoatrial junction.  Monitoring leads are projected over the chest.  Small bilateral pleural effusions are present with blunting of both costophrenic angles on the lateral view.  Associated atelectasis.  Small right airspace disease in the lower lobes adjacent to the effusions is difficult to exclude but not favored.Cardiopericardial silhouette appears within normal limits.  IMPRESSION:  1.  Unchanged right upper extremity PICC. 2. Small bilateral pleural effusions evident on the lateral view.   Original Report Authenticated By: Andreas Newport, M.D.      1. Anemia       MDM  Patient presents with anemia from rehab. Hgb is 7.4, trace guiac. Symptomatic with lighheadedness. Will admit to medicine. D/w [facility]. Will see in the morning.        Juliet Rude. Rubin Payor, MD 07/28/12 [phone]

## 2012-07-28 NOTE — H&P (Signed)
PCP:   Benita Stabile, MD   Chief Complaint:  Abnormal lab  HPI: 62 yo male s/p recent bka for gangrenous diabetic foot is in rehab sent from snf for hgb around 7.  No overt bleeding.  Pt denies any sob, cp.  He is tired a lot.  No n/v.  No fevers.  No abd pain.  No melena or brbpr.  Has h/o colon cancer and has had no colonoscopy over 2 years due to financial issues.  Still does not want one now until he gets thru rehab.  Review of Systems:  Positive and negative as per HPI otherwise all other systems are negative  Past Medical History: Past Medical History  Diagnosis Date  . ADENOCARCINOMA, COLON     S/P surgery/chemotherapy  . DM   . NEUROPATHY   . Kidney stones   . Large bowel obstruction   . Ischemic colon    Past Surgical History  Procedure Date  . 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 Ileostomy closure 10/31/2009  . Porta-cath insertion 08/08/2008  . Amputation 06/30/2012    Procedure: AMPUTATION BELOW KNEE;  Surgeon: Sherren Kerns, MD;  Location: [facility] OR;  Service: Vascular;  Laterality: Left;  . Intraoperative arteriogram 06/30/2012    Procedure: INTRA OPERATIVE ARTERIOGRAM;  Surgeon: Sherren Kerns, MD;  Location: [facility] OR;  Service: Vascular;  Laterality: Right;  . Aortogram 06/30/2012    Procedure: AORTOGRAM;  Surgeon: Sherren Kerns, MD;  Location: [facility] OR;  Service: Vascular;  Laterality: Right;  Right upper extremity arch   . Tee without cardioversion 07/05/2012    Procedure: TRANSESOPHAGEAL ECHOCARDIOGRAM (TEE);  Surgeon: Vesta Mixer, MD;  Location: [facility] ENDOSCOPY;  Service: Cardiovascular;  Laterality: N/A;    Medications: Prior to Admission medications   Medication Sig Start Date End Date Taking? Authorizing Provider  acetaminophen (TYLENOL) 325 MG tablet Take 2 tablets (650 mg total) by mouth every 6 (six) hours as needed for pain or fever. 07/10/12  Yes Cristal Ford, MD  albuterol (PROVENTIL) (5 MG/ML) 0.5% nebulizer solution Take 0.5 mLs (2.5 mg  total) by nebulization every 6 (six) hours as needed for wheezing or shortness of breath. 07/10/12  Yes Cristal Ford, MD  aspirin 325 MG tablet Take 1 tablet (325 mg total) by mouth daily. 07/10/12  Yes Srikar Cherlynn Kaiser, MD  chlorhexidine (PERIDEX) 0.12 % solution Use as directed 15 mLs in the mouth or throat 2 (two) times daily.   Yes Historical Provider, MD  dextrose 5 % SOLN 50 mL with cefTRIAXone 2 G SOLR 2 g Inject 2 g into the vein daily. Till 08/01/2012 07/10/12 08/01/12 Yes Srikar Cherlynn Kaiser, MD  insulin aspart (NOVOLOG) 100 UNIT/ML injection Inject 0-15 Units into the skin 3 (three) times daily with meals. If CBG above 150 give 5 units 15 minutes before or 30 minutes after a meal, subcutaneously and at bedtime. 07/10/12  Yes Srikar Cherlynn Kaiser, MD  insulin glargine (LANTUS) 100 UNIT/ML injection Inject 18 Units into the skin at bedtime. 07/10/12  Yes Srikar Cherlynn Kaiser, MD  Multiple Vitamins-Minerals (DECUBI-VITE) CAPS Take 1 capsule by mouth 2 (two) times daily.   Yes Historical Provider, MD  ondansetron (ZOFRAN) 4 MG/2ML SOLN injection Inject 2 mLs (4 mg total) into the vein every 6 (six) hours as needed for nausea. 07/10/12  Yes Srikar Cherlynn Kaiser, MD  OVER THE COUNTER MEDICATION Take 90 mLs by mouth 3 (three) times daily. 2  Cal supplement.   Yes Historical  Provider, MD  oxyCODONE (OXY IR/ROXICODONE) 5 MG immediate release tablet Take 1-2 tablets (5-10 mg total) by mouth every 4 (four) hours as needed for pain. 07/10/12  Yes Srikar Cherlynn Kaiser, MD  cholestyramine Lanetta Inch) 4 G packet Take 1 packet (4 g total) by mouth 3 (three) times daily. Till 07/17/2012 then discontinue. 07/10/12 07/17/12  Cristal Ford, MD  saccharomyces boulardii (FLORASTOR) 250 MG capsule Take 1 capsule (250 mg total) by mouth 2 (two) times daily. 07/10/12   Cristal Ford, MD    Allergies:  No Known Allergies  Social History:  reports that he has been smoking Cigars.  He has never used smokeless tobacco. He reports that he does not drink  alcohol or use illicit drugs.  Family History: Family History  Problem Relation Age of Onset  . Diabetes Mother   . Diabetes Father   . Heart failure Mother   . Heart failure Father   . Stroke Father   . Diabetes Sister   . Diabetes Sister   . Diabetes Brother     Physical Exam: Filed Vitals:   07/28/12 1522 07/28/12 1924 07/28/12 1957  BP: 97/82 122/54   Pulse: 89 77   Temp: 97.8 F (36.6 C)  98.1 F (36.7 C)  TempSrc: Oral    Resp: 24 23   SpO2: 100% 100%    General appearance: alert, cooperative and no distress Neck: no JVD and supple, symmetrical, trachea midline Lungs: clear to auscultation bilaterally Heart: regular rate and rhythm, S1, S2 normal, no murmur, click, rub or gallop Abdomen: soft, non-tender; bowel sounds normal; no masses,  no organomegaly Extremities: extremities normal, atraumatic, no cyanosis or edema left bka Pulses: 2+ and symmetric Skin: Skin color, texture, turgor normal. No rashes or lesions Neurologic: Grossly normal     Labs on Admission:   [facility] 07/28/12 1645  NA 134*  K 4.3  CL 99  CO2 27  GLUCOSE 152*  BUN 19  CREATININE 1.22  CALCIUM 7.8*  MG --  PHOS --    Basename 07/28/12 1645  AST 27  ALT 30  ALKPHOS 158*  BILITOT 0.2*  PROT 6.5  ALBUMIN 1.7*    Basename 07/28/12 1645  WBC 9.8  NEUTROABS 7.7  HGB 7.4*  HCT 23.3*  MCV 84.4  PLT 345   Radiological Exams on Admission:  Dg Chest 2 View  07/28/2012  *RADIOLOGY REPORT*  Clinical Data: Colon cancer.  Anemia.  CHEST - 2 VIEW  Comparison: 07/08/2012.  Findings: Right upper extremity PICC is present with the tip at the cavoatrial junction.  Monitoring leads are projected over the chest.  Small bilateral pleural effusions are present with blunting of both costophrenic angles on the lateral view.  Associated atelectasis.  Small right airspace disease in the lower lobes adjacent to the effusions is difficult to exclude but not favored.Cardiopericardial silhouette  appears within normal limits.  IMPRESSION:  1.  Unchanged right upper extremity PICC. 2. Small bilateral pleural effusions evident on the lateral view.   Original Report Authenticated By: Andreas Newport, M.D.     Assessment/Plan 62 yo male with symptomatic anemia no overt bleeding but hem pos stool h/o colon cancer  Principal Problem:  *Normocytic anemia Active Problems:  ADENOCARCINOMA, COLON 2010  DM  S/P unilateral BKA (below knee amputation) left  Weakness generalized  obs for transfusion.  Pt does not want gi w/u now he wants to wait until he recovers from his recent surgery.  Has not had any f/u  with gi in years.  GI consulted.  No overt bleeding.    Fardowsa Authier A 07/28/2012, 8:09 PM

## 2012-07-29 ENCOUNTER — Encounter (HOSPITAL_COMMUNITY): Payer: Self-pay | Admitting: Internal Medicine

## 2012-07-29 DIAGNOSIS — I6350 Cerebral infarction due to unspecified occlusion or stenosis of unspecified cerebral artery: Secondary | ICD-10-CM

## 2012-07-29 DIAGNOSIS — E119 Type 2 diabetes mellitus without complications: Secondary | ICD-10-CM

## 2012-07-29 DIAGNOSIS — I6930 Unspecified sequelae of cerebral infarction: Secondary | ICD-10-CM | POA: Diagnosis present

## 2012-07-29 DIAGNOSIS — A491 Streptococcal infection, unspecified site: Secondary | ICD-10-CM

## 2012-07-29 DIAGNOSIS — N182 Chronic kidney disease, stage 2 (mild): Secondary | ICD-10-CM | POA: Insufficient documentation

## 2012-07-29 DIAGNOSIS — R7881 Bacteremia: Secondary | ICD-10-CM

## 2012-07-29 LAB — BASIC METABOLIC PANEL
Calcium: 7.8 mg/dL — ABNORMAL LOW (ref 8.4–10.5)
Creatinine, Ser: 0.94 mg/dL (ref 0.50–1.35)
GFR calc Af Amer: >90 mL/min (ref >90)

## 2012-07-29 LAB — CBC
MCH: 26.7 pg (ref 26.0–34.0)
MCV: 83.2 fL (ref 78.0–100.0)
Platelets: 314 10*3/uL (ref 150–400)
RDW: 14.9 % (ref 11.5–15.5)

## 2012-07-29 LAB — PREPARE RBC (CROSSMATCH)

## 2012-07-29 LAB — GLUCOSE, CAPILLARY
Glucose-Capillary: 150 mg/dL — ABNORMAL HIGH (ref 70–99)
Glucose-Capillary: 69 mg/dL — ABNORMAL LOW (ref 70–99)
Glucose-Capillary: 145 mg/dL — ABNORMAL HIGH (ref 70–99)
Glucose-Capillary: 196 mg/dL — ABNORMAL HIGH (ref 70–99)

## 2012-07-29 MED ORDER — FUROSEMIDE 10 MG/ML IJ SOLN
20.0000 mg | Freq: Once | INTRAMUSCULAR | Status: AC
  Administered 2012-07-29: 20 mg via INTRAVENOUS
  Filled 2012-07-29: qty 2

## 2012-07-29 MED ORDER — INSULIN GLARGINE 100 UNIT/ML ~~LOC~~ SOLN
14.0000 [IU] | Freq: Every day | SUBCUTANEOUS | Status: DC
  Administered 2012-07-29: 14 [IU] via SUBCUTANEOUS

## 2012-07-29 MED ORDER — ASPIRIN 325 MG PO TABS
325.0000 mg | ORAL_TABLET | Freq: Every day | ORAL | Status: DC
  Administered 2012-07-29 – 2012-07-30 (×2): 325 mg via ORAL
  Filled 2012-07-29 (×2): qty 1

## 2012-07-29 MED ORDER — ACETAMINOPHEN 325 MG PO TABS
650.0000 mg | ORAL_TABLET | Freq: Once | ORAL | Status: AC
  Administered 2012-07-29: 650 mg via ORAL
  Filled 2012-07-29: qty 2

## 2012-07-29 MED ORDER — INSULIN ASPART 100 UNIT/ML ~~LOC~~ SOLN
0.0000 [IU] | Freq: Three times a day (TID) | SUBCUTANEOUS | Status: DC
  Administered 2012-07-29: 1 [IU] via SUBCUTANEOUS
  Administered 2012-07-29: 2 [IU] via SUBCUTANEOUS

## 2012-07-29 MED ORDER — PANTOPRAZOLE SODIUM 40 MG PO TBEC
40.0000 mg | DELAYED_RELEASE_TABLET | Freq: Every day | ORAL | Status: DC
  Administered 2012-07-29 – 2012-07-30 (×2): 40 mg via ORAL
  Filled 2012-07-29 (×2): qty 1

## 2012-07-29 MED ORDER — DIPHENHYDRAMINE HCL 25 MG PO CAPS
25.0000 mg | ORAL_CAPSULE | Freq: Once | ORAL | Status: AC
  Administered 2012-07-29: 25 mg via ORAL
  Filled 2012-07-29: qty 1

## 2012-07-29 NOTE — Progress Notes (Signed)
[facility] PROGRESS NOTE  Tyler Erickson OZH:[id number] DOB: [birth year] DOA: 07/28/2012 PCP: Benita Stabile, MD  Assessment/Plan:  #1 microcytic symptomatic anemia Questionable etiology. Patient does have a prior history of colon cancer. FOBT was negative. Patient's hemoglobin is at 8.4 from 7.41 admission. Patient denies any intake use. Patient denies any melanotic stools. Patient denies any hematemesis no overt GI bleed. Patient is status post 1 unit of packed red blood cells. Patient is hesitant to undergo a colonoscopy until post rehabilitation secondary to bowel prep and patient wanting to be able to manage his bowel prep on his own. Per admission H&P GI has already been consulted. Will follow.  #2 group A streptococcus bacteremia Continue IV Rocephin through 08/02/2011.  #3 uncontrolled diabetes mellitus Hemoglobin A1c was 10.9 on 07/05/2012. CBGs have ranged from 69-150. Decrease Lantus from 18 units to home dose of 14 units daily. Will discontinue meal coverage insulin. We'll place on sliding scale insulin. Follow.  #4 history of CVA  Continue full dose aspirin.  #5 status post left BKA PT/ OT. Followup with vascular surgery as outpatient.  #6 prophylaxis PPI for GI prophylaxis. SCDs for DVT prophylaxis.  Code Status: Full Family Communication : Updated patient and sister at bedside Disposition Plan: Back to skilled nursing facility when medically stable   Consultants:  None  Procedures:  1 unit packed red blood cells 07/28/2012  Antibiotics:  IV Rocephin 07/28/2012 resumed from home meds  HPI/Subjective: Patient denies any overt GI bleed. Patient states he's feeling better. Patient seems hesitant to have a potential colonoscopy until post rehabilitation. Patient denies any chest pain. Patient denies any shortness of breath.  Objective: Filed Vitals:   07/29/12 0015 07/29/12 0124 07/29/12 0200 07/29/12 0600  BP: 124/70 143/68 136/70 132/66  Pulse:  74 76 72 80  Temp: 98.2 F (36.8 C) 98.9 F (37.2 C) 98.4 F (36.9 C) 98.5 F (36.9 C)  TempSrc: Oral Oral Oral Oral  Resp: 18 18 18 18   Height:      Weight:      SpO2:    97%    Intake/Output Summary (Last 24 hours) at 07/29/12 0926 Last data filed at 07/29/12 0743  Gross per 24 hour  Intake    375 ml  Output    700 ml  Net   -325 ml   Filed Weights   07/28/12 2155  Weight: 65.772 kg (145 lb)    Exam:   General:  NAD  Cardiovascular: RRR. Right lower extremity with no edema. Status post left BKA.  Respiratory: CTAB anterior lung fields  Abdomen: Soft, nontender, nondistended, positive bowel sounds.  Data Reviewed: Basic Metabolic Panel:  Lab 07/29/12 6295 07/28/12 1645  NA 137 134*  K 3.9 4.3  CL 101 99  CO2 29 27  GLUCOSE 74 152*  BUN 16 19  CREATININE 0.94 1.22  CALCIUM 7.8* 7.8*  MG -- --  PHOS -- --   Liver Function Tests:  Lab 07/28/12 1645  AST 27  ALT 30  ALKPHOS 158*  BILITOT 0.2*  PROT 6.5  ALBUMIN 1.7*   No results found for this basename: LIPASE:5,AMYLASE:5 in the last 168 hours No results found for this basename: AMMONIA:5 in the last 168 hours CBC:  Lab 07/29/12 0530 07/28/12 1645  WBC 7.9 9.8  NEUTROABS -- 7.7  HGB 8.4* 7.4*  HCT 26.2* 23.3*  MCV 83.2 84.4  PLT 314 345   Cardiac Enzymes: No results found for this basename: CKTOTAL:5,CKMB:5,CKMBINDEX:5,TROPONINI:5 in the last 168  hours BNP (last 3 results) No results found for this basename: PROBNP:3 in the last 8760 hours CBG:  Lab 07/29/12 0747 07/28/12 2144  GLUCAP 69* 150*    Recent Results (from the past 240 hour(s))  MRSA PCR SCREENING     Status: Normal   Collection Time   07/29/12  6:01 AM      Component Value Range Status Comment   MRSA by PCR NEGATIVE  NEGATIVE Final      Studies: Dg Chest 2 View  07/28/2012  *RADIOLOGY REPORT*  Clinical Data: Colon cancer.  Anemia.  CHEST - 2 VIEW  Comparison: 07/08/2012.  Findings: Right upper extremity PICC is  present with the tip at the cavoatrial junction.  Monitoring leads are projected over the chest.  Small bilateral pleural effusions are present with blunting of both costophrenic angles on the lateral view.  Associated atelectasis.  Small right airspace disease in the lower lobes adjacent to the effusions is difficult to exclude but not favored.Cardiopericardial silhouette appears within normal limits.  IMPRESSION:  1.  Unchanged right upper extremity PICC. 2. Small bilateral pleural effusions evident on the lateral view.   Original Report Authenticated By: Andreas Newport, M.D.     Scheduled Meds:   . aspirin  325 mg Oral Daily  . cefTRIAXone (ROCEPHIN) IVPB 2 gram/50 mL D5W (Pyxis)  2 g Intravenous Q24H  . insulin aspart  5 Units Subcutaneous TID WC   And  . insulin aspart  5 Units Subcutaneous QHS  . insulin glargine  18 Units Subcutaneous QHS  . sodium chloride  3 mL Intravenous Q12H   Continuous Infusions:   Principal Problem:  *Normocytic anemia Active Problems:  ADENOCARCINOMA, COLON 2010  DM  NEUROPATHY  Severe protein-calorie malnutrition  S/P unilateral BKA (below knee amputation) left  Weakness generalized  Bacteremia due to Streptococcus  CVA (cerebral infarction)    Time spent: > 35 mins    [facility]  [facility] Pager [phone]. If 8PM-8AM, please contact night-coverage at [email or web address], password [facility] 07/29/2012, 9:26 AM  LOS: 1 day

## 2012-07-29 NOTE — Consult Note (Signed)
Reason for Consult: History of colon cancer one time trace guaiac slight worsening anemia Referring Physician: Hospital team  Tyler Erickson is an 62 y.o. male.  HPI: Patient seen at the request of the hospital team for above problems although he has no GI complaint and has not seen any blood and his previous colonoscopy by another gastroenterologist was reviewed and he says he is not on any aspirin or arthritis pills or blood thinners at the nursing home and he is still recovering from his recent surgery and specifically he has no swallowing problems upper tract symptoms or abdominal pain or problems with his bowel movement and his family history is negative from a GI standpoint  Past Medical History  Diagnosis Date  . ADENOCARCINOMA, COLON     S/P surgery/chemotherapy  . DM   . NEUROPATHY   . Kidney stones   . Large bowel obstruction   . Ischemic colon   . 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 CVA (cerebral infarction) 07/29/2012    06/29/12    Past Surgical History  Procedure Date  . 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 Ileostomy closure 10/31/2009  . Porta-cath insertion 08/08/2008  . Amputation 06/30/2012    Procedure: AMPUTATION BELOW KNEE;  Surgeon: Sherren Kerns, MD;  Location: [facility] OR;  Service: Vascular;  Laterality: Left;  . Intraoperative arteriogram 06/30/2012    Procedure: INTRA OPERATIVE ARTERIOGRAM;  Surgeon: Sherren Kerns, MD;  Location: [facility] OR;  Service: Vascular;  Laterality: Right;  . Aortogram 06/30/2012    Procedure: AORTOGRAM;  Surgeon: Sherren Kerns, MD;  Location: [facility] OR;  Service: Vascular;  Laterality: Right;  Right upper extremity arch   . Tee without cardioversion 07/05/2012    Procedure: TRANSESOPHAGEAL ECHOCARDIOGRAM (TEE);  Surgeon: Vesta Mixer, MD;  Location: [facility] ENDOSCOPY;  Service: Cardiovascular;  Laterality: N/A;    Family History  Problem Relation Age of Onset  . Diabetes Mother   . Diabetes Father   . Heart failure Mother    . Heart failure Father   . Stroke Father   . Diabetes Sister   . Diabetes Sister   . Diabetes Brother     Social History:  reports that he has been smoking Cigars.  He has never used smokeless tobacco. He reports that he does not drink alcohol or use illicit drugs.  Allergies: No Known Allergies  Medications: I have reviewed the patient's current medications.  Results for orders placed during the hospital encounter of 07/28/12 (from the past 48 hour(s))  CBC WITH DIFFERENTIAL     Status: Abnormal   Collection Time   07/28/12  4:45 PM      Component Value Range Comment   WBC 9.8  4.0 - 10.5 K/uL    RBC 2.76 (*) 4.22 - 5.81 MIL/uL    Hemoglobin 7.4 (*) 13.0 - 17.0 g/dL    HCT 29.5 (*) 62.1 - 52.0 %    MCV 84.4  78.0 - 100.0 fL    MCH 26.8  26.0 - 34.0 pg    MCHC 31.8  30.0 - 36.0 g/dL    RDW 30.8  65.7 - 84.6 %    Platelets 345  150 - 400 K/uL    Neutrophils Relative 78 (*) 43 - 77 %    Neutro Abs 7.7  1.7 - 7.7 K/uL    Lymphocytes Relative 14  12 - 46 %    Lymphs Abs 1.4  0.7 - 4.0 K/uL  Monocytes Relative 7  3 - 12 %    Monocytes Absolute 0.6  0.1 - 1.0 K/uL    Eosinophils Relative 1  0 - 5 %    Eosinophils Absolute 0.1  0.0 - 0.7 K/uL    Basophils Relative 0  0 - 1 %    Basophils Absolute 0.0  0.0 - 0.1 K/uL   COMPREHENSIVE METABOLIC PANEL     Status: Abnormal   Collection Time   07/28/12  4:45 PM      Component Value Range Comment   Sodium 134 (*) 135 - 145 mEq/L    Potassium 4.3  3.5 - 5.1 mEq/L    Chloride 99  96 - 112 mEq/L    CO2 27  19 - 32 mEq/L    Glucose, Bld 152 (*) 70 - 99 mg/dL    BUN 19  6 - 23 mg/dL    Creatinine, Ser 1.61  0.50 - 1.35 mg/dL    Calcium 7.8 (*) 8.4 - 10.5 mg/dL    Total Protein 6.5  6.0 - 8.3 g/dL    Albumin 1.7 (*) 3.5 - 5.2 g/dL    AST 27  0 - 37 U/L    ALT 30  0 - 53 U/L    Alkaline Phosphatase 158 (*) 39 - 117 U/L    Total Bilirubin 0.2 (*) 0.3 - 1.2 mg/dL    GFR calc non Af Amer 63 (*) >90 mL/min    GFR calc Af Amer 73 (*)  >90 mL/min   SAMPLE TO BLOOD BANK     Status: Normal   Collection Time   07/28/12  4:45 PM      Component Value Range Comment   Blood Bank Specimen SAMPLE AVAILABLE FOR TESTING      Sample Expiration 07/31/2012     TYPE AND SCREEN     Status: Normal (Preliminary result)   Collection Time   07/28/12  4:45 PM      Component Value Range Comment   ABO/RH(D) O POS      Antibody Screen NEG      Sample Expiration 07/31/2012      Unit Number [id number]      Blood Component Type RED CELLS,LR      Unit division 00      Status of Unit ISSUED      Transfusion Status OK TO TRANSFUSE      Crossmatch Result Compatible     GLUCOSE, CAPILLARY     Status: Abnormal   Collection Time   07/28/12  9:44 PM      Component Value Range Comment   Glucose-Capillary 150 (*) 70 - 99 mg/dL   PREPARE RBC (CROSSMATCH)     Status: Normal   Collection Time   07/28/12 10:00 PM      Component Value Range Comment   Order Confirmation ORDER PROCESSED BY BLOOD BANK     OCCULT BLOOD X 1 CARD TO LAB, STOOL     Status: Normal   Collection Time   07/28/12 11:32 PM      Component Value Range Comment   Fecal Occult Bld NEGATIVE  NEGATIVE   CBC     Status: Abnormal   Collection Time   07/29/12  5:30 AM      Component Value Range Comment   WBC 7.9  4.0 - 10.5 K/uL    RBC 3.15 (*) 4.22 - 5.81 MIL/uL    Hemoglobin 8.4 (*) 13.0 - 17.0 g/dL    HCT  26.2 (*) 39.0 - 52.0 %    MCV 83.2  78.0 - 100.0 fL    MCH 26.7  26.0 - 34.0 pg    MCHC 32.1  30.0 - 36.0 g/dL    RDW 96.0  45.4 - 09.8 %    Platelets 314  150 - 400 K/uL   BASIC METABOLIC PANEL     Status: Abnormal   Collection Time   07/29/12  5:30 AM      Component Value Range Comment   Sodium 137  135 - 145 mEq/L    Potassium 3.9  3.5 - 5.1 mEq/L    Chloride 101  96 - 112 mEq/L    CO2 29  19 - 32 mEq/L    Glucose, Bld 74  70 - 99 mg/dL    BUN 16  6 - 23 mg/dL    Creatinine, Ser 1.19  0.50 - 1.35 mg/dL    Calcium 7.8 (*) 8.4 - 10.5 mg/dL    GFR calc non Af Amer 89 (*)  >90 mL/min    GFR calc Af Amer >90  >90 mL/min   MAGNESIUM     Status: Normal   Collection Time   07/29/12  5:30 AM      Component Value Range Comment   Magnesium 1.9  1.5 - 2.5 mg/dL   MRSA PCR SCREENING     Status: Normal   Collection Time   07/29/12  6:01 AM      Component Value Range Comment   MRSA by PCR NEGATIVE  NEGATIVE   GLUCOSE, CAPILLARY     Status: Abnormal   Collection Time   07/29/12  7:47 AM      Component Value Range Comment   Glucose-Capillary 69 (*) 70 - 99 mg/dL    Comment 1 Notify RN       Dg Chest 2 View  07/28/2012  *RADIOLOGY REPORT*  Clinical Data: Colon cancer.  Anemia.  CHEST - 2 VIEW  Comparison: 07/08/2012.  Findings: Right upper extremity PICC is present with the tip at the cavoatrial junction.  Monitoring leads are projected over the chest.  Small bilateral pleural effusions are present with blunting of both costophrenic angles on the lateral view.  Associated atelectasis.  Small right airspace disease in the lower lobes adjacent to the effusions is difficult to exclude but not favored.Cardiopericardial silhouette appears within normal limits.  IMPRESSION:  1.  Unchanged right upper extremity PICC. 2. Small bilateral pleural effusions evident on the lateral view.   Original Report Authenticated By: Andreas Newport, M.D.     ROS negative except above Blood pressure 132/66, pulse 80, temperature 98.5 F (36.9 C), temperature source Oral, resp. rate 18, height 5' 6.5" (1.689 m), weight 65.772 kg (145 lb), SpO2 97.00%. Physical Exam Vital signs stable afebrile no acute distress abdomen is soft nontender hemoglobin stable and 2 negative guaiacs here and he tells me 1 recently was negative Assessment/Plan: Multiple medical problems in a patient with history of colon cancer due for repeat colon screening Plan: When he is stable from a postop standpoint I am happy to see back in proceed with colonoscopy or he can go back to his previous gastroenterologist and please call  us if we can be of any further assistance during this hospital stay [facility] E 07/29/2012, 11:54 AM

## 2012-07-29 NOTE — Progress Notes (Signed)
Case discussed with ER physician and hospital computer reviewed as well as Dr. Carollee Massed note today and agree when stable will need a colonoscopy and it can be done as an outpatient when he has healed and I am happy to see back as an outpatient or he can go back to Dr. Arlyce Dice who did his previous colonoscopy and please call us this hospital stay if we could be of any further assistance

## 2012-07-29 NOTE — Progress Notes (Signed)
Routine assessment of RUA -SL PICC. Right arm very edematous from PICC site down to Right hand. R arm warm,radial pulse present. Elevated Right arm on pillow. Left arm with some edema at  Left lower forearm noted. No edema of Left hand noted.. Staff RN to notify MD for further evaluation.

## 2012-07-30 LAB — TYPE AND SCREEN: Unit division: 0

## 2012-07-30 LAB — CBC
MCH: 26.2 pg (ref 26.0–34.0)
Platelets: 300 10*3/uL (ref 150–400)
RBC: 3.7 MIL/uL — ABNORMAL LOW (ref 4.22–5.81)
RDW: 14.9 % (ref 11.5–15.5)
WBC: 9.2 10*3/uL (ref 4.0–10.5)

## 2012-07-30 LAB — GLUCOSE, CAPILLARY
Glucose-Capillary: 147 mg/dL — ABNORMAL HIGH (ref 70–99)
Glucose-Capillary: 69 mg/dL — ABNORMAL LOW (ref 70–99)

## 2012-07-30 LAB — BASIC METABOLIC PANEL
CO2: 29 mEq/L (ref 19–32)
Calcium: 7.9 mg/dL — ABNORMAL LOW (ref 8.4–10.5)
GFR calc Af Amer: 87 mL/min — ABNORMAL LOW (ref >90)
Sodium: 135 mEq/L (ref 135–145)

## 2012-07-30 MED ORDER — OXYCODONE HCL 5 MG PO TABS: 5.0000 mg | ORAL_TABLET | ORAL | Status: DC | PRN

## 2012-07-30 MED ORDER — INSULIN GLARGINE 100 UNIT/ML ~~LOC~~ SOLN: 14.0000 [IU] | Freq: Every day | SUBCUTANEOUS | Status: DC

## 2012-07-30 NOTE — Evaluation (Signed)
Physical Therapy Evaluation Patient Details Name: Tyler Erickson MRN: [id number] DOB: 04/08/1951 Today's Date: 07/30/2012 Time: 4098-1191 PT Time Calculation (min): 30 min  PT Assessment / Plan / Recommendation Clinical Impression  Pt presents with normocytic anemia with recent D/C from [place] with L BKA.  Also noted history of CVA, DM, diabetic neuropathy, and colon cancer.  Pt now at [facility] for rehabilitation and is standing and hopping with therapy.  Tolerated OOB to 3in1 then to recliner today well with RW and min assist.  Pt will benefit from skilled PT in acute venue to address deficits.  PT recommends returning to SNF to maximize pts safety and functional mobiltiy.      PT Assessment  Patient needs continued PT services    Follow Up Recommendations  SNF;Supervision/Assistance - 24 hour    Does the patient have the potential to tolerate intense rehabilitation      Barriers to Discharge None Pt returning to SNF    Equipment Recommendations  None recommended by PT    Recommendations for Other Services     Frequency Min 3X/week    Precautions / Restrictions Precautions Precautions: Fall Precaution Comments: RUE edema with noted area of scabbing?.  Recent BKA on LLE Required Braces or Orthoses: Other Brace/Splint Other Brace/Splint: LLE post op hard shell shrinker on BKA Restrictions Weight Bearing Restrictions: No   Pertinent Vitals/Pain Pt states minimal pain in UEs during session.       Mobility  Bed Mobility Bed Mobility: Supine to Sit;Sitting - Scoot to Edge of Bed Supine to Sit: 5: Supervision;HOB elevated;With rails Sitting - Scoot to Edge of Bed: 5: Supervision Details for Bed Mobility Assistance: Supervision for safety.  Pt demos good use of LUE to self assist to EOB.   Transfers Transfers: Sit to Stand;Stand to Sit;Stand Pivot Transfers Sit to Stand: 4: Min assist;From elevated surface;With upper extremity assist;From bed Stand to Sit: 4: Min  assist;With upper extremity assist;With armrests;To chair/3-in-1 Stand Pivot Transfers: 4: Min assist;With armrests Details for Transfer Assistance: Assist to rise and steady with cues for hand placement and safety. Performed x 2 to stand from 3in1.   Able to pivot on RLE to 3in1 with min assist and cues for hand placement/technique.   Ambulation/Gait Ambulation/Gait Assistance: Not tested (comment) Ambulation/Gait Assistance Details: Not tested due to pt using R platform RW at [facility] to support RUE.     Shoulder Instructions     Exercises     PT Diagnosis: Abnormality of gait;Generalized weakness;Acute pain  PT Problem List: Decreased strength;Decreased activity tolerance;Decreased balance;Decreased mobility;Decreased knowledge of use of DME;Decreased range of motion;Decreased coordination;Pain PT Treatment Interventions: DME instruction;Gait training;Functional mobility training;Therapeutic activities;Therapeutic exercise;Balance training;Patient/family education;Wheelchair mobility training   PT Goals Acute Rehab PT Goals PT Goal Formulation: With patient Time For Goal Achievement: 08/13/12 Potential to Achieve Goals: Good Pt will go Supine/Side to Sit: with modified independence PT Goal: Supine/Side to Sit - Progress: Goal set today Pt will go Sit to Supine/Side: with modified independence PT Goal: Sit to Supine/Side - Progress: Goal set today Pt will go Sit to Stand: with supervision PT Goal: Sit to Stand - Progress: Goal set today Pt will go Stand to Sit: with supervision PT Goal: Stand to Sit - Progress: Goal set today Pt will Transfer Bed to Chair/Chair to Bed: with supervision PT Transfer Goal: Bed to Chair/Chair to Bed - Progress: Goal set today Pt will Ambulate: 1 - 15 feet;with min assist;with least restrictive assistive device PT  Goal: Ambulate - Progress: Goal set today  Visit Information  Last PT Received On: 07/30/12 Assistance Needed: +1 (+2 if hopping)     Subjective Data  Subjective: what are you going to do about getting me up? Patient Stated Goal: To return to rehab   Prior Functioning  Home Living Available Help at Discharge: Skilled Nursing Facility Type of Home: Skilled Nursing Facility Additional Comments: Pt at [facility] prior to this admission and states that he was working with therapy on UE strengthening, standing, and able to hop.  He was also self propelling w/c and able to negotiate in/out of restroom.  Prior Function Level of Independence: Needs assistance Needs Assistance: Dressing Dressing: Minimal Able to Take Stairs?: No Driving: No Vocation: On disability Communication Communication: No difficulties    Cognition  Overall Cognitive Status: Appears within functional limits for tasks assessed/performed Arousal/Alertness: Awake/alert Orientation Level: Appears intact for tasks assessed Behavior During Session: Agitated Cognition - Other Comments: Pt somewhat agitated when asked about RUE wound/swelling.  Also states that he takes his time when performing ADL's.     Extremity/Trunk Assessment Right Lower Extremity Assessment RLE ROM/Strength/Tone: WFL for tasks assessed RLE ROM/Strength/Tone Deficits: Grossly 3+/5 per functional tasks.  Left Lower Extremity Assessment LLE ROM/Strength/Tone: Unable to fully assess;Deficits LLE ROM/Strength/Tone Deficits: Pt with recent BKA, able to move residual limb well when getting to EOB.  Able to perform knee ext with 3/5 strength, however needs some assist for SLR.  LLE Sensation: History of peripheral neuropathy   Balance Balance Balance Assessed: Yes Static Sitting Balance Static Sitting - Balance Support: Bilateral upper extremity supported (right foot supported. ) Static Sitting - Level of Assistance: 7: Independent Static Standing Balance Static Standing - Balance Support: Bilateral upper extremity supported Static Standing - Level of Assistance: 4: Min  assist Static Standing - Comment/# of Minutes: Pt able to stand at RW at min/guard to min assist level.   End of Session PT - End of Session Equipment Utilized During Treatment: Gait belt Activity Tolerance: Patient tolerated treatment well Patient left: in chair;with call bell/phone within reach Nurse Communication: Mobility status  GP     Vista Deck 07/30/2012, 10:41 AM

## 2012-07-30 NOTE — Progress Notes (Signed)
Report called to Nurse Okey Regal at nursing facility.

## 2012-07-30 NOTE — Discharge Summary (Signed)
Physician Discharge Summary  Tyler Erickson UJW:[id number] DOB: [birth year] DOA: 07/28/2012  PCP: Benita Stabile, MD  Admit date: 07/28/2012 Discharge date: 07/30/2012  Time spent: 65 minutes  Recommendations for Outpatient Follow-up:  1. Patient is to followup with Dr. Arlyce Dice of gastroenterology in 2-3 weeks postdischarge for further evaluation of his anemia possible repeat colonoscopy. 2. Patient is to followup with M.D. at the skilled nursing facility. 3. Patient is to followup with Dr. Orvan Falconer of infectious diseases in 1 month as previously scheduled. 4. Patient is to followup with Dr. Darrick Penna as scheduled in one month. Staples remain in place until seen by Dr. Darrick Penna.  Discharge Diagnoses:  Principal Problem:  *Normocytic anemia Active Problems:  ADENOCARCINOMA, COLON 2010  DM  NEUROPATHY  Severe protein-calorie malnutrition  S/P unilateral BKA (below knee amputation) left  Weakness generalized  Bacteremia due to Streptococcus  CVA (cerebral infarction)   Discharge Condition: Stable and improved  Diet recommendation: Carb modified diet  Filed Weights   07/28/12 2155  Weight: 65.772 kg (145 lb)    History of present illness:  62 yo male s/p recent bka for gangrenous diabetic foot is in rehab sent from snf for hgb around 7. No overt bleeding. Pt denies any sob, cp. He is tired a lot. No n/v. No fevers. No abd pain. No melena or brbpr. Has h/o colon cancer and has had no colonoscopy over 2 years due to financial issues. Still does not want one now until he gets thru rehab.   Hospital Course:  #1 microcytic symptomatic anemia  Patient was transferred from skilled nursing facility secondary to a hemoglobin of 7. Patient denied any overt GI bleed. Questionable etiology. Patient does have a prior history of colon cancer. FOBT was negative. Patient was admitted to the telemetry floor where he was monitored. Patient was transfused 2 units of packed red blood cells with  appropriate response with a hemoglobin, up to 9.7 by day of discharge. Patient denied any NSAID use. Patient denied any melanotic stools. Patient denied any hematemesis and no overt GI bleed.  Patient is hesitant to undergo a colonoscopy until post rehabilitation secondary to bowel prep and patient wanting to be able to manage his bowel prep on his own. A GI consultation was obtained and patient was seen in consultation by Dr. Ewing Schlein, who was in agreement with transfusion and felt patient will be followed up as outpatient unit with his prior gastroenterologist Dr. Arlyce Dice of with him for repeat colonoscopy for further evaluation of his anemia in light of his prior history of colon cancer. Patient remained in stable condition he'll be discharged in stable and improved condition and is to followup with gastroenterology as outpatient.   #2 group A streptococcus bacteremia  During patient's last hospitalization he was diagnosed with a grade group A streptococcus bacteremia which was being followed by infectious diseases. Patient had been seen by Dr. Orvan Falconer of infectious disease on 07/25/2012 and was recommended that patient continue his IV antibiotics until February 4. Patient is to followup in ID clinic in 1 month.  #3 uncontrolled diabetes mellitus  Hemoglobin A1c was 10.9 on 07/05/2012. CBGs have ranged from 69-150 during the hospitalization. Patient's Lantus was decreased to 14 units daily and was maintained on a sliding scale insulin. #4 history of CVA  Continue full dose aspirin.  #5 status post left BKA  PT/ OT. Followup with vascular surgery as outpatient.  #6 bilateral upper extremity edema right greater than left with right arm weakness This was  diagnosed during last hospitalization and it was felt to be secondary to an acute brachial neuropathy. During patient's prior hospitalization 06/29/12-07/11/12, patient had compartment pressures determined by her orthopedics in the OR and was felt not to be  elevated. Patient was noted to have multifocal cervical spinal stenosis with mild spinal cord mass effect moderate to severe left C7 foraminal stenosis. On image and patient had bilateral cervical spinal stenosis worse on the left but patient's symptoms on the right. Patient did have venous Dopplers done which were negative for DVT. Patient also had CT of his chest abdomen and pelvis with did not show an etiology as well as edema during his last hospitalization. Patient's edema remained stable during this hospitalization and to followup as outpatient.  The rest of patient's chronic medical issues remained stable throughout the hospitalization and patient be discharged in stable and improved condition.      Procedures:  2 units packed red blood cells 07/28/2012, 07/29/2012  Chest x-ray 07/28/2012  Consultations:  Gastroenterology: Dr. Ewing Schlein 07/29/2012  Discharge Exam: Filed Vitals:   07/29/12 1538 07/29/12 1637 07/29/12 2209 07/30/12 0621  BP: 146/71 148/76 152/70 147/69  Pulse: 76 74 75 78  Temp: 98 F (36.7 C) 98 F (36.7 C) 98.4 F (36.9 C) 97.7 F (36.5 C)  TempSrc: Oral Oral Oral Oral  Resp: 18 18 18 18   Height:      Weight:      SpO2:  97% 97% 97%    General: NAD Cardiovascular: RRR. Rue EDEMA UNCHANGED FROM LAST HOSPITALIZATION. Respiratory: CTAB  Discharge Instructions  Discharge Orders    Future Appointments: Provider: Department: Dept Phone: Center:   08/03/2012 8:30 AM Sherren Kerns, MD [facility] [phone] [facility]   08/29/2012 3:00 PM Cliffton Asters, MD [facility] [phone] [facility]     Future Orders Please Complete By Expires   Diet Carb Modified      Increase activity slowly      Discharge instructions      Comments:   Follow up with Dr Arlyce Dice, GI as outpatient for repeat colonoscopy and evaluation of anemia. Follow up with ID as scheduled. Keep RUE elevated at rest.        Medication List     As of 07/30/2012 10:06 AM    TAKE these medications         acetaminophen 325 MG tablet   Commonly known as: TYLENOL   Take 2 tablets (650 mg total) by mouth every 6 (six) hours as needed for pain or fever.      albuterol (5 MG/ML) 0.5% nebulizer solution   Commonly known as: PROVENTIL   Take 0.5 mLs (2.5 mg total) by nebulization every 6 (six) hours as needed for wheezing or shortness of breath.      aspirin 325 MG tablet   Take 1 tablet (325 mg total) by mouth daily.      chlorhexidine 0.12 % solution   Commonly known as: PERIDEX   Use as directed 15 mLs in the mouth or throat 2 (two) times daily.      cholestyramine 4 G packet   Commonly known as: QUESTRAN   Take 1 packet (4 g total) by mouth 3 (three) times daily. Till 07/17/2012 then discontinue.      Decubi-Vite Caps   Take 1 capsule by mouth 2 (two) times daily.      dextrose 5 % SOLN 50 mL with cefTRIAXone 2 G SOLR 2 g  Inject 2 g into the vein daily. Till 08/01/2012      insulin aspart 100 UNIT/ML injection   Commonly known as: novoLOG   Inject 5 Units into the skin 3 (three) times daily before meals. If CBG above 150, give 5 units subcutaneously 15 minutes before or 30 minutes after a meal and at bedtime.      insulin glargine 100 UNIT/ML injection   Commonly known as: LANTUS   Inject 14 Units into the skin at bedtime.      ondansetron 4 MG/2ML Soln injection   Commonly known as: ZOFRAN   Inject 2 mLs (4 mg total) into the vein every 6 (six) hours as needed for nausea.      OVER THE COUNTER MEDICATION   Take 90 mLs by mouth 3 (three) times daily. 2  Cal supplement.      oxyCODONE 5 MG immediate release tablet   Commonly known as: Oxy IR/ROXICODONE   Take 1-2 tablets (5-10 mg total) by mouth every 4 (four) hours as needed for pain.      saccharomyces boulardii 250 MG capsule   Commonly known as: FLORASTOR   Take 1 capsule (250 mg total) by mouth 2 (two) times daily.            Follow-up Information    Follow up with Melvia Heaps, MD. In 2 weeks. (f/u in 2-3 weeks)    Contact information:   [address] [place] [address] [phone]       Please follow up. (Followup with M.D. at the skilled nursing facility)           The results of significant diagnostics from this hospitalization (including imaging, microbiology, ancillary and laboratory) are listed below for reference.    Significant Diagnostic Studies: Dg Chest 1 View  07/08/2012  *RADIOLOGY REPORT*  Clinical Data: Status post thoracentesis.  CHEST - 1 VIEW  Comparison: Chest CT 07/08/2012.  Findings: The right-sided PICC line is stable.  The heart is stable.  No definite pleural effusions are identified.  No postprocedural pneumothorax.  IMPRESSION: No definite pleural effusions and no postprocedural pneumothorax.   Original Report Authenticated By: Rudie Meyer, M.D.    Dg Chest 2 View  07/28/2012  *RADIOLOGY REPORT*  Clinical Data: Colon cancer.  Anemia.  CHEST - 2 VIEW  Comparison: 07/08/2012.  Findings: Right upper extremity PICC is present with the tip at the cavoatrial junction.  Monitoring leads are projected over the chest.  Small bilateral pleural effusions are present with blunting of both costophrenic angles on the lateral view.  Associated atelectasis.  Small right airspace disease in the lower lobes adjacent to the effusions is difficult to exclude but not favored.Cardiopericardial silhouette appears within normal limits.  IMPRESSION:  1.  Unchanged right upper extremity PICC. 2. Small bilateral pleural effusions evident on the lateral view.   Original Report Authenticated By: Andreas Newport, M.D.    Ct Head Wo Contrast  07/01/2012  *RADIOLOGY REPORT*  Clinical Data: Right arm weakness.  Rule out stroke.  CT HEAD WITHOUT CONTRAST  Technique:  Contiguous axial images were obtained from the base of the skull through the vertex without contrast.  Comparison: None.  Findings: Mild atrophy.   Negative for acute infarct.  Negative for hemorrhage or mass lesion.  No fluid collection is present. Calvarium is intact.  IMPRESSION: Mild atrophy.  No acute abnormality.   Original Report Authenticated By: Janeece Riggers, M.D.    Ct Angio Chest Pe W/cm &/or Wo  Cm  07/08/2012  *RADIOLOGY REPORT*  Clinical Data:   Bilateral upper extremity swelling.  History of colon cancer.  CT ANGIOGRAPHY CHEST CT ABDOMEN AND PELVIS WITH CONTRAST  Technique:  Multidetector CT imaging of the chest was performed using the standard protocol during bolus administration of intravenous contrast.  Multiplanar CT image reconstructions including MIPs were obtained to evaluate the vascular anatomy. Multidetector CT imaging of the abdomen and pelvis was performed using the standard protocol during bolus administration of intravenous contrast.  Contrast: 80mL OMNIPAQUE IOHEXOL 350 MG/ML SOLN,  Comparison:   None.  CTA CHEST  Findings: No filling defects in the pulmonary arteries to suggest pulmonary emboli.  Moderate left and large right pleural effusions. Compressive atelectasis in the lower lobes.  No visible pulmonary nodules.  Heart is normal size.  Aorta is normal caliber. No mediastinal, hilar, or axillary adenopathy.  Visualized thyroid and chest wall soft tissues unremarkable.    IMPRESSION:  .Moderate left and large right pleural effusions.  Compressive atelectasis in the lower lobes.  CT ABDOMEN AND PELVIS  Findings: No focal lesion in the liver.  Stomach is mildly distended but otherwise grossly unremarkable.  Spleen, pancreas, adrenals and kidneys are unremarkable.  No hydronephrosis.  Small amount of free fluid adjacent to the liver and in the paracolic gutters and pelvis.  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  No evidence of bowel obstruction.  Foley catheter in place with the bladder decompressed.  Aorta and iliac vessels are heavily calcified, non-aneurysmal.  No free air.  No adenopathy.  No acute bony abnormality.   Fusion across the SI joints. Degenerative changes in the lumbar spine.  Review of the MIP images confirms the above findings.  IMPRESSION: Prior subtotal colectomy.  Small amount of free fluid in the abdomen and pelvis.   Original Report Authenticated By: Charlett Nose, M.D.    Mr [facility] Wo Contrast  07/04/2012  *RADIOLOGY REPORT*  Clinical Data:  63 year old male with slurred speech, difficulty moving right shoulder.  Recent left lower extremity amputation.  Comparison:  Head CT 07/01/2012.  MRI HEAD WITHOUT CONTRAST MRI CERVICAL SPINE WITHOUT CONTRAST  Technique:  Multiplanar, multiecho pulse sequences of the brain and surrounding structures, and cervical spine, to include the craniocervical junction and cervicothoracic junction, were obtained without intravenous contrast.  MRI HEAD  Findings:  Punctate cortical focus of restricted diffusion in the left parietal lobe, posterior to the sensory strip (series 5 image 23).  No associated T2 or FLAIR hyperintensity.  Possible additional punctate area of restriction in the left occipital lobe white matter (image 13).  There is a small focus of restricted diffusion along the anterior right cerebellar tonsil (series 5 image 5).  There are to other punctate areas of restricted diffusion in the right cerebellum, one involving the right middle cerebellar peduncle (series 5 image 8). Probably also one additional punctate area of restriction in the dorsal medulla also on this image.  Minimal associated T2 and FLAIR hyperintensity.  Diffusion elsewhere is within normal limits. No acute intracranial hemorrhage identified.  No midline shift, mass effect, or evidence of mass lesion.  No ventriculomegaly. Major intracranial vascular flow voids are preserved, MRA findings are below.  Outside of the above findings, there is only mild for age scattered cerebral white matter T2 and FLAIR hyperintensity, mostly subcortical.  Negative pituitary cervicomedullary junction. Cervical  spine findings are below.  Visualized orbit soft tissues are within normal limits.  Trace mastoid effusions.  Negative visualized nasopharynx. Mild maxillary and  ethmoid sinus mucosal thickening.  Negative scalp soft tissues.  IMPRESSION: 1. 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  Two punctate acute infarcts suspected also in the left occipital and parietal lobe. No mass effect or hemorrhage. Given the distribution, embolic phenomena to the posterior circulation is possible.  The supratentorial findings might also reflect synchronous small vessel disease. 2.  No other acute intracranial abnormality and otherwise largely unremarkable for age MRI appearance of the brain.  3.  Cervical spine and MRA findings are below.  MRI CERVICAL SPINE  Findings: Preserved cervical lordosis. No marrow edema or evidence of acute osseous abnormality.  Subcutaneous edema in the posterior paraspinal soft tissues, possible mild involvement of the superficial aspect of the underlying erector spinae muscles.  This may be related to prolonged bedrest, the conditioning. Other Visualized paraspinal soft tissues are within normal limits.  Cervicomedullary junction is within normal limits.  Despite spinal stenosis, described below, no cervical spinal cord signal abnormality.  C2-C3:  Negative.  C3-C4:  Mild circumferential disc bulge.  Mild uncovertebral hypertrophy.  Mild bilateral C4 foraminal stenosis.  C4-C5:  Circumferential disc osteophyte complex.  Broad-based central right paracentral posterior component of disc.  Spinal stenosis with mild spinal cord flattening.  Mild left C5 foraminal stenosis.  C5-C6:  Circumferential disc osteophyte complex.  Moderate ligament flavum hypertrophy.  Broad-based posterior component of disc. Spinal stenosis with mild spinal cord flattening.  Mild to moderate bilateral C6 foraminal stenosis.  C6-C7:  Circumferential disc osteophyte complex.  Broad-based left eccentric  posterior component of disc.  Moderate ligament flavum hypertrophy.  Spinal stenosis with minimal spinal cord mass effect. Moderate to severe left and mild right C7 foraminal stenosis.  C7-T1:  Mild facet hypertrophy.  Mild left greater than right C8 foraminal stenosis.  Negative visualized upper thoracic levels.  IMPRESSION: 1.  Multifactorial cervical spinal stenosis with mild spinal cord mass effect from C4-C5 to C6-C7.  No spinal cord signal abnormality. 2.  Moderate to severe left C7 foraminal stenosis related to disc and endplate changes. Mild to moderate multifactorial bilateral C6 foraminal stenosis. 3.  MRA findings are below.  MRA HEAD WITHOUT CONTRAST  Technique:  Angiographic images of the Circle of Willis were obtained using MRA technique without intravenous contrast.  Findings:  Antegrade flow in the posterior circulation.  Codominant distal vertebral arteries.  Normal right PICA.  Normal vertebrobasilar junction.  Prominent right AICA also noted.  No left PICA or AICA origin identified. Subsequently, the left SCA might be dominant.  No basilar stenosis.  SCA and PCA origins are normal.  Diminutive posterior communicating arteries.  Bilateral PCA branches are within normal limits.  Antegrade flow in both ICA siphons.  Mild supraclinoid ICA irregularity.  No focal ICA stenosis.  Patent carotid termini.  MCA and ACA origins are within normal limits.  Diminutive or absent anterior communicating artery.  Visualized ACA branches are within normal limits.  Visualized bilateral MCA branches are within normal limits.  IMPRESSION: Negative intracranial MRA.   Original Report Authenticated By: Erskine Speed, M.D.    Mr Brain Wo Contrast  07/04/2012  *RADIOLOGY REPORT*  Clinical Data:  62 year old male with slurred speech, difficulty moving right shoulder.  Recent left lower extremity amputation.  Comparison:  Head CT 07/01/2012.  MRI HEAD WITHOUT CONTRAST MRI CERVICAL SPINE WITHOUT CONTRAST  Technique:   Multiplanar, multiecho pulse sequences of the brain and surrounding structures, and cervical spine, to include the craniocervical junction and cervicothoracic junction, were  obtained without intravenous contrast.  MRI HEAD  Findings:  Punctate cortical focus of restricted diffusion in the left parietal lobe, posterior to the sensory strip (series 5 image 23).  No associated T2 or FLAIR hyperintensity.  Possible additional punctate area of restriction in the left occipital lobe white matter (image 13).  There is a small focus of restricted diffusion along the anterior right cerebellar tonsil (series 5 image 5).  There are to other punctate areas of restricted diffusion in the right cerebellum, one involving the right middle cerebellar peduncle (series 5 image 8). Probably also one additional punctate area of restriction in the dorsal medulla also on this image.  Minimal associated T2 and FLAIR hyperintensity.  Diffusion elsewhere is within normal limits. No acute intracranial hemorrhage identified.  No midline shift, mass effect, or evidence of mass lesion.  No ventriculomegaly. Major intracranial vascular flow voids are preserved, MRA findings are below.  Outside of the above findings, there is only mild for age scattered cerebral white matter T2 and FLAIR hyperintensity, mostly subcortical.  Negative pituitary cervicomedullary junction. Cervical spine findings are below.  Visualized orbit soft tissues are within normal limits.  Trace mastoid effusions.  Negative visualized nasopharynx. Mild maxillary and ethmoid sinus mucosal thickening.  Negative scalp soft tissues.  IMPRESSION: 1. 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  Two punctate acute infarcts suspected also in the left occipital and parietal lobe. No mass effect or hemorrhage. Given the distribution, embolic phenomena to the posterior circulation is possible.  The supratentorial findings might also reflect synchronous small  vessel disease. 2.  No other acute intracranial abnormality and otherwise largely unremarkable for age MRI appearance of the brain.  3.  Cervical spine and MRA findings are below.  MRI CERVICAL SPINE  Findings: Preserved cervical lordosis. No marrow edema or evidence of acute osseous abnormality.  Subcutaneous edema in the posterior paraspinal soft tissues, possible mild involvement of the superficial aspect of the underlying erector spinae muscles.  This may be related to prolonged bedrest, the conditioning. Other Visualized paraspinal soft tissues are within normal limits.  Cervicomedullary junction is within normal limits.  Despite spinal stenosis, described below, no cervical spinal cord signal abnormality.  C2-C3:  Negative.  C3-C4:  Mild circumferential disc bulge.  Mild uncovertebral hypertrophy.  Mild bilateral C4 foraminal stenosis.  C4-C5:  Circumferential disc osteophyte complex.  Broad-based central right paracentral posterior component of disc.  Spinal stenosis with mild spinal cord flattening.  Mild left C5 foraminal stenosis.  C5-C6:  Circumferential disc osteophyte complex.  Moderate ligament flavum hypertrophy.  Broad-based posterior component of disc. Spinal stenosis with mild spinal cord flattening.  Mild to moderate bilateral C6 foraminal stenosis.  C6-C7:  Circumferential disc osteophyte complex.  Broad-based left eccentric posterior component of disc.  Moderate ligament flavum hypertrophy.  Spinal stenosis with minimal spinal cord mass effect. Moderate to severe left and mild right C7 foraminal stenosis.  C7-T1:  Mild facet hypertrophy.  Mild left greater than right C8 foraminal stenosis.  Negative visualized upper thoracic levels.  IMPRESSION: 1.  Multifactorial cervical spinal stenosis with mild spinal cord mass effect from C4-C5 to C6-C7.  No spinal cord signal abnormality. 2.  Moderate to severe left C7 foraminal stenosis related to disc and endplate changes. Mild to moderate multifactorial  bilateral C6 foraminal stenosis. 3.  MRA findings are below.  MRA HEAD WITHOUT CONTRAST  Technique:  Angiographic images of the Circle of Willis were obtained using MRA technique without intravenous  contrast.  Findings:  Antegrade flow in the posterior circulation.  Codominant distal vertebral arteries.  Normal right PICA.  Normal vertebrobasilar junction.  Prominent right AICA also noted.  No left PICA or AICA origin identified. Subsequently, the left SCA might be dominant.  No basilar stenosis.  SCA and PCA origins are normal.  Diminutive posterior communicating arteries.  Bilateral PCA branches are within normal limits.  Antegrade flow in both ICA siphons.  Mild supraclinoid ICA irregularity.  No focal ICA stenosis.  Patent carotid termini.  MCA and ACA origins are within normal limits.  Diminutive or absent anterior communicating artery.  Visualized ACA branches are within normal limits.  Visualized bilateral MCA branches are within normal limits.  IMPRESSION: Negative intracranial MRA.   Original Report Authenticated By: Erskine Speed, M.D.    Mr Cervical Spine Wo Contrast  07/04/2012  *RADIOLOGY REPORT*  Clinical Data:  62 year old male with slurred speech, difficulty moving right shoulder.  Recent left lower extremity amputation.  Comparison:  Head CT 07/01/2012.  MRI HEAD WITHOUT CONTRAST MRI CERVICAL SPINE WITHOUT CONTRAST  Technique:  Multiplanar, multiecho pulse sequences of the brain and surrounding structures, and cervical spine, to include the craniocervical junction and cervicothoracic junction, were obtained without intravenous contrast.  MRI HEAD  Findings:  Punctate cortical focus of restricted diffusion in the left parietal lobe, posterior to the sensory strip (series 5 image 23).  No associated T2 or FLAIR hyperintensity.  Possible additional punctate area of restriction in the left occipital lobe white matter (image 13).  There is a small focus of restricted diffusion along the anterior right  cerebellar tonsil (series 5 image 5).  There are to other punctate areas of restricted diffusion in the right cerebellum, one involving the right middle cerebellar peduncle (series 5 image 8). Probably also one additional punctate area of restriction in the dorsal medulla also on this image.  Minimal associated T2 and FLAIR hyperintensity.  Diffusion elsewhere is within normal limits. No acute intracranial hemorrhage identified.  No midline shift, mass effect, or evidence of mass lesion.  No ventriculomegaly. Major intracranial vascular flow voids are preserved, MRA findings are below.  Outside of the above findings, there is only mild for age scattered cerebral white matter T2 and FLAIR hyperintensity, mostly subcortical.  Negative pituitary cervicomedullary junction. Cervical spine findings are below.  Visualized orbit soft tissues are within normal limits.  Trace mastoid effusions.  Negative visualized nasopharynx. Mild maxillary and ethmoid sinus mucosal thickening.  Negative scalp soft tissues.  IMPRESSION: 1. 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  Two punctate acute infarcts suspected also in the left occipital and parietal lobe. No mass effect or hemorrhage. Given the distribution, embolic phenomena to the posterior circulation is possible.  The supratentorial findings might also reflect synchronous small vessel disease. 2.  No other acute intracranial abnormality and otherwise largely unremarkable for age MRI appearance of the brain.  3.  Cervical spine and MRA findings are below.  MRI CERVICAL SPINE  Findings: Preserved cervical lordosis. No marrow edema or evidence of acute osseous abnormality.  Subcutaneous edema in the posterior paraspinal soft tissues, possible mild involvement of the superficial aspect of the underlying erector spinae muscles.  This may be related to prolonged bedrest, the conditioning. Other Visualized paraspinal soft tissues are within normal limits.   Cervicomedullary junction is within normal limits.  Despite spinal stenosis, described below, no cervical spinal cord signal abnormality.  C2-C3:  Negative.  C3-C4:  Mild circumferential  disc bulge.  Mild uncovertebral hypertrophy.  Mild bilateral C4 foraminal stenosis.  C4-C5:  Circumferential disc osteophyte complex.  Broad-based central right paracentral posterior component of disc.  Spinal stenosis with mild spinal cord flattening.  Mild left C5 foraminal stenosis.  C5-C6:  Circumferential disc osteophyte complex.  Moderate ligament flavum hypertrophy.  Broad-based posterior component of disc. Spinal stenosis with mild spinal cord flattening.  Mild to moderate bilateral C6 foraminal stenosis.  C6-C7:  Circumferential disc osteophyte complex.  Broad-based left eccentric posterior component of disc.  Moderate ligament flavum hypertrophy.  Spinal stenosis with minimal spinal cord mass effect. Moderate to severe left and mild right C7 foraminal stenosis.  C7-T1:  Mild facet hypertrophy.  Mild left greater than right C8 foraminal stenosis.  Negative visualized upper thoracic levels.  IMPRESSION: 1.  Multifactorial cervical spinal stenosis with mild spinal cord mass effect from C4-C5 to C6-C7.  No spinal cord signal abnormality. 2.  Moderate to severe left C7 foraminal stenosis related to disc and endplate changes. Mild to moderate multifactorial bilateral C6 foraminal stenosis. 3.  MRA findings are below.  MRA HEAD WITHOUT CONTRAST  Technique:  Angiographic images of the Circle of Willis were obtained using MRA technique without intravenous contrast.  Findings:  Antegrade flow in the posterior circulation.  Codominant distal vertebral arteries.  Normal right PICA.  Normal vertebrobasilar junction.  Prominent right AICA also noted.  No left PICA or AICA origin identified. Subsequently, the left SCA might be dominant.  No basilar stenosis.  SCA and PCA origins are normal.  Diminutive posterior communicating arteries.   Bilateral PCA branches are within normal limits.  Antegrade flow in both ICA siphons.  Mild supraclinoid ICA irregularity.  No focal ICA stenosis.  Patent carotid termini.  MCA and ACA origins are within normal limits.  Diminutive or absent anterior communicating artery.  Visualized ACA branches are within normal limits.  Visualized bilateral MCA branches are within normal limits.  IMPRESSION: Negative intracranial MRA.   Original Report Authenticated By: Erskine Speed, M.D.    Ct Abdomen Pelvis W Contrast  07/08/2012  *RADIOLOGY REPORT*  Clinical Data:   Bilateral upper extremity swelling.  History of colon cancer.  CT ANGIOGRAPHY CHEST CT ABDOMEN AND PELVIS WITH CONTRAST  Technique:  Multidetector CT imaging of the chest was performed using the standard protocol during bolus administration of intravenous contrast.  Multiplanar CT image reconstructions including MIPs were obtained to evaluate the vascular anatomy. Multidetector CT imaging of the abdomen and pelvis was performed using the standard protocol during bolus administration of intravenous contrast.  Contrast: 80mL OMNIPAQUE IOHEXOL 350 MG/ML SOLN,  Comparison:   None.  CTA CHEST  Findings: No filling defects in the pulmonary arteries to suggest pulmonary emboli.  Moderate left and large right pleural effusions. Compressive atelectasis in the lower lobes.  No visible pulmonary nodules.  Heart is normal size.  Aorta is normal caliber. No mediastinal, hilar, or axillary adenopathy.  Visualized thyroid and chest wall soft tissues unremarkable.    IMPRESSION:  .Moderate left and large right pleural effusions.  Compressive atelectasis in the lower lobes.  CT ABDOMEN AND PELVIS  Findings: No focal lesion in the liver.  Stomach is mildly distended but otherwise grossly unremarkable.  Spleen, pancreas, adrenals and kidneys are unremarkable.  No hydronephrosis.  Small amount of free fluid adjacent to the liver and in the paracolic gutters and pelvis.  Postoperative  changes from subtotal colectomy.  No evidence of bowel obstruction.  Foley catheter in place  with the bladder decompressed.  Aorta and iliac vessels are heavily calcified, non-aneurysmal.  No free air.  No adenopathy.  No acute bony abnormality.  Fusion across the SI joints. Degenerative changes in the lumbar spine.  Review of the MIP images confirms the above findings.  IMPRESSION: Prior subtotal colectomy.  Small amount of free fluid in the abdomen and pelvis.   Original Report Authenticated By: Charlett Nose, M.D.    Dg Chest Port 1 View  07/07/2012  *RADIOLOGY REPORT*  Clinical Data: Line placement  PORTABLE CHEST - 1 VIEW  Comparison: Chest radiograph 07/02/2012  Findings: Interval placement of a right PICC line with the tip in the distal SVC / cavoatrial junction.  Right central venous line is unchanged.  Interval extubation.  Stable cardiac silhouette.  Lungs are clear.  IMPRESSION: Interval placement of right PICC line with tip at the cavoatrial junction.   Original Report Authenticated By: Genevive Bi, M.D.    Dg Chest Port 1 View  07/02/2012  *RADIOLOGY REPORT*  Clinical Data: Advancement of endotracheal tube  PORTABLE CHEST - 1 VIEW  Comparison: Portable exam 0626 hours compared to 07/02/2012 at 0514 hours  Findings: Tip of endotracheal tube now 2.0 cm above carina. Nasogastric tube and right jugular line unchanged. Stable heart size. Persistent atelectasis versus consolidation left lower lobe. Mild right basilar atelectasis. Upper lungs clear.  IMPRESSION: Tip of endotracheal tube now 2.0 cm above carina. Remainder exam unchanged.   Original Report Authenticated By: Ulyses Southward, M.D.    Dg Chest Port 1 View  07/02/2012  *RADIOLOGY REPORT*  Clinical Data: Follow up atelectasis  PORTABLE CHEST - 1 VIEW  Comparison: Portable exam 0514 hours compared to 07/01/2012  Findings: Tip of endotracheal tube in cervical region, 11.8 cm above carina. Right jugular central venous catheter tip projecting over  SVC. Nasogastric tube extends into stomach. Numerous cardiac monitoring leads project over chest. Upper-normal size of cardiac silhouette. Mediastinal contours and pulmonary vascularity normal. Minimal right basilar atelectasis. Slightly increased atelectasis versus consolidation in medial left lower lobe. No definite pleural effusion or pneumothorax.  IMPRESSION: Minimal right basilar atelectasis with increased atelectasis versus consolidation in left lower lobe. High position of endotracheal tube 11.8 cm above carina; may consider advancing tube 6 cm for more stable position within thoracic trachea.   Original Report Authenticated By: Ulyses Southward, M.D.    Dg Chest Port 1 View  07/01/2012  *RADIOLOGY REPORT*  Clinical Data: Acute respiratory failure on ventilator.  Colon carcinoma.  PORTABLE CHEST - 1 VIEW  Comparison: 06/30/2012  Findings: Endotracheal tube remains high in position with the tip approximately 11 cm above the carina.  Both lungs are clear.  Heart size is normal.  Right jugular center venous catheter remains in appropriate position.  IMPRESSION:  1.  High endotracheal tube position, with tip approximately 11 cm above carina. 2.  No active lung disease.   Original Report Authenticated By: Myles Rosenthal, M.D.    Dg Chest Port 1 View  06/30/2012  *RADIOLOGY REPORT*  Clinical Data: Endotracheal placement  PORTABLE CHEST - 1 VIEW  Comparison: Same day  Findings: Endotracheal tip is 9 cm above the carina, at the thoracic inlet.  Right internal jugular central line is in the SVC at the azygos level.  Minimal atelectasis again noted in the left lower lobe.  No other change.  IMPRESSION: Endotracheal tube somewhat high, 9 cm above the carina at the thoracic inlet.   Original Report Authenticated By: Paulina Fusi, M.D.  US Thoracentesis Asp Pleural Space W/img Guide  07/09/2012  *RADIOLOGY REPORT*  Clinical Data:  New onset bilateral pleural effusions.  History of colon cancer.  Request has been made for  therapeutic and diagnostic thoracentesis.  ULTRASOUND GUIDED right THORACENTESIS  Comparison:  Prior chest x-ray and CT scan of the chest.  An ultrasound guided thoracentesis was thoroughly discussed with the patient and questions answered.  The benefits, risks, alternatives and complications were also discussed.  The patient understands and wishes to proceed with the procedure.  Written consent was obtained.  Ultrasound was performed to localize and mark an adequate pocket of fluid in the right chest.  The area was then prepped and draped in the normal sterile fashion.  1% Lidocaine was used for local anesthesia.  Under ultrasound guidance a 19 gauge Yueh catheter was introduced.  Thoracentesis was performed.  The catheter was removed and a dressing applied.  Complications:  None immediate  Findings: A total of approximately 210 ml of amber-colored fluid was removed. A fluid sample was sent for laboratory analysis.  IMPRESSION: Successful ultrasound guided right thoracentesis yielding 210 ml of pleural fluid.  Read by: Anselm Pancoast, P.A.-C   Original Report Authenticated By: Judie Petit. Miles Costain, M.D.     Microbiology: Recent Results (from the past 240 hour(s))  MRSA PCR SCREENING     Status: Normal   Collection Time   07/29/12  6:01 AM      Component Value Range Status Comment   MRSA by PCR NEGATIVE  NEGATIVE Final      Labs: Basic Metabolic Panel:  Lab 07/30/12 4540 07/29/12 0530 07/28/12 1645  NA 135 137 134*  K 4.2 3.9 4.3  CL 101 101 99  CO2 29 29 27   GLUCOSE 120* 74 152*  BUN 17 16 19   CREATININE 1.05 0.94 1.22  CALCIUM 7.9* 7.8* 7.8*  MG -- 1.9 --  PHOS -- -- --   Liver Function Tests:  Lab 07/28/12 1645  AST 27  ALT 30  ALKPHOS 158*  BILITOT 0.2*  PROT 6.5  ALBUMIN 1.7*   No results found for this basename: LIPASE:5,AMYLASE:5 in the last 168 hours No results found for this basename: AMMONIA:5 in the last 168 hours CBC:  Lab 07/30/12 0358 07/29/12 0530 07/28/12 1645  WBC 9.2  7.9 9.8  NEUTROABS -- -- 7.7  HGB 9.7* 8.4* 7.4*  HCT 30.5* 26.2* 23.3*  MCV 82.4 83.2 84.4  PLT 300 314 345   Cardiac Enzymes: No results found for this basename: CKTOTAL:5,CKMB:5,CKMBINDEX:5,TROPONINI:5 in the last 168 hours BNP: BNP (last 3 results) No results found for this basename: PROBNP:3 in the last 8760 hours CBG:  Lab 07/30/12 0812 07/30/12 0722 07/29/12 2203 07/29/12 1634 07/29/12 1217  GLUCAP 97 69* 147* 196* 145*       Signed:  THOMPSON,DANIEL  [facility] 07/30/2012, 10:06 AM

## 2012-07-30 NOTE — Progress Notes (Signed)
Pt to be d/c today to [facility].   Pt and family agreeable. Confirmed plans with facility.  Plan transfer via EMS.   Jeyden Coffelt, LCSWA [facility] Coverage [phone]   

## 2012-07-30 NOTE — Evaluation (Signed)
Occupational Therapy Evaluation Patient Details Name: Tyler Erickson MRN: [id number] DOB: 1950-10-10 Today's Date: 07/30/2012 Time: 4098-1191 OT Time Calculation (min): 31 min  OT Assessment / Plan / Recommendation Clinical Impression  This 62 yo male admitted with anemia (PMHx of recent L BKA, CVA, and colon CA) presents to acute OT with problems below. WIll benefit from acute OT with follow up OT at SNF.    OT Assessment  Patient needs continued OT Services    Follow Up Recommendations  SNF    Barriers to Discharge Decreased caregiver support    Equipment Recommendations  None recommended by OT       Frequency  Min 2X/week    Precautions / Restrictions Precautions Precautions: Fall Precaution Comments: RUE edema with noted area of scabbing?.  Recent BKA on LLE Required Braces or Orthoses: Other Brace/Splint Other Brace/Splint: LLE post op hard shell shrinker on BKA Restrictions Weight Bearing Restrictions: No   Pertinent Vitals/Pain Bil UEs (not rated)    ADL  Eating/Feeding: Supervision/safety;Set up Where Assessed - Eating/Feeding: Chair Grooming: Set up;Supervision/safety Where Assessed - Grooming: Unsupported sitting Upper Body Bathing: Simulated;Moderate assistance Where Assessed - Upper Body Bathing: Unsupported sitting Lower Body Bathing: Simulated;Minimal assistance Where Assessed - Lower Body Bathing: Lean right and/or left;Unsupported sitting;Supported sit to stand Upper Body Dressing: Simulated;Moderate assistance Where Assessed - Upper Body Dressing: Unsupported sitting Lower Body Dressing: Performed;Moderate assistance Where Assessed - Lower Body Dressing: Lean right and/or left;Unsupported sitting;Supported sit to stand Toilet Transfer: Performed;Minimal assistance Toilet Transfer Method: Ambulance person: Materials engineer and Hygiene: Performed;Min guard Where Assessed - Medical sales representative and Hygiene: Lean right and/or left Equipment Used: Rolling walker (would be better with platform on right) Transfers/Ambulation Related to ADLs: Min A sit to stand and stand to sit as well as squat pivot    OT Diagnosis: Generalized weakness  OT Problem List: Decreased strength;Decreased range of motion;Impaired UE functional use;Increased edema;Impaired balance (sitting and/or standing);Impaired sensation OT Treatment Interventions: Therapeutic exercise;Therapeutic activities;Patient/family education   OT Goals Acute Rehab OT Goals OT Goal Formulation: With patient Time For Goal Achievement: 08/06/12 Potential to Achieve Goals: Good Arm Goals Additional Arm Goal #1: Pt will be min A with RUE exercises activiites.  Visit Information  Last OT Received On: 07/30/12 Assistance Needed: +1 (+2 if hopping)) PT/OT Co-Evaluation/Treatment: Yes    Subjective Data  Subjective: I am ready to get back to rehab   Prior Functioning     Home Living Available Help at Discharge: Skilled Nursing Facility Type of Home: Skilled Nursing Facility Additional Comments: Pt at [facility] prior to this admission and states that he was working with therapy on UE strengthening, standing, and able to hop.  He was also self propelling w/c and able to negotiate in/out of restroom.  Prior Function Level of Independence: Needs assistance Needs Assistance: Dressing Dressing: Minimal Able to Take Stairs?: No Driving: No Vocation: On disability Communication Communication: No difficulties            Cognition  Overall Cognitive Status: Appears within functional limits for tasks assessed/performed Arousal/Alertness: Awake/alert Orientation Level: Appears intact for tasks assessed Behavior During Session: Agitated Cognition - Other Comments: Pt somewhat agitated when asked about RUE wound/swelling.  Also states that he takes his time when performing ADL's.     Extremity/Trunk  Assessment Right Upper Extremity Assessment RUE ROM/Strength/Tone: Deficits RUE ROM/Strength/Tone Deficits: AArom right shoulder to about 45 flexion, elbow flexion to 90 degrees,  no supination past neutral, no AROM at wrist or digit extention, minimal digit flexion (1+/5), elbow extension from 90 degrees flexion is full AROM, full AROM pronation. RUE Sensation: Deficits RUE Sensation Deficits: light touch RUE Coordination: Deficits RUE Coordination Deficits: FM and GM Left Upper Extremity Assessment LUE ROM/Strength/Tone: Deficits LUE Sensation: History of peripheral neuropathy LUE Coordination: Deficits LUE Coordination Deficits: FM and GM, per pts report he has just recently started being able to feed himself with LUE with utensils built up with red tubing Right Lower Extremity Assessment RLE ROM/Strength/Tone: [facility] for tasks assessed RLE ROM/Strength/Tone Deficits: Grossly 3+/5 per functional tasks.  Left Lower Extremity Assessment LLE ROM/Strength/Tone: Unable to fully assess;Deficits LLE ROM/Strength/Tone Deficits: Pt with recent BKA, able to move residual limb well when getting to EOB.  Able to perform knee ext with 3/5 strength, however needs some assist for SLR.  LLE Sensation: History of peripheral neuropathy     Mobility Bed Mobility Bed Mobility: Supine to Sit;Sitting - Scoot to Edge of Bed Supine to Sit: 5: Supervision;HOB elevated;With rails Sitting - Scoot to Edge of Bed: 5: Supervision Details for Bed Mobility Assistance: Supervision for safety.  Pt demos good use of LUE to self assist to EOB.   Transfers Sit to Stand: 4: Min assist;From elevated surface;With upper extremity assist;From bed Stand to Sit: 4: Min assist;With upper extremity assist;With armrests;To chair/3-in-1 Details for Transfer Assistance: Assist to rise and steady with cues for hand placement and safety. Performed x 2 to stand from 3in1.   Able to pivot on RLE to 3in1 with min assist and cues for hand  placement/technique.             Balance Balance Balance Assessed: Yes Static Sitting Balance Static Sitting - Balance Support: Bilateral upper extremity supported (right foot supported. ) Static Sitting - Level of Assistance: 7: Independent Static Standing Balance Static Standing - Balance Support: Bilateral upper extremity supported Static Standing - Level of Assistance: 4: Min assist Static Standing - Comment/# of Minutes: Pt able to stand at RW at min/guard to min assist level.    End of Session OT - End of Session Equipment Utilized During Treatment: Gait belt (RW) Activity Tolerance: Patient tolerated treatment well Patient left: in chair;with call bell/phone within reach;with family/visitor present Nurse Communication: Mobility status (NT; squat pivot back to bed)    Evette Georges [phone] 07/30/2012, 10:54 AM

## 2012-07-30 NOTE — Progress Notes (Signed)
Clinical Social Work Department BRIEF PSYCHOSOCIAL ASSESSMENT 07/30/2012  Patient:  Tyler Erickson, Tyler Erickson     Account Number:  [id number]     Admit date:  07/28/2012  Clinical Social Worker:  Leron Croak, CLINICAL SOCIAL WORKER  Date/Time:  07/30/2012 11:00 AM  Referred by:  Physician  Date Referred:  07/29/2012 Referred for  SNF Placement   Other Referral:   Interview type:  Patient Other interview type:   Wife also in the room    PSYCHOSOCIAL DATA Living Status:  FACILITY Admitted from facility:  [facility], [place] Level of care:  Skilled Nursing Facility Primary support name:   Primary support relationship to patient:  SPOUSE Degree of support available:   Pt has good support from family and facility    CURRENT CONCERNS Current Concerns  Post-Acute Placement   Other Concerns:    SOCIAL WORK ASSESSMENT / PLAN CSW met with wife and Pt at the bedside. Pt is anticipating returning to faciltiy today. CSW informed Pt that facility is willing to take Pt back today. Pt is agreeable to this plan.    Pt information has been faxed to facility.    EMS has been notified of transport.    Nurse has been notified.   Assessment/plan status:  Information/Referral to [facility] Other assessment/ plan:   Information/referral to community resources:   No handout needed at this time.    PATIENT'S/FAMILY'S RESPONSE TO PLAN OF CARE: Pt and wife appreciative for assistance returning to facility.        Leron Croak, LCSWA [employer] Coverage [phone]

## 2012-08-02 ENCOUNTER — Encounter: Payer: Self-pay | Admitting: Vascular Surgery

## 2012-08-03 ENCOUNTER — Ambulatory Visit (INDEPENDENT_AMBULATORY_CARE_PROVIDER_SITE_OTHER): Payer: Medicare HMO | Admitting: Vascular Surgery

## 2012-08-03 ENCOUNTER — Encounter: Payer: Self-pay | Admitting: Vascular Surgery

## 2012-08-03 VITALS — BP 120/59 | HR 88 | Ht 66.5 in | Wt 142.0 lb

## 2012-08-03 DIAGNOSIS — R209 Unspecified disturbances of skin sensation: Secondary | ICD-10-CM

## 2012-08-03 DIAGNOSIS — I739 Peripheral vascular disease, unspecified: Secondary | ICD-10-CM

## 2012-08-03 DIAGNOSIS — I70269 Atherosclerosis of native arteries of extremities with gangrene, unspecified extremity: Secondary | ICD-10-CM | POA: Insufficient documentation

## 2012-08-03 DIAGNOSIS — Z0279 Encounter for issue of other medical certificate: Secondary | ICD-10-CM

## 2012-08-03 DIAGNOSIS — R202 Paresthesia of skin: Secondary | ICD-10-CM

## 2012-08-03 NOTE — Progress Notes (Addendum)
Patient is a 62 year old male who returns today for followup after a left below-knee amputation approximately one month ago. At that time he also had a right upper extremity arteriogram for poor function in his right arm. He was seen by several services including infectious disease, orthopedics, neurology, and critical care while in the hospital. [facility] is [facility]. He still has no function in his right hand but is able to move his right arm some. He is overall still very deconditioned but feels he is improving.  Physical exam: Filed Vitals:   08/03/12 0839  BP: 120/59  Pulse: 88  Height: 5' 6.5" (1.689 m)  Weight: 142 lb (64.411 kg)  SpO2: 100%   Right upper extremity: Hand is pink warm and viable there is no flexion or extension of the wrist or hand intrinsics he does have some ability to flex and extend the elbow and abduct and adduct.the shoulder  Lower extremities: Left below-knee amputation healing well staples were removed today, right foot 3 cm ulceration of her right fifth metatarsal head 2 cm ulceration tip of right first toe. Both of these appear to be healing currently. No palpable pedal pulses right foot  Assessment: Healing left below-knee amputation. Dry ulcers right foot with marginal right leg perfusion  Plan: The patient was given a prescription today for a shrinker for the left below-knee dictation as well as a prescription for evaluation by [employer] for prosthetic. The patient will followup in one month to recheck his right foot. If the right foot continues to deteriorate we will need to consider an arteriogram.  The patient has followup scheduled with infectious disease who will decide when to remove his right upper extremity PICC line.  Our office scheduled him followup with Dr. Pearlean Brownie today for followup and possible bubble study based on Dr. Marlis Edelson discharge note  Fabienne Bruns, MD [facility] Office:  [phone] Pager: [phone]

## 2012-08-04 NOTE — Addendum Note (Signed)
Addended by: Sharee Pimple on: 08/04/2012 01:44 PM   Modules accepted: Orders

## 2012-08-29 ENCOUNTER — Encounter: Payer: Self-pay | Admitting: Internal Medicine

## 2012-08-29 ENCOUNTER — Ambulatory Visit (INDEPENDENT_AMBULATORY_CARE_PROVIDER_SITE_OTHER): Payer: Medicare HMO | Admitting: Internal Medicine

## 2012-08-29 VITALS — BP 125/85 | HR 99 | Temp 98.2°F

## 2012-08-29 DIAGNOSIS — R6521 Severe sepsis with septic shock: Secondary | ICD-10-CM

## 2012-08-29 DIAGNOSIS — R7881 Bacteremia: Secondary | ICD-10-CM

## 2012-08-29 DIAGNOSIS — S88119A Complete traumatic amputation at level between knee and ankle, unspecified lower leg, initial encounter: Secondary | ICD-10-CM

## 2012-08-29 DIAGNOSIS — B950 Streptococcus, group A, as the cause of diseases classified elsewhere: Secondary | ICD-10-CM

## 2012-08-29 DIAGNOSIS — E119 Type 2 diabetes mellitus without complications: Secondary | ICD-10-CM

## 2012-08-29 NOTE — Progress Notes (Signed)
Patient ID: Tyler Erickson, male   DOB: [birth year], 62 y.o.   MRN: [id number]         [facility] for Infectious Disease  Patient Active Problem List  Diagnosis  . ADENOCARCINOMA, COLON 2010  . DM  . NEUROPATHY  . Hyponatremia  . Hypokalemia  . Metabolic acidosis  . AKI (acute kidney injury)  . Leukocytosis  . Diabetic foot ulcer  . Gangrene of foot  . Decreased range of motion of shoulder  . Septic shock(785.52)  . Severe protein-calorie malnutrition  . Normocytic anemia  . S/P unilateral BKA (below knee amputation) left  . Weakness generalized  . Bacteremia due to Streptococcus  . CVA (cerebral infarction)  . Atherosclerosis of native arteries of the extremities with gangrene  . Peripheral vascular disease, unspecified    Patient's Medications  New Prescriptions   No medications on file  Previous Medications   ACETAMINOPHEN (TYLENOL) 325 MG TABLET    Take 2 tablets (650 mg total) by mouth every 6 (six) hours as needed for pain or fever.   ALBUTEROL (PROVENTIL) (5 MG/ML) 0.5% NEBULIZER SOLUTION    Take 0.5 mLs (2.5 mg total) by nebulization every 6 (six) hours as needed for wheezing or shortness of breath.   ASPIRIN 325 MG TABLET    Take 1 tablet (325 mg total) by mouth daily.   CHLORHEXIDINE (PERIDEX) 0.12 % SOLUTION    Use as directed 15 mLs in the mouth or throat 2 (two) times daily.   INSULIN ASPART (NOVOLOG) 100 UNIT/ML INJECTION    Inject 5 Units into the skin 3 (three) times daily before meals. If CBG above 150, give 5 units subcutaneously 15 minutes before or 30 minutes after a meal and at bedtime.   INSULIN GLARGINE (LANTUS) 100 UNIT/ML INJECTION    Inject 14 Units into the skin at bedtime.   MULTIPLE VITAMINS-MINERALS (DECUBI-VITE) CAPS    Take 1 capsule by mouth 2 (two) times daily.   ONDANSETRON (ZOFRAN) 4 MG/2ML SOLN INJECTION    Inject 2 mLs (4 mg total) into the vein every 6 (six) hours as needed for nausea.   OVER THE COUNTER MEDICATION    Take 90 mLs  by mouth 3 (three) times daily. 2  Cal supplement.   OXYCODONE (OXY IR/ROXICODONE) 5 MG IMMEDIATE RELEASE TABLET    Take 1-2 tablets (5-10 mg total) by mouth every 4 (four) hours as needed for pain.   SACCHAROMYCES BOULARDII (FLORASTOR) 250 MG CAPSULE    Take 1 capsule (250 mg total) by mouth 2 (two) times daily.  Modified Medications   No medications on file  Discontinued Medications   CHOLESTYRAMINE (QUESTRAN) 4 G PACKET    Take 1 packet (4 g total) by mouth 3 (three) times daily. Till 07/17/2012 then discontinue.    Subjective: Tyler Erickson is in for his routine visit. He is now been off of IV ceftriaxone for about one month. He completed one month of therapy for severe group A strep bacteremia complicated by probable septic emboli. He has not had any fever, chills or new sweats. His appetite is improving and is regaining some of his lost weight. He says his wounds are healing slowly.  Objective: Temp: 98.2 F (36.8 C) (03/04 1458) Temp src: Oral (03/04 1458) BP: 125/85 mmHg (03/04 1458) Pulse Rate: 99 (03/04 1458)  General: alert and in no distress sitting in wheelchair Skin: left and right arm wounds are improving Lungs: clear Cor: regular S1 and S2 no murmurs Extremities:  Left BKA stump dressed. Callus under her right fifth metatarsal head was trimmed yesterday. No signs of infection.  Assessment: I strongly suspect that his group A strep infection has been cured. He is due to start going to the Wound Center tomorrow for ongoing wound care.  Plan: 1. Continue observation off of antibiotics 2. Followup here as needed   Cliffton Asters, MD [facility] for Infectious Disease [facility] [phone] pager   [phone] cell 08/29/2012, 3:30 PM

## 2012-08-31 ENCOUNTER — Encounter (HOSPITAL_BASED_OUTPATIENT_CLINIC_OR_DEPARTMENT_OTHER): Payer: Medicare HMO | Attending: Internal Medicine

## 2012-08-31 ENCOUNTER — Encounter: Payer: Self-pay | Admitting: Cardiovascular Disease

## 2012-08-31 ENCOUNTER — Ambulatory Visit: Payer: Medicare HMO | Admitting: Vascular Surgery

## 2012-08-31 DIAGNOSIS — R29898 Other symptoms and signs involving the musculoskeletal system: Secondary | ICD-10-CM | POA: Insufficient documentation

## 2012-08-31 DIAGNOSIS — S51809A Unspecified open wound of unspecified forearm, initial encounter: Secondary | ICD-10-CM | POA: Insufficient documentation

## 2012-08-31 DIAGNOSIS — E119 Type 2 diabetes mellitus without complications: Secondary | ICD-10-CM | POA: Insufficient documentation

## 2012-08-31 DIAGNOSIS — X58XXXA Exposure to other specified factors, initial encounter: Secondary | ICD-10-CM | POA: Insufficient documentation

## 2012-08-31 DIAGNOSIS — C189 Malignant neoplasm of colon, unspecified: Secondary | ICD-10-CM | POA: Insufficient documentation

## 2012-08-31 DIAGNOSIS — S41109A Unspecified open wound of unspecified upper arm, initial encounter: Secondary | ICD-10-CM | POA: Insufficient documentation

## 2012-08-31 DIAGNOSIS — I739 Peripheral vascular disease, unspecified: Secondary | ICD-10-CM | POA: Insufficient documentation

## 2012-09-01 NOTE — Progress Notes (Signed)
[facility]  NAME:  Tyler Erickson, Tyler Erickson                ACCOUNT NO.:  [id number]  MEDICAL RECORD NO.:  [id number]      DATE OF BIRTH:  Mar 10, 1951  PHYSICIAN:  Maxwell Caul, M.D.      VISIT DATE:                                  OFFICE VISIT   Tyler Erickson is a 62 year old man who arrives for our review of wounds on his bilateral upper extremities.  He comes from [facility]  I must admit I had some difficulty exactly understanding the pathogenesis of these wounds.  He was admitted to the hospital in early January with gangrene of his left foot and sepsis.  He was treated with Rocephin I believe that is now stopped.  At some point, surrounding this, he developed weakness in the right arm.  The cause of this is also not completely clear at the time of dictation, although his sister who accompanies him said that he has received an extensive workup for this including an MRI of his head and C-spine and if I understand things, he is going for EMGs and nerve conduction studies next week by Dr. Pearlean Brownie. In any case, he was sent to [facility], admitted to hospital at a later date with increasing edema of his arms.  There was some consideration of compartment syndrome, although that was apparently ruled out.  He is continued to be followed by Infectious Disease, is not felt to have an active infection.  PAST MEDICAL HISTORY: 1. Carcinoma of the colon. 2. Diabetes. 3. Diabetic neuropathy. 4. Brachial neuritis. 5. Peripheral vascular disease. 6. Lymphedema. 7. History of chemotherapy. 8. Left BKA. 9. Colectomy in 2010.  PHYSICAL EXAMINATION:  VITAL SIGNS:  Temperature is 97.8, pulse 114, respirations 20, blood pressure 122/86.  Blood glucose is 87. WOUND EXAM:  The areas in question are on his bilateral upper extremities.  The area most concerning is on the right lateral arm. This is an extensive wound.  This was surgically debrided as much  as I can do.  There is exposed tendon here, at least 1 of which is extensor hallucis longus tendon.  There was serosanguineous drainage here which I have cultured.  There was no overt infection around the wound. Nevertheless this is still covered with a thick eschar that is going to need further debridement, perhaps in the OR ultimately.  There was a small wound over the right olecranon process.  There were 2 additional wounds on the left medial arm above the elbow and below the elbow.  Both of these were again covered with a large amount of necrotic eschar. This was again extensively surgically debrided with more success than on the other arm.  I think there may actually be healing surface here.  His peripheral pulses are palpable.  Neurologically in the right hand, he has some median nerve function, however, this is very limited.  He has no extension of his fingers or his wrist.  He does have fairly good flexion and extension of the elbow and his movement in the shoulder is better.  IMPRESSION: 1. Upper extremity wounds as described above.  All of these underwent     a deep surgical debridement.  The area on his right forearm may  actually ultimately need more debridement than I could possibly do     in this clinic.  There is already exposed tendon here.  I may have     Dr. Kelly Splinter look at this in the next week or two.  The areas on the     left arm both underwent debridement.  This may be an easier time in     terms of getting a healing surface.  All of these wounds will     require Santyl, pulse lavage if available and continue debridement.     We used Santyl covered with Vaseline gauze, foam, and a Kerlix and     net dressing to all of these wounds. 2. Question lower brachial plexus injury at some point, the exact     nature of this is not really clear.  The patient is very upset     about the loss of the use of the hand.  I am sure there is more     information currently  available than I was able to put together at     the time of this dictation.  I am also not exactly sure of the     pathogenesis of these wounds, whether these were felt to be     metastatic infection at the time of his original sepsis or there     are other issues that I am unaware, I cannot be certain.  Nevertheless all of these wounds were dressed as above.  Plastic Surgery to look at the area on his right arm is probably going to be necessary at some point.          ______________________________ Maxwell Caul, M.D.     MGR/MEDQ  D:  08/31/2012  T:  09/01/2012  Job:  [id number]

## 2012-09-11 ENCOUNTER — Telehealth: Payer: Self-pay | Admitting: Gastroenterology

## 2012-09-11 NOTE — Telephone Encounter (Signed)
Left message for pt to call back.  Christy with [facility] requested an order for Social work to come into the home. Explained to [place] that home health orders are usually given by the pts PCP. She will try and contact his PCP.

## 2012-09-13 ENCOUNTER — Encounter: Payer: Self-pay | Admitting: Vascular Surgery

## 2012-09-14 ENCOUNTER — Encounter: Payer: Self-pay | Admitting: Vascular Surgery

## 2012-09-14 ENCOUNTER — Encounter (INDEPENDENT_AMBULATORY_CARE_PROVIDER_SITE_OTHER): Payer: Medicare HMO | Admitting: Vascular Surgery

## 2012-09-14 ENCOUNTER — Ambulatory Visit (INDEPENDENT_AMBULATORY_CARE_PROVIDER_SITE_OTHER): Payer: Medicare HMO | Admitting: Vascular Surgery

## 2012-09-14 VITALS — BP 119/70 | HR 80 | Ht 66.5 in | Wt 142.0 lb

## 2012-09-14 DIAGNOSIS — I739 Peripheral vascular disease, unspecified: Secondary | ICD-10-CM

## 2012-09-14 NOTE — Progress Notes (Signed)
[facility] [place]  Patient is a 62 year old male who returns today for followup after a left below-knee amputation approximately 2 months ago. At that time he also had a right upper extremity arteriogram for poor function in his right arm. This showed fairly severe distal occlusive disease but his arm was viable.   He was seen by several services including infectious disease, orthopedics, neurology, and critical care while in the hospital.  He still has no function in his right hand but is able to move his right arm some. He is overall still very deconditioned but feels he is improving. At his last office visit he has several ulcerations on the right foot. These have now all completely healed. He did fall on his left below-knee amputation stump recently but there are no open wounds.  Physical exam:  Filed Vitals:   09/14/12 1524  BP: 119/70  Pulse: 80  Height: 5' 6.5" (1.689 m)  Weight: 142 lb (64.411 kg)  SpO2: 100%   Right upper extremity: Hand is pink warm and viable there is no flexion or extension of the wrist or hand intrinsics he does have some ability to flex and extend the elbow and abduct and adduct.the shoulder, he has wounds on both forearms and the dressings were not removed today these are being followed at the wound care center at [facility] on a weekly basis  Lower extremities: Left below-knee amputation healing well, right foot pink warm ulcerations completely healed No palpable pedal pulses right foot   Data: The patient had bilateral ABIs performed today. I reviewed and interpreted this study. ABI on the right was 1.22 with a toe pressure of 134 waveforms biphasic to triphasic  Assessment: Healing left below-knee amputation. Healed ulcerations right leg.  Plan: The patient will continue followup with the [facility] long wound center for his arm wounds. He'll return to see me in 6 months time for repeat ABIs. If they are normal at that time we may not  need further followup he is continuing the process of being fitted for a left leg prosthetic. He will return sooner if he has any new ulcerations on the right foot.    Fabienne Bruns, MD  [facility] [place]  Office: [phone]  Pager: [phone]

## 2012-09-15 NOTE — Addendum Note (Signed)
Addended by: Adria Dill L on: 09/15/2012 10:32 AM   Modules accepted: Orders

## 2012-09-25 ENCOUNTER — Other Ambulatory Visit: Payer: Self-pay | Admitting: *Deleted

## 2012-09-28 ENCOUNTER — Encounter (HOSPITAL_BASED_OUTPATIENT_CLINIC_OR_DEPARTMENT_OTHER): Payer: Medicare HMO | Attending: Internal Medicine

## 2012-09-28 DIAGNOSIS — S88119A Complete traumatic amputation at level between knee and ankle, unspecified lower leg, initial encounter: Secondary | ICD-10-CM | POA: Insufficient documentation

## 2012-09-28 DIAGNOSIS — I739 Peripheral vascular disease, unspecified: Secondary | ICD-10-CM | POA: Insufficient documentation

## 2012-09-28 DIAGNOSIS — L98499 Non-pressure chronic ulcer of skin of other sites with unspecified severity: Secondary | ICD-10-CM | POA: Insufficient documentation

## 2012-09-28 DIAGNOSIS — E119 Type 2 diabetes mellitus without complications: Secondary | ICD-10-CM | POA: Insufficient documentation

## 2012-10-03 NOTE — Progress Notes (Signed)
[facility]  NAME:  Tyler Erickson, Tyler Erickson                ACCOUNT NO.:  [id number]  MEDICAL RECORD NO.:  [id number]      DATE OF BIRTH:  Jun 13, 1951  PHYSICIAN:  Wayland Denis, DO       VISIT DATE:  10/02/2012                                  OFFICE VISIT   CHIEF COMPLAINT:  Bilateral upper extremity ulcers.  HISTORY OF PRESENT ILLNESS:  The patient is a 62 year old gentleman who is here for evaluation of bilateral upper extremity ulcers.  He was at the [facility], when he had some issues with the lower extremity that ended up getting him admitted to the hospital and he underwent a left lower extremity amputation.  He was treated with Rocephin at that time.  He had some weakness that developed in the right arm.  It is not clear what this is exactly from.  He had a workup at that time, which included MRA of his head and C-spine.  EMG studies are pending with Dr. Pearlean Brownie.  There was some concern about compartment syndrome at that time, but that was ruled out.  He now has at the distal portion of his right forearm a large ulcer with exposed tendon that looks to be necrotic.  He has a left medial elbow ulcer with muscle exposed.  They do not look like they are infected but definitely there is some necrosis in there.  He is using Santyl on the area at the moment.  PAST MEDICAL HISTORY:  Colon cancer, diabetes, brachial neuritis, peripheral vascular disease, and lymphedema.  PAST SURGICAL HISTORY:  Left BKA and colectomy in 2010.  SOCIAL HISTORY:  The patient lives in a nursing facility.  Does not smoke and has family that is available.  REVIEW OF SYSTEMS:  Otherwise, negative.  PHYSICAL EXAMINATION:  GENERAL:  On exam, he is alert, oriented, and cooperative.  He is very pleasant. HEENT:  His pupils are equal.  Extraocular muscles are intact. LUNGS:  His breathing is unlabored. HEART:  Regular. EXTREMITIES:  Have viable pulses but he has a large ulcer on  the right distal forearm which measures 7.5 x 4.0 x 1.1 and the left elbow 4.2 x 1.5 x 0.5.  There is some surrounding swelling.  He has sensation in the right arm but he is completely unable to extend his right wrist.  He has limited extension and flexion of his right elbow and weak flexion of the wrist.  It is unclear as to the etiology of this and I know this is being worked up by Dr. Meredith Mody.  The left elbow ulcer does not appear to involve the joint and has some hypergranulation tissue.  I think both areas need to be debrided.  Once they are debrided and there is some coverage, he may be a candidate for reconstruction depending on the issues, either tendon transfer or nerve graft.  This is unclear at this point in time, but will continue work on this.  It was also recommended that he take a multivitamin, vitamin C, zinc. We will continue with the Santyl and work on getting him to the OR.     Wayland Denis, DO     CS/MEDQ  D:  10/02/2012  T:  10/03/2012  Job:  [id number] 

## 2012-10-06 ENCOUNTER — Encounter (HOSPITAL_BASED_OUTPATIENT_CLINIC_OR_DEPARTMENT_OTHER): Payer: Self-pay | Admitting: Anesthesiology

## 2012-10-06 NOTE — Progress Notes (Signed)
NOTED PT HAD SURGERY AT [facility] IN JAN 2014. ANESTHESIA RATED PT ASAIV PRIOR TO SURGERY AND THEN HAD RESPIRATORY ARREST IN PACU THEN DISCHARGE WITH SEPSIS AND CVA WITH THE OTHER DX'S.  REVIEWED CHART W/ DR CARIGNIN MDA TODAY, HE STATES PT NEEDS TO BE DONE AT MAIN OR THAT HE WOULD NOT BE APPROPRIATE FOR AN OUTPT. FACILITY SETTING .  CALLED DR Kelly Splinter OFFICE AND SPOKE W/ SHAWN THE PA AND INFORMED HER OF THIS . SHE IS GOING TO SPEAK TO DR Kelly Splinter AND GET BACK WITH ME.

## 2012-10-10 ENCOUNTER — Encounter (HOSPITAL_COMMUNITY): Payer: Self-pay | Admitting: *Deleted

## 2012-10-10 ENCOUNTER — Other Ambulatory Visit: Payer: Self-pay | Admitting: Plastic Surgery

## 2012-10-10 DIAGNOSIS — L98493 Non-pressure chronic ulcer of skin of other sites with necrosis of muscle: Secondary | ICD-10-CM

## 2012-10-10 MED ORDER — CEFAZOLIN SODIUM-DEXTROSE 2-3 GM-% IV SOLR: 2.0000 g | INTRAVENOUS | Status: DC

## 2012-10-10 NOTE — Progress Notes (Signed)
Pts surgery was moved to [facility] for surgery due to the difficult post op during surgery in January 2014.  Pt had to be reintubated after arriving to PACU.  Dr Noreene Larsson was made aware and a note was placed on the front of the chart.

## 2012-10-10 NOTE — H&P (Signed)
Tyler Erickson is an 62 y.o. male.   Chief Complaint: ulceration of bilateral arms HPI: The patient is a 62 yrs old wm who is here for surgical treatment of bilateral arm ulcerations.  He has had a complicated course and now has tendon and muscle exposure of his right forearm and left elbow.  He has been treated with local care over the past several weeks and now is ready for debridement.  Past Medical History  Diagnosis Date  . ADENOCARCINOMA, COLON     S/P surgery/chemotherapy  . DM   . NEUROPATHY   . Kidney stones   . Large bowel obstruction   . Ischemic colon   . 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 CVA (cerebral infarction) 07/29/2012    06/29/12  . Peripheral vascular disease     Past Surgical History  Procedure Laterality Date  . Subtotal colectomy with ileostomy  06/27/2008  . Ileostomy closure  10/31/2009  . Porta-cath insertion  08/08/2008  . Amputation  06/30/2012    Procedure: AMPUTATION BELOW KNEE;  Surgeon: Sherren Kerns, MD;  Location: [facility] OR;  Service: Vascular;  Laterality: Left;  . Intraoperative arteriogram  06/30/2012    Procedure: INTRA OPERATIVE ARTERIOGRAM;  Surgeon: Sherren Kerns, MD;  Location: [facility] OR;  Service: Vascular;  Laterality: Right;  . Aortogram  06/30/2012    Procedure: AORTOGRAM;  Surgeon: Sherren Kerns, MD;  Location: [facility] OR;  Service: Vascular;  Laterality: Right;  Right upper extremity arch   . Tee without cardioversion  07/05/2012    Procedure: TRANSESOPHAGEAL ECHOCARDIOGRAM (TEE);  Surgeon: Vesta Mixer, MD;  Location: [facility] ENDOSCOPY;  Service: Cardiovascular;  Laterality: N/A;    Family History  Problem Relation Age of Onset  . Diabetes Mother   . Diabetes Father   . Heart failure Mother   . Heart failure Father   . Stroke Father   . Diabetes Sister   . Diabetes Sister   . Diabetes Brother    Social History:  reports that he has quit smoking. His smoking use included Cigars. He started smoking about 3 months ago. He has  never used smokeless tobacco. He reports that he does not drink alcohol or use illicit drugs.  Allergies: No Known Allergies   (Not in a hospital admission)  No results found for this or any previous visit (from the past 48 hour(s)). No results found.  Review of Systems  Constitutional: Negative.   HENT: Negative.   Eyes: Negative.   Respiratory: Negative.   Cardiovascular: Negative.   Gastrointestinal: Negative.   Genitourinary: Negative.   Skin: Negative.   Psychiatric/Behavioral: Negative.     There were no vitals taken for this visit. Physical Exam  Constitutional: He appears well-developed and well-nourished.  HENT:  Head: Normocephalic and atraumatic.  Eyes: Conjunctivae are normal. Pupils are equal, round, and reactive to light.  Cardiovascular: Normal rate.   Respiratory: Effort normal.  GI: Soft. He exhibits no distension.  Musculoskeletal:       Arms: Neurological: He is alert.  Skin: Skin is warm. There is erythema.  Psychiatric: He has a normal mood and affect. His behavior is normal.     Assessment/Plan Bilateral arm ulcers - irrigation and debridement of ulceration with possible Acell and VAC placement.  [name],[name] 10/10/2012, 3:49 PM

## 2012-10-10 NOTE — Progress Notes (Signed)
[facility]  NAME:  Tyler Erickson, Tyler Erickson                ACCOUNT NO.:  [id number]  MEDICAL RECORD NO.:  [id number]      DATE OF BIRTH:  04/20/1951  PHYSICIAN:  Wayland Denis, DO       VISIT DATE:  10/09/2012                                  OFFICE VISIT   The patient is a 62 year old gentleman who is here for followup on his bilateral upper extremity ulcers.  The patient has been using Santyl on the right and collagen on the left with a little bit of improvement, but not much.  We arranged to debride him in the operating room at outpatient, but due to his history, it was felt to be safer to do him in the main operating room.  So, we were trying to get him scheduled. There has been no change in his medications or social history otherwise.  On exam, he is alert, oriented, cooperative, not in any acute distress. He is pleasant.  Pupils are equal.  Extraocular muscles are intact.  No cervical lymphadenopathy.  His breathing is unlabored.  His heart is regular.  His right upper extremity has quite a bit of tendon exposed on the radial side laterally, and then his left elbow on the ulnar side. We will continue with the Santyl and collagen and work on getting him to the operating room.  We will also work on consult for seeing orthopedist at [facility], DO     CS/MEDQ  D:  10/09/2012  T:  10/10/2012  Job:  [id number]

## 2012-10-10 NOTE — Progress Notes (Signed)
Pt states that the only medications he takes at home now is Oxycodone and vitamins and Insulin.

## 2012-10-11 ENCOUNTER — Encounter (HOSPITAL_COMMUNITY)
Admission: RE | Disposition: A | Discharge status: Home / Self Care | Payer: Self-pay | Source: Ambulatory Visit | Attending: Plastic Surgery

## 2012-10-11 ENCOUNTER — Ambulatory Visit (HOSPITAL_COMMUNITY): Payer: Medicare HMO | Admitting: Anesthesiology

## 2012-10-11 ENCOUNTER — Encounter (HOSPITAL_COMMUNITY): Payer: Self-pay | Admitting: Anesthesiology

## 2012-10-11 ENCOUNTER — Ambulatory Visit: Payer: Medicare HMO | Admitting: Physical Therapy

## 2012-10-11 ENCOUNTER — Encounter (HOSPITAL_BASED_OUTPATIENT_CLINIC_OR_DEPARTMENT_OTHER): Admission: RE | Payer: Self-pay | Source: Ambulatory Visit

## 2012-10-11 ENCOUNTER — Ambulatory Visit (HOSPITAL_BASED_OUTPATIENT_CLINIC_OR_DEPARTMENT_OTHER): Admission: RE | Admit: 2012-10-11 | Payer: Medicare HMO | Source: Ambulatory Visit | Admitting: Plastic Surgery

## 2012-10-11 ENCOUNTER — Ambulatory Visit (HOSPITAL_COMMUNITY)
Admission: RE | Admit: 2012-10-11 | Discharge: 2012-10-11 | Disposition: A | Discharge status: Home / Self Care | Payer: Medicare HMO | Source: Ambulatory Visit | Attending: Plastic Surgery | Admitting: Plastic Surgery

## 2012-10-11 DIAGNOSIS — L98499 Non-pressure chronic ulcer of skin of other sites with unspecified severity: Secondary | ICD-10-CM | POA: Insufficient documentation

## 2012-10-11 DIAGNOSIS — L98493 Non-pressure chronic ulcer of skin of other sites with necrosis of muscle: Secondary | ICD-10-CM

## 2012-10-11 DIAGNOSIS — M6281 Muscle weakness (generalized): Secondary | ICD-10-CM | POA: Insufficient documentation

## 2012-10-11 DIAGNOSIS — R5381 Other malaise: Secondary | ICD-10-CM | POA: Insufficient documentation

## 2012-10-11 DIAGNOSIS — Z8673 Personal history of transient ischemic attack (TIA), and cerebral infarction without residual deficits: Secondary | ICD-10-CM | POA: Insufficient documentation

## 2012-10-11 DIAGNOSIS — N182 Chronic kidney disease, stage 2 (mild): Secondary | ICD-10-CM | POA: Insufficient documentation

## 2012-10-11 DIAGNOSIS — Z85038 Personal history of other malignant neoplasm of large intestine: Secondary | ICD-10-CM | POA: Insufficient documentation

## 2012-10-11 DIAGNOSIS — E1169 Type 2 diabetes mellitus with other specified complication: Secondary | ICD-10-CM | POA: Insufficient documentation

## 2012-10-11 DIAGNOSIS — IMO0001 Reserved for inherently not codable concepts without codable children: Secondary | ICD-10-CM | POA: Insufficient documentation

## 2012-10-11 DIAGNOSIS — R269 Unspecified abnormalities of gait and mobility: Secondary | ICD-10-CM | POA: Insufficient documentation

## 2012-10-11 HISTORY — PX: INCISION AND DRAINAGE OF WOUND: SHX1803

## 2012-10-11 LAB — CBC
HCT: 29.3 % — ABNORMAL LOW (ref 39.0–52.0)
Hemoglobin: 10 g/dL — ABNORMAL LOW (ref 13.0–17.0)
MCH: 27.5 pg (ref 26.0–34.0)
MCHC: 34.1 g/dL (ref 30.0–36.0)

## 2012-10-11 LAB — BASIC METABOLIC PANEL
BUN: 17 mg/dL (ref 6–23)
Chloride: 106 mEq/L (ref 96–112)
Glucose, Bld: 129 mg/dL — ABNORMAL HIGH (ref 70–99)
Potassium: 4.3 mEq/L (ref 3.5–5.1)

## 2012-10-11 LAB — GLUCOSE, CAPILLARY
Glucose-Capillary: 127 mg/dL — ABNORMAL HIGH (ref 70–99)
Glucose-Capillary: 106 mg/dL — ABNORMAL HIGH (ref 70–99)

## 2012-10-11 LAB — SURGICAL PCR SCREEN: Staphylococcus aureus: NEGATIVE

## 2012-10-11 SURGERY — IRRIGATION AND DEBRIDEMENT WOUND
Anesthesia: General | Laterality: Bilateral

## 2012-10-11 SURGERY — IRRIGATION AND DEBRIDEMENT WOUND
Anesthesia: General | Wound class: Dirty or Infected

## 2012-10-11 MED ORDER — LIDOCAINE HCL (CARDIAC) 20 MG/ML IV SOLN
INTRAVENOUS | Status: DC | PRN
  Administered 2012-10-11: 50 mg via INTRAVENOUS

## 2012-10-11 MED ORDER — SODIUM CHLORIDE 0.9 % IR SOLN
Status: DC | PRN
  Administered 2012-10-11: 1000 mL

## 2012-10-11 MED ORDER — ONDANSETRON HCL 4 MG/2ML IJ SOLN: 4.0000 mg | Freq: Four times a day (QID) | INTRAMUSCULAR | Status: DC | PRN

## 2012-10-11 MED ORDER — HYDROMORPHONE HCL PF 1 MG/ML IJ SOLN: 0.2500 mg | INTRAMUSCULAR | Status: DC | PRN

## 2012-10-11 MED ORDER — LACTATED RINGERS IV SOLN
INTRAVENOUS | Status: DC | PRN
  Administered 2012-10-11: 11:00:00 via INTRAVENOUS

## 2012-10-11 MED ORDER — SODIUM CHLORIDE 0.9 % IR SOLN
Status: DC | PRN
  Administered 2012-10-11: 13:00:00

## 2012-10-11 MED ORDER — ACETAMINOPHEN 10 MG/ML IV SOLN
INTRAVENOUS | Status: DC | PRN
  Administered 2012-10-11: 1000 mg via INTRAVENOUS

## 2012-10-11 MED ORDER — MUPIROCIN 2 % EX OINT
TOPICAL_OINTMENT | CUTANEOUS | Status: AC
  Filled 2012-10-11: qty 22

## 2012-10-11 MED ORDER — EPHEDRINE SULFATE 50 MG/ML IJ SOLN
INTRAMUSCULAR | Status: DC | PRN
  Administered 2012-10-11: 10 mg via INTRAVENOUS
  Administered 2012-10-11: 5 mg via INTRAVENOUS

## 2012-10-11 MED ORDER — OXYCODONE HCL 5 MG PO TABS: 5.0000 mg | ORAL_TABLET | Freq: Once | ORAL | Status: DC | PRN

## 2012-10-11 MED ORDER — FENTANYL CITRATE 0.05 MG/ML IJ SOLN
INTRAMUSCULAR | Status: DC | PRN
  Administered 2012-10-11: 50 ug via INTRAVENOUS

## 2012-10-11 MED ORDER — CEFAZOLIN SODIUM-DEXTROSE 2-3 GM-% IV SOLR
INTRAVENOUS | Status: AC
  Administered 2012-10-11: 2 g via INTRAVENOUS
  Filled 2012-10-11: qty 50

## 2012-10-11 MED ORDER — PROPOFOL 10 MG/ML IV BOLUS
INTRAVENOUS | Status: DC | PRN
  Administered 2012-10-11: 200 mg via INTRAVENOUS

## 2012-10-11 MED ORDER — OXYCODONE HCL 5 MG/5ML PO SOLN: 5.0000 mg | Freq: Once | ORAL | Status: DC | PRN

## 2012-10-11 MED ORDER — MIDAZOLAM HCL 5 MG/5ML IJ SOLN
INTRAMUSCULAR | Status: DC | PRN
  Administered 2012-10-11: 2 mg via INTRAVENOUS

## 2012-10-11 SURGICAL SUPPLY — 47 items
BANDAGE ELASTIC 4 VELCRO ST LF (GAUZE/BANDAGES/DRESSINGS) ×2 IMPLANT
BANDAGE ELASTIC 6 VELCRO ST LF (GAUZE/BANDAGES/DRESSINGS) ×6 IMPLANT
BANDAGE GAUZE ELAST BULKY 4 IN (GAUZE/BANDAGES/DRESSINGS) ×6 IMPLANT
BLADE SURG ROTATE 9660 (MISCELLANEOUS) IMPLANT
CANISTER SUCTION 2500CC (MISCELLANEOUS) ×2 IMPLANT
CHLORAPREP W/TINT 26ML (MISCELLANEOUS) IMPLANT
CLOTH BEACON ORANGE TIMEOUT ST (SAFETY) ×2 IMPLANT
COVER SURGICAL LIGHT HANDLE (MISCELLANEOUS) ×2 IMPLANT
DRAPE EXTREMITY T 121X128X90 (DRAPE) ×2 IMPLANT
DRAPE INCISE IOBAN 66X45 STRL (DRAPES) ×2 IMPLANT
DRAPE PROXIMA HALF (DRAPES) ×2 IMPLANT
DRSG ADAPTIC 3X8 NADH LF (GAUZE/BANDAGES/DRESSINGS) ×6 IMPLANT
DRSG PAD ABDOMINAL 8X10 ST (GAUZE/BANDAGES/DRESSINGS) ×4 IMPLANT
DRSG VAC ATS MED SENSATRAC (GAUZE/BANDAGES/DRESSINGS) ×2 IMPLANT
ELECT CAUTERY BLADE 6.4 (BLADE) ×2 IMPLANT
ELECT REM PT RETURN 9FT ADLT (ELECTROSURGICAL) ×2
ELECTRODE REM PT RTRN 9FT ADLT (ELECTROSURGICAL) ×1 IMPLANT
GLOVE BIO SURGEON STRL SZ 6.5 (GLOVE) ×2 IMPLANT
GLOVE BIOGEL PI IND STRL 8.5 (GLOVE) ×1 IMPLANT
GLOVE BIOGEL PI INDICATOR 8.5 (GLOVE) ×1
GLOVE SURG SS PI 6.5 STRL IVOR (GLOVE) ×4 IMPLANT
GLOVE SURG SS PI 8.5 STRL IVOR (GLOVE) ×1
GLOVE SURG SS PI 8.5 STRL STRW (GLOVE) ×1 IMPLANT
GOWN STRL NON-REIN LRG LVL3 (GOWN DISPOSABLE) ×4 IMPLANT
HANDPIECE INTERPULSE COAX TIP (DISPOSABLE)
KIT BASIN OR (CUSTOM PROCEDURE TRAY) ×2 IMPLANT
KIT ROOM TURNOVER OR (KITS) ×2 IMPLANT
MATRIX SURGICAL PSMX 10X15CM (Tissue) ×2 IMPLANT
MICROMATRIX 500MG (Tissue) ×2 IMPLANT
NS IRRIG 1000ML POUR BTL (IV SOLUTION) ×2 IMPLANT
PACK ORTHO EXTREMITY (CUSTOM PROCEDURE TRAY) ×2 IMPLANT
PAD ARMBOARD 7.5X6 YLW CONV (MISCELLANEOUS) ×4 IMPLANT
PAD CAST 4YDX4 CTTN HI CHSV (CAST SUPPLIES) ×1 IMPLANT
PADDING CAST COTTON 4X4 STRL (CAST SUPPLIES) ×1
SET HNDPC FAN SPRY TIP SCT (DISPOSABLE) IMPLANT
SOLUTION PARTIC MCRMTRX 500MG (Tissue) ×1 IMPLANT
SPLINT FIBERGLASS 3X35 (CAST SUPPLIES) ×4 IMPLANT
SPONGE GAUZE 4X4 12PLY (GAUZE/BANDAGES/DRESSINGS) ×2 IMPLANT
SPONGE LAP 18X18 X RAY DECT (DISPOSABLE) ×2 IMPLANT
STOCKINETTE 4X48 STRL (DRAPES) ×2 IMPLANT
SUT VIC AB 5-0 TF 27 (SUTURE) ×4 IMPLANT
SWAB COLLECTION DEVICE MRSA (MISCELLANEOUS) IMPLANT
TOWEL OR 17X24 6PK STRL BLUE (TOWEL DISPOSABLE) ×2 IMPLANT
TOWEL OR 17X26 10 PK STRL BLUE (TOWEL DISPOSABLE) ×2 IMPLANT
TUBE ANAEROBIC SPECIMEN COL (MISCELLANEOUS) IMPLANT
TUBE CONNECTING 12X1/4 (SUCTIONS) ×2 IMPLANT
UNDERPAD 30X30 INCONTINENT (UNDERPADS AND DIAPERS) ×2 IMPLANT

## 2012-10-11 NOTE — Anesthesia Postprocedure Evaluation (Signed)
  Anesthesia Post-op Note  Patient: Tyler Erickson  Procedure(s) Performed: Procedure(s) with comments: IRRIGATION AND DEBRIDEMENT OF RIGHT FOREARM AND LEFT ELBOW WITH POSSIBLE SURGICAL PREP WITH PLACEMENT OF ACELL AND VAC (N/A) - IRRIGATION AND DEBRIDEMENT OF RIGHT FOREARM AND LEFT ELBOW WITH PLACEMENT OF ACELL AND VAC  Patient Location: PACU  Anesthesia Type:General  Level of Consciousness: awake, oriented, sedated and patient cooperative  Airway and Oxygen Therapy: Patient Spontanous Breathing  Post-op Pain: mild  Post-op Assessment: Post-op Vital signs reviewed, Patient's Cardiovascular Status Stable, Respiratory Function Stable, Patent Airway, No signs of Nausea or vomiting and Pain level controlled  Post-op Vital Signs: stable  Complications: No apparent anesthesia complications

## 2012-10-11 NOTE — Anesthesia Preprocedure Evaluation (Signed)
Anesthesia Evaluation  Patient identified by MRN, date of birth, ID band Patient awake    Reviewed: Allergy & Precautions, H&P , NPO status , Patient's Chart, lab work & pertinent test results  Airway Mallampati: II  Neck ROM: full    Dental   Pulmonary shortness of breath, former smoker,          Cardiovascular + Peripheral Vascular Disease     Neuro/Psych  Neuromuscular disease CVA    GI/Hepatic   Endo/Other  diabetes, Type 2  Renal/GU      Musculoskeletal  (+) Arthritis -,   Abdominal   Peds  Hematology   Anesthesia Other Findings   Reproductive/Obstetrics                           Anesthesia Physical Anesthesia Plan  ASA: III  Anesthesia Plan: General   Post-op Pain Management:    Induction: Intravenous  Airway Management Planned: LMA  Additional Equipment:   Intra-op Plan:   Post-operative Plan:   Informed Consent: I have reviewed the patients History and Physical, chart, labs and discussed the procedure including the risks, benefits and alternatives for the proposed anesthesia with the patient or authorized representative who has indicated his/her understanding and acceptance.     Plan Discussed with: CRNA, Anesthesiologist and Surgeon  Anesthesia Plan Comments:         Anesthesia Quick Evaluation

## 2012-10-11 NOTE — H&P (View-Only) (Signed)
Tyler Erickson is an 61 y.o. male.   Chief Complaint: ulceration of bilateral arms HPI: The patient is a 61 yrs old wm who is here for surgical treatment of bilateral arm ulcerations.  He has had a complicated course and now has tendon and muscle exposure of his right forearm and left elbow.  He has been treated with local care over the past several weeks and now is ready for debridement.  Past Medical History  Diagnosis Date  . ADENOCARCINOMA, COLON     S/P surgery/chemotherapy  . DM   . NEUROPATHY   . Kidney stones   . Large bowel obstruction   . Ischemic colon   . 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 CVA (cerebral infarction) 07/29/2012    06/29/12  . Peripheral vascular disease     Past Surgical History  Procedure Laterality Date  . Subtotal colectomy with ileostomy  06/27/2008  . Ileostomy closure  10/31/2009  . Porta-cath insertion  08/08/2008  . Amputation  06/30/2012    Procedure: AMPUTATION BELOW KNEE;  Surgeon: Tyler E Fields, MD;  Location: [facility] OR;  Service: Vascular;  Laterality: Left;  . Intraoperative arteriogram  06/30/2012    Procedure: INTRA OPERATIVE ARTERIOGRAM;  Surgeon: Tyler E Fields, MD;  Location: [facility] OR;  Service: Vascular;  Laterality: Right;  . Aortogram  06/30/2012    Procedure: AORTOGRAM;  Surgeon: Tyler E Fields, MD;  Location: [facility];  Service: Vascular;  Laterality: Right;  Right upper extremity arch   . Tee without cardioversion  07/05/2012    Procedure: TRANSESOPHAGEAL ECHOCARDIOGRAM (TEE);  Surgeon: Tyler J Nahser, MD;  Location: [facility];  Service: Cardiovascular;  Laterality: N/A;    Family History  Problem Relation Age of Onset  . Diabetes Mother   . Diabetes Father   . Heart failure Mother   . Heart failure Father   . Stroke Father   . Diabetes Sister   . Diabetes Sister   . Diabetes Brother    Social History:  reports that he has quit smoking. His smoking use included Cigars. He started smoking about 3 months ago. He has  never used smokeless tobacco. He reports that he does not drink alcohol or use illicit drugs.  Allergies: No Known Allergies   (Not in a hospital admission)  No results found for this or any previous visit (from the past 48 hour(s)). No results found.  Review of Systems  Constitutional: Negative.   HENT: Negative.   Eyes: Negative.   Respiratory: Negative.   Cardiovascular: Negative.   Gastrointestinal: Negative.   Genitourinary: Negative.   Skin: Negative.   Psychiatric/Behavioral: Negative.     There were no vitals taken for this visit. Physical Exam  Constitutional: He appears well-developed and well-nourished.  HENT:  Head: Normocephalic and atraumatic.  Eyes: Conjunctivae are normal. Pupils are equal, round, and reactive to light.  Cardiovascular: Normal rate.   Respiratory: Effort normal.  GI: Soft. He exhibits no distension.  Musculoskeletal:       Arms: Neurological: He is alert.  Skin: Skin is warm. There is erythema.  Psychiatric: He has a normal mood and affect. His behavior is normal.     Assessment/Plan Bilateral arm ulcers - irrigation and debridement of ulceration with possible Acell and VAC placement.  Tyler Erickson 10/10/2012, 3:49 PM    

## 2012-10-11 NOTE — Interval H&P Note (Signed)
History and Physical Interval Note:  10/11/2012 11:40 AM  Tyler Erickson  has presented today for surgery, with the diagnosis of DIABETIC ULCER OF RIGHT FOREARM AND LEFT ELBOW  The various methods of treatment have been discussed with the patient and family. After consideration of risks, benefits and other options for treatment, the patient has consented to  Procedure(s): IRRIGATION AND DEBRIDEMENT OF RIGHT FOREARM AND LEFT ELBOW WITH POSSIBLE SURGICAL PREP WITH PLACEMENT OF ACELL AND VAC (N/A) as a surgical intervention .  The patient's history has been reviewed, patient examined, no change in status, stable for surgery.  I have reviewed the patient's chart and labs.  Questions were answered to the patient's satisfaction.     [name],CLAIRE

## 2012-10-11 NOTE — Anesthesia Procedure Notes (Signed)
Procedure Name: LMA Insertion Date/Time: 10/11/2012 11:54 AM Performed by: Carmela Rima Pre-anesthesia Checklist: Patient identified, Timeout performed, Emergency Drugs available, Suction available and Patient being monitored Patient Re-evaluated:Patient Re-evaluated prior to inductionOxygen Delivery Method: Circle system utilized Preoxygenation: Pre-oxygenation with 100% oxygen Intubation Type: IV induction Ventilation: Mask ventilation without difficulty LMA: LMA inserted LMA Size: 4.0 Number of attempts: 1 Placement Confirmation: positive ETCO2 and breath sounds checked- equal and bilateral Tube secured with: Tape Dental Injury: Teeth and Oropharynx as per pre-operative assessment

## 2012-10-11 NOTE — Transfer of Care (Signed)
Immediate Anesthesia Transfer of Care Note  Patient: Tyler Erickson  Procedure(s) Performed: Procedure(s) with comments: IRRIGATION AND DEBRIDEMENT OF RIGHT FOREARM AND LEFT ELBOW WITH POSSIBLE SURGICAL PREP WITH PLACEMENT OF ACELL AND VAC (N/A) - IRRIGATION AND DEBRIDEMENT OF RIGHT FOREARM AND LEFT ELBOW WITH POSSIBLE SURGICAL PREP WITH PLACEMENT OF ACELL AND VAC  Patient Location: PACU  Anesthesia Type:General  Level of Consciousness: awake, alert  and oriented  Airway & Oxygen Therapy: Patient Spontanous Breathing and Patient connected to nasal cannula oxygen  Post-op Assessment: Report given to PACU RN, Post -op Vital signs reviewed and stable and Patient moving all extremities X 4  Post vital signs: Reviewed and stable  Complications: No apparent anesthesia complications

## 2012-10-11 NOTE — OR Nursing (Signed)
Late entry by Timoteo Expose, RN to add 2nd procedure times.

## 2012-10-11 NOTE — Brief Op Note (Signed)
10/11/2012  1:27 PM  PATIENT:  Tyler Erickson  62 y.o. male  PRE-OPERATIVE DIAGNOSIS:  DIABETIC ULCER OF RIGHT FOREARM AND LEFT ELBOW  POST-OPERATIVE DIAGNOSIS:  DIABETIC ULCER OF RIGHT FOREARM AND LEFT ELBOW  PROCEDURE:  Procedure(s) with comments: IRRIGATION AND DEBRIDEMENT OF RIGHT FOREARM AND LEFT ELBOW WITH POSSIBLE SURGICAL PREP WITH PLACEMENT OF ACELL AND VAC (N/A) - IRRIGATION AND DEBRIDEMENT OF RIGHT FOREARM AND LEFT ELBOW WITH POSSIBLE SURGICAL PREP WITH PLACEMENT OF ACELL AND VAC  SURGEON:  Surgeon(s) and Role:    * Tour manager, DO - Primary  PHYSICIAN ASSISTANT: none  ASSISTANTS: Harrie Foreman, PA   ANESTHESIA:   general  EBL:  Total I/O In: 800 [I.V.:800] Out: 50 [Blood:50]  BLOOD ADMINISTERED:none  DRAINS: none   LOCAL MEDICATIONS USED:  NONE  SPECIMEN:  No Specimen  DISPOSITION OF SPECIMEN:  N/A  COUNTS:  YES  TOURNIQUET:  * No tourniquets in log *  DICTATION: .Dragon Dictation  PLAN OF CARE: Discharge to home after PACU  PATIENT DISPOSITION:  PACU - hemodynamically stable.   Delay start of Pharmacological VTE agent (>24hrs) due to surgical blood loss or risk of bleeding: no

## 2012-10-11 NOTE — Op Note (Signed)
Operative Note   Pre-operative Diagnosis: Open wound of the left and right arm  Post-operative Diagnosis: Same  Procedure: irrigation and debridement of left elbow and right arm  Right arm - debridement of skin, subcutaneous tissue, and tendon with placement of ACell and the VAC  Left arm - debridement of skin, subcutaneous tissue and Acell placement  Surgeon: Wayland Denis   Assistants: Harrie Foreman, LPN  Indications: The patient is a 62 y.o. male with an open wound of the right and left arm. I recommended irrigation & debridement with ACell and VAC placement.  Anesthesia: General endotracheal anesthesia  Procedure Details  The patient was seen in the Holding Room. The risks, benefits, complications, treatment options, and expected outcomes were discussed with the patient. The possibilities of reaction to medication, pulmonary aspiration, bleeding, recurrent infection, the need for additional procedures, failure to diagnose a condition, and creating a complication requiring operation were discussed with the patient. The patient concurred with the proposed plan, giving informed consent.  The site of surgery properly noted/marked. The patient was taken to the Operating Room, identified as Tyler Erickson and the procedure verified. A Time Out was held and the above information confirmed.  The patient was placed supine after induction of a general anesthetic. The wound area was then prepped with Betadine and draped in the usual sterile fashion. The wound was dbrided using sharp dissection to the margins of viable tissue including skin, subcutaneous tissue on both arms and also tendon on the right arm.  Good bleeding was encountered. The wounds were then irrigated with antibiotic saline liters of plain normal saline irrigant.  The ACell powder and sheet were placed on each wound. The sheet was secured with 5-0 vicryl. Adaptic was applied with surgilube.  The VAC sponge was placed on the right arm  with a volar blocking splint.  Gauze was placed on the left arm with an ace wrap.  The patient was allowed to wake up and extubated without difficulty. Instrument, sponge, and needle counts were correct prior to the wound closure and at the conclusion of the case.   Estimated Blood Loss:  Minimal                Complications:  None; patient tolerated the procedure well.         Disposition: PACU - hemodynamically stable.         Condition: stable

## 2012-10-12 ENCOUNTER — Encounter (HOSPITAL_COMMUNITY): Payer: Self-pay | Admitting: Plastic Surgery

## 2012-10-12 MED FILL — Mupirocin Oint 2%: CUTANEOUS | Qty: 22 | Status: AC

## 2012-10-16 ENCOUNTER — Encounter: Payer: Self-pay | Admitting: Physical Therapy

## 2012-10-16 ENCOUNTER — Ambulatory Visit: Payer: Medicare HMO | Attending: Vascular Surgery | Admitting: Physical Therapy

## 2012-10-16 NOTE — Progress Notes (Signed)
[facility]  NAME:  Tyler Erickson, Tyler Erickson                ACCOUNT NO.:  [id number]  MEDICAL RECORD NO.:  [id number]      DATE OF BIRTH:  [birth year]  PHYSICIAN:  Wayland Denis, DO       VISIT DATE:  10/16/2012                                  OFFICE VISIT   The patient is a 62 year old gentleman who is here for followup on his bilateral upper extremities.  His VAC was removed on the right arm.  He has a good take of the ACell at this point.  We will continue with the ACell, [place] and VAC on the right arm, and wrap on the left, and we checked his pre-albumin,  we will get the labs back and work on getting him to see the orthopedic surgeon at [facility] for possible reconstruction for nerve and tendon.     Wayland Denis, DO     CS/MEDQ  D:  10/16/2012  T:  10/16/2012  Job:  [id number]

## 2012-10-19 ENCOUNTER — Ambulatory Visit: Payer: Medicare HMO | Admitting: Physical Therapy

## 2012-10-25 ENCOUNTER — Ambulatory Visit: Payer: Medicare HMO | Admitting: Physical Therapy

## 2012-10-27 ENCOUNTER — Ambulatory Visit: Payer: Medicare HMO | Attending: Vascular Surgery | Admitting: *Deleted

## 2012-10-27 DIAGNOSIS — IMO0001 Reserved for inherently not codable concepts without codable children: Secondary | ICD-10-CM | POA: Insufficient documentation

## 2012-10-27 DIAGNOSIS — R269 Unspecified abnormalities of gait and mobility: Secondary | ICD-10-CM | POA: Insufficient documentation

## 2012-10-27 DIAGNOSIS — R5381 Other malaise: Secondary | ICD-10-CM | POA: Insufficient documentation

## 2012-10-27 DIAGNOSIS — M6281 Muscle weakness (generalized): Secondary | ICD-10-CM | POA: Insufficient documentation

## 2012-10-30 ENCOUNTER — Encounter (HOSPITAL_BASED_OUTPATIENT_CLINIC_OR_DEPARTMENT_OTHER): Payer: Medicare HMO | Attending: Plastic Surgery

## 2012-10-30 ENCOUNTER — Ambulatory Visit: Payer: Medicare HMO | Admitting: Physical Therapy

## 2012-10-30 DIAGNOSIS — L918 Other hypertrophic disorders of the skin: Secondary | ICD-10-CM | POA: Insufficient documentation

## 2012-10-30 DIAGNOSIS — L8990 Pressure ulcer of unspecified site, unspecified stage: Secondary | ICD-10-CM | POA: Insufficient documentation

## 2012-10-30 DIAGNOSIS — E119 Type 2 diabetes mellitus without complications: Secondary | ICD-10-CM | POA: Insufficient documentation

## 2012-10-30 DIAGNOSIS — M7989 Other specified soft tissue disorders: Secondary | ICD-10-CM | POA: Insufficient documentation

## 2012-10-30 DIAGNOSIS — L89899 Pressure ulcer of other site, unspecified stage: Secondary | ICD-10-CM | POA: Insufficient documentation

## 2012-10-30 DIAGNOSIS — L89009 Pressure ulcer of unspecified elbow, unspecified stage: Secondary | ICD-10-CM | POA: Insufficient documentation

## 2012-10-31 NOTE — Progress Notes (Signed)
[facility]  NAME:  Tyler Erickson, Tyler Erickson                     ACCOUNT NO.:  MEDICAL RECORD NO.:  [id number]      DATE OF BIRTH:  [birth year]  PHYSICIAN:  Wayland Denis, DO       VISIT DATE:  10/30/2012                                  OFFICE VISIT   The patient is a 62 year old male who is here for followup on his bilateral upper extremity ulcers.  He has diabetes and is the best that we know these were from pressure.  He has 1 on his left elbow.  This is healing and it does look much better.  He was treated with ACell.  The right 1 is much cleaner and granulating and there is no sign of infection.  The swelling in his arm has markedly improved as well.  He had ACell and is being treated with a VAC.  There is no change in his medications or social history.  He has got an appointment with Ortho Hand at [facility] later this month, and we will continue with the Adaptic Hydrogel and VAC on the right and then on the left.  We will use collagen.  We will see him back in a week.     Wayland Denis, DO     CS/MEDQ  D:  10/30/2012  T:  10/30/2012  Job:  [id number]

## 2012-11-01 ENCOUNTER — Ambulatory Visit: Payer: Medicare HMO | Admitting: Physical Therapy

## 2012-11-01 ENCOUNTER — Ambulatory Visit: Payer: Medicare HMO | Admitting: Occupational Therapy

## 2012-11-06 ENCOUNTER — Ambulatory Visit: Payer: Medicare HMO | Admitting: Occupational Therapy

## 2012-11-06 ENCOUNTER — Ambulatory Visit: Payer: Medicare HMO | Admitting: Physical Therapy

## 2012-11-07 NOTE — Progress Notes (Signed)
[facility]  NAME:  Tyler Erickson, Tyler Erickson                ACCOUNT NO.:  [id number]  MEDICAL RECORD NO.:  [id number]      DATE OF BIRTH:  1950-07-11  PHYSICIAN:  Wayland Denis, DO       VISIT DATE:  11/06/2012                                  OFFICE VISIT   The patient is a 62 year old male who is here for followup after surgical debridement of his right forearm and left elbow.  He is improving.  He has an appointment at [facility] for evaluation next week for the nerve injury and tendon injuries as well.  There has been no change in his medications or social history.  He is here with his wife.  On exam, he is alert, oriented, cooperative, not in any acute distress. He seemed a little better this week than he was last week.  There has been no change otherwise.  On exam, his pupils are equal.  Extraocular muscles are intact.  His breathing is unlabored and his pulses are regular.  The ACell on the ulnar side is still incorporating.  The rest of it has filled in very nicely and looks healthy.  There is a little bit of hypergranulation on the left elbow and Silver nitrate was used.  We will continue with the VAC.  We will use Endoform on the arm, Silvercel on the elbow.  Continue with VAC and see him back in a week.  When he has his exam, the VAC can be removed and wet-to-dry and he can come in the next day for his VAC placement.     Wayland Denis, DO     CS/MEDQ  D:  11/06/2012  T:  11/07/2012  Job:  [id number]

## 2012-11-08 ENCOUNTER — Ambulatory Visit: Payer: Medicare HMO | Admitting: Physical Therapy

## 2012-11-13 ENCOUNTER — Ambulatory Visit: Payer: Medicare HMO | Admitting: Physical Therapy

## 2012-11-15 ENCOUNTER — Ambulatory Visit: Payer: Medicare HMO | Admitting: Occupational Therapy

## 2012-11-15 ENCOUNTER — Ambulatory Visit: Payer: Medicare HMO | Admitting: Physical Therapy

## 2012-11-21 ENCOUNTER — Ambulatory Visit: Payer: Medicare HMO | Admitting: Physical Therapy

## 2012-11-21 ENCOUNTER — Ambulatory Visit: Payer: Medicare HMO | Admitting: Occupational Therapy

## 2012-11-23 ENCOUNTER — Encounter: Payer: Self-pay | Admitting: Gastroenterology

## 2012-11-23 ENCOUNTER — Ambulatory Visit: Payer: Medicare HMO | Admitting: Physical Therapy

## 2012-11-23 ENCOUNTER — Ambulatory Visit: Payer: Medicare HMO | Admitting: Occupational Therapy

## 2012-11-27 ENCOUNTER — Encounter (HOSPITAL_BASED_OUTPATIENT_CLINIC_OR_DEPARTMENT_OTHER): Payer: Medicare HMO | Attending: Plastic Surgery

## 2012-11-27 DIAGNOSIS — L98499 Non-pressure chronic ulcer of skin of other sites with unspecified severity: Secondary | ICD-10-CM | POA: Insufficient documentation

## 2012-11-27 NOTE — Progress Notes (Signed)
[facility]  NAME:  GUNTHER, ZAWADZKI                ACCOUNT NO.:  [id number]  MEDICAL RECORD NO.:  [id number]      DATE OF BIRTH:  19-May-1951  PHYSICIAN:  Wayland Denis, DO       VISIT DATE:  11/27/2012                                  OFFICE VISIT   The patient is a 62 year old gentleman who is here for followup on his right arm and left elbow.  He has been getting VAC changes until this past week when he had some periwound irritation.  So, the [facility] was held. He also had his consult with the hand surgeon who is in agreement with intervention as soon as the wound is healed.  He is likely going to need tendon transfers.  The wound is improving in size.  It looks healthy, granulating and there is some hypergranulation and silver nitrate was used on the right arm.  The left appears to be healed with just a small scab.  So, we will continue with SilverCel and the VAC on the right and triple antibiotics and a soft dressing on the left, and we will see him back in a week.     Wayland Denis, DO     CS/MEDQ  D:  11/27/2012  T:  11/27/2012  Job:  [id number]

## 2012-11-28 ENCOUNTER — Ambulatory Visit: Payer: Medicare HMO | Admitting: Physical Therapy

## 2012-11-28 ENCOUNTER — Encounter: Payer: Medicare HMO | Admitting: Physical Therapy

## 2012-11-28 ENCOUNTER — Ambulatory Visit: Payer: Medicare HMO | Attending: Vascular Surgery | Admitting: Occupational Therapy

## 2012-11-28 DIAGNOSIS — R5381 Other malaise: Secondary | ICD-10-CM | POA: Insufficient documentation

## 2012-11-28 DIAGNOSIS — IMO0001 Reserved for inherently not codable concepts without codable children: Secondary | ICD-10-CM | POA: Insufficient documentation

## 2012-11-28 DIAGNOSIS — M6281 Muscle weakness (generalized): Secondary | ICD-10-CM | POA: Insufficient documentation

## 2012-11-28 DIAGNOSIS — R269 Unspecified abnormalities of gait and mobility: Secondary | ICD-10-CM | POA: Insufficient documentation

## 2012-11-30 ENCOUNTER — Ambulatory Visit: Payer: Medicare HMO | Admitting: Physical Therapy

## 2012-11-30 ENCOUNTER — Ambulatory Visit: Payer: Medicare HMO | Admitting: Occupational Therapy

## 2012-12-05 ENCOUNTER — Ambulatory Visit: Payer: Medicare HMO | Admitting: Occupational Therapy

## 2012-12-05 ENCOUNTER — Ambulatory Visit: Payer: Medicare HMO | Admitting: Physical Therapy

## 2012-12-05 NOTE — Progress Notes (Signed)
[facility]  NAME:  Tyler Erickson, Tyler Erickson                     ACCOUNT NO.:  MEDICAL RECORD NO.:  [id number]      DATE OF BIRTH:  Sep 24, 1950  PHYSICIAN:  Wayland Denis, DO       VISIT DATE:  12/04/2012                                  OFFICE VISIT   The patient is a 62 year old gentleman who is here with his brother for followup on his arm and elbow ulcer.  The left elbow is completely healed and looks really good.  The right arm is improving.  He is much more superficial than it had been, a little bit of tendon was debrided, the rest of it is a little bit hypergranulated.  The Silver nitrate was used.  We will use Silvercel on the area under the [facility] and have him follow up in 1 week.     Wayland Denis, DO     CS/MEDQ  D:  12/04/2012  T:  12/05/2012  Job:  [id number]

## 2012-12-07 ENCOUNTER — Ambulatory Visit: Payer: Medicare HMO | Admitting: Occupational Therapy

## 2012-12-07 ENCOUNTER — Ambulatory Visit: Payer: Medicare HMO | Admitting: Physical Therapy

## 2012-12-11 NOTE — Progress Notes (Signed)
[facility]  NAME:  Tyler Erickson, Tyler Erickson                     ACCOUNT NO.:  MEDICAL RECORD NO.:  [id number]      DATE OF BIRTH:  1951/05/30  PHYSICIAN:  Wayland Denis, DO       VISIT DATE:  12/11/2012                                  OFFICE VISIT   The patient is a 62 year old male here for followup on his right forearm ulcer.  He has undergone debridement and ACell placement in the OR.  He is doing extremely well with the VAC.  There has been no change in his medications or social history.  His left elbow is healed.  His arm swelling has markedly improved as well.  His pain is improving.  He does have some left arm neuropathy.  The wound is clean, no sign of infection or fibrous tissue.  We will use Endoform and continue with the [facility] for this week and have him follow up in 1 week.  He is to continue with healthy eating as well.     Wayland Denis, DO     CS/MEDQ  D:  12/11/2012  T:  12/11/2012  Job:  [id number]

## 2012-12-12 ENCOUNTER — Ambulatory Visit: Payer: Medicare HMO | Admitting: Physical Therapy

## 2012-12-12 ENCOUNTER — Ambulatory Visit: Payer: Medicare HMO | Admitting: Occupational Therapy

## 2012-12-14 ENCOUNTER — Ambulatory Visit: Payer: Medicare HMO | Admitting: Physical Therapy

## 2012-12-14 ENCOUNTER — Ambulatory Visit: Payer: Medicare HMO | Admitting: Occupational Therapy

## 2012-12-18 NOTE — Progress Notes (Signed)
[facility]  NAME:  Tyler Erickson, Tyler Erickson                ACCOUNT NO.:  [id number]  MEDICAL RECORD NO.:  [id number]      DATE OF BIRTH:  1951-03-08  PHYSICIAN:  Wayland Denis, DO       VISIT DATE:  12/18/2012                                  OFFICE VISIT   The patient is a 62 year old gentleman who is here for followup on his right arm ulcer.  He is doing extremely well.  The area is healing with no sign of infection.  The surrounding area is much better and his overall arm has decreased in swelling and edema as well.  Recommend continuing with the VAC.  We added the Silvercel and we will see him back in 1 week.     Wayland Denis, DO     CS/MEDQ  D:  12/18/2012  T:  12/18/2012  Job:  [id number]

## 2012-12-19 ENCOUNTER — Ambulatory Visit: Payer: Medicare HMO | Admitting: Occupational Therapy

## 2012-12-19 ENCOUNTER — Ambulatory Visit: Payer: Medicare HMO | Admitting: Physical Therapy

## 2012-12-21 ENCOUNTER — Ambulatory Visit: Payer: Medicare HMO | Admitting: Occupational Therapy

## 2012-12-21 ENCOUNTER — Ambulatory Visit: Payer: Medicare HMO | Admitting: Physical Therapy

## 2012-12-26 ENCOUNTER — Ambulatory Visit: Payer: Medicare HMO | Admitting: Occupational Therapy

## 2012-12-26 ENCOUNTER — Encounter: Payer: Medicare HMO | Admitting: Physical Therapy

## 2012-12-26 NOTE — Progress Notes (Signed)
[facility]  NAME:  Tyler, Erickson                     ACCOUNT NO.:  MEDICAL RECORD NO.:  [id number]      DATE OF BIRTH:  11-26-1950  PHYSICIAN:  Wayland Denis, DO       VISIT DATE:  12/25/2012                                  OFFICE VISIT   The patient is a 62 year old gentleman who is here for followup on his right arm ulcer chronic.  He underwent irrigation and debridement, ACell placement and has done extremely well with improvement in his wound size.  We will continue with local care but hold the [facility] for a week as he has got some laceration, and hopefully we can put some Endoform near soon.  In the meantime, we will use collagen.     Wayland Denis, DO     CS/MEDQ  D:  12/25/2012  T:  12/25/2012  Job:  [id number]

## 2012-12-28 ENCOUNTER — Encounter: Payer: Medicare HMO | Admitting: Physical Therapy

## 2012-12-28 ENCOUNTER — Ambulatory Visit: Payer: Medicare HMO | Admitting: Occupational Therapy

## 2013-01-01 ENCOUNTER — Encounter (HOSPITAL_BASED_OUTPATIENT_CLINIC_OR_DEPARTMENT_OTHER): Payer: Medicare HMO | Attending: Plastic Surgery

## 2013-01-01 DIAGNOSIS — L98499 Non-pressure chronic ulcer of skin of other sites with unspecified severity: Secondary | ICD-10-CM | POA: Insufficient documentation

## 2013-01-01 NOTE — Progress Notes (Signed)
[facility]  NAME:  Tyler Erickson, Tyler Erickson                ACCOUNT NO.:  [id number]  MEDICAL RECORD NO.:  [id number]      DATE OF BIRTH:  14-Apr-1951  PHYSICIAN:  Wayland Denis, DO       VISIT DATE:  01/01/2013                                  OFFICE VISIT   The patient is a 62 year old male who is here for followup on his right arm ulcer.  He is overall doing well.  He has been off the [facility] for the past week.  He is alert, oriented, cooperative, not in any acute distress, pleasant.  Pupils equal.  Mentation is intact.  No cervical lymphadenopathy.  His breathing is unlabored and his heart rate is regular.  The wound is improving he has some hypergranulation on the radial aspect that was silver nitrated.  There is no sign of infection. The swelling has markedly improved in the entire arm and hand area. Recommend continuing with collagen alginate and he can turn the VAC in. We will see him back in a week.     Wayland Denis, DO     CS/MEDQ  D:  01/01/2013  T:  01/01/2013  Job:  [id number]

## 2013-01-08 NOTE — Progress Notes (Signed)
[facility]  NAME:  Tyler Erickson, Tyler Erickson                ACCOUNT NO.:  [id number]  MEDICAL RECORD NO.:  [id number]      DATE OF BIRTH:  21-Mar-1951  PHYSICIAN:  Wayland Denis, DO       VISIT DATE:  01/08/2013                                  OFFICE VISIT   The patient is a 62 year old gentleman who is here for followup on his right arm ulcer.  He is doing extremely better and shows even more improvement than last week with granulation and epithelialization over the wound.  His had is still improving with less swelling.  He is alert, oriented, cooperative, not in any acute distress.  There has been no change in his medications or social history.  We will continue with collagen and see him back in follow up in the next week.     Wayland Denis, DO     CS/MEDQ  D:  01/08/2013  T:  01/08/2013  Job:  [id number]

## 2013-01-16 NOTE — Progress Notes (Signed)
[facility]  NAME:  Tyler Erickson, Tyler Erickson                ACCOUNT NO.:  [id number]  MEDICAL RECORD NO.:  [id number]      DATE OF BIRTH:  January 14, 1951  PHYSICIAN:  Wayland Denis, DO       VISIT DATE:  01/15/2013                                  OFFICE VISIT   SUBJECTIVE:  The patient is a 62 year old male who is here for followup on his right arm ulcer, chronic.  We have been using several different products, and he is doing extremely well with remarkable improvement. It is about half the size that it was previously.  His arm and hand swelling has improved as well.  There has been no change in his medications or social history.  OBJECTIVE:  He is alert, oriented, and cooperative.  He is more alert and smiling more than he has in the past.  The wound is described above.  We will continue with Silvercel and see him back in a week.  He is encouraged to continue with multivitamins, vitamin C, zinc, and protein.     Wayland Denis, DO     CS/MEDQ  D:  01/15/2013  T:  01/16/2013  Job:  [id number]

## 2013-03-21 ENCOUNTER — Encounter: Payer: Self-pay | Admitting: Vascular Surgery

## 2013-03-22 ENCOUNTER — Ambulatory Visit (INDEPENDENT_AMBULATORY_CARE_PROVIDER_SITE_OTHER): Payer: Medicare HMO | Admitting: Vascular Surgery

## 2013-03-22 ENCOUNTER — Encounter: Payer: Self-pay | Admitting: Vascular Surgery

## 2013-03-22 ENCOUNTER — Ambulatory Visit (HOSPITAL_COMMUNITY)
Admission: RE | Admit: 2013-03-22 | Discharge: 2013-03-22 | Disposition: A | Discharge status: Home / Self Care | Payer: Medicare HMO | Source: Ambulatory Visit | Attending: Vascular Surgery | Admitting: Vascular Surgery

## 2013-03-22 VITALS — BP 127/70 | HR 70 | Resp 16 | Ht 66.5 in | Wt 152.0 lb

## 2013-03-22 DIAGNOSIS — Z48812 Encounter for surgical aftercare following surgery on the circulatory system: Secondary | ICD-10-CM

## 2013-03-22 DIAGNOSIS — I739 Peripheral vascular disease, unspecified: Secondary | ICD-10-CM

## 2013-03-22 NOTE — Progress Notes (Signed)
[facility]  Patient is a 62 year old male who returns today for followup after a left below-knee amputation approximately 5 months ago. At that time he also had a right upper extremity arteriogram for poor function in his right arm. This showed fairly severe distal occlusive disease but his arm was viable.   He was seen by several services including infectious disease, orthopedics, neurology, and critical care while in the hospital.  He still has no function in his right hand but is able to move his right arm some.  He is occasionally smoking. He is ambulatory on his right below-knee amputation.  Review of systems: He has shortness of breath with exertion. He denies chest pain.   Physical exam:  Filed Vitals:   03/22/13 1225  BP: 127/70  Pulse: 70  Resp: 16  Height: 5' 6.5" (1.689 m)  Weight: 152 lb (68.947 kg)  SpO2: 100%   Right upper extremity: Hand is pink warm and viable there is no flexion or extension of the wrist or hand intrinsics he does have some ability to flex and extend the elbow and abduct and adduct.the shoulder Lower extremities: Left below-knee amputation healed, right foot pink warm ulcerations completely healed No palpable pedal pulses right foot   Data: The patient had bilateral ABIs performed today. I reviewed and interpreted this study. ABI on the right was 1.22 with a toe pressure of 122 waveforms biphasic to triphasic  Assessment: Peripheral arterial disease stable  Plan: Followup one year for repeat ABIs and arterial duplex scan. The patient will followup sooner if he has any difficulties with his right lower extremity. Fabienne Bruns, MD   [facility]   Office: [phone]   Pager: [phone]

## 2013-03-28 NOTE — Addendum Note (Signed)
Addended by: Sharee Pimple on: 03/28/2013 11:52 AM   Modules accepted: Orders

## 2013-09-12 ENCOUNTER — Inpatient Hospital Stay (HOSPITAL_COMMUNITY)
Admission: EM | Admit: 2013-09-12 | Discharge: 2013-09-14 | DRG: 481 | Disposition: A | Discharge status: Skilled Nursing Facility | Payer: Medicare HMO | Attending: Internal Medicine | Admitting: Internal Medicine

## 2013-09-12 ENCOUNTER — Encounter (HOSPITAL_COMMUNITY)
Admission: EM | Disposition: A | Discharge status: Skilled Nursing Facility | Payer: Self-pay | Source: Home / Self Care | Attending: Internal Medicine

## 2013-09-12 ENCOUNTER — Encounter (HOSPITAL_COMMUNITY): Payer: Self-pay | Admitting: Emergency Medicine

## 2013-09-12 ENCOUNTER — Emergency Department (HOSPITAL_COMMUNITY)
Admission: EM | Admit: 2013-09-12 | Discharge: 2013-09-12 | Disposition: A | Discharge status: Other Acute Inpatient Hospital | Payer: Medicare HMO | Source: Home / Self Care | Attending: Family Medicine | Admitting: Family Medicine

## 2013-09-12 ENCOUNTER — Inpatient Hospital Stay (HOSPITAL_COMMUNITY): Payer: Medicare HMO

## 2013-09-12 ENCOUNTER — Encounter (HOSPITAL_COMMUNITY): Payer: Medicare HMO | Admitting: Anesthesiology

## 2013-09-12 ENCOUNTER — Inpatient Hospital Stay (HOSPITAL_COMMUNITY): Payer: Medicare HMO | Admitting: Anesthesiology

## 2013-09-12 ENCOUNTER — Emergency Department (INDEPENDENT_AMBULATORY_CARE_PROVIDER_SITE_OTHER): Payer: Medicare HMO

## 2013-09-12 DIAGNOSIS — N179 Acute kidney failure, unspecified: Secondary | ICD-10-CM

## 2013-09-12 DIAGNOSIS — L97509 Non-pressure chronic ulcer of other part of unspecified foot with unspecified severity: Secondary | ICD-10-CM

## 2013-09-12 DIAGNOSIS — E875 Hyperkalemia: Secondary | ICD-10-CM | POA: Diagnosis present

## 2013-09-12 DIAGNOSIS — B955 Unspecified streptococcus as the cause of diseases classified elsewhere: Secondary | ICD-10-CM

## 2013-09-12 DIAGNOSIS — Z79899 Other long term (current) drug therapy: Secondary | ICD-10-CM

## 2013-09-12 DIAGNOSIS — E871 Hypo-osmolality and hyponatremia: Secondary | ICD-10-CM

## 2013-09-12 DIAGNOSIS — N182 Chronic kidney disease, stage 2 (mild): Secondary | ICD-10-CM | POA: Diagnosis present

## 2013-09-12 DIAGNOSIS — D649 Anemia, unspecified: Secondary | ICD-10-CM

## 2013-09-12 DIAGNOSIS — E11621 Type 2 diabetes mellitus with foot ulcer: Secondary | ICD-10-CM

## 2013-09-12 DIAGNOSIS — Z794 Long term (current) use of insulin: Secondary | ICD-10-CM

## 2013-09-12 DIAGNOSIS — I70269 Atherosclerosis of native arteries of extremities with gangrene, unspecified extremity: Secondary | ICD-10-CM

## 2013-09-12 DIAGNOSIS — S72009A Fracture of unspecified part of neck of unspecified femur, initial encounter for closed fracture: Secondary | ICD-10-CM

## 2013-09-12 DIAGNOSIS — E876 Hypokalemia: Secondary | ICD-10-CM

## 2013-09-12 DIAGNOSIS — E43 Unspecified severe protein-calorie malnutrition: Secondary | ICD-10-CM

## 2013-09-12 DIAGNOSIS — A419 Sepsis, unspecified organism: Secondary | ICD-10-CM

## 2013-09-12 DIAGNOSIS — R531 Weakness: Secondary | ICD-10-CM

## 2013-09-12 DIAGNOSIS — S88119A Complete traumatic amputation at level between knee and ankle, unspecified lower leg, initial encounter: Secondary | ICD-10-CM

## 2013-09-12 DIAGNOSIS — E119 Type 2 diabetes mellitus without complications: Secondary | ICD-10-CM

## 2013-09-12 DIAGNOSIS — R7881 Bacteremia: Secondary | ICD-10-CM

## 2013-09-12 DIAGNOSIS — D72829 Elevated white blood cell count, unspecified: Secondary | ICD-10-CM

## 2013-09-12 DIAGNOSIS — R6521 Severe sepsis with septic shock: Secondary | ICD-10-CM

## 2013-09-12 DIAGNOSIS — E1159 Type 2 diabetes mellitus with other circulatory complications: Secondary | ICD-10-CM | POA: Diagnosis present

## 2013-09-12 DIAGNOSIS — G589 Mononeuropathy, unspecified: Secondary | ICD-10-CM

## 2013-09-12 DIAGNOSIS — I96 Gangrene, not elsewhere classified: Secondary | ICD-10-CM

## 2013-09-12 DIAGNOSIS — Z89519 Acquired absence of unspecified leg below knee: Secondary | ICD-10-CM

## 2013-09-12 DIAGNOSIS — I739 Peripheral vascular disease, unspecified: Secondary | ICD-10-CM

## 2013-09-12 DIAGNOSIS — S72002A Fracture of unspecified part of neck of left femur, initial encounter for closed fracture: Secondary | ICD-10-CM

## 2013-09-12 DIAGNOSIS — S7292XA Unspecified fracture of left femur, initial encounter for closed fracture: Secondary | ICD-10-CM | POA: Diagnosis present

## 2013-09-12 DIAGNOSIS — E872 Acidosis, unspecified: Secondary | ICD-10-CM

## 2013-09-12 DIAGNOSIS — W19XXXA Unspecified fall, initial encounter: Secondary | ICD-10-CM | POA: Diagnosis present

## 2013-09-12 DIAGNOSIS — M25619 Stiffness of unspecified shoulder, not elsewhere classified: Secondary | ICD-10-CM

## 2013-09-12 DIAGNOSIS — C189 Malignant neoplasm of colon, unspecified: Secondary | ICD-10-CM

## 2013-09-12 DIAGNOSIS — I639 Cerebral infarction, unspecified: Secondary | ICD-10-CM

## 2013-09-12 DIAGNOSIS — Z8673 Personal history of transient ischemic attack (TIA), and cerebral infarction without residual deficits: Secondary | ICD-10-CM

## 2013-09-12 HISTORY — PX: HIP PINNING,CANNULATED: SHX1758

## 2013-09-12 LAB — CBC WITH DIFFERENTIAL/PLATELET
Basophils Absolute: 0 10*3/uL (ref 0.0–0.1)
Basophils Relative: 0 % (ref 0–1)
EOS PCT: 2 % (ref 0–5)
Eosinophils Absolute: 0.2 10*3/uL (ref 0.0–0.7)
HEMATOCRIT: 43 % (ref 39.0–52.0)
HEMOGLOBIN: 14.9 g/dL (ref 13.0–17.0)
LYMPHS ABS: 2 10*3/uL (ref 0.7–4.0)
Lymphocytes Relative: 23 % (ref 12–46)
MCH: 29.7 pg (ref 26.0–34.0)
MCHC: 34.7 g/dL (ref 30.0–36.0)
MCV: 85.8 fL (ref 78.0–100.0)
MONOS PCT: 8 % (ref 3–12)
Monocytes Absolute: 0.7 10*3/uL (ref 0.1–1.0)
Neutro Abs: 5.7 10*3/uL (ref 1.7–7.7)
Neutrophils Relative %: 67 % (ref 43–77)
PLATELETS: ADEQUATE 10*3/uL (ref 150–400)
RBC: 5.01 MIL/uL (ref 4.22–5.81)
RDW: 13.1 % (ref 11.5–15.5)
WBC: 8.6 10*3/uL (ref 4.0–10.5)

## 2013-09-12 LAB — COMPREHENSIVE METABOLIC PANEL
ALT: 14 U/L (ref 0–53)
AST: 20 U/L (ref 0–37)
Albumin: 4.1 g/dL (ref 3.5–5.2)
Alkaline Phosphatase: 140 U/L — ABNORMAL HIGH (ref 39–117)
BILIRUBIN TOTAL: 0.3 mg/dL (ref 0.3–1.2)
BUN: 27 mg/dL — ABNORMAL HIGH (ref 6–23)
CHLORIDE: 100 meq/L (ref 96–112)
CO2: 24 meq/L (ref 19–32)
CREATININE: 1.46 mg/dL — AB (ref 0.50–1.35)
Calcium: 9.4 mg/dL (ref 8.4–10.5)
GFR calc Af Amer: 58 mL/min — ABNORMAL LOW (ref >90)
GFR, EST NON AFRICAN AMERICAN: 50 mL/min — AB (ref >90)
Glucose, Bld: 129 mg/dL — ABNORMAL HIGH (ref 70–99)
Potassium: 5.6 mEq/L — ABNORMAL HIGH (ref 3.7–5.3)
Sodium: 139 mEq/L (ref 137–147)
Total Protein: 7.9 g/dL (ref 6.0–8.3)

## 2013-09-12 LAB — GLUCOSE, CAPILLARY
Glucose-Capillary: 122 mg/dL — ABNORMAL HIGH (ref 70–99)
Glucose-Capillary: 132 mg/dL — ABNORMAL HIGH (ref 70–99)
Glucose-Capillary: 133 mg/dL — ABNORMAL HIGH (ref 70–99)

## 2013-09-12 LAB — POTASSIUM: Potassium: 4.7 mEq/L (ref 3.7–5.3)

## 2013-09-12 LAB — TYPE AND SCREEN
ABO/RH(D): O POS
Antibody Screen: NEGATIVE

## 2013-09-12 SURGERY — FIXATION, FEMUR, NECK, PERCUTANEOUS, USING SCREW
Anesthesia: General | Site: Leg Upper | Laterality: Left

## 2013-09-12 MED ORDER — HYDROCODONE-ACETAMINOPHEN 5-325 MG PO TABS
1.0000 | ORAL_TABLET | Freq: Four times a day (QID) | ORAL | Status: DC | PRN
  Administered 2013-09-14: 1 via ORAL
  Filled 2013-09-12: qty 2

## 2013-09-12 MED ORDER — OXYCODONE HCL 5 MG PO TABS
5.0000 mg | ORAL_TABLET | Freq: Once | ORAL | Status: AC | PRN
  Administered 2013-09-12: 5 mg via ORAL

## 2013-09-12 MED ORDER — MIDAZOLAM HCL 2 MG/2ML IJ SOLN
INTRAMUSCULAR | Status: AC
  Filled 2013-09-12: qty 2

## 2013-09-12 MED ORDER — METHOCARBAMOL 500 MG PO TABS
500.0000 mg | ORAL_TABLET | Freq: Four times a day (QID) | ORAL | Status: DC | PRN
  Administered 2013-09-12 – 2013-09-13 (×4): 500 mg via ORAL
  Filled 2013-09-12 (×4): qty 1

## 2013-09-12 MED ORDER — ONDANSETRON HCL 4 MG PO TABS
4.0000 mg | ORAL_TABLET | Freq: Four times a day (QID) | ORAL | Status: DC | PRN
Start: 2013-09-12 — End: 2013-09-14

## 2013-09-12 MED ORDER — LACTATED RINGERS IV SOLN
INTRAVENOUS | Status: DC
  Administered 2013-09-12: 17:00:00 via INTRAVENOUS

## 2013-09-12 MED ORDER — FENTANYL CITRATE 0.05 MG/ML IJ SOLN
INTRAMUSCULAR | Status: AC
  Filled 2013-09-12: qty 5

## 2013-09-12 MED ORDER — INSULIN ASPART 100 UNIT/ML ~~LOC~~ SOLN: 0.0000 [IU] | Freq: Three times a day (TID) | SUBCUTANEOUS | Status: DC

## 2013-09-12 MED ORDER — LIDOCAINE HCL (CARDIAC) 20 MG/ML IV SOLN
INTRAVENOUS | Status: DC | PRN
  Administered 2013-09-12: 60 mg via INTRAVENOUS
  Administered 2013-09-12: 40 mg via INTRAVENOUS

## 2013-09-12 MED ORDER — EPHEDRINE SULFATE 50 MG/ML IJ SOLN
INTRAMUSCULAR | Status: DC | PRN
  Administered 2013-09-12 (×2): 10 mg via INTRAVENOUS

## 2013-09-12 MED ORDER — OXYCODONE HCL 5 MG PO TABS
ORAL_TABLET | ORAL | Status: AC
  Filled 2013-09-12: qty 1

## 2013-09-12 MED ORDER — ROCURONIUM BROMIDE 50 MG/5ML IV SOLN
INTRAVENOUS | Status: AC
  Filled 2013-09-12: qty 1

## 2013-09-12 MED ORDER — MORPHINE SULFATE 2 MG/ML IJ SOLN
INTRAMUSCULAR | Status: AC
  Filled 2013-09-12: qty 1

## 2013-09-12 MED ORDER — FENTANYL CITRATE 0.05 MG/ML IJ SOLN
INTRAMUSCULAR | Status: DC | PRN
  Administered 2013-09-12: 100 ug via INTRAVENOUS
  Administered 2013-09-12: 50 ug via INTRAVENOUS

## 2013-09-12 MED ORDER — PHENOL 1.4 % MT LIQD: 1.0000 | OROMUCOSAL | Status: DC | PRN

## 2013-09-12 MED ORDER — METHOCARBAMOL 100 MG/ML IJ SOLN
500.0000 mg | Freq: Four times a day (QID) | INTRAMUSCULAR | Status: DC | PRN
  Filled 2013-09-12: qty 5

## 2013-09-12 MED ORDER — PROPOFOL 10 MG/ML IV BOLUS
INTRAVENOUS | Status: DC | PRN
  Administered 2013-09-12: 130 mg via INTRAVENOUS

## 2013-09-12 MED ORDER — CLINDAMYCIN PHOSPHATE 900 MG/50ML IV SOLN
900.0000 mg | Freq: Once | INTRAVENOUS | Status: DC
  Filled 2013-09-12: qty 50

## 2013-09-12 MED ORDER — METHOCARBAMOL 500 MG PO TABS
ORAL_TABLET | ORAL | Status: AC
  Filled 2013-09-12: qty 1

## 2013-09-12 MED ORDER — PROMETHAZINE HCL 25 MG/ML IJ SOLN: 6.2500 mg | INTRAMUSCULAR | Status: DC | PRN

## 2013-09-12 MED ORDER — INSULIN ASPART 100 UNIT/ML ~~LOC~~ SOLN
0.0000 [IU] | Freq: Three times a day (TID) | SUBCUTANEOUS | Status: DC
  Administered 2013-09-13: 2 [IU] via SUBCUTANEOUS
  Administered 2013-09-13: 3 [IU] via SUBCUTANEOUS
  Administered 2013-09-13: 17:00:00 via SUBCUTANEOUS
  Administered 2013-09-14: 3 [IU] via SUBCUTANEOUS

## 2013-09-12 MED ORDER — ROCURONIUM BROMIDE 100 MG/10ML IV SOLN
INTRAVENOUS | Status: DC | PRN
  Administered 2013-09-12: 40 mg via INTRAVENOUS
  Administered 2013-09-12: 10 mg via INTRAVENOUS

## 2013-09-12 MED ORDER — CLINDAMYCIN PHOSPHATE 600 MG/50ML IV SOLN
600.0000 mg | Freq: Once | INTRAVENOUS | Status: DC
  Administered 2013-09-12: 600 mg via INTRAVENOUS
  Filled 2013-09-12: qty 50

## 2013-09-12 MED ORDER — HYDROCODONE-ACETAMINOPHEN 7.5-325 MG PO TABS
1.0000 | ORAL_TABLET | ORAL | Status: AC | PRN
Start: 2013-09-12 — End: ?

## 2013-09-12 MED ORDER — NEOSTIGMINE METHYLSULFATE 1 MG/ML IJ SOLN
INTRAMUSCULAR | Status: DC | PRN
  Administered 2013-09-12: 5 mg via INTRAVENOUS

## 2013-09-12 MED ORDER — HYDROCODONE-ACETAMINOPHEN 5-325 MG PO TABS: 1.0000 | ORAL_TABLET | Freq: Four times a day (QID) | ORAL | Status: DC | PRN

## 2013-09-12 MED ORDER — 0.9 % SODIUM CHLORIDE (POUR BTL) OPTIME
TOPICAL | Status: DC | PRN
  Administered 2013-09-12: 1000 mL

## 2013-09-12 MED ORDER — MORPHINE SULFATE 2 MG/ML IJ SOLN: 0.5000 mg | INTRAMUSCULAR | Status: DC | PRN

## 2013-09-12 MED ORDER — ONDANSETRON HCL 4 MG/2ML IJ SOLN
INTRAMUSCULAR | Status: DC | PRN
  Administered 2013-09-12: 4 mg via INTRAVENOUS

## 2013-09-12 MED ORDER — GLYCOPYRROLATE 0.2 MG/ML IJ SOLN
INTRAMUSCULAR | Status: AC
  Filled 2013-09-12: qty 3

## 2013-09-12 MED ORDER — ENOXAPARIN SODIUM 30 MG/0.3ML ~~LOC~~ SOLN: 30.0000 mg | SUBCUTANEOUS | Status: DC

## 2013-09-12 MED ORDER — NEOSTIGMINE METHYLSULFATE 1 MG/ML IJ SOLN
INTRAMUSCULAR | Status: AC
  Filled 2013-09-12: qty 10

## 2013-09-12 MED ORDER — ACETAMINOPHEN 650 MG RE SUPP
650.0000 mg | Freq: Four times a day (QID) | RECTAL | Status: DC | PRN
Start: 2013-09-12 — End: 2013-09-14

## 2013-09-12 MED ORDER — ACETAMINOPHEN 325 MG PO TABS: 650.0000 mg | ORAL_TABLET | Freq: Four times a day (QID) | ORAL | Status: DC | PRN

## 2013-09-12 MED ORDER — ENOXAPARIN SODIUM 40 MG/0.4ML ~~LOC~~ SOLN: 40.0000 mg | SUBCUTANEOUS | Status: DC

## 2013-09-12 MED ORDER — GLYCOPYRROLATE 0.2 MG/ML IJ SOLN
INTRAMUSCULAR | Status: DC | PRN
  Administered 2013-09-12: 0.6 mg via INTRAVENOUS

## 2013-09-12 MED ORDER — LIDOCAINE HCL (CARDIAC) 20 MG/ML IV SOLN
INTRAVENOUS | Status: AC
  Filled 2013-09-12: qty 5

## 2013-09-12 MED ORDER — DOCUSATE SODIUM 100 MG PO CAPS: 100.0000 mg | ORAL_CAPSULE | Freq: Two times a day (BID) | ORAL | Status: AC | PRN

## 2013-09-12 MED ORDER — PROPOFOL 10 MG/ML IV BOLUS
INTRAVENOUS | Status: AC
  Filled 2013-09-12: qty 20

## 2013-09-12 MED ORDER — ONDANSETRON HCL 4 MG/2ML IJ SOLN: 4.0000 mg | Freq: Four times a day (QID) | INTRAMUSCULAR | Status: DC | PRN

## 2013-09-12 MED ORDER — METOCLOPRAMIDE HCL 10 MG PO TABS: 5.0000 mg | ORAL_TABLET | Freq: Three times a day (TID) | ORAL | Status: DC | PRN

## 2013-09-12 MED ORDER — DEXTROSE 5 % IV SOLN
INTRAVENOUS | Status: DC | PRN
  Administered 2013-09-12: 18:00:00 via INTRAVENOUS

## 2013-09-12 MED ORDER — ENOXAPARIN SODIUM 40 MG/0.4ML ~~LOC~~ SOLN
40.0000 mg | SUBCUTANEOUS | Status: DC
  Filled 2013-09-12: qty 0.4

## 2013-09-12 MED ORDER — HEPARIN SODIUM (PORCINE) 5000 UNIT/ML IJ SOLN
5000.0000 [IU] | Freq: Three times a day (TID) | INTRAMUSCULAR | Status: DC
  Administered 2013-09-13: 5000 [IU] via SUBCUTANEOUS
  Filled 2013-09-12 (×5): qty 1

## 2013-09-12 MED ORDER — CEFAZOLIN SODIUM-DEXTROSE 2-3 GM-% IV SOLR
2.0000 g | Freq: Four times a day (QID) | INTRAVENOUS | Status: AC
  Administered 2013-09-13 (×2): 2 g via INTRAVENOUS
  Filled 2013-09-12 (×2): qty 50

## 2013-09-12 MED ORDER — SODIUM CHLORIDE 0.9 % IV SOLN
INTRAVENOUS | Status: DC
  Administered 2013-09-12 (×2): via INTRAVENOUS

## 2013-09-12 MED ORDER — CEFAZOLIN SODIUM-DEXTROSE 2-3 GM-% IV SOLR
INTRAVENOUS | Status: DC | PRN
  Administered 2013-09-12: 2 g via INTRAVENOUS

## 2013-09-12 MED ORDER — OXYCODONE HCL 5 MG/5ML PO SOLN: 5.0000 mg | Freq: Once | ORAL | Status: AC | PRN

## 2013-09-12 MED ORDER — MENTHOL 3 MG MT LOZG: 1.0000 | LOZENGE | OROMUCOSAL | Status: DC | PRN

## 2013-09-12 MED ORDER — MORPHINE SULFATE 2 MG/ML IJ SOLN
1.0000 mg | INTRAMUSCULAR | Status: DC | PRN
  Administered 2013-09-12 (×3): 1 mg via INTRAVENOUS

## 2013-09-12 MED ORDER — ONDANSETRON HCL 4 MG/2ML IJ SOLN
INTRAMUSCULAR | Status: AC
  Filled 2013-09-12: qty 2

## 2013-09-12 MED ORDER — SODIUM CHLORIDE 0.9 % IV SOLN
Freq: Once | INTRAVENOUS | Status: AC
  Administered 2013-09-12: 125 mL/h via INTRAVENOUS

## 2013-09-12 MED ORDER — METOCLOPRAMIDE HCL 5 MG/ML IJ SOLN: 5.0000 mg | Freq: Three times a day (TID) | INTRAMUSCULAR | Status: DC | PRN

## 2013-09-12 MED ORDER — SODIUM CHLORIDE 0.9 % IV SOLN
INTRAVENOUS | Status: DC
  Administered 2013-09-13: via INTRAVENOUS

## 2013-09-12 MED ORDER — PHENYLEPHRINE HCL 10 MG/ML IJ SOLN
INTRAMUSCULAR | Status: DC | PRN
  Administered 2013-09-12: 40 ug via INTRAVENOUS

## 2013-09-12 SURGICAL SUPPLY — 39 items
BIT DRILL 5 ACE CANN QC (BIT) ×3 IMPLANT
CATH FOLEY LATEX FREE 16FR (CATHETERS) ×2
CATH FOLEY LF 16FR (CATHETERS) ×1 IMPLANT
COVER SURGICAL LIGHT HANDLE (MISCELLANEOUS) ×3 IMPLANT
DRAPE STERI IOBAN 125X83 (DRAPES) ×3 IMPLANT
DRAPE SURG IRRIG POUCH 19X23 (DRAPES) ×3 IMPLANT
DRILL BIT 7/64X5 (BIT) IMPLANT
DRSG ADAPTIC 3X8 NADH LF (GAUZE/BANDAGES/DRESSINGS) IMPLANT
DRSG MEPILEX BORDER 4X4 (GAUZE/BANDAGES/DRESSINGS) IMPLANT
DRSG MEPILEX BORDER 4X8 (GAUZE/BANDAGES/DRESSINGS) ×3 IMPLANT
DURAPREP 26ML APPLICATOR (WOUND CARE) ×3 IMPLANT
ELECT REM PT RETURN 9FT ADLT (ELECTROSURGICAL) ×3
ELECTRODE REM PT RTRN 9FT ADLT (ELECTROSURGICAL) ×1 IMPLANT
FACESHIELD LNG OPTICON STERILE (SAFETY) IMPLANT
GLOVE SURG SS PI 8.0 STRL IVOR (GLOVE) ×3 IMPLANT
GOWN EXTRA PROTECTION XL (GOWNS) IMPLANT
GOWN STRL REUS W/ TWL LRG LVL3 (GOWN DISPOSABLE) ×1 IMPLANT
GOWN STRL REUS W/ TWL XL LVL3 (GOWN DISPOSABLE) ×1 IMPLANT
GOWN STRL REUS W/TWL LRG LVL3 (GOWN DISPOSABLE) ×2
GOWN STRL REUS W/TWL XL LVL3 (GOWN DISPOSABLE) ×2
KIT ROOM TURNOVER OR (KITS) ×3 IMPLANT
MANIFOLD NEPTUNE II (INSTRUMENTS) IMPLANT
NEEDLE HYPO 25GX1X1/2 BEV (NEEDLE) IMPLANT
NS IRRIG 1000ML POUR BTL (IV SOLUTION) ×3 IMPLANT
PACK GENERAL/GYN (CUSTOM PROCEDURE TRAY) ×3 IMPLANT
PAD ARMBOARD 7.5X6 YLW CONV (MISCELLANEOUS) ×6 IMPLANT
PIN THREADED GUIDE ACE (PIN) ×9 IMPLANT
SCREW CANN 6.5 80MM (Screw) ×4 IMPLANT
SCREW CANN 6.5 85MM (Screw) ×4 IMPLANT
SCREW CANN LG 6.5 FLT 80X22 (Screw) ×2 IMPLANT
SCREW CANN LG 6.5 FLT 85X22 (Screw) ×2 IMPLANT
STAPLER VISISTAT (STAPLE) ×3 IMPLANT
SUT VIC AB 1 CTX 36 (SUTURE) ×2
SUT VIC AB 1 CTX36XBRD ANBCTR (SUTURE) ×1 IMPLANT
SUT VIC AB 2-0 CTB1 (SUTURE) ×6 IMPLANT
SYR CONTROL 10ML LL (SYRINGE) IMPLANT
TOWEL OR 17X24 6PK STRL BLUE (TOWEL DISPOSABLE) ×3 IMPLANT
TOWEL OR 17X26 10 PK STRL BLUE (TOWEL DISPOSABLE) ×3 IMPLANT
WATER STERILE IRR 1000ML POUR (IV SOLUTION) ×3 IMPLANT

## 2013-09-12 NOTE — Consult Note (Signed)
Reason for Consult: Left hip fracture Referring Physician: EDP  Tyler Erickson is an 63 y.o. male.  HPI: [place] 2 weeks ago. Hip pain  Past Medical History  Diagnosis Date  . ADENOCARCINOMA, COLON     S/P surgery/chemotherapy  . DM   . NEUROPATHY   . Kidney stones   . Large bowel obstruction   . Ischemic colon   . 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 CVA (cerebral infarction) 07/29/2012    06/29/12  . Peripheral vascular disease   . Shortness of breath     with exertion  . Stroke     mini stoke January 2014  . Arthritis   . History of blood transfusion     Past Surgical History  Procedure Laterality Date  . Subtotal colectomy with ileostomy  06/27/2008  . Ileostomy closure  10/31/2009  . Porta-cath insertion  08/08/2008  . Amputation  06/30/2012    Procedure: AMPUTATION BELOW KNEE;  Surgeon: Elam Dutch, MD;  Location: [place];  Service: Vascular;  Laterality: Left;  . Intraoperative arteriogram  06/30/2012    Procedure: INTRA OPERATIVE ARTERIOGRAM;  Surgeon: Elam Dutch, MD;  Location: [place];  Service: Vascular;  Laterality: Right;  . Aortogram  06/30/2012    Procedure: AORTOGRAM;  Surgeon: Elam Dutch, MD;  Location[facility] OR;  Service: Vascular;  Laterality: Right;  Right upper extremity arch   . Tee without cardioversion  07/05/2012    Procedure: TRANSESOPHAGEAL ECHOCARDIOGRAM (TEE);  Surgeon: Thayer Headings, MD;  Location: [place];  Service: Cardiovascular;  Laterality: N/A;  . Porta catheter removed    . Incision and drainage of wound N/A 10/11/2012    Procedure: IRRIGATION AND DEBRIDEMENT OF RIGHT FOREARM AND LEFT ELBOW WITH POSSIBLE SURGICAL PREP WITH PLACEMENT OF ACELL AND VAC;  Surgeon: Theodoro Kos, DO;  Location: [place];  Service: Plastics;  Laterality: N/A;  IRRIGATION AND DEBRIDEMENT OF RIGHT FOREARM AND LEFT ELBOW WITH PLACEMENT OF ACELL AND VAC    Family History  Problem Relation Age of Onset  . Diabetes Mother   . Diabetes Father   . Heart  failure Mother   . Heart failure Father   . Stroke Father   . Diabetes Sister   . Diabetes Sister   . Diabetes Brother     Social History:  reports that he has quit smoking. His smoking use included Cigars. He started smoking about 14 months ago. He has never used smokeless tobacco. He reports that he does not drink alcohol or use illicit drugs.  Allergies: No Known Allergies  Medications: I have reviewed the patient's current medications.  Results for orders placed during the hospital encounter of 09/12/13 (from the past 48 hour(s))  CBC WITH DIFFERENTIAL     Status: None   Collection Time    09/12/13  2:01 PM      Result Value Ref Range   WBC 8.6  4.0 - 10.5 K/uL   RBC 5.01  4.22 - 5.81 MIL/uL   Hemoglobin 14.9  13.0 - 17.0 g/dL   HCT 43.0  39.0 - 52.0 %   MCV 85.8  78.0 - 100.0 fL   MCH 29.7  26.0 - 34.0 pg   MCHC 34.7  30.0 - 36.0 g/dL   RDW 13.1  11.5 - 15.5 %   Platelets    150 - 400 K/uL   Value: PLATELET CLUMPS NOTED ON SMEAR, COUNT APPEARS ADEQUATE   Neutrophils Relative % 67  43 - 77 %   Lymphocytes Relative 23  12 - 46 %   Monocytes Relative 8  3 - 12 %   Eosinophils Relative 2  0 - 5 %   Basophils Relative 0  0 - 1 %   Neutro Abs 5.7  1.7 - 7.7 K/uL   Lymphs Abs 2.0  0.7 - 4.0 K/uL   Monocytes Absolute 0.7  0.1 - 1.0 K/uL   Eosinophils Absolute 0.2  0.0 - 0.7 K/uL   Basophils Absolute 0.0  0.0 - 0.1 K/uL   Smear Review FIBRIN STRANDS NOTED     Comment: MORPHOLOGY UNREMARKABLE  COMPREHENSIVE METABOLIC PANEL     Status: Abnormal   Collection Time    09/12/13  2:01 PM      Result Value Ref Range   Sodium 139  137 - 147 mEq/L   Potassium 5.6 (*) 3.7 - 5.3 mEq/L   Comment: HEMOLYSIS AT THIS LEVEL MAY AFFECT RESULT   Chloride 100  96 - 112 mEq/L   CO2 24  19 - 32 mEq/L   Glucose, Bld 129 (*) 70 - 99 mg/dL   BUN 27 (*) 6 - 23 mg/dL   Creatinine, Ser 1.46 (*) 0.50 - 1.35 mg/dL   Calcium 9.4  8.4 - 10.5 mg/dL   Total Protein 7.9  6.0 - 8.3 g/dL   Albumin 4.1   3.5 - 5.2 g/dL   AST 20  0 - 37 U/L   Comment: HEMOLYSIS AT THIS LEVEL MAY AFFECT RESULT   ALT 14  0 - 53 U/L   Comment: HEMOLYSIS AT THIS LEVEL MAY AFFECT RESULT   Alkaline Phosphatase 140 (*) 39 - 117 U/L   Total Bilirubin 0.3  0.3 - 1.2 mg/dL   GFR calc non Af Amer 50 (*) >90 mL/min   GFR calc Af Amer 58 (*) >90 mL/min   Comment: (NOTE)     The eGFR has been calculated using the CKD EPI equation.     This calculation has not been validated in all clinical situations.     eGFR's persistently <90 mL/min signify possible Chronic Kidney     Disease.    Dg Hip Complete Left  09/12/2013   CLINICAL DATA:  Pain post fall 3 weeks ago  EXAM: LEFT HIP - COMPLETE 2+ VIEW  COMPARISON:  None.  FINDINGS: Three views of the left hip submitted. There is minimal displaced fracture of the left femoral neck. Atherosclerotic calcifications of femoral artery. There is spurring of greater femoral trochanter. Mild spurring of superior acetabulum.  IMPRESSION: Minimal displaced fracture of the left femoral neck.   Electronically Signed   By: Lahoma Crocker M.D.   On: 09/12/2013 11:49   Dg Knee Complete 4 Views Left  09/12/2013   CLINICAL DATA:  Left knee pain after fall.  EXAM: LEFT KNEE - COMPLETE 4+ VIEW  COMPARISON:  None.  FINDINGS: Status post left below-knee amputation. No acute fracture or dislocation is noted. No significant joint space narrowing is noted. No significant joint effusion is noted.  IMPRESSION: Status post below-knee amputation. No acute abnormality seen in the left knee.   Electronically Signed   By: Sabino Dick M.D.   On: 09/12/2013 12:16    Review of Systems  Musculoskeletal: Positive for joint pain.  All other systems reviewed and are negative.   Blood pressure 133/69, pulse 56, temperature 97.6 F (36.4 C), temperature source Oral, resp. rate 14, height $RemoveBe'5\' 8"'MbLCzenUL$  (1.727 m), weight 71.215 kg (157  lb), SpO2 100.00%. Physical Exam  Constitutional: He is oriented to person, place, and time.  He appears well-developed.  HENT:  Head: Normocephalic.  Eyes: Pupils are equal, round, and reactive to light.  Neck: Normal range of motion.  Cardiovascular: Normal rate.   Respiratory: Effort normal.  GI: Soft.  Musculoskeletal:  Left BKA. Pain with ROM left hip.  Neurological: He is alert and oriented to person, place, and time.  Skin: Skin is warm and dry.  Psychiatric: He has a normal mood and affect.    Assessment/Plan: Minimally displaced subacute hip fracture left. S/P BKA left. PVD IDDM Plan ORIF cannulated screws after medically clearance. Risks discussed.  Kasiah Manka C 09/12/2013, 3:35 PM

## 2013-09-12 NOTE — ED Provider Notes (Signed)
Medical screening examination/treatment/procedure(s) were performed by non-physician practitioner and as supervising physician I was immediately available for consultation/collaboration.   EKG Interpretation   Date/Time:  Wednesday September 12 2013 14:28:17 EDT Ventricular Rate:  61 PR Interval:  152 QRS Duration: 72 QT Interval:  402 QTC Calculation: 404 R Axis:   -15 Text Interpretation:  Normal sinus rhythm Cannot rule out Anterior infarct  , age undetermined Abnormal ECG No significant change since last tracing  Confirmed by [facility]  MD, Zhara Gieske [phone]) on 09/12/2013 3:11:52 PM        Juanda Crumble B. Karle Starch, MD 09/12/13 1515

## 2013-09-12 NOTE — ED Notes (Signed)
Pt   Reports         Fell  3  Weeks  Ago    [place]  [employer]      - Pt  Has  BKA    AND  HAS A  PROSTHESIS      Pt    Reports  Pain l  Knee  And  Hip

## 2013-09-12 NOTE — ED Provider Notes (Signed)
CSN: [id number]     Arrival date & time 09/12/13  1053 History   First MD Initiated Contact with Patient 09/12/13 1107     No chief complaint on file.  (Consider location/radiation/quality/duration/timing/severity/associated sxs/prior Treatment) HPI Comments: Patient is s/p remote left BKA (Dr. [place] Alar) and while walking on 08/25/2013 using his leg prosthesis, reports he slipped and fell on the ice landing on his left hip and buttock. States initial pain following fall has improved, but he still has some residual discomfort at left hip and left knee with ambulation. Here at [facility] requesting xrays of left hip and left knee. Accompanied by his sister.  The history is provided by the patient.    Past Medical History  Diagnosis Date  . ADENOCARCINOMA, COLON     S/P surgery/chemotherapy  . DM   . NEUROPATHY   . Kidney stones   . Large bowel obstruction   . Ischemic colon   . 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 CVA (cerebral infarction) 07/29/2012    06/29/12  . Peripheral vascular disease   . Shortness of breath     with exertion  . Stroke     mini stoke January 2014  . Arthritis   . History of blood transfusion    Past Surgical History  Procedure Laterality Date  . Subtotal colectomy with ileostomy  06/27/2008  . Ileostomy closure  10/31/2009  . Porta-cath insertion  08/08/2008  . Amputation  06/30/2012    Procedure: AMPUTATION BELOW KNEE;  Surgeon: Elam Dutch, MD;  Location: [place];  Service: Vascular;  Laterality: Left;  . Intraoperative arteriogram  06/30/2012    Procedure: INTRA OPERATIVE ARTERIOGRAM;  Surgeon: Elam Dutch, MD;  Location: [place];  Service: Vascular;  Laterality: Right;  . Aortogram  06/30/2012    Procedure: AORTOGRAM;  Surgeon: Elam Dutch, MD;  Location: [facility] OR;  Service: Vascular;  Laterality: Right;  Right upper extremity arch   . Tee without cardioversion  07/05/2012    Procedure: TRANSESOPHAGEAL ECHOCARDIOGRAM (TEE);  Surgeon: Thayer Headings,  MD;  Location: [place];  Service: Cardiovascular;  Laterality: N/A;  . Porta catheter removed    . Incision and drainage of wound N/A 10/11/2012    Procedure: IRRIGATION AND DEBRIDEMENT OF RIGHT FOREARM AND LEFT ELBOW WITH POSSIBLE SURGICAL PREP WITH PLACEMENT OF ACELL AND VAC;  Surgeon: Theodoro Kos, DO;  Location: [place];  Service: Plastics;  Laterality: N/A;  IRRIGATION AND DEBRIDEMENT OF RIGHT FOREARM AND LEFT ELBOW WITH PLACEMENT OF ACELL AND VAC   Family History  Problem Relation Age of Onset  . Diabetes Mother   . Diabetes Father   . Heart failure Mother   . Heart failure Father   . Stroke Father   . Diabetes Sister   . Diabetes Sister   . Diabetes Brother    History  Substance Use Topics  . Smoking status: Former Smoker    Types: Cigars    Start date: 06/29/2012  . Smokeless tobacco: Never Used     Comment: last cigar a month ago  . Alcohol Use: No    Review of Systems  All other systems reviewed and are negative.    Allergies  Review of patient's allergies indicates no known allergies.  Home Medications   Current Outpatient Rx  Name  Route  Sig  Dispense  Refill  . chlorhexidine (PERIDEX) 0.12 % solution   Mouth/Throat   Use as directed 15 mLs in the mouth or  throat 2 (two) times daily.         . insulin aspart (NOVOLOG) 100 UNIT/ML injection   Subcutaneous   Inject 5 Units into the skin 3 (three) times daily before meals. If CBG above 150, give 5 units subcutaneously 15 minutes before or 30 minutes after a meal and at bedtime.         . insulin glargine (LANTUS) 100 UNIT/ML injection   Subcutaneous   Inject 14 Units into the skin at bedtime.   10 mL   0   . metFORMIN (GLUCOPHAGE-XR) 500 MG 24 hr tablet   Oral   Take 1 tablet by mouth 2 (two) times daily.         . Multiple Vitamins-Minerals (DECUBI-VITE) CAPS   Oral   Take 1 capsule by mouth 2 (two) times daily.         . ondansetron (ZOFRAN) 4 MG/2ML SOLN injection   Intravenous    Inject 2 mLs (4 mg total) into the vein every 6 (six) hours as needed for nausea.   2 mL      . oxyCODONE (OXY IR/ROXICODONE) 5 MG immediate release tablet   Oral   Take 1-2 tablets (5-10 mg total) by mouth every 4 (four) hours as needed for pain.   20 tablet   0   . saccharomyces boulardii (FLORASTOR) 250 MG capsule   Oral   Take 1 capsule (250 mg total) by mouth 2 (two) times daily.         Marland Kitchen SANTYL ointment   Topical   Apply 1 application topically daily.         Marland Kitchen sulfamethoxazole-trimethoprim (BACTRIM DS) 800-160 MG per tablet   Oral   Take 1 tablet by mouth 2 (two) times daily.          BP 126/71  Pulse 85  Temp(Src) 97.6 F (36.4 C) (Oral)  Resp 16  SpO2 100% Physical Exam  Nursing note and vitals reviewed. Constitutional: He is oriented to person, place, and time. He appears well-developed and well-nourished.  HENT:  Head: Normocephalic and atraumatic.  Eyes: Conjunctivae are normal.  Neck: Normal range of motion. Neck supple.  Cardiovascular: Normal rate.   Pulmonary/Chest: Effort normal.  Abdominal: Soft. There is no tenderness.  Musculoskeletal: Normal range of motion.       Left hip: He exhibits tenderness and bony tenderness. He exhibits normal range of motion, normal strength, no swelling, no crepitus, no deformity and no laceration.       Left knee: Normal.       Legs: Neurological: He is alert and oriented to person, place, and time.  Skin: Skin is warm and dry.  Psychiatric: He has a normal mood and affect. His behavior is normal.    ED Course  Procedures (including critical care time) Labs Review Labs Reviewed - No data to display Imaging Review Dg Hip Complete Left  09/12/2013   CLINICAL DATA:  Pain post fall 3 weeks ago  EXAM: LEFT HIP - COMPLETE 2+ VIEW  COMPARISON:  None.  FINDINGS: Three views of the left hip submitted. There is minimal displaced fracture of the left femoral neck. Atherosclerotic calcifications of femoral artery.  There is spurring of greater femoral trochanter. Mild spurring of superior acetabulum.  IMPRESSION: Minimal displaced fracture of the left femoral neck.   Electronically Signed   By: Lahoma Crocker M.D.   On: 09/12/2013 11:49   Dg Knee Complete 4 Views Left  09/12/2013   CLINICAL  DATA:  Left knee pain after fall.  EXAM: LEFT KNEE - COMPLETE 4+ VIEW  COMPARISON:  None.  FINDINGS: Status post left below-knee amputation. No acute fracture or dislocation is noted. No significant joint space narrowing is noted. No significant joint effusion is noted.  IMPRESSION: Status post below-knee amputation. No acute abnormality seen in the left knee.   Electronically Signed   By: Sabino Dick M.D.   On: 09/12/2013 12:16     MDM   1. Fracture of femoral neck, left, closed    Fracture of left femoral neck. To be transferred to [facility] for further management.   [place], [place] 09/12/13 1225

## 2013-09-12 NOTE — ED Notes (Signed)
Changed acuity per PA.

## 2013-09-12 NOTE — H&P (Signed)
[facility] History and Physical  ADHAV CROXFORD [id number] DOB: August 16, 1950 DOA: 09/12/2013  Referring physician: Dr. Karle Starch PCP: Donnie Coffin, MD   Chief Complaint: Left femur fracture  HPI: Tyler Erickson is a 63 y.o. male  Patient is a 63 year old Caucasian male with history of diabetes mellitus, history of left BKA by Dr. [place] Alar. He states that he has had pain since the fall on 08/25/2013. The fall was on the left side.  He states that he has had muscle aches and sores at his left leg since.  Given his persistent symptoms he presented to the ed for further evaluation and recommendations.  He denies any fevers or chills.    In the ED patient was found to have a fractured L femur and we were subsequently called for further evaluation and recommendations.    Review of Systems:  14 point review of system negative unless otherwise mentioned above.  Past Medical History  Diagnosis Date  . ADENOCARCINOMA, COLON     S/P surgery/chemotherapy  . DM   . NEUROPATHY   . Kidney stones   . Large bowel obstruction   . Ischemic colon   . 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 CVA (cerebral infarction) 07/29/2012    06/29/12  . Peripheral vascular disease   . Shortness of breath     with exertion  . Stroke     mini stoke January 2014  . Arthritis   . History of blood transfusion    Past Surgical History  Procedure Laterality Date  . Subtotal colectomy with ileostomy  06/27/2008  . Ileostomy closure  10/31/2009  . Porta-cath insertion  08/08/2008  . Amputation  06/30/2012    Procedure: AMPUTATION BELOW KNEE;  Surgeon: Elam Dutch, MD;  Location: [place];  Service: Vascular;  Laterality: Left;  . Intraoperative arteriogram  06/30/2012    Procedure: INTRA OPERATIVE ARTERIOGRAM;  Surgeon: Elam Dutch, MD;  Location: [place];  Service: Vascular;  Laterality: Right;  . Aortogram  06/30/2012    Procedure: AORTOGRAM;  Surgeon: Elam Dutch, MD;  Location: [facility] OR;   Service: Vascular;  Laterality: Right;  Right upper extremity arch   . Tee without cardioversion  07/05/2012    Procedure: TRANSESOPHAGEAL ECHOCARDIOGRAM (TEE);  Surgeon: Thayer Headings, MD;  Location: [place];  Service: Cardiovascular;  Laterality: N/A;  . Porta catheter removed    . Incision and drainage of wound N/A 10/11/2012    Procedure: IRRIGATION AND DEBRIDEMENT OF RIGHT FOREARM AND LEFT ELBOW WITH POSSIBLE SURGICAL PREP WITH PLACEMENT OF ACELL AND VAC;  Surgeon: Theodoro Kos, DO;  Location: [place];  Service: Plastics;  Laterality: N/A;  IRRIGATION AND DEBRIDEMENT OF RIGHT FOREARM AND LEFT ELBOW WITH PLACEMENT OF ACELL AND VAC   Social History:  reports that he has quit smoking. His smoking use included Cigars. He started smoking about 14 months ago. He has never used smokeless tobacco. He reports that he does not drink alcohol or use illicit drugs.  No Known Allergies  Family History  Problem Relation Age of Onset  . Diabetes Mother   . Diabetes Father   . Heart failure Mother   . Heart failure Father   . Stroke Father   . Diabetes Sister   . Diabetes Sister   . Diabetes Brother      Prior to Admission medications   Medication Sig Start Date End Date Taking? Authorizing Provider  insulin glargine (LANTUS) 100 UNIT/ML injection Inject 20 Units  into the skin at bedtime. 07/30/12  Yes Eugenie Filler, MD  metFORMIN (GLUCOPHAGE-XR) 500 MG 24 hr tablet Take 1 tablet by mouth 2 (two) times daily. 09/07/12  Yes Historical Provider, MD   Physical Exam: Filed Vitals:   09/12/13 1524  BP: 133/69  Pulse: 56  Temp: 97.6 F (36.4 C)  Resp: 14    BP 133/69  Pulse 56  Temp(Src) 97.6 F (36.4 C) (Oral)  Resp 14  Ht 5\' 8"  (1.727 m)  Wt 71.215 kg (157 lb)  BMI 23.88 kg/m2  SpO2 100%  General:  Appears calm and comfortable Eyes: PERRL, normal lids, irises & conjunctiva ENT: grossly normal hearing, lips & tongue Neck: no LAD, masses or thyromegaly Cardiovascular: RRR, no  m/r/g. No LE edema. Telemetry: SR, no arrhythmias  Respiratory: CTA bilaterally, no w/r/r. Normal respiratory effort. Abdomen: soft, ntnd Skin: no rash or induration seen on limited exam Musculoskeletal: grossly normal tone BUE/BLE Psychiatric: grossly normal mood and affect, speech fluent and appropriate Neurologic: grossly non-focal.          Labs on Admission:  Basic Metabolic Panel:  Recent Labs Lab 09/12/13 1401  NA 139  K 5.6*  CL 100  CO2 24  GLUCOSE 129*  BUN 27*  CREATININE 1.46*  CALCIUM 9.4   Liver Function Tests:  Recent Labs Lab 09/12/13 1401  AST 20  ALT 14  ALKPHOS 140*  BILITOT 0.3  PROT 7.9  ALBUMIN 4.1   No results found for this basename: LIPASE, AMYLASE,  in the last 168 hours No results found for this basename: AMMONIA,  in the last 168 hours CBC:  Recent Labs Lab 09/12/13 1401  WBC 8.6  NEUTROABS 5.7  HGB 14.9  HCT 43.0  MCV 85.8  PLT PLATELET CLUMPS NOTED ON SMEAR, COUNT APPEARS ADEQUATE   Cardiac Enzymes: No results found for this basename: CKTOTAL, CKMB, CKMBINDEX, TROPONINI,  in the last 168 hours  BNP (last 3 results) No results found for this basename: PROBNP,  in the last 8760 hours CBG: No results found for this basename: GLUCAP,  in the last 168 hours  Radiological Exams on Admission: Dg Hip Complete Left  09/12/2013   CLINICAL DATA:  Pain post fall 3 weeks ago  EXAM: LEFT HIP - COMPLETE 2+ VIEW  COMPARISON:  None.  FINDINGS: Three views of the left hip submitted. There is minimal displaced fracture of the left femoral neck. Atherosclerotic calcifications of femoral artery. There is spurring of greater femoral trochanter. Mild spurring of superior acetabulum.  IMPRESSION: Minimal displaced fracture of the left femoral neck.   Electronically Signed   By: Lahoma Crocker M.D.   On: 09/12/2013 11:49   Dg Knee Complete 4 Views Left  09/12/2013   CLINICAL DATA:  Left knee pain after fall.  EXAM: LEFT KNEE - COMPLETE 4+ VIEW   COMPARISON:  None.  FINDINGS: Status post left below-knee amputation. No acute fracture or dislocation is noted. No significant joint space narrowing is noted. No significant joint effusion is noted.  IMPRESSION: Status post below-knee amputation. No acute abnormality seen in the left knee.   Electronically Signed   By: Sabino Dick M.D.   On: 09/12/2013 12:16    EKG: Independently reviewed. Normal sinus rhythm with no ST elevations or depressions.  Assessment/Plan Active Problems:   Closed left hip fracture/Femur fracture, left - Ortho on board - Hip fx order set placed - pain control   Hyperkalemia - May represent hemolysis - repeat K level -  Normal saline - Telemetry  DM - SSI - will hold Lantus - CBG q 4 hours while npo  DVT prophylaxis - Heparin unless otherwise indicated by Ortho   Code Status: full Family Communication: discussed with patient and spouse Disposition Plan:  Cleared medically at moderate risk.   Time spent: > 55 minutes  Velvet Bathe [facility] Pager [phone]

## 2013-09-12 NOTE — ED Notes (Signed)
Pt fell on 02/28.  Pt thought he had muscle sprain so he didn't seek treatment at the time.  Pt went to [facility] today for further eval.  X-ray demonstrates L hip fracture.  Pt alert and oriented.

## 2013-09-12 NOTE — Anesthesia Preprocedure Evaluation (Addendum)
Anesthesia Evaluation  Patient identified by MRN, date of birth, ID band Patient awake    Reviewed: Allergy & Precautions, H&P , NPO status , Patient's Chart, lab work & pertinent test results, reviewed documented beta blocker date and time   History of Anesthesia Complications Negative for: history of anesthetic complications  Airway Mallampati: I TM Distance: >3 FB     Dental  (+) Poor Dentition, Loose,    Pulmonary shortness of breath, neg sleep apnea, neg COPDformer smoker,  breath sounds clear to auscultation        Cardiovascular Exercise Tolerance: Poor - angina+ Peripheral Vascular Disease - CHF Rhythm:Regular Rate:Normal     Neuro/Psych Right shoulder weakness x 3, resolved TIA Neuromuscular disease CVA, No Residual Symptoms negative psych ROS   GI/Hepatic negative GI ROS, Neg liver ROS,   Endo/Other  diabetes, Well Controlled, Type 2, Insulin Dependent  Renal/GU Renal InsufficiencyRenal disease     Musculoskeletal   Abdominal   Peds  Hematology negative hematology ROS (+)   Anesthesia Other Findings   Reproductive/Obstetrics                          Anesthesia Physical Anesthesia Plan  ASA: III and emergent  Anesthesia Plan: General   Post-op Pain Management:    Induction: Intravenous  Airway Management Planned: Oral ETT  Additional Equipment: None  Intra-op Plan:   Post-operative Plan: Extubation in OR  Informed Consent:   Dental advisory given  Plan Discussed with: CRNA and Surgeon  Anesthesia Plan Comments:        Anesthesia Quick Evaluation

## 2013-09-12 NOTE — ED Provider Notes (Signed)
CSN: [id number]     Arrival date & time 09/12/13  1242 History  This chart was scribed for non-physician practitioner Charlann Lange PA-C working with Juanda Crumble B. Karle Starch, MD by Rolanda Lundborg, ED Scribe. This patient was seen in room TR10C/TR10C and the patient's care was started at 1:03 PM.   Chief Complaint  Patient presents with  . Hip Injury   The history is provided by the patient. No language interpreter was used.   HPI Comments: Tyler Erickson is a 63 y.o. 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 Pt was sent here from Urgent Care after x-ray showed a hip fracture. He states he thinks he fell on his side although he cannot fully remember. He denies LOC, back pain, abdominal pain, incontinence. He states the pain has been gradually improving since 2/28 but he is still unable to ambulate. He has been taking Advil with good relief. He has not had anything to eat today because he has been waiting here.    Past Medical History  Diagnosis Date  . ADENOCARCINOMA, COLON     S/P surgery/chemotherapy  . DM   . NEUROPATHY   . Kidney stones   . Large bowel obstruction   . Ischemic colon   . 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 CVA (cerebral infarction) 07/29/2012    06/29/12  . Peripheral vascular disease   . Shortness of breath     with exertion  . Stroke     mini stoke January 2014  . Arthritis   . History of blood transfusion    Past Surgical History  Procedure Laterality Date  . Subtotal colectomy with ileostomy  06/27/2008  . Ileostomy closure  10/31/2009  . Porta-cath insertion  08/08/2008  . Amputation  06/30/2012    Procedure: AMPUTATION BELOW KNEE;  Surgeon: Elam Dutch, MD;  Location: [place];  Service: Vascular;  Laterality: Left;  . Intraoperative arteriogram  06/30/2012    Procedure: INTRA OPERATIVE ARTERIOGRAM;  Surgeon:  Elam Dutch, MD;  Location: [place];  Service: Vascular;  Laterality: Right;  . Aortogram  06/30/2012    Procedure: AORTOGRAM;  Surgeon: Elam Dutch, MD;  Location: [facility] OR;  Service: Vascular;  Laterality: Right;  Right upper extremity arch   . Tee without cardioversion  07/05/2012    Procedure: TRANSESOPHAGEAL ECHOCARDIOGRAM (TEE);  Surgeon: Thayer Headings, MD;  Location: [place];  Service: Cardiovascular;  Laterality: N/A;  . Porta catheter removed    . Incision and drainage of wound N/A 10/11/2012    Procedure: IRRIGATION AND DEBRIDEMENT OF RIGHT FOREARM AND LEFT ELBOW WITH POSSIBLE SURGICAL PREP WITH PLACEMENT OF ACELL AND VAC;  Surgeon: Theodoro Kos, DO;  Location: [place];  Service: Plastics;  Laterality: N/A;  IRRIGATION AND DEBRIDEMENT OF RIGHT FOREARM AND LEFT ELBOW WITH PLACEMENT OF ACELL AND VAC   Family History  Problem Relation Age of Onset  . Diabetes Mother   . Diabetes Father   . Heart failure Mother   . Heart failure Father   . Stroke Father   . Diabetes Sister   . Diabetes Sister   . Diabetes Brother    History  Substance Use Topics  . Smoking status: Former Smoker    Types: Cigars    Start date: 06/29/2012  . Smokeless tobacco: Never Used  Comment: last cigar a month ago  . Alcohol Use: No    Review of Systems  Gastrointestinal: Negative for abdominal pain.  Musculoskeletal: Negative for back pain.  Neurological: Negative for syncope.  All other systems reviewed and are negative.      Allergies  Review of patient's allergies indicates no known allergies.  Home Medications   Current Outpatient Rx  Name  Route  Sig  Dispense  Refill  . chlorhexidine (PERIDEX) 0.12 % solution   Mouth/Throat   Use as directed 15 mLs in the mouth or throat 2 (two) times daily.         . insulin aspart (NOVOLOG) 100 UNIT/ML injection   Subcutaneous   Inject 5 Units into the skin 3 (three) times daily before meals. If CBG above 150, give 5 units  subcutaneously 15 minutes before or 30 minutes after a meal and at bedtime.         . insulin glargine (LANTUS) 100 UNIT/ML injection   Subcutaneous   Inject 14 Units into the skin at bedtime.   10 mL   0   . metFORMIN (GLUCOPHAGE-XR) 500 MG 24 hr tablet   Oral   Take 1 tablet by mouth 2 (two) times daily.         . Multiple Vitamins-Minerals (DECUBI-VITE) CAPS   Oral   Take 1 capsule by mouth 2 (two) times daily.         . ondansetron (ZOFRAN) 4 MG/2ML SOLN injection   Intravenous   Inject 2 mLs (4 mg total) into the vein every 6 (six) hours as needed for nausea.   2 mL      . oxyCODONE (OXY IR/ROXICODONE) 5 MG immediate release tablet   Oral   Take 1-2 tablets (5-10 mg total) by mouth every 4 (four) hours as needed for pain.   20 tablet   0   . saccharomyces boulardii (FLORASTOR) 250 MG capsule   Oral   Take 1 capsule (250 mg total) by mouth 2 (two) times daily.         Marland Kitchen SANTYL ointment   Topical   Apply 1 application topically daily.         Marland Kitchen sulfamethoxazole-trimethoprim (BACTRIM DS) 800-160 MG per tablet   Oral   Take 1 tablet by mouth 2 (two) times daily.          BP 124/78  Pulse 80  Temp(Src) 98.4 F (36.9 C) (Oral)  Resp 20  Ht 5\' 8"  (1.727 m)  Wt 157 lb (71.215 kg)  BMI 23.88 kg/m2  SpO2 100% Physical Exam  Nursing note and vitals reviewed. Constitutional: He is oriented to person, place, and time. He appears well-developed and well-nourished. No distress.  HENT:  Head: Normocephalic and atraumatic.  Eyes: EOM are normal.  Neck: Neck supple. No tracheal deviation present.  Cardiovascular: Normal rate.   Pulmonary/Chest: Effort normal. No respiratory distress.  Abdominal: There is no tenderness.  Musculoskeletal: Normal range of motion.  Left BKA. Moderate tenderness over alteral and posterior hip on left without swelling. No bruising, no lumbar or paralumbar tenderness.   Neurological: He is alert and oriented to person, place, and  time.  Skin: Skin is warm and dry.  Psychiatric: He has a normal mood and affect. His behavior is normal.    ED Course  Procedures (including critical care time) Medications - No data to display  DIAGNOSTIC STUDIES: Oxygen Saturation is 100% on RA, normal by my interpretation.    COORDINATION  OF CARE: 1:16 PM- Discussed treatment plan with pt. Pt agrees to plan.    Labs Review Labs Reviewed - No data to display Imaging Review Dg Hip Complete Left  09/12/2013   CLINICAL DATA:  Pain post fall 3 weeks ago  EXAM: LEFT HIP - COMPLETE 2+ VIEW  COMPARISON:  None.  FINDINGS: Three views of the left hip submitted. There is minimal displaced fracture of the left femoral neck. Atherosclerotic calcifications of femoral artery. There is spurring of greater femoral trochanter. Mild spurring of superior acetabulum.  IMPRESSION: Minimal displaced fracture of the left femoral neck.   Electronically Signed   By: Lahoma Crocker M.D.   On: 09/12/2013 11:49   Dg Knee Complete 4 Views Left  09/12/2013   CLINICAL DATA:  Left knee pain after fall.  EXAM: LEFT KNEE - COMPLETE 4+ VIEW  COMPARISON:  None.  FINDINGS: Status post left below-knee amputation. No acute fracture or dislocation is noted. No significant joint space narrowing is noted. No significant joint effusion is noted.  IMPRESSION: Status post below-knee amputation. No acute abnormality seen in the left knee.   Electronically Signed   By: Sabino Dick M.D.   On: 09/12/2013 12:16     EKG Interpretation None      MDM   Final diagnoses:  None    1. Left femoral neck fracture  Discussed with Dr. Tonita Cong who will see patient in consult requesting hospitalist admission. He is comfortable. Blood studies, IV, pending.   I personally performed the services described in this documentation, which was scribed in my presence. The recorded information has been reviewed and is accurate.     Dewaine Oats, PA-C 09/12/13 1419

## 2013-09-12 NOTE — ED Notes (Signed)
Pt informed to ask for pain medication if he would like some, pt does not wish to have any at this time.

## 2013-09-12 NOTE — Anesthesia Procedure Notes (Signed)
Procedure Name: Intubation Date/Time: 09/12/2013 6:05 PM Performed by: Raphael Gibney T Pre-anesthesia Checklist: Patient identified, Timeout performed, Emergency Drugs available, Suction available and Patient being monitored Patient Re-evaluated:Patient Re-evaluated prior to inductionOxygen Delivery Method: Circle system utilized and Simple face mask Preoxygenation: Pre-oxygenation with 100% oxygen Intubation Type: IV induction Ventilation: Mask ventilation without difficulty Laryngoscope Size: Miller and 3 Grade View: Grade I Tube type: Oral Tube size: 7.5 mm Number of attempts: 1 Airway Equipment and Method: Patient positioned with wedge pillow and Stylet Placement Confirmation: ETT inserted through vocal cords under direct vision,  positive ETCO2 and breath sounds checked- equal and bilateral Secured at: 22 cm Tube secured with: Tape Dental Injury: Teeth and Oropharynx as per pre-operative assessment

## 2013-09-12 NOTE — Progress Notes (Signed)
Orthopedic Tech Progress Note Patient Details:  Tyler Erickson Mar 15, 1951 [id number]  Ortho Devices Ortho Device/Splint Location: trapeze bar patient helper Ortho Device/Splint Interventions: Ordered;Application   Aarik Blank 09/12/2013, 9:48 PM

## 2013-09-12 NOTE — Transfer of Care (Signed)
Immediate Anesthesia Transfer of Care Note  Patient: Tyler Erickson  Procedure(s) Performed: Procedure(s): CANNULATED HIP PINNING (Left)  Patient Location: PACU  Anesthesia Type:General  Level of Consciousness: oriented, sedated, patient cooperative and responds to stimulation  Airway & Oxygen Therapy: Patient Spontanous Breathing and Patient connected to nasal cannula oxygen  Post-op Assessment: Report given to PACU RN, Post -op Vital signs reviewed and stable, Patient moving all extremities and Patient moving all extremities X 4  Post vital signs: Reviewed and stable  Complications: No apparent anesthesia complications

## 2013-09-12 NOTE — Progress Notes (Signed)
Orthopedic Tech Progress Note Patient Details:  Tyler Erickson [birth year] [id number]  Ortho Devices Ortho Device/Splint Location: trapeze bar patient helper Ortho Device/Splint Interventions: Ordered;Application   [name] 09/12/2013, 9:49 PM

## 2013-09-12 NOTE — Preoperative (Addendum)
Beta Blockers   Reason not to administer Beta Blockers:Not Applicable 

## 2013-09-12 NOTE — ED Notes (Signed)
Pt transported to OR

## 2013-09-12 NOTE — Progress Notes (Signed)
Report given to [place] RN

## 2013-09-12 NOTE — Anesthesia Postprocedure Evaluation (Signed)
  Anesthesia Post-op Note  Patient: Tyler Erickson  Procedure(s) Performed: Procedure(s): CANNULATED HIP PINNING (Left)  Patient Location: PACU  Anesthesia Type:General  Level of Consciousness: awake and alert   Airway and Oxygen Therapy: Patient Spontanous Breathing  Post-op Pain: mild  Post-op Assessment: Post-op Vital signs reviewed and Patient's Cardiovascular Status Stable  Post-op Vital Signs: stable  Complications: No apparent anesthesia complications

## 2013-09-12 NOTE — Progress Notes (Signed)
Orthopedic Tech Progress Note Patient Details:  Tyler Erickson 1951/02/20 [id number]  Ortho Devices Ortho Device/Splint Location: [facility] PATIENT [name], Tyler Erickson 09/12/2013, 8:27 PM

## 2013-09-12 NOTE — Brief Op Note (Signed)
09/12/2013  7:09 PM  PATIENT:  Rhea Belton  63 y.o. male  PRE-OPERATIVE DIAGNOSIS:  Left Hip Fracture  POST-OPERATIVE DIAGNOSIS:  Left Hip Fracture  PROCEDURE:  Procedure(s): CANNULATED HIP PINNING (Left)  SURGEON:  Surgeon(s) and Role:    * Johnn Hai, MD - Primary  PHYSICIAN ASSISTANT:   ASSISTANTS: none   ANESTHESIA:   general  EBL:     BLOOD ADMINISTERED:none  DRAINS: none   LOCAL MEDICATIONS USED:  MARCAINE     SPECIMEN:  No Specimen  DISPOSITION OF SPECIMEN:  N/A  COUNTS:  YES  TOURNIQUET:  * No tourniquets in log *  DICTATION: .Other Dictation: Dictation Number [id number]  PLAN OF CARE: Admit to inpatient   PATIENT DISPOSITION:  PACU - hemodynamically stable.   Delay start of Pharmacological VTE agent (>24hrs) due to surgical blood loss or risk of bleeding: no

## 2013-09-13 DIAGNOSIS — N179 Acute kidney failure, unspecified: Secondary | ICD-10-CM

## 2013-09-13 DIAGNOSIS — E875 Hyperkalemia: Secondary | ICD-10-CM

## 2013-09-13 DIAGNOSIS — E119 Type 2 diabetes mellitus without complications: Secondary | ICD-10-CM

## 2013-09-13 LAB — CBC
HCT: 40.4 % (ref 39.0–52.0)
HEMOGLOBIN: 14 g/dL (ref 13.0–17.0)
MCH: 29.5 pg (ref 26.0–34.0)
MCHC: 34.7 g/dL (ref 30.0–36.0)
MCV: 85.1 fL (ref 78.0–100.0)
Platelets: 172 10*3/uL (ref 150–400)
RBC: 4.75 MIL/uL (ref 4.22–5.81)
RDW: 12.8 % (ref 11.5–15.5)
WBC: 9.7 10*3/uL (ref 4.0–10.5)

## 2013-09-13 LAB — BASIC METABOLIC PANEL
BUN: 20 mg/dL (ref 6–23)
CO2: 24 mEq/L (ref 19–32)
CREATININE: 1.19 mg/dL (ref 0.50–1.35)
Calcium: 8.8 mg/dL (ref 8.4–10.5)
Chloride: 99 mEq/L (ref 96–112)
GFR, EST AFRICAN AMERICAN: 74 mL/min — AB (ref >90)
GFR, EST NON AFRICAN AMERICAN: 64 mL/min — AB (ref >90)
Glucose, Bld: 125 mg/dL — ABNORMAL HIGH (ref 70–99)
Potassium: 5.3 mEq/L (ref 3.7–5.3)
Sodium: 138 mEq/L (ref 137–147)

## 2013-09-13 LAB — GLUCOSE, CAPILLARY
GLUCOSE-CAPILLARY: 123 mg/dL — AB (ref 70–99)
GLUCOSE-CAPILLARY: 125 mg/dL — AB (ref 70–99)
GLUCOSE-CAPILLARY: 165 mg/dL — AB (ref 70–99)
Glucose-Capillary: 163 mg/dL — ABNORMAL HIGH (ref 70–99)
Glucose-Capillary: 186 mg/dL — ABNORMAL HIGH (ref 70–99)

## 2013-09-13 MED ORDER — ENOXAPARIN SODIUM 40 MG/0.4ML ~~LOC~~ SOLN
40.0000 mg | SUBCUTANEOUS | Status: DC
  Administered 2013-09-13: 40 mg via SUBCUTANEOUS
  Filled 2013-09-13 (×2): qty 0.4

## 2013-09-13 NOTE — Progress Notes (Signed)
Utilization review completed.  

## 2013-09-13 NOTE — Progress Notes (Addendum)
Clinical Social Work Department CLINICAL SOCIAL WORK PLACEMENT NOTE 09/13/2013  Patient:  Tyler Erickson, Tyler Erickson  Account Number:  [id number] [place] date:  09/12/2013  Clinical Social Worker:  Berton Mount, Latanya Presser  Date/time:  09/13/2013 02:45 PM  Clinical Social Work is seeking post-discharge placement for this patient at the following level of care:   [place]   (*CSW will update this form in Epic as items are completed)   09/13/2013  Patient/family provided with [place] Department of Clinical Social Work's list of facilities offering this level of care within the geographic area requested by the patient (or if unable, by the patient's family).  09/13/2013  Patient/family informed of their freedom to choose among providers that offer the needed level of care, that participate in Medicare, Medicaid or managed care program needed by the patient, have an available bed and are willing to accept the patient.  09/13/2013  Patient/family informed of [facility]' ownership interest in [facility], as well as of the fact that they are under no obligation to receive care at this facility.  PASARR submitted to E[employer] on  PASARR number received from [place] on   FL2 transmitted to all facilities in geographic area requested by pt/family on  09/13/2013 FL2 transmitted to all facilities within larger geographic area on   Patient informed that his/her managed care company has contracts with or will negotiate with  certain facilities, including the following:     Patient/family informed of bed offers received:  09/14/2013 Patient chooses bed at [facility]  Physician recommends and patient chooses bed at    Patient to be transferred to [facility]  on  09/14/2013 Patient to be transferred to facility by [facility]  The following physician request were entered in Epic:   Additional Comments:   [place], [place]

## 2013-09-13 NOTE — Progress Notes (Addendum)
PROGRESS NOTE  Tyler Erickson ENI:[id number] DOB: 1950/11/16 DOA: 09/12/2013 PCP: Donnie Coffin, MD  Assessment/Plan: Closed left hip fracture/Femur fracture, left  - Ortho on board  - s/p surgery - pain control  -Pt/OT  Hyperkalemia  - resolved  DM  - SSI  - will hold Lantus  - change to QID -hold orals while in house  AKI -better with  IVF  Code Status: full Family Communication: patient Disposition Plan: await PT   Consultants:  ortho  Procedures:      HPI/Subjective: Patient's pain control Agreeable for SNF if needed- from home  Objective: Filed Vitals:   09/13/13 0436  BP: 158/81  Pulse: 81  Temp: 97.7 F (36.5 C)  Resp: 16    Intake/Output Summary (Last 24 hours) at 09/13/13 0911 Last data filed at 09/13/13 0700  Gross per 24 hour  Intake   1315 ml  Output   1250 ml  Net     65 ml   Filed Weights   09/12/13 1245  Weight: 71.215 kg (157 lb)    Exam:  General: Appears calm and comfortable  Cardiovascular: RRR, no m/r/g. No LE edema.  Respiratory: CTA bilaterally, no w/r/r. Normal respiratory effort.  Abdomen: soft, ntnd  Left BKA   Data Reviewed: Basic Metabolic Panel:  Recent Labs Lab 09/12/13 1401 09/12/13 1553 09/13/13 0532  NA 139  --  138  K 5.6* 4.7 5.3  CL 100  --  99  CO2 24  --  24  GLUCOSE 129*  --  125*  BUN 27*  --  20  CREATININE 1.46*  --  1.19  CALCIUM 9.4  --  8.8   Liver Function Tests:  Recent Labs Lab 09/12/13 1401  AST 20  ALT 14  ALKPHOS 140*  BILITOT 0.3  PROT 7.9  ALBUMIN 4.1   No results found for this basename: LIPASE, AMYLASE,  in the last 168 hours No results found for this basename: AMMONIA,  in the last 168 hours CBC:  Recent Labs Lab 09/12/13 1401 09/13/13 0532  WBC 8.6 9.7  NEUTROABS 5.7  --   HGB 14.9 14.0  HCT 43.0 40.4  MCV 85.8 85.1  PLT PLATELET CLUMPS NOTED ON SMEAR, COUNT APPEARS ADEQUATE 172   Cardiac Enzymes: No results found for this basename: CKTOTAL,  CKMB, CKMBINDEX, TROPONINI,  in the last 168 hours BNP (last 3 results) No results found for this basename: PROBNP,  in the last 8760 hours CBG:  Recent Labs Lab 09/12/13 1702 09/12/13 1938 09/12/13 2354 09/13/13 0356 09/13/13 0635  GLUCAP 122* 133* 132* 123* 125*    No results found for this or any previous visit (from the past 240 hour(s)).   Studies: Dg Hip Complete Left  09/12/2013   CLINICAL DATA:  Pain post fall 3 weeks ago  EXAM: LEFT HIP - COMPLETE 2+ VIEW  COMPARISON:  None.  FINDINGS: Three views of the left hip submitted. There is minimal displaced fracture of the left femoral neck. Atherosclerotic calcifications of femoral artery. There is spurring of greater femoral trochanter. Mild spurring of superior acetabulum.  IMPRESSION: Minimal displaced fracture of the left femoral neck.   Electronically Signed   By: Lahoma Crocker M.D.   On: 09/12/2013 11:49   Dg Hip Operative Left  09/12/2013   CLINICAL DATA:  Left hip ORIF.  EXAM: OPERATIVE LEFT HIP  COMPARISON:  Radiographs 09/12/2013.  FINDINGS: Two spot fluoroscopic images demonstrate internal fixation of the left femoral neck fracture with 3 Bayard Males  pins. The hardware appears well positioned. No complications are identified.  IMPRESSION: Left hip fracture fixation without demonstrated complication.   Electronically Signed   By: Camie Patience M.D.   On: 09/12/2013 19:45   Dg Pelvis Portable  09/12/2013   CLINICAL DATA:  Status post left hip fracture fixation.  EXAM: PORTABLE PELVIS 1-2 VIEWS  COMPARISON:  Plain films left hip 09/12/2013 at 11:30 a.m.  FINDINGS: Three screws of in place for fixation of a basicervical left femoral neck fracture. Hardware is intact. Position and alignment are near anatomic. There is some gas in the soft tissues. Surgical staples are noted. Atherosclerosis is identified. 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  IMPRESSION: ORIF left hip fracture without complicating feature.   Electronically Signed   By:  Inge Rise M.D.   On: 09/12/2013 21:23   Dg Knee Complete 4 Views Left  09/12/2013   CLINICAL DATA:  Left knee pain after fall.  EXAM: LEFT KNEE - COMPLETE 4+ VIEW  COMPARISON:  None.  FINDINGS: Status post left below-knee amputation. No acute fracture or dislocation is noted. No significant joint space narrowing is noted. No significant joint effusion is noted.  IMPRESSION: Status post below-knee amputation. No acute abnormality seen in the left knee.   Electronically Signed   By: Sabino Dick M.D.   On: 09/12/2013 12:16    Scheduled Meds: . clindamycin (CLEOCIN) IV  900 mg Intravenous Once  . heparin  5,000 Units Subcutaneous 3 times per day  . insulin aspart  0-15 Units Subcutaneous TID WC   Continuous Infusions: . sodium chloride 100 mL/hr at 09/13/13 0003   Antibiotics Given (last 72 hours)   Date/Time Action Medication Dose Rate   09/12/13 2106 Given   clindamycin (CLEOCIN) IVPB 600 mg 600 mg 100 mL/hr   09/13/13 0002 Given   ceFAZolin (ANCEF) IVPB 2 g/50 mL premix 2 g 100 mL/hr   09/13/13 3790 Given   ceFAZolin (ANCEF) IVPB 2 g/50 mL premix 2 g 100 mL/hr      Active Problems:   DM   Closed left hip fracture   Femur fracture, left   Hyperkalemia    Time spent: 35 min    VANN, [facility] Pager [phone]. If 7PM-7AM, please contact night-coverage at [email or web address], password [facility] 09/13/2013, 9:11 AM  LOS: 1 day

## 2013-09-13 NOTE — Evaluation (Signed)
Physical Therapy Evaluation Patient Details Name: PAULA ZIETZ MRN: [id number] DOB: 1950-12-17 Today's Date: 09/13/2013 Time: 1050-1120 PT Time Calculation (min): 30 min  PT Assessment / Plan / Recommendation History of Present Illness  Pt is a 63 y/o male with a PMH of CVA, and L BKA with prosthesis. Pt fell on ice ~3 weeks ago sustaining a L femur fracture. Pt is now s/p ORIF on the L.   Clinical Impression  This patient presents with acute pain and decreased functional independence following the above mentioned procedure. At the time of PT eval, pt was able to perform some ambulation with platform walker and prosthesis doffed. This patient is appropriate for skilled PT interventions to address functional limitations, improve safety and independence with functional mobility, and return to PLOF.     PT Assessment  Patient needs continued PT services    Follow Up Recommendations  SNF;Supervision for mobility/OOB    Does the patient have the potential to tolerate intense rehabilitation      Barriers to Discharge        Equipment Recommendations  None recommended by PT    Recommendations for Other Services     Frequency Min 3X/week    Precautions / Restrictions Precautions Precautions: Fall Restrictions Weight Bearing Restrictions: Yes Other Position/Activity Restrictions: Awaiting clarification of TDWB vs. PWB status.    Pertinent Vitals/Pain Pt reports 5/10 pain during ambulation.       Mobility  Bed Mobility Overal bed mobility: Needs Assistance Bed Mobility: Supine to Sit Supine to sit: Min assist;HOB elevated General bed mobility comments: Increased time to complete transfer to EOB. Exited bed on R side, with assist for trunk elevation to full sitting.  Transfers Overall transfer level: Needs assistance Equipment used: Right platform walker Transfers: Sit to/from Stand Sit to Stand: Min assist General transfer comment: VC's for hand placement on seated  surface for safety. Assist to power up to full standing.  Ambulation/Gait Ambulation/Gait assistance: Min guard Ambulation Distance (Feet): 35 Feet Assistive device: Right platform walker Gait Pattern/deviations:  (Hop-to pattern - no prosthesis) Gait velocity: Decreased Gait velocity interpretation: Below normal speed for age/gender General Gait Details: VC's for sequencing and safety awareness.     Exercises     PT Diagnosis: Difficulty walking;Acute pain  PT Problem List: Decreased strength;Decreased range of motion;Decreased activity tolerance;Decreased balance;Decreased mobility;Decreased knowledge of use of DME;Decreased safety awareness;Decreased knowledge of precautions;Pain PT Treatment Interventions: DME instruction;Gait training;Stair training;Functional mobility training;Therapeutic activities;Therapeutic exercise;Neuromuscular re-education;Patient/family education     PT Goals(Current goals can be found in the care plan section) Acute Rehab PT Goals Patient Stated Goal: To walk with prosthesis again PT Goal Formulation: With patient Time For Goal Achievement: 09/27/13 Potential to Achieve Goals: Good  Visit Information  Last PT Received On: 09/13/13 Assistance Needed: +2 (chair follow) History of Present Illness: Pt is a 63 y/o male with a PMH of CVA, and L BKA with prosthesis. Pt fell on ice ~3 weeks ago sustaining a L femur fracture. Pt is now s/p ORIF on the L.        Prior [place] expects to be discharged to:: Skilled nursing facility Living Arrangements: Other relatives Additional Comments: Brother lives with pt. Would be available to help pt at home if necessary. Prior Function Level of Independence: Independent Comments: Walker with R platform at home.  Communication Communication: No difficulties Dominant Hand: Left (Was R handed but due to CVA weakness now L handed)    Cognition  Cognition  Arousal/Alertness:  Awake/alert Behavior During Therapy: WFL for tasks assessed/performed Overall Cognitive Status: Within Functional Limits for tasks assessed    Extremity/Trunk Assessment Upper Extremity Assessment Upper Extremity Assessment: RUE deficits/detail RUE Deficits / Details: Residual weakness from CVA. Uses platform walker. Lower Extremity Assessment Lower Extremity Assessment: LLE deficits/detail LLE Deficits / Details: Decreased strength and AROM consistent with cannulated hip pinning.  Cervical / Trunk Assessment Cervical / Trunk Assessment: Normal   Balance Balance Overall balance assessment: Needs assistance Sitting-balance support: Feet supported Sitting balance-Leahy Scale: Fair Postural control: Right lateral lean;Posterior lean Standing balance support: Bilateral upper extremity supported Standing balance-Leahy Scale: Poor  End of Session PT - End of Session Equipment Utilized During Treatment: Gait belt Activity Tolerance: Patient tolerated treatment well Patient left: in chair;with call bell/phone within reach Nurse Communication: Mobility status  GP     Jolyn Lent 09/13/2013, 12:23 PM  Jolyn Lent, PT, DPT Acute Rehabilitation Services Pager: [phone]

## 2013-09-13 NOTE — Progress Notes (Signed)
Brief Nutrition Note   RD consult received per hip fracture protocol   Ht: 5'8" Wt: 71.4 kg  BMI: 25.5 (adjusted for left BKA) Diet order: Carb modified   Lab Results  Component Value Date   HGBA1C 10.9* 07/05/2012    Pt is a 63 y.o male with a history of DM. Pt stopped taking medication because he "was feeling fine" without them and has recently started back on medications a couple of weeks ago. Pt has not had any weight loss recently, has a good appetite, and no other wounds. Pt usually eats frozen dinners, and canned foods such as chili and stew. Pt explained his limited finances and does not have access to buy a lot of foods. Pt was not interested in any education on his diet. Pt agreed and usually drinks diet sodas as well as only uses pepper to season foods. Pt does not use any added salt to season his food.  While talking with patient, emphasized the high sodium content of frozen dinners, and to limit sugary snacks and beverages. Explained to patient that protein intake is important for the healing of his hip fracture and to include protein foods at each snack and meal.   Labs and medications reviewed.  No interventions are needed at this time.   Tyler Erickson, [facility]  Nutrition Intern   Intern note/chart reviewed. Revisions made.  [place], [place], [place] Pager [phone] After Hours Pager

## 2013-09-13 NOTE — Progress Notes (Signed)
Subjective: 1 Day Post-Op Procedure(s) (LRB): CANNULATED HIP PINNING (Left) Patient reports pain as mild.  Reports mild incisional pain and proximal thigh pain. No numbness or tingling. No CP, SOB. No other c/o this AM.  Objective: Vital signs in last 24 hours: Temp:  [97.4 F (36.3 C)-98.4 F (36.9 C)] 97.7 F (36.5 C) (03/19 0436) Pulse Rate:  [56-85] 81 (03/19 0436) Resp:  [12-20] 16 (03/19 0436) BP: (124-161)/(43-88) 158/81 mmHg (03/19 0436) SpO2:  [97 %-100 %] 98 % (03/19 0436) Weight:  [71.215 kg (157 lb)] 71.215 kg (157 lb) (03/18 1245)  Intake/Output from previous day: 03/18 0701 - 03/19 0700 In: 1075 [I.V.:850] Out: 1250 [Urine:1200; Blood:50] Intake/Output this shift: Total I/O In: 525 [I.V.:300; Other:225] Out: 1000 [Urine:1000]   Recent Labs  09/12/13 1401  HGB 14.9    Recent Labs  09/12/13 1401  WBC 8.6  RBC 5.01  HCT 43.0  PLT PLATELET CLUMPS NOTED ON SMEAR, COUNT APPEARS ADEQUATE    Recent Labs  09/12/13 1401 09/12/13 1553  NA 139  --   K 5.6* 4.7  CL 100  --   CO2 24  --   BUN 27*  --   CREATININE 1.46*  --   GLUCOSE 129*  --   CALCIUM 9.4  --    No results found for this basename: LABPT, INR,  in the last 72 hours  Neurologically intact ABD soft Neurovascular intact Sensation intact distally Intact pulses distally Dorsiflexion/Plantar flexion intact Incision: dressing C/D/I and no drainage No cellulitis present Compartment soft no sign of DVT  Assessment/Plan: 1 Day Post-Op Procedure(s) (LRB): CANNULATED HIP PINNING (Left) Advance diet Up with therapy D/C IV fluids Start PT, touchdown wt bearing LLE Lovenox for DVT ppx, 40mg  subQ daily Glycemic control, sliding scale ordered Incentive spirometry Plan for D/C per medical team when stable Will discuss with Dr. Mliss Fritz, [place]. 09/13/2013, 6:50 AM

## 2013-09-13 NOTE — Progress Notes (Signed)
Clinical Social Work Department BRIEF PSYCHOSOCIAL ASSESSMENT 09/13/2013  Patient:  Tyler Erickson, Tyler Erickson     Account Number:  [id number]     [place] date:  09/12/2013  Clinical Social Worker:  Adair Laundry  Date/Time:  09/13/2013 02:30 PM  Referred by:  Physician  Date Referred:  09/13/2013 Referred for  SNF Placement   Other Referral:   Interview type:  Patient Other interview type:   Spoke with pt and pt sister at bedside    PSYCHOSOCIAL DATA Living Status:  FAMILY Admitted from facility:   Level of care:   Primary support name:  Jerelene Redden [phone] Primary support relationship to patient:  [place] Degree of support available:   Pt has supportive family    CURRENT CONCERNS Current Concerns  Post-Acute Placement   Other Concerns:    SOCIAL WORK ASSESSMENT / PLAN CSW spoke with pt in room about PT recommendation for SNF. Pt sister also present during conversation. Pt reported he was unaware of recommendation but not surprised. Pt has been to [facility] in the past and he would like to return. CSW to speak with facility to confirm if they can have pt back. If not, CSW to refer out to all of [place]. Pt agreeable to this and had no concerns.   Assessment/plan status:  Psychosocial Support/Ongoing Assessment of Needs Other assessment/ plan:   Information/referral to community resources:   SNF list to be provided if needed    PATIENT'S/FAMILY'S RESPONSE TO PLAN OF CARE: Pt agreeable to SNF       [place], [place]

## 2013-09-13 NOTE — Op Note (Signed)
NAMEMarland Kitchen  Tyler Erickson, Tyler Erickson NO.:  [id number]  MEDICAL RECORD NO.:  [id number]  LOCATION:  5N30C                        FACILITY:  [facility]  PHYSICIAN:  Susa Day, M.D.    DATE OF BIRTH:  04/09/1951  DATE OF PROCEDURE:  09/12/2013 DATE OF DISCHARGE:                              OPERATIVE REPORT   PREOPERATIVE DIAGNOSIS:  Left femoral neck fracture.  POSTOPERATIVE DIAGNOSIS:  Left femoral neck fracture.  PROCEDURE PERFORMED:  Open reduction and internal fixation, left femoral neck.  ANESTHESIA:  General.  ASSISTANT:  No assistant.  HISTORY:  A 63 year old, sustained a fall 2 weeks ago, was having pain in the groin, seen in the emergency room, diagnosed with nondisplaced femoral neck fracture, indicated for open reduction and internal fixation.  Risks and benefits discussed including bleeding, infection, malunion, nonunion, need for revision, total hip, etc.  TECHNIQUE:  With the patient in a supine position, after the induction of adequate general anesthesia, 2 g Kefzol.  He has history of diabetes. He was placed on the fracture table.  He had a BKA, so we had to address the left lower extremity and place it into the fracture apparatus, this was done satisfactorily.  No traction was applied.  It was in the gentle internal position.  It was nondisplaced.  Well leg in gentle flexion and external rotation.  Perineal post well padded, Foley to gravity.  Left hip was prepped and draped in usual sterile fashion.  Under x-ray, we made incision over the trochanter, subcutaneous tissue was dissected. Electrocautery was utilized to achieve hemostasis.  The fascia was divided in line with skin incision.  The lateral aspect of the trochanter was identified, we placed 3 pins up in the femoral head and neck using the parallel guide in a triangular fashion.  I inserted 3 fully-threaded screws, 85, 85, and 80 mm in length titanium with excellent purchase.  This was near the  subchondral bone, we rotated the femur, internally, externally, AP, and lateral and demonstrated no violation of the femoral head.  Initially we had a slightly longer screw caudad and we changed an 85 for an 80.  This was satisfactory.  We copiously irrigated the wound, closed the fascia with 1-Vicryl, subcu with 2-0, skin with staples.  Wound was dressed sterilely.  Removed from the fracture table.  Extubated without difficulty and transported to the recovery room in satisfactory condition.  The patient tolerated the procedure well.  No complications.  No assistant.  Blood loss 30 mL.    Susa Day, M.D.    Geralynn Rile  D:  09/12/2013  T:  09/13/2013  Job:  [id number]

## 2013-09-14 ENCOUNTER — Encounter (HOSPITAL_COMMUNITY): Payer: Self-pay | Admitting: Specialist

## 2013-09-14 LAB — CBC
HCT: 35.2 % — ABNORMAL LOW (ref 39.0–52.0)
Hemoglobin: 12 g/dL — ABNORMAL LOW (ref 13.0–17.0)
MCH: 29.3 pg (ref 26.0–34.0)
MCHC: 34.1 g/dL (ref 30.0–36.0)
MCV: 85.9 fL (ref 78.0–100.0)
PLATELETS: 155 10*3/uL (ref 150–400)
RBC: 4.1 MIL/uL — ABNORMAL LOW (ref 4.22–5.81)
RDW: 13 % (ref 11.5–15.5)
WBC: 8 10*3/uL (ref 4.0–10.5)

## 2013-09-14 LAB — BASIC METABOLIC PANEL
BUN: 19 mg/dL (ref 6–23)
CHLORIDE: 102 meq/L (ref 96–112)
CO2: 25 mEq/L (ref 19–32)
Calcium: 8.9 mg/dL (ref 8.4–10.5)
Creatinine, Ser: 1.38 mg/dL — ABNORMAL HIGH (ref 0.50–1.35)
GFR, EST AFRICAN AMERICAN: 62 mL/min — AB (ref >90)
GFR, EST NON AFRICAN AMERICAN: 54 mL/min — AB (ref >90)
Glucose, Bld: 116 mg/dL — ABNORMAL HIGH (ref 70–99)
POTASSIUM: 4.1 meq/L (ref 3.7–5.3)
Sodium: 140 mEq/L (ref 137–147)

## 2013-09-14 LAB — GLUCOSE, CAPILLARY
Glucose-Capillary: 118 mg/dL — ABNORMAL HIGH (ref 70–99)
Glucose-Capillary: 154 mg/dL — ABNORMAL HIGH (ref 70–99)

## 2013-09-14 MED ORDER — BISACODYL 10 MG RE SUPP: 10.0000 mg | RECTAL | Status: AC | PRN

## 2013-09-14 MED ORDER — ONDANSETRON HCL 4 MG PO TABS: 4.0000 mg | ORAL_TABLET | Freq: Four times a day (QID) | ORAL | Status: AC | PRN

## 2013-09-14 MED ORDER — ACETAMINOPHEN 325 MG PO TABS: 650.0000 mg | ORAL_TABLET | Freq: Four times a day (QID) | ORAL | Status: AC | PRN

## 2013-09-14 MED ORDER — ENOXAPARIN SODIUM 40 MG/0.4ML ~~LOC~~ SOLN
40.0000 mg | SUBCUTANEOUS | Status: AC
Start: 2013-09-14 — End: ?

## 2013-09-14 MED ORDER — SENNA 8.6 MG PO TABS: 2.0000 | ORAL_TABLET | Freq: Every day | ORAL | Status: AC | PRN

## 2013-09-14 NOTE — ED Provider Notes (Signed)
Medical screening examination/treatment/procedure(s) were performed by a resident physician or non-physician practitioner and as the supervising physician I was immediately available for consultation/collaboration.  Ward Boissonneault, MD    Ersie Savino S Cintya Daughety, MD 09/14/13 1551 

## 2013-09-14 NOTE — Progress Notes (Signed)
CSW (Clinical Education officer, museum) working with pt on [employer]. Pt has chosen [facility] as first choice out of bed offers. CSW left message for facility to inquire about bed availability. CSW spoke with MD about anticipated dc plan. MD unsure at this time but asked CSW to touch base with Ortho MD to inquire when pt will be ready from their standpoint. CSW called office and left message for ortho PA.   Tyler Erickson, Tyler Erickson

## 2013-09-14 NOTE — Progress Notes (Signed)
Subjective: Doing well.  Hip pain controlled.  No complaints.     Objective: Vital signs in last 24 hours: Temp:  [98.3 F (36.8 C)-99 F (37.2 C)] 98.4 F (36.9 C) (03/20 0659) Pulse Rate:  [68-99] 68 (03/20 0659) Resp:  [16-18] 18 (03/20 0733) BP: (108-114)/(55-57) 114/57 mmHg (03/20 0659) SpO2:  [97 %-100 %] 99 % (03/20 0659)  Intake/Output from previous day: 03/19 0701 - 03/20 0700 In: 240 [P.O.:240] Out: 525 [Urine:525] Intake/Output this shift:     Recent Labs  09/12/13 1401 09/13/13 0532 09/14/13 0350  HGB 14.9 14.0 12.0*    Recent Labs  09/13/13 0532 09/14/13 0350  WBC 9.7 8.0  RBC 4.75 4.10*  HCT 40.4 35.2*  PLT 172 155    Recent Labs  09/13/13 0532 09/14/13 0350  NA 138 140  K 5.3 4.1  CL 99 102  CO2 24 25  BUN 20 19  CREATININE 1.19 1.38*  GLUCOSE 125* 116*  CALCIUM 8.8 8.9   No results found for this basename: LABPT, INR,  in the last 72 hours  Exam:  Dressing c/d/i.    Assessment/Plan: Has bed available at [facility].  Cleared by medicine.  Ok to transfer today. from ortho standpoint.  Needs rov with dr beane 2 weeks postop.     Kayloni Rocco M 09/14/2013, 12:49 PM

## 2013-09-14 NOTE — Progress Notes (Signed)
Report called to receiving RN at [facility] -transport via ambulance;  In stable condition.

## 2013-09-14 NOTE — Progress Notes (Signed)
OT Cancellation and Discharge Note  Patient Details Name: Tyler Erickson MRN: [id number] DOB: Aug 24, 1950   Cancelled Treatment:    Reason Eval/Treat Not Completed: Patient declined, no reason specified. OT made visit this date to assess ADL performance. Pt stated that he is able to perform self-care independently and declined working with OT. Pt stated that he would continue to work when he goes to "rehab" so he could return home as soon as possible. Acute OT to sign off.   Tyler Erickson [phone] 09/14/2013, 1:44 PM

## 2013-09-14 NOTE — Progress Notes (Addendum)
CSW (Clinical Education officer, museum) prepared pt dc packet and placed with shadow chart. CSW will arrange non-emergent ambulance transport once facility calls to confirm family has completed paperwork. Pt, pt family, pt nurse, and facility informed. CSW also informed insurance and received Josem Kaufmann #[phone]. CSW signing off.  ADDENDUM 3:10pm: CSW received confirmation call from facility and arranged non-emergent ambulance  [place], [place]

## 2013-09-14 NOTE — Care Management Note (Addendum)
CARE MANAGEMENT NOTE 09/14/2013  Patient:  Tyler Erickson, Tyler Erickson   Account Number:  [id number]  Date Initiated:  09/14/2013  Documentation initiated by:  Ricki Miller  Subjective/Objective Assessment:   63 yr old male s/p left hip fracture with ORIF. Patient has Left BKA.     Action/Plan:   Patient will need shortterm rehab at [facility],  Social worker is aware.   Anticipated DC Date:  09/15/2013   Anticipated DC Plan:  SKILLED NURSING FACILITY  In-house referral  Clinical Social Worker      DC Planning Services  CM consult      Choice offered to / List presented to:             Status of service:  Completed, signed off Medicare Important Message given?   (If response is "NO", the following Medicare IM given date fields will be blank) Date Medicare IM given:   Date Additional Medicare IM given:    Discharge Disposition:  [place]  Per UR Regulation:

## 2013-09-14 NOTE — Discharge Summary (Signed)
Physician Discharge Summary  Tyler Erickson:[id number] DOB: 1950/07/25 DOA: 09/12/2013  PCP: Donnie Coffin, MD  Admit date: 09/12/2013 Discharge date: 09/14/2013  Time spent: greater than 30 min  Recommendations for Outpatient Follow-up:  1. Monitor blood glucose qac and hs  Discharge Diagnoses:    Femur fracture, left, s/p ORIF by Dr. Tonita Cong 09/12/13 Active Problems:   DM 2   Closed left hip fracture Left BKA   Hyperkalemia  Discharge Condition: stable  Filed Weights   09/12/13 1245  Weight: 71.215 kg (157 lb)    History of present illness:  63 year old Caucasian male with history of diabetes mellitus, history of left BKA by Dr. Oneida Alar. He states that he has had pain since the fall on 08/25/2013. The fall was on the left side. He states that he has had muscle aches and sores at his left leg since. Given his persistent symptoms he presented to the ed for further evaluation and recommendations. He denies any fevers or chills.  In the ED patient was found to have a fractured L femur and we were subsequently called for further evaluation and recommendations.   Hospital Course:  Admitted to hospitalists. Orthopedics consulted. Had uneventful ORIF on the 18th. No problems postoperatively. Preop, had a potassium of 5.6, likely lab error, as repeat was 4.7. Patient has worked with physical therapy who recommend skilled nursing placement. Patient has been on Lovenox for DVT prophylaxis and may continue this for another month. He may discharge to skilled nursing facility today if okay with Dr. Tonita Cong.  Procedures: ORIF, Dr. Tonita Cong 09/12/13  Consultations:  orthopedics  Discharge Exam: Filed Vitals:   09/14/13 0733  BP:   Pulse:   Temp:   Resp: 18    General: alert, oriented. Comfortable in chair Cardiovascular: RRR Respiratory: CTA Ext left BKA. Dressing CDI  Discharge Instructions  Discharge Orders   Future Appointments Provider Department Dept Phone   03/28/2014 11:00  AM [facility] [place] [facility] [address] [phone]   03/28/2014 11:30 AM [facility] [address] [phone]   03/28/2014 12:00 PM Sharmon Leyden Nickel, NP [facility] [phone]   Future Orders Complete By Expires   Diet Carb Modified  As directed    Touch down weight bearing  As directed    Questions:     Laterality:  left   Extremity:         Medication List         acetaminophen 325 MG tablet  Commonly known as:  TYLENOL  Take 2 tablets (650 mg total) by mouth every 6 (six) hours as needed for mild pain (or Fever >/= 101).     bisacodyl 10 MG suppository  Commonly known as:  DULCOLAX  Place 1 suppository (10 mg total) rectally as needed for moderate constipation.     docusate sodium 100 MG capsule  Commonly known as:  COLACE  Take 1 capsule (100 mg total) by mouth 2 (two) times daily as needed for mild constipation.     enoxaparin 40 MG/0.4ML injection  Commonly known as:  LOVENOX  Inject 0.4 mLs (40 mg total) into the skin daily. For 1 month     HYDROcodone-acetaminophen 7.5-325 MG per tablet  Commonly known as:  NORCO  Take 1-2 tablets by mouth every 4 (four) hours as needed for moderate pain.     insulin glargine 100 UNIT/ML injection  Commonly known as:  LANTUS  Inject 20 Units into the skin at  bedtime.     metFORMIN 500 MG 24 hr tablet  Commonly known as:  GLUCOPHAGE-XR  Take 1 tablet by mouth 2 (two) times daily.     ondansetron 4 MG tablet  Commonly known as:  ZOFRAN  Take 1 tablet (4 mg total) by mouth every 6 (six) hours as needed for nausea.     senna 8.6 MG Tabs tablet  Commonly known as:  SENOKOT  Take 2 tablets (17.2 mg total) by mouth daily as needed for mild constipation.       No Known Allergies     Follow-up Information   Follow up with BEANE,JEFFREY C, MD In 2 weeks.   Specialty:  Orthopedic Surgery   Contact information:   [address] [place]  [address] [place] [address] [phone]     for follow up and stable removal   The results of significant diagnostics from this hospitalization (including imaging, microbiology, ancillary and laboratory) are listed below for reference.    Significant Diagnostic Studies: Dg Hip Complete Left  09/12/2013   CLINICAL DATA:  Pain post fall 3 weeks ago  EXAM: LEFT HIP - COMPLETE 2+ VIEW  COMPARISON:  None.  FINDINGS: Three views of the left hip submitted. There is minimal displaced fracture of the left femoral neck. Atherosclerotic calcifications of femoral artery. There is spurring of greater femoral trochanter. Mild spurring of superior acetabulum.  IMPRESSION: Minimal displaced fracture of the left femoral neck.   Electronically Signed   By: Lahoma Crocker M.D.   On: 09/12/2013 11:49   Dg Hip Operative Left  09/12/2013   CLINICAL DATA:  Left hip ORIF.  EXAM: OPERATIVE LEFT HIP  COMPARISON:  Radiographs 09/12/2013.  FINDINGS: Two spot fluoroscopic images demonstrate internal fixation of the left femoral neck fracture with 3 Knowles pins. The hardware appears well positioned. No complications are identified.  IMPRESSION: Left hip fracture fixation without demonstrated complication.   Electronically Signed   By: Camie Patience M.D.   On: 09/12/2013 19:45   Dg Pelvis Portable  09/12/2013   CLINICAL DATA:  Status post left hip fracture fixation.  EXAM: PORTABLE PELVIS 1-2 VIEWS  COMPARISON:  Plain films left hip 09/12/2013 at 11:30 a.m.  FINDINGS: Three screws of in place for fixation of a basicervical left femoral neck fracture. Hardware is intact. Position and alignment are near anatomic. There is some gas in the soft tissues. Surgical staples are noted. Atherosclerosis is identified. 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  IMPRESSION: ORIF left hip fracture without complicating feature.   Electronically Signed   By: Inge Rise M.D.   On: 09/12/2013 21:23   Dg Knee Complete 4 Views Left  09/12/2013    CLINICAL DATA:  Left knee pain after fall.  EXAM: LEFT KNEE - COMPLETE 4+ VIEW  COMPARISON:  None.  FINDINGS: Status post left below-knee amputation. No acute fracture or dislocation is noted. No significant joint space narrowing is noted. No significant joint effusion is noted.  IMPRESSION: Status post below-knee amputation. No acute abnormality seen in the left knee.   Electronically Signed   By: Sabino Dick M.D.   On: 09/12/2013 12:16    Microbiology: No results found for this or any previous visit (from the past 240 hour(s)).   Labs: Basic Metabolic Panel:  Recent Labs Lab 09/12/13 1401 09/12/13 1553 09/13/13 0532 09/14/13 0350  NA 139  --  138 140  K 5.6* 4.7 5.3 4.1  CL 100  --  99 102  CO2 24  --  24 25  GLUCOSE 129*  --  125* 116*  BUN 27*  --  20 19  CREATININE 1.46*  --  1.19 1.38*  CALCIUM 9.4  --  8.8 8.9   Liver Function Tests:  Recent Labs Lab 09/12/13 1401  AST 20  ALT 14  ALKPHOS 140*  BILITOT 0.3  PROT 7.9  ALBUMIN 4.1   No results found for this basename: LIPASE, AMYLASE,  in the last 168 hours No results found for this basename: AMMONIA,  in the last 168 hours CBC:  Recent Labs Lab 09/12/13 1401 09/13/13 0532 09/14/13 0350  WBC 8.6 9.7 8.0  NEUTROABS 5.7  --   --   HGB 14.9 14.0 12.0*  HCT 43.0 40.4 35.2*  MCV 85.8 85.1 85.9  PLT PLATELET CLUMPS NOTED ON SMEAR, COUNT APPEARS ADEQUATE 172 155   Cardiac Enzymes: No results found for this basename: CKTOTAL, CKMB, CKMBINDEX, TROPONINI,  in the last 168 hours BNP: BNP (last 3 results) No results found for this basename: PROBNP,  in the last 8760 hours CBG:  Recent Labs Lab 09/13/13 1159 09/13/13 1650 09/13/13 2236 09/14/13 0655 09/14/13 1131  GLUCAP 163* 165* 186* 118* 154*   Signed:  Zale Marcotte L  [facility] 09/14/2013, 12:33 PM

## 2013-09-14 NOTE — Progress Notes (Signed)
Physical Therapy Treatment Patient Details Name: Tyler Erickson MRN: [id number] DOB: 11/04/1950 Today's Date: 09/14/2013 Time: 2440-1027 PT Time Calculation (min): 24 min  PT Assessment / Plan / Recommendation  History of Present Illness Pt is a 63 y/o male with a PMH of CVA, and L BKA with prosthesis. Pt fell on ice ~3 weeks ago sustaining a L femur fracture. Pt is now s/p ORIF on the L.    PT Comments   Pt progressing towards physical therapy goals. Pt able to ambulate a fair distance today with a great rehab effort. Clarification not yet received about status of weightbearing or use of prosthesis. Will continue to follow.   Follow Up Recommendations  SNF;Supervision for mobility/OOB     Does the patient have the potential to tolerate intense rehabilitation     Barriers to Discharge        Equipment Recommendations  None recommended by PT    Recommendations for Other Services    Frequency Min 3X/week   Progress towards PT Goals Progress towards PT goals: Progressing toward goals  Plan Current plan remains appropriate    Precautions / Restrictions Precautions Precautions: Fall Restrictions Weight Bearing Restrictions: Yes Other Position/Activity Restrictions: Awaiting clarification of TDWB vs. PWB status. Pt kept NWB.   Pertinent Vitals/Pain Pt reports moderate pain throughout session.     Mobility  Bed Mobility General bed mobility comments: Pt sitting in recliner upon PT arrival.  Transfers Overall transfer level: Needs assistance Equipment used: Right platform walker Transfers: Sit to/from Stand Sit to Stand: Min assist General transfer comment: VC's for hand placement on seated surface for safety. Assist to power up to full standing.  Ambulation/Gait Ambulation/Gait assistance: Min guard Ambulation Distance (Feet): 40 Feet Assistive device: Right platform walker Gait Pattern/deviations:  (Hop-to pattern - no prosthesis) Gait velocity: Decreased Gait velocity  interpretation: Below normal speed for age/gender General Gait Details: VC's for sequencing and safety awareness. 2 standing rest breaks required.     Exercises General Exercises - Lower Extremity Quad Sets:  (x12) Long Arc Quad:  (x12) Hip ABduction/ADduction:  (x12)   PT Diagnosis:    PT Problem List:   PT Treatment Interventions:     PT Goals (current goals can now be found in the care plan section) Acute Rehab PT Goals Patient Stated Goal: To walk with prosthesis again PT Goal Formulation: With patient Time For Goal Achievement: 09/27/13 Potential to Achieve Goals: Good  Visit Information  Last PT Received On: 09/14/13 Assistance Needed: +2 (chair follow) History of Present Illness: Pt is a 63 y/o male with a PMH of CVA, and L BKA with prosthesis. Pt fell on ice ~3 weeks ago sustaining a L femur fracture. Pt is now s/p ORIF on the L.     Subjective Data  Subjective: Pt agreeable to OOB. Patient Stated Goal: To walk with prosthesis again   Cognition  Cognition Arousal/Alertness: Awake/alert Behavior During Therapy: WFL for tasks assessed/performed Overall Cognitive Status: Within Functional Limits for tasks assessed    Balance  Balance Overall balance assessment: Needs assistance Sitting-balance support: Feet supported Sitting balance-Leahy Scale: Fair Standing balance support: Bilateral upper extremity supported Standing balance-Leahy Scale: Poor  End of Session PT - End of Session Equipment Utilized During Treatment: Gait belt Activity Tolerance: Patient tolerated treatment well Patient left: in chair;with call bell/phone within reach Nurse Communication: Mobility status   GP     Jolyn Lent 09/14/2013, 4:02 PM  Jolyn Lent, PT, DPT Acute Rehabilitation Services Pager: [phone]

## 2014-02-05 ENCOUNTER — Encounter: Payer: Self-pay | Admitting: Gastroenterology

## 2014-03-28 ENCOUNTER — Other Ambulatory Visit (HOSPITAL_COMMUNITY): Payer: Medicare HMO

## 2014-03-28 ENCOUNTER — Encounter (HOSPITAL_COMMUNITY): Payer: Medicare HMO

## 2014-03-28 ENCOUNTER — Ambulatory Visit: Payer: Medicare HMO | Admitting: Family

## 2014-06-26 ENCOUNTER — Ambulatory Visit
Admission: RE | Admit: 2014-06-26 | Discharge: 2014-06-26 | Disposition: A | Discharge status: Home / Self Care | Payer: Commercial Managed Care - HMO | Source: Ambulatory Visit | Attending: Family Medicine | Admitting: Family Medicine

## 2014-06-26 ENCOUNTER — Other Ambulatory Visit: Payer: Self-pay | Admitting: Family Medicine

## 2014-06-26 DIAGNOSIS — R05 Cough: Secondary | ICD-10-CM

## 2014-06-26 DIAGNOSIS — R059 Cough, unspecified: Secondary | ICD-10-CM

## 2014-07-04 ENCOUNTER — Other Ambulatory Visit (HOSPITAL_COMMUNITY): Payer: Self-pay | Admitting: Cardiology

## 2014-07-04 ENCOUNTER — Ambulatory Visit (HOSPITAL_COMMUNITY)
Admission: RE | Admit: 2014-07-04 | Discharge: 2014-07-04 | Disposition: A | Discharge status: Home / Self Care | Payer: Commercial Managed Care - HMO | Source: Ambulatory Visit | Attending: Vascular Surgery | Admitting: Vascular Surgery

## 2014-07-04 DIAGNOSIS — I739 Peripheral vascular disease, unspecified: Secondary | ICD-10-CM

## 2014-07-08 ENCOUNTER — Encounter (HOSPITAL_COMMUNITY): Payer: Self-pay | Admitting: Cardiology

## 2014-07-08 ENCOUNTER — Encounter: Payer: Self-pay | Admitting: Cardiology

## 2014-07-08 DIAGNOSIS — I70209 Unspecified atherosclerosis of native arteries of extremities, unspecified extremity: Secondary | ICD-10-CM

## 2014-07-08 DIAGNOSIS — N183 Chronic kidney disease, stage 3 unspecified: Secondary | ICD-10-CM

## 2014-07-08 DIAGNOSIS — I255 Ischemic cardiomyopathy: Secondary | ICD-10-CM | POA: Diagnosis present

## 2014-07-08 DIAGNOSIS — E1122 Type 2 diabetes mellitus with diabetic chronic kidney disease: Secondary | ICD-10-CM

## 2014-07-08 DIAGNOSIS — I5022 Chronic systolic (congestive) heart failure: Secondary | ICD-10-CM

## 2014-07-08 HISTORY — DX: Type 2 diabetes mellitus with diabetic chronic kidney disease: E11.22

## 2014-07-08 HISTORY — DX: Chronic kidney disease, stage 3 unspecified: N18.30

## 2014-07-08 HISTORY — DX: Unspecified atherosclerosis of native arteries of extremities, unspecified extremity: I70.209

## 2014-07-08 MED ORDER — SODIUM CHLORIDE 0.9 % IJ SOLN: 3.0000 mL | Freq: Two times a day (BID) | INTRAMUSCULAR | Status: DC

## 2014-07-08 MED ORDER — SODIUM CHLORIDE 0.9 % IV SOLN: 250.0000 mL | INTRAVENOUS | Status: DC | PRN

## 2014-07-08 MED ORDER — SODIUM CHLORIDE 0.9 % IV SOLN
INTRAVENOUS | Status: DC
  Administered 2014-07-10: 13:00:00 via INTRAVENOUS

## 2014-07-08 MED ORDER — SODIUM CHLORIDE 0.9 % IJ SOLN: 3.0000 mL | INTRAMUSCULAR | Status: DC | PRN

## 2014-07-08 MED ORDER — ASPIRIN 81 MG PO CHEW: 81.0000 mg | CHEWABLE_TABLET | ORAL | Status: AC

## 2014-07-08 NOTE — H&P (Signed)
History and Physical:   Tyler Erickson, Geisel  Date of visit:  07/08/2014 DOB:  September 02, 1950    Age:  64 yrs. Medical record number:  [id number]     Account number:  [id number] Primary Care Provider: Donnie Coffin ____________________________ CURRENT DIAGNOSES  1. Dyspnea  2. Chest pain  3. Abnormal electrocardiogram [ECG]  4. Encounter for preprocedural cardiovascular examination  5. Type 2 diabetes mellitus with other circulatory complications  6. Hyperlipidemia  7. Essential hypertension  8. Peripheral vascular disease, unspecified  9. Chronic kidney disease, stage 3 (moderate)  10. 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 Personal History Of Malignant Neoplasm Of Large Intestine  12. Acquired Absence Of Leg Below Knee  13. Dyspnea, unspecified ____________________________ ALLERGIES  No Known Allergies ____________________________ MEDICATIONS  1. Lantus 100 unit/mL subcutaneous solution, Take as directed  2. isosorbide mononitrate ER 30 mg tablet,extended release 24 hr, 1 p.o. daily  3. furosemide 40 mg tablet, BID  4. aspirin 81 mg chewable tablet, 1 p.o. daily ____________________________ HISTORY OF PRESENT ILLNESS Patient returns for cardiac followup. He was seen last week for evaluation of dyspnea, chest pain and an abnormal EKG. He has a long-standing history of diabetes mellitus with peripheral neuropathy. He developed a ischemic foot and had a left below-knee amputation done in January 2014 for wet gangrene. He had a complicated hospitalization with positive blood cultures for Streptococcus and then developed some necrosis of his skin involving his right arm. It is difficult to tell exactly what happened from reviewing the charts but evidently was thought to have some strokes that occurred during that time that left him with inability to use his right arm and he also had mention of possible cervical stenosis. He evidently spent  time in rehabilitation following that and has been able to walk with a prosthesis since then. He had a normal echocardiogram at that time and a TEE was unremarkable at that time (EF 55%). He evidently required thoracentesis for some pleural effusions at that time and later was readmitted for anemia. He evidently now lives with a brother and a sister comes along. He has not taken his insulin in some time. He presented to his primary physician's office with a one month history of worsening dyspnea on exertion as well as some PND. He also had some mild chest discomfort exertion and breathing cold air in. The BNP was elevated at 1000. He was started on furosemide last week and was placed on isosorbide as well as aspirin by me. He returned today for an echocardiogram that showed severe LV dysfunction with an EF of 25-30% and moderate mitral regurgitation. He also had Doppler studies of his upper arm since showing essentially very little flow going to his arms which made it difficult for [place] to get blood pressures in his arms. He is feeling weak and has lost 10 pounds since being started on the furosemide. ____________________________ PAST HISTORY  Past Medical Illnesses:  hypertension, DM-insulin dependent, hyperlipidemia, peripheral vascular disease, renal insufficiency, colon cancer, peripheral neuropathy, history of CVA with deficits, cervical disc disease;  Cardiovascular Illnesses:  no previous history of cardiac disease.;  Surgical Procedures:  colectomy-partial, BKA-left, takedown of ileostomy, ORIF of femur fracture;  NYHA Classification:  II;  Canadian Angina Classification:  Class 3: Angina with mild exertion;  Cardiology Procedures-Invasive:  no history of prior cardiac procedures;  Cardiology Procedures-Noninvasive:  TEE 2014, echocardiogram January 2016;  LVEF of 30% documented via echocardiogram on 07/08/2014,  ____________________________ CARDIO-PULMONARY TEST DATES EKG Date:  07/01/2014;   Echocardiography Date: 07/08/2014;   ____________________________ FAMILY HISTORY Brother -- Brother alive with problem, Diabetes mellitus Father -- Father dead, Heart Attack, Diabetes mellitus Mother -- Mother dead, Heart Attack, Diabetes mellitus Sister -- Sister alive with problem, Diabetes mellitus Sister -- Sister alive with problem, Diabetes mellitus ____________________________ SOCIAL HISTORY Alcohol Use:  no alcohol use;  Smoking:  never smoked;  Diet:  low fat;  Lifestyle:  single;  Exercise:  farm work;  Occupation:  disabled;  Residence:  lives with brother;   ____________________________ REVIEW OF SYSTEMS General:  denies recent weight change, fatique or change in exercise tolerance.  Integumentary:no rashes or new skin lesions. Eyes: wears eye glasses/contact lenses, diabetic retinopathy Ears, Nose, Throat, Mouth:  denies any hearing loss, epistaxis, hoarseness or difficulty speaking. Respiratory: dyspnea with exertion Cardiovascular:  please review HPI Abdominal: denies dyspepsia, GI bleeding, constipation, or diarrhea Genitourinary-Male: nocturia, hesitancy  Musculoskeletal:  denies arthritis, venous insufficiency, or muscle weakness Neurological:  denies headaches, stroke, or TIA Psychiatric:  depression  ____________________________ PHYSICAL EXAMINATION VITAL SIGNS  Blood Pressure:     Pulse:  74/min. Weight:  147.00 lbs. Height:  66"BMI: 24  Constitutional:  pleasant white male in no acute distress Skin:  warm and dry to touch, no apparent skin lesions, or masses noted. Head:  normocephalic, normal hair pattern, no masses or tenderness Eyes:  EOMS Intact, PERRLA, C and S clear, Funduscopic exam not done. ENT:  ears, nose and throat reveal no gross abnormalities.  Dentition good. Neck:  supple, without massess. No JVD, thyromegaly or carotid bruits. Carotid upstroke normal. Chest:  normal symmetry, clear to auscultation. Cardiac:  regular rhythm, normal S1 and S2, No S3 or  S4, no murmurs, gallops or rubs detected. Abdomen:  abdomen soft,non-tender, no masses, no hepatospenomegaly, or aneurysm noted Peripheral Pulses:  Femoral pulses 2+ bilaterally, pedal pulses reduced in right foot, BKA on left Extremities & Back:  left BKA, 2+ edema right leg Neurological:  paresis of right arm, unsteady gait ____________________________ MOST RECENT LIPID PANEL 06/26/14  CHOL TOTL 129 mg/dl, LDL 83 NM, HDL 29 mg/dl, TRIGLYCER 84 mg/dl,  CHOL/HDL 4.4 (Calc)  ____________________________ IMPRESSIONS/PLAN  1. Cardiomyopathy likely ischemic with evidence of a previous old anterior MI on his EKG and possible recent onset of angina 2. Insulin-dependent diabetes mellitus 3. Previous stroke 4. Severe bilateral peripheral vascular disease involving both arms 5. Chronic systolic congestive heart failure 6. Evidence of chronic kidney disease and the presence of diabetes  Recommendations:  Very complicated at this time. He does have known cardiomyopathy and have recommended that he have catheterization as I think it is highly likely he is to have significant ischemic heart disease as a cause. In addition he is going to need to have a vascular consultation about the problems with his subclavian arteries. His sister was back today and we discussed that if he does have significant atherosclerotic disease and he will likely require revascularization. His comorbidities placing an increased risk of this also. Revascularization would optimally include either bypass grafting for multivessel disease and diabetic or multiple stents and bypass grafting is felt to be too high risk.  I recommended cardiac catheterization and he is agreeable. Cardiac catheterization was discussed with the patient including risks of myocardial infarction, death, stroke, bleeding, arrhythmia, dye allergy, or renal insufficiency. He understands and is willing to proceed. We'll plan to go in the left leg since he has had  a previous amputation on  that side. We will do this on Wednesday. ____________________________ TODAYS ORDERS  1. Basic Metabolic Panel: Today  2. Draw PT/INR: Today  3. PTT: Today                       ____________________________ Cardiology Physician:  Kerry Hough MD [facility]

## 2014-07-10 ENCOUNTER — Encounter: Payer: Self-pay | Admitting: Cardiology

## 2014-07-10 ENCOUNTER — Inpatient Hospital Stay (HOSPITAL_COMMUNITY)
Admission: RE | Admit: 2014-07-10 | Discharge: 2014-07-29 | DRG: 286 | Disposition: E | Payer: Commercial Managed Care - HMO | Source: Ambulatory Visit | Attending: Cardiology | Admitting: Cardiology

## 2014-07-10 ENCOUNTER — Encounter (HOSPITAL_COMMUNITY): Admission: RE | Disposition: E | Payer: Self-pay | Source: Ambulatory Visit | Attending: Cardiology

## 2014-07-10 DIAGNOSIS — N183 Chronic kidney disease, stage 3 unspecified: Secondary | ICD-10-CM

## 2014-07-10 DIAGNOSIS — T508X5A Adverse effect of diagnostic agents, initial encounter: Secondary | ICD-10-CM | POA: Diagnosis not present

## 2014-07-10 DIAGNOSIS — E871 Hypo-osmolality and hyponatremia: Secondary | ICD-10-CM | POA: Diagnosis present

## 2014-07-10 DIAGNOSIS — E872 Acidosis: Secondary | ICD-10-CM | POA: Diagnosis not present

## 2014-07-10 DIAGNOSIS — R0602 Shortness of breath: Secondary | ICD-10-CM

## 2014-07-10 DIAGNOSIS — K567 Ileus, unspecified: Secondary | ICD-10-CM | POA: Diagnosis not present

## 2014-07-10 DIAGNOSIS — Z66 Do not resuscitate: Secondary | ICD-10-CM | POA: Diagnosis present

## 2014-07-10 DIAGNOSIS — Z85038 Personal history of other malignant neoplasm of large intestine: Secondary | ICD-10-CM

## 2014-07-10 DIAGNOSIS — Z8673 Personal history of transient ischemic attack (TIA), and cerebral infarction without residual deficits: Secondary | ICD-10-CM

## 2014-07-10 DIAGNOSIS — Z89512 Acquired absence of left leg below knee: Secondary | ICD-10-CM

## 2014-07-10 DIAGNOSIS — F17210 Nicotine dependence, cigarettes, uncomplicated: Secondary | ICD-10-CM | POA: Diagnosis present

## 2014-07-10 DIAGNOSIS — Z452 Encounter for adjustment and management of vascular access device: Secondary | ICD-10-CM

## 2014-07-10 DIAGNOSIS — I2582 Chronic total occlusion of coronary artery: Secondary | ICD-10-CM | POA: Diagnosis present

## 2014-07-10 DIAGNOSIS — I255 Ischemic cardiomyopathy: Secondary | ICD-10-CM

## 2014-07-10 DIAGNOSIS — E43 Unspecified severe protein-calorie malnutrition: Secondary | ICD-10-CM | POA: Diagnosis present

## 2014-07-10 DIAGNOSIS — N141 Nephropathy induced by other drugs, medicaments and biological substances: Secondary | ICD-10-CM | POA: Diagnosis not present

## 2014-07-10 DIAGNOSIS — Z7401 Bed confinement status: Secondary | ICD-10-CM

## 2014-07-10 DIAGNOSIS — I5022 Chronic systolic (congestive) heart failure: Secondary | ICD-10-CM

## 2014-07-10 DIAGNOSIS — I252 Old myocardial infarction: Secondary | ICD-10-CM

## 2014-07-10 DIAGNOSIS — I70209 Unspecified atherosclerosis of native arteries of extremities, unspecified extremity: Secondary | ICD-10-CM | POA: Diagnosis present

## 2014-07-10 DIAGNOSIS — I469 Cardiac arrest, cause unspecified: Secondary | ICD-10-CM | POA: Diagnosis not present

## 2014-07-10 DIAGNOSIS — R0600 Dyspnea, unspecified: Secondary | ICD-10-CM

## 2014-07-10 DIAGNOSIS — I493 Ventricular premature depolarization: Secondary | ICD-10-CM | POA: Diagnosis not present

## 2014-07-10 DIAGNOSIS — I5021 Acute systolic (congestive) heart failure: Secondary | ICD-10-CM

## 2014-07-10 DIAGNOSIS — I509 Heart failure, unspecified: Secondary | ICD-10-CM

## 2014-07-10 DIAGNOSIS — R9431 Abnormal electrocardiogram [ECG] [EKG]: Secondary | ICD-10-CM

## 2014-07-10 DIAGNOSIS — Z515 Encounter for palliative care: Secondary | ICD-10-CM | POA: Insufficient documentation

## 2014-07-10 DIAGNOSIS — I748 Embolism and thrombosis of other arteries: Secondary | ICD-10-CM | POA: Diagnosis present

## 2014-07-10 DIAGNOSIS — I5023 Acute on chronic systolic (congestive) heart failure: Principal | ICD-10-CM | POA: Diagnosis present

## 2014-07-10 DIAGNOSIS — I2510 Atherosclerotic heart disease of native coronary artery without angina pectoris: Secondary | ICD-10-CM

## 2014-07-10 DIAGNOSIS — I739 Peripheral vascular disease, unspecified: Secondary | ICD-10-CM | POA: Diagnosis present

## 2014-07-10 DIAGNOSIS — E1142 Type 2 diabetes mellitus with diabetic polyneuropathy: Secondary | ICD-10-CM | POA: Diagnosis present

## 2014-07-10 DIAGNOSIS — N19 Unspecified kidney failure: Secondary | ICD-10-CM

## 2014-07-10 DIAGNOSIS — R570 Cardiogenic shock: Secondary | ICD-10-CM

## 2014-07-10 DIAGNOSIS — Z833 Family history of diabetes mellitus: Secondary | ICD-10-CM

## 2014-07-10 DIAGNOSIS — I471 Supraventricular tachycardia: Secondary | ICD-10-CM | POA: Diagnosis not present

## 2014-07-10 DIAGNOSIS — R112 Nausea with vomiting, unspecified: Secondary | ICD-10-CM

## 2014-07-10 DIAGNOSIS — R34 Anuria and oliguria: Secondary | ICD-10-CM | POA: Diagnosis not present

## 2014-07-10 DIAGNOSIS — N184 Chronic kidney disease, stage 4 (severe): Secondary | ICD-10-CM | POA: Diagnosis present

## 2014-07-10 DIAGNOSIS — E1151 Type 2 diabetes mellitus with diabetic peripheral angiopathy without gangrene: Secondary | ICD-10-CM | POA: Diagnosis present

## 2014-07-10 DIAGNOSIS — M4802 Spinal stenosis, cervical region: Secondary | ICD-10-CM | POA: Insufficient documentation

## 2014-07-10 DIAGNOSIS — N179 Acute kidney failure, unspecified: Secondary | ICD-10-CM

## 2014-07-10 DIAGNOSIS — Z794 Long term (current) use of insulin: Secondary | ICD-10-CM

## 2014-07-10 DIAGNOSIS — I4892 Unspecified atrial flutter: Secondary | ICD-10-CM | POA: Diagnosis not present

## 2014-07-10 DIAGNOSIS — E1122 Type 2 diabetes mellitus with diabetic chronic kidney disease: Secondary | ICD-10-CM | POA: Diagnosis present

## 2014-07-10 DIAGNOSIS — E875 Hyperkalemia: Secondary | ICD-10-CM | POA: Diagnosis present

## 2014-07-10 DIAGNOSIS — I472 Ventricular tachycardia: Secondary | ICD-10-CM | POA: Diagnosis not present

## 2014-07-10 HISTORY — DX: Acute myocardial infarction, unspecified: I21.9

## 2014-07-10 HISTORY — PX: LEFT HEART CATHETERIZATION WITH CORONARY ANGIOGRAM: SHX5451

## 2014-07-10 HISTORY — DX: Ischemic cardiomyopathy: I25.5

## 2014-07-10 HISTORY — DX: Malignant neoplasm of colon, unspecified: C18.9

## 2014-07-10 HISTORY — DX: Chronic kidney disease, stage 3 (moderate): N18.3

## 2014-07-10 HISTORY — DX: Type 2 diabetes mellitus with diabetic polyneuropathy: E11.42

## 2014-07-10 HISTORY — DX: Type 2 diabetes mellitus with diabetic chronic kidney disease: E11.22

## 2014-07-10 HISTORY — PX: CARDIAC CATHETERIZATION: SHX172

## 2014-07-10 HISTORY — DX: Unspecified atherosclerosis of native arteries of extremities, unspecified extremity: I70.209

## 2014-07-10 HISTORY — DX: Acquired absence of unspecified leg below knee: Z89.519

## 2014-07-10 HISTORY — DX: Spinal stenosis, cervical region: M48.02

## 2014-07-10 HISTORY — DX: Personal history of other malignant neoplasm of large intestine: Z85.038

## 2014-07-10 HISTORY — DX: Heart failure, unspecified: I50.9

## 2014-07-10 HISTORY — DX: Atherosclerotic heart disease of native coronary artery without angina pectoris: I25.10

## 2014-07-10 HISTORY — DX: Pleural effusion, not elsewhere classified: J90

## 2014-07-10 HISTORY — DX: Cardiac murmur, unspecified: R01.1

## 2014-07-10 HISTORY — DX: Type 2 diabetes mellitus with other circulatory complications: E11.59

## 2014-07-10 LAB — CBC
HCT: 47 % (ref 39.0–52.0)
HCT: 46.1 % (ref 39.0–52.0)
HEMOGLOBIN: 15.5 g/dL (ref 13.0–17.0)
Hemoglobin: 15 g/dL (ref 13.0–17.0)
MCH: 28.1 pg (ref 26.0–34.0)
MCH: 28.4 pg (ref 26.0–34.0)
MCHC: 33 g/dL (ref 30.0–36.0)
MCHC: 32.5 g/dL (ref 30.0–36.0)
MCV: 85.1 fL (ref 78.0–100.0)
MCV: 87.1 fL (ref 78.0–100.0)
PLATELETS: 172 10*3/uL (ref 150–400)
Platelets: 168 10*3/uL (ref 150–400)
RBC: 5.52 MIL/uL (ref 4.22–5.81)
RBC: 5.29 MIL/uL (ref 4.22–5.81)
RDW: 14.1 % (ref 11.5–15.5)
RDW: 14.3 % (ref 11.5–15.5)
WBC: 10.8 10*3/uL — ABNORMAL HIGH (ref 4.0–10.5)
WBC: 9.7 10*3/uL (ref 4.0–10.5)

## 2014-07-10 LAB — BASIC METABOLIC PANEL
Anion gap: 9 (ref 5–15)
BUN: 32 mg/dL — ABNORMAL HIGH (ref 6–23)
CALCIUM: 8.7 mg/dL (ref 8.4–10.5)
CHLORIDE: 102 meq/L (ref 96–112)
CO2: 27 mmol/L (ref 19–32)
Creatinine, Ser: 1.77 mg/dL — ABNORMAL HIGH (ref 0.50–1.35)
GFR calc Af Amer: 46 mL/min — ABNORMAL LOW (ref >90)
GFR calc non Af Amer: 39 mL/min — ABNORMAL LOW (ref >90)
Glucose, Bld: 222 mg/dL — ABNORMAL HIGH (ref 70–99)
Potassium: 3.9 mmol/L (ref 3.5–5.1)
SODIUM: 138 mmol/L (ref 135–145)

## 2014-07-10 LAB — URINALYSIS, ROUTINE W REFLEX MICROSCOPIC
Glucose, UA: NEGATIVE mg/dL
Hgb urine dipstick: NEGATIVE
Ketones, ur: 15 mg/dL — AB
Leukocytes, UA: NEGATIVE
Nitrite: NEGATIVE
Protein, ur: 100 mg/dL — AB
Specific Gravity, Urine: 1.029 (ref 1.005–1.030)
Urobilinogen, UA: 1 mg/dL (ref 0.0–1.0)
pH: 6.5 (ref 5.0–8.0)

## 2014-07-10 LAB — BRAIN NATRIURETIC PEPTIDE: B Natriuretic Peptide: 1312.4 pg/mL — ABNORMAL HIGH (ref 0.0–100.0)

## 2014-07-10 LAB — SURGICAL PCR SCREEN
MRSA, PCR: NEGATIVE
Staphylococcus aureus: NEGATIVE

## 2014-07-10 LAB — GLUCOSE, CAPILLARY
GLUCOSE-CAPILLARY: 200 mg/dL — AB (ref 70–99)
Glucose-Capillary: 149 mg/dL — ABNORMAL HIGH (ref 70–99)
Glucose-Capillary: 199 mg/dL — ABNORMAL HIGH (ref 70–99)

## 2014-07-10 LAB — CREATININE, SERUM
Creatinine, Ser: 1.87 mg/dL — ABNORMAL HIGH (ref 0.50–1.35)
GFR calc Af Amer: 43 mL/min — ABNORMAL LOW (ref >90)
GFR, EST NON AFRICAN AMERICAN: 37 mL/min — AB (ref >90)

## 2014-07-10 LAB — URINE MICROSCOPIC-ADD ON

## 2014-07-10 LAB — MAGNESIUM: Magnesium: 2.1 mg/dL (ref 1.5–2.5)

## 2014-07-10 SURGERY — LEFT HEART CATHETERIZATION WITH CORONARY ANGIOGRAM
Anesthesia: LOCAL

## 2014-07-10 MED ORDER — ENOXAPARIN SODIUM 30 MG/0.3ML ~~LOC~~ SOLN
30.0000 mg | SUBCUTANEOUS | Status: DC
  Administered 2014-07-11 – 2014-07-12 (×2): 30 mg via SUBCUTANEOUS
  Filled 2014-07-10 (×3): qty 0.3

## 2014-07-10 MED ORDER — SODIUM CHLORIDE 0.9 % IV SOLN: 1.0000 mL/kg/h | INTRAVENOUS | Status: DC

## 2014-07-10 MED ORDER — ASPIRIN EC 81 MG PO TBEC
81.0000 mg | DELAYED_RELEASE_TABLET | Freq: Every day | ORAL | Status: DC
  Administered 2014-07-11 – 2014-07-21 (×11): 81 mg via ORAL
  Filled 2014-07-10 (×12): qty 1

## 2014-07-10 MED ORDER — HYDROCOD POLST-CHLORPHEN POLST 10-8 MG/5ML PO LQCR
5.0000 mL | Freq: Once | ORAL | Status: DC
  Filled 2014-07-10: qty 5

## 2014-07-10 MED ORDER — ONDANSETRON HCL 4 MG/2ML IJ SOLN
4.0000 mg | Freq: Four times a day (QID) | INTRAMUSCULAR | Status: DC | PRN
  Administered 2014-07-13 – 2014-07-20 (×6): 4 mg via INTRAVENOUS
  Filled 2014-07-10 (×6): qty 2

## 2014-07-10 MED ORDER — ASPIRIN 81 MG PO TABS
81.0000 mg | ORAL_TABLET | Freq: Every day | ORAL | Status: DC
Start: 2014-07-10 — End: 2014-07-10

## 2014-07-10 MED ORDER — INSULIN ASPART 100 UNIT/ML ~~LOC~~ SOLN
0.0000 [IU] | Freq: Three times a day (TID) | SUBCUTANEOUS | Status: DC
  Administered 2014-07-10 – 2014-07-11 (×2): 2 [IU] via SUBCUTANEOUS
  Administered 2014-07-11: 13:00:00 1 [IU] via SUBCUTANEOUS
  Administered 2014-07-12: 2 [IU] via SUBCUTANEOUS
  Administered 2014-07-12: 1 [IU] via SUBCUTANEOUS
  Administered 2014-07-13 (×3): 3 [IU] via SUBCUTANEOUS
  Administered 2014-07-14: 2 [IU] via SUBCUTANEOUS
  Administered 2014-07-15: 1 [IU] via SUBCUTANEOUS
  Administered 2014-07-15: 2 [IU] via SUBCUTANEOUS
  Administered 2014-07-16: 1 [IU] via SUBCUTANEOUS
  Administered 2014-07-16: 100 [IU] via SUBCUTANEOUS
  Administered 2014-07-16 – 2014-07-17 (×3): 2 [IU] via SUBCUTANEOUS
  Administered 2014-07-17: 1 [IU] via SUBCUTANEOUS
  Administered 2014-07-18: 2 [IU] via SUBCUTANEOUS
  Administered 2014-07-18: 1 [IU] via SUBCUTANEOUS
  Administered 2014-07-18: 2 [IU] via SUBCUTANEOUS
  Administered 2014-07-19 (×3): 1 [IU] via SUBCUTANEOUS
  Administered 2014-07-20 (×2): 2 [IU] via SUBCUTANEOUS

## 2014-07-10 MED ORDER — ISOSORBIDE MONONITRATE ER 30 MG PO TB24
30.0000 mg | ORAL_TABLET | Freq: Every day | ORAL | Status: DC
  Administered 2014-07-10 – 2014-07-21 (×12): 30 mg via ORAL
  Filled 2014-07-10 (×13): qty 1

## 2014-07-10 MED ORDER — ACETAMINOPHEN 325 MG PO TABS: 650.0000 mg | ORAL_TABLET | ORAL | Status: DC | PRN

## 2014-07-10 MED ORDER — ASPIRIN 81 MG PO CHEW
81.0000 mg | CHEWABLE_TABLET | ORAL | Status: AC
  Administered 2014-07-10: 81 mg via ORAL

## 2014-07-10 MED ORDER — NITROGLYCERIN 1 MG/10 ML FOR IR/CATH LAB
INTRA_ARTERIAL | Status: AC
  Filled 2014-07-10: qty 10

## 2014-07-10 MED ORDER — HEPARIN (PORCINE) IN NACL 2-0.9 UNIT/ML-% IJ SOLN
INTRAMUSCULAR | Status: AC
  Filled 2014-07-10: qty 1500

## 2014-07-10 MED ORDER — LIDOCAINE HCL (PF) 1 % IJ SOLN
INTRAMUSCULAR | Status: AC
  Filled 2014-07-10: qty 30

## 2014-07-10 MED ORDER — ASPIRIN 81 MG PO CHEW
CHEWABLE_TABLET | ORAL | Status: AC
  Administered 2014-07-10: 81 mg via ORAL
  Filled 2014-07-10: qty 1

## 2014-07-10 NOTE — Progress Notes (Signed)
Patient seen post catheterization.  The case was discussed with Dr. Nils Pyle.  He is currently not having chest pain.  Coughing some.  Has significant LV dysfunction by echocardiogram on Monday with EF 20%.  Severe left main and three-vessel disease noted at catheterization today.  Multiple other issues exist including occluded axillary veins and previous amputation on the left.  Viability may be an issue , but renal function limits use of other agents.  Clinically, the patient was not having a lot of symptoms until a month ago.  Renal function may complicate viability testing.    I'm asked advanced heart failure to see the patient in the morning and begin low-dose beta blockers this evening.  Await cardiac surgical opinion.  Will likely need extensive evaluation for conduit.  Discussed with patient and family that a surgical option does carry increased risk and will await final surgical opinion.  The left groin cath site is clean and dry without ecchymosis.  Kerry Hough MD [facility]

## 2014-07-10 NOTE — Progress Notes (Signed)
Dr Wynonia Lawman paged, pt has cough x 1 month and is worsening when he lays down flat, orders followed.

## 2014-07-10 NOTE — Progress Notes (Signed)
Manual pressure held for 20 minutes on the left groin post 52F sheath pull. Pt tolerated well wit no complications noted and the patient states no pain. Report given to Nari, RN on 6500 and the pt wil be transported momentarily.

## 2014-07-10 NOTE — Consult Note (Signed)
[address]       [place] [address]             [phone]        Tyler Erickson [place] Medical Record #[id number] Date of Birth: 30-Apr-1951  Referring: No ref. provider found Primary Care: Donnie Coffin, MD  Patient examined, coronary angiogram and 2-D echo cardio gram reviewed  Chief Complaint:   Shortness of breath, weakness  History of Present Illness:     64 year old diabetic was admitted to the hospital today after coronary angiogram to evaluate ischemic cardiomyopathy and class IV heart failure. He is a nonsmoker but has severe peripheral vascular disease. His echocardiogram shows severe LV dysfunction with LV dilatation, EF 2 20-25 percent, moderate ischemic MR  The patient had a severe prolonged critical illness in 2014 when he developed wet gangrene of his left foot with sepsis requiring left BKA. He had multisystem failure as well as soft tissue necrosis of each upper extremity requiring plastic surgery. He survived and was able to ambulate with his prosthesis however he fell this past summer and fractured his left hip requiring a hip pinning. Recently he has had very poor exercise tolerance with shortness of breath, edema, and he has been found to have advanced heart failure with low ejection fraction. LVEDP was not measured today.  The patient has severe peripheral arterial vascular disease and has no palpable pulses in either upper extremity. He does have palpable femoral pulses. He is felt to have occlusion of his right axillary artery and left axillary artery by vascular ultrasound studies  The patient has been stage III chronic kidney disease. The patient has been losing weight with episodes of nausea and vomiting over the past 2 weeks. 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 treated with right colectomy and postop chemotherapy in 2009   Zubrod Score: At the time of surgery this patient's most appropriate activity status/level should be described as: []     0    Normal activity, no symptoms []     1    Restricted in physical strenuous activity but ambulatory, able to do out light work []     2    Ambulatory and capable of self care, unable to do work activities, up and about                 more than 50%  Of the time                            []     3    Only limited self care, in bed greater than 50% of waking hours [x]     4    Completely disabled, no self care, confined to bed or chair []     5    Moribund  Past Medical History  Diagnosis Date  . History of colon cancer     S/P surgery/chemotherapy  . Peripheral sensory neuropathy due to type 2 diabetes mellitus   . CVA (cerebral infarction) 07/29/2012    06/29/12  . Peripheral vascular disease   . Kidney stones   . Atherosclerosis of arteries of extremities 07/08/2014  . S/P unilateral BKA (below knee amputation) left 07/28/2012  . Type  2 diabetes mellitus with vascular disease 03/14/2009  . Stage 3 chronic kidney disease due to type 2 diabetes mellitus 07/08/2014  . Ischemic cardiomyopathy   . Cervical spinal stenosis     Past Surgical History  Procedure Laterality Date  . 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 Ileostomy closure  10/31/2009  . Portacath placement  08/08/2008  . Amputation  06/30/2012    Procedure: AMPUTATION BELOW KNEE;  Surgeon: Elam Dutch, MD;  Location: [facility];  Service: Vascular;  Laterality: Left;  . Intraoperative arteriogram  06/30/2012    Procedure: INTRA OPERATIVE ARTERIOGRAM;  Surgeon: Elam Dutch, MD;  Location: [facility];  Service: Vascular;  Laterality: Right;  . Aortogram  06/30/2012    Procedure: AORTOGRAM;  Surgeon: Elam Dutch, MD;  Location: [facility] OR;  Service: Vascular;  Laterality: Right;   Right upper extremity arch   . Tee without cardioversion  07/05/2012    Procedure: TRANSESOPHAGEAL ECHOCARDIOGRAM (TEE);  Surgeon: Thayer Headings, MD;  Location: [facility];  Service: Cardiovascular;  Laterality: N/A;  . Port-a-cath removal    . Incision and drainage of wound N/A 10/11/2012    Procedure: IRRIGATION AND DEBRIDEMENT OF RIGHT FOREARM AND LEFT ELBOW WITH POSSIBLE SURGICAL PREP WITH PLACEMENT OF ACELL AND [place];  Surgeon: Theodoro Kos, DO;  Location: [facility];  Service: Plastics;  Laterality: N/A;  IRRIGATION AND DEBRIDEMENT OF RIGHT FOREARM AND LEFT ELBOW WITH PLACEMENT OF ACELL AND VAC  . Hip pinning Left 09/12/2013  . Hip pinning,cannulated Left 09/12/2013    Procedure: CANNULATED HIP PINNING;  Surgeon: Johnn Hai, MD;  Location: [facility];  Service: Orthopedics;  Laterality: Left;    History  Smoking status  . Current Some Day Smoker -- 20 years  . Types: Cigars  . Start date: 06/29/2012  Smokeless tobacco  . Never Used    Comment: 09/12/2013 "last cigar ~ 2 wk ago"    History  Alcohol Use No    History   Social History  . Marital Status: Single    Spouse Name: N/A    Number of Children: 0  . Years of Education: 12   Occupational History  . Disabled since diagnosed with colon cancer   . Glass blower/designer prior to disability    Social History Main Topics  . Smoking status: Current Some Day Smoker -- 20 years    Types: Cigars    Start date: 06/29/2012  . Smokeless tobacco: Never Used     Comment: 09/12/2013 "last cigar ~ 2 wk ago"  . Alcohol Use: No  . Drug Use: No  . Sexual Activity: Not Currently   Other Topics Concern  . Not on file   Social History Narrative   Single.  Lives with his brother.  No children.      No Known Allergies  Current Facility-Administered Medications  Medication Dose Route Frequency Provider Last Rate Last Dose  . 0.9 %  sodium chloride infusion  1 mL/kg/hr Intravenous Continuous Jacolyn Reedy, MD 68 mL/hr at 07/02/2014 1531 1  mL/kg/hr at 07/04/2014 1531  . acetaminophen (TYLENOL) tablet 650 mg  650 mg Oral Q4H PRN Jacolyn Reedy, MD      . Derrill Memo ON 07/11/2014] aspirin EC tablet 81 mg  81 mg Oral Daily Jacolyn Reedy, MD      . chlorpheniramine-HYDROcodone (TUSSIONEX) 10-8 MG/5ML suspension 5 mL  5 mL Oral Once Jacolyn Reedy, MD   5 mL at 07/16/2014 1600  . [  START ON 07/11/2014] enoxaparin (LOVENOX) injection 30 mg  30 mg Subcutaneous Q24H Jacolyn Reedy, MD      . insulin aspart (novoLOG) injection 0-9 Units  0-9 Units Subcutaneous TID WC Jacolyn Reedy, MD   2 Units at 06/30/2014 1834  . isosorbide mononitrate (IMDUR) 24 hr tablet 30 mg  30 mg Oral Daily Jacolyn Reedy, MD   30 mg at 07/26/2014 1835  . ondansetron (ZOFRAN) injection 4 mg  4 mg Intravenous Q6H PRN Jacolyn Reedy, MD        Prescriptions prior to admission  Medication Sig Dispense Refill Last Dose  . aspirin 81 MG tablet Take 81 mg by mouth daily.   07/09/2014 at Unknown time  . furosemide (LASIX) 40 MG tablet Take 1 tablet by mouth 2 (two) times daily.   07/09/2014 at Unknown time  . isosorbide mononitrate (IMDUR) 30 MG 24 hr tablet Take 1 tablet by mouth daily.   07/09/2014 at Unknown time  . acetaminophen (TYLENOL) 325 MG tablet Take 2 tablets (650 mg total) by mouth every 6 (six) hours as needed for mild pain (or Fever >/= 101). (Patient not taking: Reported on 06/29/2014)     . bisacodyl (DULCOLAX) 10 MG suppository Place 1 suppository (10 mg total) rectally as needed for moderate constipation. (Patient not taking: Reported on 07/20/2014) 12 suppository 0   . docusate sodium (COLACE) 100 MG capsule Take 1 capsule (100 mg total) by mouth 2 (two) times daily as needed for mild constipation. (Patient not taking: Reported on 07/19/2014) 20 capsule 1   . enoxaparin (LOVENOX) 40 MG/0.4ML injection Inject 0.4 mLs (40 mg total) into the skin daily. For 1 month (Patient not taking: Reported on 06/29/2014) 14 Syringe 0   . HYDROcodone-acetaminophen (NORCO)  7.5-325 MG per tablet Take 1-2 tablets by mouth every 4 (four) hours as needed for moderate pain. (Patient not taking: Reported on 06/28/2014) 40 tablet 0   . ondansetron (ZOFRAN) 4 MG tablet Take 1 tablet (4 mg total) by mouth every 6 (six) hours as needed for nausea. (Patient not taking: Reported on 07/07/2014) 20 tablet 0   . senna (SENOKOT) 8.6 MG TABS tablet Take 2 tablets (17.2 mg total) by mouth daily as needed for mild constipation. (Patient not taking: Reported on 07/13/2014) 120 each 0     Family History  Problem Relation Age of Onset  . Diabetes Mother   . Diabetes Father   . Heart failure Mother   . Heart failure Father   . Stroke Father   . Diabetes Sister   . Diabetes Sister   . Diabetes Brother      Review of Systems:   The patient is right-hand dominant but his right hand is minimally functional He denies thoracic trauma or thoracic surgery other than a Port-A-Cath in the past He is in a sinus rhythm He has very poor dentition with necrotic and loose teeth    Cardiac Review of Systems: Y or N  Chest Pain [   no ]  Resting SOB [email or web address]   ] Exertional SOB  [place]er  ]  Orthopnea [place]  ]   Pedal Edema [ yes  ]    Palpitations [ no ] Syncope  [ no ]   Presyncope [ no  ]  General Review of Systems: [Y] = yes [  ]=no Constitional: recent weight change [place]er  ]; anorexia [ yes ]; fatig ue [place]er  ]; nausea [place]er  ]; night sweats [  ];  fever [  ]; or chills [  ]                                                               Dental: poor dentition[yes  ]; Last Dentist visit: Remote  Eye : blurred vision [  ]; diplopia [   ]; vision changes [  ];  Amaurosis fugax[  ]; Resp: cough [  ];  wheezing[  ];  hemoptysis[  ]; shortness of breath[  ]; paroxysmal nocturnal dyspnea[  ]; dyspnea on exertion[  yes]; or orthopnea[yes  ];  GI:  gallstones[  ], vomiting[  ];  dysphagia[  ]; melena[  ];  hematochezia [  ]; heartburn[  ];   Hx of  Colonoscopy[  ]; GU: kidney stones [  ]; hematuria[  ];    dysuria [  ];  nocturia[  ];  history of     obstruction [  ]; urinary frequency [  ]             Skin: rash, swelling[  ];, hair loss[  ];  peripheral edema[  ];  or itching[  ]; Musculosketetal: myalgias[  ];  joint swelling[  ];  joint erythema[  ];  joint pain[  ];  back pain[  ];  Heme/Lymph: bruising[  ];  bleeding[  ];  anemia[  ];  Neuro: TIA[  ];  headaches[  ];  stroke[  ];  vertigo[  ];  seizures[  ];   paresthesias[  ];  difficulty walking[  ];  Psych:depression[  ]; anxiety[  ];  Endocrine: diabetes[yes  ];  thyroid dysfunction[  ];  Immunizations: Flu [  ]; Pneumococcal[  ];  Other:  Physical Exam: BP 93/48 mmHg  Pulse 92  Temp(Src) 98.7 F (37.1 C) (Oral)  Resp 20  Ht 5\' 6"  (1.676 m)  Wt 150 lb (68.04 kg)  BMI 24.22 kg/m2  SpO2 98%  Gen. appearance-middle-aged Caucasian male chronically ill company by 2 sisters HEENT-normocephalic, pupils equal, severe dental disease and loose teeth Neck-JVD positive, palpable carotid pulses Thorax-diminished breath sounds at the bases from pleural effusions Cardiac-regular rhythm, no murmur detected Abdomen-probable hepatomegaly, no pulsatile mass Extremities-soft tissue scarring of both upper extremities from previous necrotizing fasciitis, left BKA well-healed stump, right leg warm no evidence of varicosities Vascular-compression dressing over left femoral artery at cardiac cath site, palpable right femoral pulse  Diagnostic Studies & Laboratory data:     Recent Radiology Findings:   No results found.    Recent Lab Findings: Lab Results  Component Value Date   WBC 9.7 07/09/2014   HGB 15.0 07/18/2014   HCT 46.1 07/18/2014   PLT 172 07/11/2014   GLUCOSE 116* 09/14/2013   CHOL 76 07/06/2012   TRIG 194* 07/06/2012   HDL 10* 07/06/2012   LDLCALC 27 07/06/2012   ALT 14 09/12/2013   AST 20 09/12/2013   NA 140 09/14/2013   K 4.1 09/14/2013   CL 102 09/14/2013   CREATININE 1.87* 07/07/2014   BUN 19 09/14/2013   CO2 25  09/14/2013   INR 1.40 06/30/2012   HGBA1C 10.9* 07/05/2012      Assessment / Plan:     Ischemic cardiomyopathy with advanced heart failure and ischemic MR    Poor targets  for grafting, unknown status of potential conduit for grafting   Severe comorbid problems with peripheral vascular disease and 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  We will follow. Recommend consult to advanced heart failure service.      @ME1 @ 07/21/2014 7:15 PM

## 2014-07-10 NOTE — Progress Notes (Addendum)
Paged by nursing staff for 10 beats run of NSVT at 7:02pm, review of telemetry shows frequent PVCs and 7 beats run of NSVT at 6:00pm. Patient is asymptomatic. Cath earlier today shows severe cardiomyopathy with EF 20% and 3v disease. Seen by CT surgery Dr. Prescott Gum, felt to have poor targets for grafting. Pending further workup to determine if a CABG candidate.   NSVT likely related to severe ischemic cardiomyopathy and underling known CAD. Will check K and Mg. Replete if necessary to keep K >4.0 and Mg > 2.0. If have prolonged VT, may need amiodarone. Continue to monitor for now.  Hilbert Corrigan PA Pager: [phone]

## 2014-07-10 NOTE — Interval H&P Note (Signed)
History and Physical Interval Note:    Patient seen and examined.  No interval change in history and exam since last note.  Stable for procedure.  Kerry Hough. MD [facility]  07/03/2014 1:15 PM

## 2014-07-10 NOTE — CV Procedure (Signed)
Cardiac Catheterization Report   Tyler Erickson    64 y.o.  male  DOB: 1951/03/23  MRN: [id number]  07/21/2014    PROCEDURE:  Left heart catheterization with selective coronary angiography, left ventriculogram.  INDICATIONS:  Severe left ventricular dysfunction.  The patient with known atherosclerosis and clinical story suggestive of unstable angina, prior left BK amputation and severe disease involving both axillary arteries.  The risks, benefits, and details of the procedure were explained to the patient.  The patient verbalized understanding and wanted to proceed.  Informed written consent was obtained.  PROCEDURE TECHNIQUE:  After Xylocaine anesthesia a 50F sheath was placed in the left right femoral artery with some difficulty.  Left coronary angiography was done using a Judkins L4 guide catheter initially.  There was significant ventricularization and damping with the 5 [place] catheter.  After 1 view was changed for a 4 [place] Judkins left catheter that did not damp, but appeared show a severe ostial stenosis.  Right coronary angiography in 1 view was done using a Judkins R4 guide catheter.  No ventriculogram was performed.  He was then returned to the holding area and will be admitted for observation.   CONTRAST:  Total of 20 cc.\  ESTIMATED BLOOD LOSS:  Normal COMPLICATIONS:  None.    HEMODYNAMICS:  Aorta is 115/80, left ventricular pressures not done  ANGIOGRAPHIC DATA:    CORONARY ARTERIES:   Arise and distribute normally.  Right dominant.  Moderate coronary calcification is noted.  Left main coronary artery: Severe ostial left main disease  Left anterior descending: Severe 90% proximal stenosis, severe diagonal stenosis which is segmental  Circumflex coronary artery: 70-80% circumflex stenosis.  Right coronary artery: Occluded in its midportion, severely diseased proximally, collateral filling is seen from the left coronary  artery.  LEFT VENTRICULOGRAM:  Not done due to severe left main disease and adequate echo  IMPRESSIONS:  1. Severe left main and three-vessel coronary artery disease 2. Severely depressed ventricular function  RECOMMENDATION:  We'll keep in the hospital for observation, surgical consult, although is high risk for surgery also.  Kerry Hough MD [facility]

## 2014-07-10 NOTE — Progress Notes (Signed)
  Echocardiogram 2D Echocardiogram has been performed.  Tyler Erickson 07/15/2014, 5:04 PM

## 2014-07-11 ENCOUNTER — Observation Stay (HOSPITAL_COMMUNITY): Payer: Commercial Managed Care - HMO

## 2014-07-11 ENCOUNTER — Other Ambulatory Visit: Payer: Self-pay | Admitting: *Deleted

## 2014-07-11 DIAGNOSIS — Z01810 Encounter for preprocedural cardiovascular examination: Secondary | ICD-10-CM

## 2014-07-11 DIAGNOSIS — I2510 Atherosclerotic heart disease of native coronary artery without angina pectoris: Secondary | ICD-10-CM | POA: Diagnosis present

## 2014-07-11 DIAGNOSIS — R34 Anuria and oliguria: Secondary | ICD-10-CM | POA: Diagnosis not present

## 2014-07-11 DIAGNOSIS — Z85038 Personal history of other malignant neoplasm of large intestine: Secondary | ICD-10-CM | POA: Diagnosis not present

## 2014-07-11 DIAGNOSIS — I748 Embolism and thrombosis of other arteries: Secondary | ICD-10-CM | POA: Diagnosis present

## 2014-07-11 DIAGNOSIS — I2582 Chronic total occlusion of coronary artery: Secondary | ICD-10-CM | POA: Diagnosis present

## 2014-07-11 DIAGNOSIS — E1142 Type 2 diabetes mellitus with diabetic polyneuropathy: Secondary | ICD-10-CM | POA: Diagnosis present

## 2014-07-11 DIAGNOSIS — N184 Chronic kidney disease, stage 4 (severe): Secondary | ICD-10-CM | POA: Diagnosis present

## 2014-07-11 DIAGNOSIS — Z8673 Personal history of transient ischemic attack (TIA), and cerebral infarction without residual deficits: Secondary | ICD-10-CM | POA: Diagnosis not present

## 2014-07-11 DIAGNOSIS — I252 Old myocardial infarction: Secondary | ICD-10-CM | POA: Diagnosis not present

## 2014-07-11 DIAGNOSIS — I739 Peripheral vascular disease, unspecified: Secondary | ICD-10-CM | POA: Diagnosis present

## 2014-07-11 DIAGNOSIS — R570 Cardiogenic shock: Secondary | ICD-10-CM | POA: Diagnosis not present

## 2014-07-11 DIAGNOSIS — K567 Ileus, unspecified: Secondary | ICD-10-CM | POA: Diagnosis not present

## 2014-07-11 DIAGNOSIS — I25700 Atherosclerosis of coronary artery bypass graft(s), unspecified, with unstable angina pectoris: Secondary | ICD-10-CM

## 2014-07-11 DIAGNOSIS — E1122 Type 2 diabetes mellitus with diabetic chronic kidney disease: Secondary | ICD-10-CM | POA: Diagnosis present

## 2014-07-11 DIAGNOSIS — Z794 Long term (current) use of insulin: Secondary | ICD-10-CM | POA: Diagnosis not present

## 2014-07-11 DIAGNOSIS — T508X5A Adverse effect of diagnostic agents, initial encounter: Secondary | ICD-10-CM | POA: Diagnosis not present

## 2014-07-11 DIAGNOSIS — Z7401 Bed confinement status: Secondary | ICD-10-CM | POA: Diagnosis not present

## 2014-07-11 DIAGNOSIS — I5021 Acute systolic (congestive) heart failure: Secondary | ICD-10-CM

## 2014-07-11 DIAGNOSIS — I5023 Acute on chronic systolic (congestive) heart failure: Secondary | ICD-10-CM | POA: Diagnosis present

## 2014-07-11 DIAGNOSIS — E1151 Type 2 diabetes mellitus with diabetic peripheral angiopathy without gangrene: Secondary | ICD-10-CM | POA: Diagnosis present

## 2014-07-11 DIAGNOSIS — N179 Acute kidney failure, unspecified: Secondary | ICD-10-CM

## 2014-07-11 DIAGNOSIS — I471 Supraventricular tachycardia: Secondary | ICD-10-CM | POA: Diagnosis not present

## 2014-07-11 DIAGNOSIS — I469 Cardiac arrest, cause unspecified: Secondary | ICD-10-CM | POA: Diagnosis not present

## 2014-07-11 DIAGNOSIS — I4892 Unspecified atrial flutter: Secondary | ICD-10-CM | POA: Diagnosis not present

## 2014-07-11 DIAGNOSIS — I255 Ischemic cardiomyopathy: Secondary | ICD-10-CM | POA: Diagnosis present

## 2014-07-11 DIAGNOSIS — Z833 Family history of diabetes mellitus: Secondary | ICD-10-CM | POA: Diagnosis not present

## 2014-07-11 DIAGNOSIS — Z89512 Acquired absence of left leg below knee: Secondary | ICD-10-CM | POA: Diagnosis not present

## 2014-07-11 DIAGNOSIS — E871 Hypo-osmolality and hyponatremia: Secondary | ICD-10-CM | POA: Diagnosis present

## 2014-07-11 DIAGNOSIS — F17210 Nicotine dependence, cigarettes, uncomplicated: Secondary | ICD-10-CM | POA: Diagnosis present

## 2014-07-11 DIAGNOSIS — E875 Hyperkalemia: Secondary | ICD-10-CM | POA: Diagnosis present

## 2014-07-11 DIAGNOSIS — E43 Unspecified severe protein-calorie malnutrition: Secondary | ICD-10-CM | POA: Diagnosis present

## 2014-07-11 DIAGNOSIS — I472 Ventricular tachycardia: Secondary | ICD-10-CM | POA: Diagnosis not present

## 2014-07-11 DIAGNOSIS — Z66 Do not resuscitate: Secondary | ICD-10-CM | POA: Diagnosis present

## 2014-07-11 DIAGNOSIS — N141 Nephropathy induced by other drugs, medicaments and biological substances: Secondary | ICD-10-CM | POA: Diagnosis not present

## 2014-07-11 DIAGNOSIS — E872 Acidosis: Secondary | ICD-10-CM | POA: Diagnosis not present

## 2014-07-11 DIAGNOSIS — I493 Ventricular premature depolarization: Secondary | ICD-10-CM | POA: Diagnosis not present

## 2014-07-11 DIAGNOSIS — Z515 Encounter for palliative care: Secondary | ICD-10-CM | POA: Diagnosis not present

## 2014-07-11 LAB — COMPREHENSIVE METABOLIC PANEL
ALT: 13 U/L (ref 0–53)
AST: 19 U/L (ref 0–37)
Albumin: 3.2 g/dL — ABNORMAL LOW (ref 3.5–5.2)
Alkaline Phosphatase: 98 U/L (ref 39–117)
Anion gap: 16 — ABNORMAL HIGH (ref 5–15)
BUN: 31 mg/dL — ABNORMAL HIGH (ref 6–23)
CO2: 27 mmol/L (ref 19–32)
Calcium: 9 mg/dL (ref 8.4–10.5)
Chloride: 99 mEq/L (ref 96–112)
Creatinine, Ser: 1.81 mg/dL — ABNORMAL HIGH (ref 0.50–1.35)
GFR calc Af Amer: 45 mL/min — ABNORMAL LOW (ref >90)
GFR calc non Af Amer: 38 mL/min — ABNORMAL LOW (ref >90)
Glucose, Bld: 181 mg/dL — ABNORMAL HIGH (ref 70–99)
Potassium: 4.2 mmol/L (ref 3.5–5.1)
Sodium: 142 mmol/L (ref 135–145)
Total Bilirubin: 0.9 mg/dL (ref 0.3–1.2)
Total Protein: 6.5 g/dL (ref 6.0–8.3)

## 2014-07-11 LAB — HEMOGLOBIN A1C
HEMOGLOBIN A1C: 8.5 % — AB (ref <5.7)
Hgb A1c MFr Bld: 8.5 % — ABNORMAL HIGH (ref <5.7)
Mean Plasma Glucose: 197 mg/dL — ABNORMAL HIGH (ref <117)
Mean Plasma Glucose: 197 mg/dL — ABNORMAL HIGH (ref <117)

## 2014-07-11 LAB — GLUCOSE, CAPILLARY
GLUCOSE-CAPILLARY: 141 mg/dL — AB (ref 70–99)
GLUCOSE-CAPILLARY: 165 mg/dL — AB (ref 70–99)
GLUCOSE-CAPILLARY: 160 mg/dL — AB (ref 70–99)
Glucose-Capillary: 82 mg/dL (ref 70–99)

## 2014-07-11 LAB — PREALBUMIN: Prealbumin: 13.9 mg/dL — ABNORMAL LOW (ref 17.0–34.0)

## 2014-07-11 MED ORDER — CARVEDILOL 3.125 MG PO TABS
3.1250 mg | ORAL_TABLET | Freq: Two times a day (BID) | ORAL | Status: DC
  Administered 2014-07-11 – 2014-07-12 (×3): 3.125 mg via ORAL
  Filled 2014-07-11 (×4): qty 1

## 2014-07-11 MED ORDER — CHLORHEXIDINE GLUCONATE 0.12 % MT SOLN
15.0000 mL | Freq: Two times a day (BID) | OROMUCOSAL | Status: DC
  Administered 2014-07-11 – 2014-07-13 (×3): 15 mL via OROMUCOSAL
  Filled 2014-07-11 (×6): qty 15

## 2014-07-11 MED ORDER — POTASSIUM CHLORIDE ER 10 MEQ PO TBCR
20.0000 meq | EXTENDED_RELEASE_TABLET | Freq: Two times a day (BID) | ORAL | Status: DC
  Administered 2014-07-11: 20 meq via ORAL
  Filled 2014-07-11 (×3): qty 2

## 2014-07-11 MED ORDER — FUROSEMIDE 10 MG/ML IJ SOLN
40.0000 mg | Freq: Two times a day (BID) | INTRAMUSCULAR | Status: DC
  Administered 2014-07-11 – 2014-07-12 (×3): 40 mg via INTRAVENOUS
  Filled 2014-07-11 (×4): qty 4

## 2014-07-11 MED ORDER — ATORVASTATIN CALCIUM 80 MG PO TABS
80.0000 mg | ORAL_TABLET | Freq: Every day | ORAL | Status: DC
  Administered 2014-07-11 – 2014-07-19 (×9): 80 mg via ORAL
  Filled 2014-07-11 (×12): qty 1

## 2014-07-11 MED ORDER — SODIUM CHLORIDE 0.9 % IV SOLN: INTRAVENOUS | Status: DC

## 2014-07-11 MED ORDER — FUROSEMIDE 10 MG/ML IJ SOLN
40.0000 mg | Freq: Once | INTRAMUSCULAR | Status: AC
  Administered 2014-07-11: 15:00:00 40 mg via INTRAVENOUS
  Filled 2014-07-11: qty 4

## 2014-07-11 MED ORDER — ENSURE COMPLETE PO LIQD
237.0000 mL | Freq: Two times a day (BID) | ORAL | Status: DC
  Filled 2014-07-11 (×5): qty 237

## 2014-07-11 NOTE — Progress Notes (Signed)
Pt had 10 beats of Vtach, pt in no distress, denied CP, SOB, vitals signs stable as charted.  PA [facility] informed, new orders written for lab written.  Will continue to monitor patient.

## 2014-07-11 NOTE — Progress Notes (Signed)
Subjective:  C/O mild SOB today.  Some nonsustained VT overnight.   Objective:  Vital Signs in the last 24 hours: BP 106/70 mmHg  Pulse 100  Temp(Src) 98 F (36.7 C) (Oral)  Resp 20  Ht 5\' 6"  (1.676 m)  Wt 68 kg (149 lb 14.6 oz)  BMI 24.21 kg/m2  SpO2 94%  Physical Exam: WM in NAD Lungs:  Clear  Cardiac:  Regular rhythm, normal S1 and S2, no S3 Abdomen:  Soft, nontender, no masses Extremities:  Cath site clean and dry  Intake/Output from previous day: 01/13 0701 - 01/14 0700 In: 1252.9 [P.O.:200; I.V.:1052.9] Out: 400 [Urine:400] Weight Filed Weights   07/14/2014 1134 07/11/14 0000  Weight: 68.04 kg (150 lb) 68 kg (149 lb 14.6 oz)    Lab Results: Basic Metabolic Panel:  Recent Labs  07/01/2014 2030 07/11/14 0258  NA 138 142  K 3.9 4.2  CL 102 99  CO2 27 27  GLUCOSE 222* 181*  BUN 32* 31*  CREATININE 1.77* 1.81*    CBC:  Recent Labs  07/09/2014 1213 07/12/2014 1543  WBC 10.8* 9.7  HGB 15.5 15.0  HCT 47.0 46.1  MCV 85.1 87.1  PLT 168 172    Telemetry: Sinus with PVC's some VT.  Assessment/Plan:   1.  Severe left ventricular dysfunction with EF of 20%, new since 2014. 2.  Severe left main ostial and three-vessel coronary artery disease. 3.  Stage 3-4 chronic kidney disease. 4.  Malnutrition. 5.  Diabetes. 6.  Severe peripheral vascular disease with bilateral occlusions of his brachial vessels  Recommendations:  Advanced heart failure to see.  I think at this point, the best thing might do would be to look at a rest redistribution thallium test because of the renal insufficiency and gadolinium issues.  We'll also need vein mapping and whether or not his mammary arteries are patent and figure out what the best way to look at that would be in light of his renal insufficiency.     Kerry Hough  MD [facility]  07/11/2014, 1:35 PM

## 2014-07-11 NOTE — Consult Note (Addendum)
Primary Cardiologist: Dr. Wynonia Lawman Reason for Consultation: Heart failure/CAD   HPI:  Tyler Erickson is a 64 year old male with CKD, previous tobacco use (cigars), PAD s/p L BKA, 3-V CAD and acute systolic HF with EF 47%. We are asked to see due to advanced HF.  The patient had a severe prolonged critical illness in 2014 when he developed wet gangrene of his left foot with sepsis requiring left BKA. He had multisystem failure as well as soft tissue necrosis of each upper extremity requiring plastic surgery. He survived and was able to ambulate with his prosthesis however he fell this past summer and fractured his left hip requiring a hip pinning. Echo 1/14 EF 55-60%.   Over the past month has been having exertional dyspnea and CP. Very limited functional capacity though he is able to do ADLs. Saw Dr. Wynonia Lawman and echo with EF 20-25% with moderate MR. ECG with anterior Q waves. Underwent cath with diabetic vasculopathy. Diffuse CAD with high-grade LM disease, 90% proximal to mid LAD lesion, 80% LCX lesion and occluded RCA. He also has occlusion of his right axillary artery and left axillary artery by vascular ultrasound studies  Seen by Dr. Prescott Gum who felt patient not good candidate for CABG. Suggested AHF consult. Currently has mild dyspnea at rest and chest tightness. This is improved since previous. Last year renal function was normal. Cr on admit 1.7-1.9.     Review of Systems:     Cardiac Review of Systems: {Y] = yes [ ]  = no  Chest Pain [ y   ]  Resting SOB [ y  ] Exertional SOB  [  y]  Orthopnea [  ]   Pedal Edema [   ]    Palpitations [  ] Syncope  [  ]   Presyncope [   ]  General Review of Systems: [Y] = yes [  ]=no Constitional: recent weight change [  ]; anorexia [  ]; fatigue [  y]; nausea Blue.Reese  ]; night sweats [  ]; fever [  ]; or chills [  ];                                                                      Eyes : blurred vision [  ]; diplopia [   ]; vision changes [  ];   Amaurosis fugax[  ]; Resp: cough [  ];  wheezing[  ];  hemoptysis[  ];  PND [  ];  GI:  gallstones[  ], vomiting[  ];  dysphagia[  ]; melena[  ];  hematochezia [  ]; heartburn[  ];   GU: kidney stones [  ]; hematuria[  ];   dysuria [  ];  nocturia[  ]; incontinence [  ];             Skin: rash, swelling[  ];, hair loss[  ];  peripheral edema[  ];  or itching[  ]; Musculosketetal: myalgias[  ];  joint swelling[  ];  joint erythema[  ];  joint pain[y  ];  back pain[  ];  Heme/Lymph: bruising[  ];  bleeding[  ];  anemia[  ];  Neuro: TIA[  ];  headaches[  ];  stroke[  ];  vertigo[  ];  seizures[  ];   paresthesias[  ];  difficulty walking[y  ];  Psych:depression[  ]; anxiety[  ];  Endocrine: diabetes[ y ];  thyroid dysfunction[  ];  Other:  Past Medical History  Diagnosis Date  . History of colon cancer     S/P surgery/chemotherapy  . Peripheral sensory neuropathy due to type 2 diabetes mellitus   . CVA (cerebral infarction) 06/2012    [name] 07/08/2014  . Kidney stones   . S/P unilateral BKA (below knee amputation) left 07/28/2012  . Stage 3 chronic kidney disease due to type 2 diabetes mellitus 07/08/2014  . Ischemic cardiomyopathy   . Cervical spinal stenosis   . Coronary artery disease   . Colon cancer   . Heart murmur   . Peripheral vascular disease     involving both arms/notes 07/08/2014  . Atherosclerosis of arteries of extremities 07/08/2014  . Type 2 diabetes mellitus with vascular disease 03/14/2009  . Pleural effusion 06/2012    [name] 07/08/2014  . CHF (congestive heart failure)     systolic/notes 2/70/6237  . Myocardial infarction     evidence of a previous old anterior MI on his EKG /notes 07/08/2014    Medications Prior to Admission  Medication Sig Dispense Refill  . aspirin 81 MG tablet Take 81 mg by mouth daily.    . furosemide (LASIX) 40 MG tablet Take 1 tablet by mouth 2 (two) times daily.    . isosorbide mononitrate (IMDUR) 30 MG 24 hr tablet Take 1 tablet by mouth  daily.    Marland Kitchen acetaminophen (TYLENOL) 325 MG tablet Take 2 tablets (650 mg total) by mouth every 6 (six) hours as needed for mild pain (or Fever >/= 101). (Patient not taking: Reported on 07/09/2014)    . bisacodyl (DULCOLAX) 10 MG suppository Place 1 suppository (10 mg total) rectally as needed for moderate constipation. (Patient not taking: Reported on 07/09/2014) 12 suppository 0  . docusate sodium (COLACE) 100 MG capsule Take 1 capsule (100 mg total) by mouth 2 (two) times daily as needed for mild constipation. (Patient not taking: Reported on 07/21/2014) 20 capsule 1  . enoxaparin (LOVENOX) 40 MG/0.4ML injection Inject 0.4 mLs (40 mg total) into the skin daily. For 1 month (Patient not taking: Reported on 07/15/2014) 14 Syringe 0  . HYDROcodone-acetaminophen (NORCO) 7.5-325 MG per tablet Take 1-2 tablets by mouth every 4 (four) hours as needed for moderate pain. (Patient not taking: Reported on 07/25/2014) 40 tablet 0  . ondansetron (ZOFRAN) 4 MG tablet Take 1 tablet (4 mg total) by mouth every 6 (six) hours as needed for nausea. (Patient not taking: Reported on 07/03/2014) 20 tablet 0  . senna (SENOKOT) 8.6 MG TABS tablet Take 2 tablets (17.2 mg total) by mouth daily as needed for mild constipation. (Patient not taking: Reported on 07/18/2014) 120 each 0     . aspirin EC  81 mg Oral Daily  . carvedilol  3.125 mg Oral BID WC  . chlorhexidine  15 mL Mouth/Throat BID  . chlorpheniramine-HYDROcodone  5 mL Oral Once  . enoxaparin (LOVENOX) injection  30 mg Subcutaneous Q24H  . feeding supplement (ENSURE COMPLETE)  237 mL Oral BID BM  . insulin aspart  0-9 Units Subcutaneous TID WC  . isosorbide mononitrate  30 mg Oral Daily    Infusions: . sodium chloride 10 mL/hr at 07/11/14 1344    No Known Allergies  History   Social History  . Marital Status: Single  Spouse Name: N/A    Number of Children: 0  . Years of Education: 12   Occupational History  . Disabled since diagnosed with colon  cancer   . Glass blower/designer prior to disability    Social History Main Topics  . Smoking status: Former Smoker -- 25 years    Types: Cigars    Start date: 06/29/2012    Quit date: 05/28/2014  . Smokeless tobacco: Never Used  . Alcohol Use: No  . Drug Use: No  . Sexual Activity: Not Currently   Other Topics Concern  . Not on file   Social History Narrative   Single.  Lives with his brother.  No children.      Family History  Problem Relation Age of Onset  . Diabetes Mother   . Diabetes Father   . Heart failure Mother   . Heart failure Father   . Stroke Father   . Diabetes Sister   . Diabetes Sister   . Diabetes Brother     PHYSICAL EXAM: Filed Vitals:   07/11/14 0747  BP: 106/70  Pulse: 100  Temp: 98 F (36.7 C)  Resp: 20     Intake/Output Summary (Last 24 hours) at 07/11/14 1642 Last data filed at 07/11/14 1506  Gross per 24 hour  Intake 2656.87 ml  Output    500 ml  Net 2156.87 ml    General:  Chronically ill appearing. Mildly dyspneic.  HEENT: normal x for poor dentition Neck: supple. JVP 10. Carotids 2+ bilat; no bruits. No lymphadenopathy or thryomegaly appreciated. Cor: PMI nondisplaced. Tachy regular. + 2/6 MR Lungs: decreased throughout. Mild basilar crackles Abdomen: soft, nontender, nondistended. No hepatosplenomegaly. No bruits or masses. Good bowel sounds. Extremities: no cyanosis, clubbing, rash, edema. + L BKA Neuro: alert & oriented x 3, cranial nerves grossly intact. moves all 4 extremities w/o difficulty. Affect pleasant.  ECG: NSR with anterior Q waves.   Results for orders placed or performed during the hospital encounter of 07/21/2014 (from the past 24 hour(s))  Glucose, capillary     Status: Abnormal   Collection Time: 07/13/2014  6:11 PM  Result Value Ref Range   Glucose-Capillary 200 (H) 70 - 99 mg/dL  Brain natriuretic peptide     Status: Abnormal   Collection Time: 07/20/2014  7:13 PM  Result Value Ref Range   B Natriuretic  Peptide 1312.4 (H) 0.0 - 100.0 pg/mL  Surgical pcr screen     Status: None   Collection Time: 06/30/2014  7:56 PM  Result Value Ref Range   MRSA, PCR NEGATIVE NEGATIVE   Staphylococcus aureus NEGATIVE NEGATIVE  Urinalysis, Routine w reflex microscopic     Status: Abnormal   Collection Time: 07/21/2014  7:56 PM  Result Value Ref Range   Color, Urine YELLOW YELLOW   APPearance HAZY (A) CLEAR   Specific Gravity, Urine 1.029 1.005 - 1.030   pH 6.5 5.0 - 8.0   Glucose, UA NEGATIVE NEGATIVE mg/dL   Hgb urine dipstick NEGATIVE NEGATIVE   Bilirubin Urine SMALL (A) NEGATIVE   Ketones, ur 15 (A) NEGATIVE mg/dL   Protein, ur 100 (A) NEGATIVE mg/dL   Urobilinogen, UA 1.0 0.0 - 1.0 mg/dL   Nitrite NEGATIVE NEGATIVE   Leukocytes, UA NEGATIVE NEGATIVE  Urine microscopic-add on     Status: None   Collection Time: 07/19/2014  7:56 PM  Result Value Ref Range   Squamous Epithelial / LPF RARE RARE   WBC, UA 0-2 <3 WBC/hpf   RBC /  HPF 0-2 <3 RBC/hpf   Bacteria, UA RARE RARE  Basic metabolic panel     Status: Abnormal   Collection Time: 07/05/2014  8:30 PM  Result Value Ref Range   Sodium 138 135 - 145 mmol/L   Potassium 3.9 3.5 - 5.1 mmol/L   Chloride 102 96 - 112 mEq/L   CO2 27 19 - 32 mmol/L   Glucose, Bld 222 (H) 70 - 99 mg/dL   BUN 32 (H) 6 - 23 mg/dL   Creatinine, Ser 1.77 (H) 0.50 - 1.35 mg/dL   Calcium 8.7 8.4 - 10.5 mg/dL   GFR calc non Af Amer 39 (L) >90 mL/min   GFR calc Af Amer 46 (L) >90 mL/min   Anion gap 9 5 - 15  Magnesium     Status: None   Collection Time: 07/07/2014  8:30 PM  Result Value Ref Range   Magnesium 2.1 1.5 - 2.5 mg/dL  Glucose, capillary     Status: Abnormal   Collection Time: 07/09/2014  9:38 PM  Result Value Ref Range   Glucose-Capillary 199 (H) 70 - 99 mg/dL   Comment 1 Notify RN    Comment 2 Documented in Chart   Comprehensive metabolic panel     Status: Abnormal   Collection Time: 07/11/14  2:58 AM  Result Value Ref Range   Sodium 142 135 - 145 mmol/L    Potassium 4.2 3.5 - 5.1 mmol/L   Chloride 99 96 - 112 mEq/L   CO2 27 19 - 32 mmol/L   Glucose, Bld 181 (H) 70 - 99 mg/dL   BUN 31 (H) 6 - 23 mg/dL   Creatinine, Ser 1.81 (H) 0.50 - 1.35 mg/dL   Calcium 9.0 8.4 - 10.5 mg/dL   Total Protein 6.5 6.0 - 8.3 g/dL   Albumin 3.2 (L) 3.5 - 5.2 g/dL   AST 19 0 - 37 U/L   ALT 13 0 - 53 U/L   Alkaline Phosphatase 98 39 - 117 U/L   Total Bilirubin 0.9 0.3 - 1.2 mg/dL   GFR calc non Af Amer 38 (L) >90 mL/min   GFR calc Af Amer 45 (L) >90 mL/min   Anion gap 16 (H) 5 - 15  Prealbumin     Status: Abnormal   Collection Time: 07/11/14  2:58 AM  Result Value Ref Range   Prealbumin 13.9 (L) 17.0 - 34.0 mg/dL  Glucose, capillary     Status: None   Collection Time: 07/11/14  6:18 AM  Result Value Ref Range   Glucose-Capillary 82 70 - 99 mg/dL   Comment 1 Notify RN    Comment 2 Documented in Chart   Glucose, capillary     Status: Abnormal   Collection Time: 07/11/14 12:45 PM  Result Value Ref Range   Glucose-Capillary 141 (H) 70 - 99 mg/dL   Dg Orthopantogram  07/11/2014   CLINICAL DATA:  64 year old male with frontal mouth pain.  EXAM: ORTHOPANTOGRAM/PANORAMIC  COMPARISON:  None.  FINDINGS: Evaluation limited by motion artifact. There are numerous missing and carious teeth. The most notably carious teeth are the right maxillary second premolar and second molar. There appears to be a relatively large periapical lucency about the root of the left submandibular frontal incisor concerning for possible periapical abscess.  IMPRESSION: 1. Large periapical lucency in the frontal mandible surrounding the root of the left central mandibular incisor consistent with periodontal disease and raising the possibility of periapical abscess. 2. Numerous missing and carious teeth.   Electronically  Signed   By: Jacqulynn Cadet M.D.   On: 07/11/2014 11:59   Dg Chest 2 View  07/11/2014   CLINICAL DATA:  Initial evaluation cough  EXAM: CHEST  2 VIEW  COMPARISON:  06/26/2014   FINDINGS: Mild cardiac enlargement and vascular congestion with mild interstitial prominence. Limited inspiratory effect. Blunting of both costophrenic angles with small to moderate pleural effusions stable from prior study. Bibasilar airspace opacities appear essentially identical and likely represent atelectasis.  IMPRESSION: Small moderate bilateral pleural effusions unchanged. Suspect mild interstitial pulmonary edema.   Electronically Signed   By: Skipper Cliche M.D.   On: 07/11/2014 11:16   Ct Chest Wo Contrast  07/11/2014   CLINICAL DATA:  Increasing short of breath, ischemic cardiomyopathy.  EXAM: CT CHEST WITHOUT CONTRAST  TECHNIQUE: Multidetector CT imaging of the chest was performed following the standard protocol without IV contrast.  COMPARISON:  Chest CT 07/08/2012  FINDINGS: Mediastinum/Nodes: No axillary supraclavicular adenopathy.  No pericardial fluid. Esophagus gas-filled without evidence of inflammation. No pericardial fluid coronary calcifications are present.  Lungs/Pleura: Bilateral pleural effusions similar comparison exam. There is mild bibasilar atelectasis which is passive in nature. There is no airspace disease suggests acute pulmonary edema.  Upper abdomen: Limited view of the liver, kidneys, pancreas are unremarkable.  Musculoskeletal: No aggressive osseous lesion.  IMPRESSION: 1. Bilateral pleural effusions which are moderate in volume with associated passive atelectasis. No significant change from comparison exam. 2. No overt pulmonary edema. 3. Coronary calcifications.   Electronically Signed   By: Suzy Bouchard M.D.   On: 07/11/2014 11:39     ASSESSMENT: 1. Acute systolic heart failure due to iCM EF 20% 2. 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 DM2 4. PAD s/p L BKA 5. Acute renal failure 6. Severe protein-calorie malnutrition pre-albumin 13.9   PLAN/DISCUSSION:  I have seen and examined patient and reviewed cath films personally. I have also discussed  the case with Drs. Wynonia Lawman and [place]. Mr. Brutus has severe CAD due to diabetic vasculopathy with high grade ostial LM disease. This is complicated by acute HF due to severe ischemic cardiomyopathy with EF 20% as well as poor nutritional and functional status.   I agree with Dr. Prescott Gum that he is a very poor candidate for surgical revascularization. I have reviewed his cath films with Dr. Burt Knack who feels his LM, LAD and LCX lesions are approachable percutaneously with high-risk PCI. I feel this is his only viable option.  Unfortunately, he will not be a candidate for Impella support due to severe PAD. Will plan to transfer to [place] in am for Swan placement and pre-PCI optimization with inotropes as needed. Will diurese as tolerated. No ACE or ARB due to AKI.  Start statin. If CP recurs will start heparin. Will discuss timing of Plavix load with Dr. Burt Knack.   I discussed the options with him and his sister and they are willing to proceed.  Time spent 45 minutes.   Jennifer Payes,MD 5:00 PM

## 2014-07-11 NOTE — Progress Notes (Signed)
UR completed 

## 2014-07-11 NOTE — Progress Notes (Signed)
INITIAL NUTRITION ASSESSMENT  DOCUMENTATION CODES Per approved criteria  -Not Applicable   INTERVENTION: Ensure Complete po BID, each supplement provides 350 kcal and 13 grams of protein  NUTRITION DIAGNOSIS: Increased nutrient needs related to wound healing as evidenced by estimated needs.   Goal: Pt to meet >/= 90% of their estimated nutrition needs   Monitor:  PO intake, labs  Reason for Assessment: MD Consult  64 y.o. male  Admitting Dx:  Dyspnea  ASSESSMENT: 64 year old diabetic was admitted to the hospital today after coronary angiogram to evaluate ischemic cardiomyopathy and class IV heart failure. He is a nonsmoker but has severe peripheral vascular disease. His echocardiogram shows severe LV dysfunction with LV dilatation, EF 2 20-25 percent, moderate ischemic MR  Pt with hx of advanced colon cancer without follow-up for several years.  Left foot sepsis 2014 s/p L BKA. Cardiac work up ongoing for possible CABG.  Pt out of room with multiple tests scheduled today.  Pt ate well this am but will need adequate nutrition for future surgery.   Height: Ht Readings from Last 1 Encounters:  07/16/2014 5\' 6"  (1.676 m)    Weight: Wt Readings from Last 1 Encounters:  07/11/14 149 lb 14.6 oz (68 kg)    Ideal Body Weight: 60.2 kg   % Ideal Body Weight: 113%  Wt Readings from Last 10 Encounters:  07/11/14 149 lb 14.6 oz (68 kg)  09/12/13 157 lb (71.215 kg)  03/22/13 152 lb (68.947 kg)  10/11/12 137 lb 12.6 oz (62.5 kg)  09/14/12 142 lb (64.411 kg)  08/03/12 142 lb (64.411 kg)  07/28/12 145 lb (65.772 kg)  07/11/12 149 lb 4 oz (67.7 kg)  03/17/09 151 lb (68.493 kg)    Usual Body Weight: unknown  % Usual Body Weight: -  BMI:  25.8 (adjusted for L BKA) - overweight  Estimated Nutritional Needs: Kcal: 1600-1800 Protein: 75-90 grams Fluid: >1.6 L/day  Skin: left BKA  Diet Order: Diet heart healthy/carb modified  EDUCATION NEEDS: -No education needs  identified at this time   Intake/Output Summary (Last 24 hours) at 07/11/14 1051 Last data filed at 07/11/14 0748  Gross per 24 hour  Intake 1452.87 ml  Output    400 ml  Net 1052.87 ml    Last BM: 1/12   Labs:   Recent Labs Lab 07/28/2014 1543 06/29/2014 2030 07/11/14 0258  NA  --  138 142  K  --  3.9 4.2  CL  --  102 99  CO2  --  27 27  BUN  --  32* 31*  CREATININE 1.87* 1.77* 1.81*  CALCIUM  --  8.7 9.0  MG  --  2.1  --   GLUCOSE  --  222* 181*    CBG (last 3)   Recent Labs  07/24/2014 1811 07/19/2014 2138 07/11/14 0618  GLUCAP 200* 199* 82    Scheduled Meds: . aspirin EC  81 mg Oral Daily  . chlorpheniramine-HYDROcodone  5 mL Oral Once  . enoxaparin (LOVENOX) injection  30 mg Subcutaneous Q24H  . insulin aspart  0-9 Units Subcutaneous TID WC  . isosorbide mononitrate  30 mg Oral Daily    Continuous Infusions: . sodium chloride 1 mL/kg/hr (07/03/2014 1531)    Past Medical History  Diagnosis Date  . History of colon cancer     S/P surgery/chemotherapy  . Peripheral sensory neuropathy due to type 2 diabetes mellitus   . CVA (cerebral infarction) 06/2012    [name] 07/08/2014  .  Kidney stones   . S/P unilateral BKA (below knee amputation) left 07/28/2012  . Stage 3 chronic kidney disease due to type 2 diabetes mellitus 07/08/2014  . Ischemic cardiomyopathy   . Cervical spinal stenosis   . Coronary artery disease   . Colon cancer   . Heart murmur   . Peripheral vascular disease     involving both arms/notes 07/08/2014  . Atherosclerosis of arteries of extremities 07/08/2014  . Type 2 diabetes mellitus with vascular disease 03/14/2009  . Pleural effusion 06/2012    [name] 07/08/2014  . CHF (congestive heart failure)     systolic/notes 10/07/8784  . Myocardial infarction     evidence of a previous old anterior MI on his EKG /notes 07/08/2014    Past Surgical History  Procedure Laterality Date  . 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 Ileostomy  closure  10/31/2009  . Portacath placement  08/08/2008  . Amputation  06/30/2012    Procedure: AMPUTATION BELOW KNEE;  Surgeon: Elam Dutch, MD;  Location: [facility];  Service: Vascular;  Laterality: Left;  . Intraoperative arteriogram  06/30/2012    Procedure: INTRA OPERATIVE ARTERIOGRAM;  Surgeon: Elam Dutch, MD;  Location: [facility];  Service: Vascular;  Laterality: Right;  . Aortogram  06/30/2012    Procedure: AORTOGRAM;  Surgeon: Elam Dutch, MD;  Location: [facility];  Service: Vascular;  Laterality: Right;  Right upper extremity arch   . Tee without cardioversion  07/05/2012    Procedure: TRANSESOPHAGEAL ECHOCARDIOGRAM (TEE);  Surgeon: Thayer Headings, MD;  Location: [facility];  Service: Cardiovascular;  Laterality: N/A;  . Port-a-cath removal    . Incision and drainage of wound N/A 10/11/2012    Procedure: IRRIGATION AND DEBRIDEMENT OF RIGHT FOREARM AND LEFT ELBOW WITH POSSIBLE SURGICAL PREP WITH PLACEMENT OF ACELL AND [place];  Surgeon: Theodoro Kos, DO;  Location: [facility];  Service: Plastics;  Laterality: N/A;  IRRIGATION AND DEBRIDEMENT OF RIGHT FOREARM AND LEFT ELBOW WITH PLACEMENT OF ACELL AND VAC  . Hip pinning,cannulated Left 09/12/2013    Procedure: CANNULATED HIP PINNING;  Surgeon: Johnn Hai, MD;  Location: [facility];  Service: Orthopedics;  Laterality: Left;  . Cardiac catheterization  07/03/2014  . Thoracentesis  06/2012    [name] 07/08/2014  . Left heart catheterization with coronary angiogram N/A 07/16/2014    Procedure: LEFT HEART CATHETERIZATION WITH CORONARY ANGIOGRAM;  Surgeon: Jacolyn Reedy, MD;  Location: [facility] CATH LAB;  Service: Cardiovascular;  Laterality: N/A;    Maylon Peppers RD, [place], [place] Pager [phone] After Hours Pager

## 2014-07-11 NOTE — Progress Notes (Signed)
VASCULAR LAB PRELIMINARY  PRELIMINARY  PRELIMINARY  PRELIMINARY  Right Lower Extremity Vein Map    Right Great Saphenous Vein   Segment Diameter Comment  1. Origin 6.38mm   2. High Thigh 3.25mm Branch 3.52mm  3. Mid Thigh 3.76mm Branch 4.76mm  4. Low Thigh 2.89mm Branch 3.30mm  5. At Knee 2.95mm Branch 2.41mm  6. High Calf 2.23mm Branch joins back in  7. Low Calf 2.85mm Branches again 2.51mm  8. Ankle 2.19mm Branch 2.73mm   mm    mm    mm     Right Small Saphenous Vein  Segment Diameter Comment  1. Origin 1.41mm   2. High Calf 1.64mm   3. Low Calf 1.86mm   4. Ankle 2.56mm    mm    mm    mm       Left Lower Extremity Vein Map    Left Great Saphenous Vein   Segment Diameter Comment  1. Origin 3.50mm   2. High Thigh 1.89mm   3. Mid Thigh 2.71mm   4. Low Thigh 3.81mm   5. At Knee mm S/P BKA  6. High Calf mm   7. Low Calf mm   8. Ankle mm    mm    mm    mm      Landry Mellow, RDMS, RVT  07/11/2014, 12:17 PM

## 2014-07-11 NOTE — Progress Notes (Signed)
Pre-op Cardiac Surgery  Carotid Findings:  Bilateral:  1-39% ICA stenosis.  Vertebral artery flow is antegrade.     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

## 2014-07-12 ENCOUNTER — Inpatient Hospital Stay (HOSPITAL_COMMUNITY): Payer: Commercial Managed Care - HMO

## 2014-07-12 DIAGNOSIS — I2510 Atherosclerotic heart disease of native coronary artery without angina pectoris: Secondary | ICD-10-CM | POA: Diagnosis present

## 2014-07-12 DIAGNOSIS — I255 Ischemic cardiomyopathy: Secondary | ICD-10-CM

## 2014-07-12 DIAGNOSIS — E1122 Type 2 diabetes mellitus with diabetic chronic kidney disease: Secondary | ICD-10-CM

## 2014-07-12 DIAGNOSIS — E43 Unspecified severe protein-calorie malnutrition: Secondary | ICD-10-CM | POA: Diagnosis present

## 2014-07-12 DIAGNOSIS — N183 Chronic kidney disease, stage 3 (moderate): Secondary | ICD-10-CM

## 2014-07-12 LAB — BASIC METABOLIC PANEL
ANION GAP: 13 (ref 5–15)
Anion gap: 12 (ref 5–15)
BUN: 32 mg/dL — AB (ref 6–23)
BUN: 39 mg/dL — ABNORMAL HIGH (ref 6–23)
CHLORIDE: 98 meq/L (ref 96–112)
CHLORIDE: 98 meq/L (ref 96–112)
CO2: 25 mmol/L (ref 19–32)
CO2: 23 mmol/L (ref 19–32)
CREATININE: 1.84 mg/dL — AB (ref 0.50–1.35)
Calcium: 8.7 mg/dL (ref 8.4–10.5)
Calcium: 8.4 mg/dL (ref 8.4–10.5)
Creatinine, Ser: 1.93 mg/dL — ABNORMAL HIGH (ref 0.50–1.35)
GFR calc Af Amer: 41 mL/min — ABNORMAL LOW (ref >90)
GFR calc non Af Amer: 36 mL/min — ABNORMAL LOW (ref >90)
GFR, EST AFRICAN AMERICAN: 44 mL/min — AB (ref >90)
GFR, EST NON AFRICAN AMERICAN: 38 mL/min — AB (ref >90)
GLUCOSE: 138 mg/dL — AB (ref 70–99)
Glucose, Bld: 216 mg/dL — ABNORMAL HIGH (ref 70–99)
POTASSIUM: 5 mmol/L (ref 3.5–5.1)
POTASSIUM: 4 mmol/L (ref 3.5–5.1)
SODIUM: 135 mmol/L (ref 135–145)
SODIUM: 134 mmol/L — AB (ref 135–145)

## 2014-07-12 LAB — GLUCOSE, CAPILLARY
Glucose-Capillary: 126 mg/dL — ABNORMAL HIGH (ref 70–99)
Glucose-Capillary: 133 mg/dL — ABNORMAL HIGH (ref 70–99)
Glucose-Capillary: 185 mg/dL — ABNORMAL HIGH (ref 70–99)

## 2014-07-12 LAB — CARBOXYHEMOGLOBIN
CARBOXYHEMOGLOBIN: 1.6 % — AB (ref 0.5–1.5)
Methemoglobin: 1 % (ref 0.0–1.5)
O2 Saturation: 56.9 %
Total hemoglobin: 13.1 g/dL — ABNORMAL LOW (ref 13.5–18.0)

## 2014-07-12 MED ORDER — AMIODARONE HCL IN DEXTROSE 360-4.14 MG/200ML-% IV SOLN
60.0000 mg/h | INTRAVENOUS | Status: AC
  Administered 2014-07-12 – 2014-07-13 (×3): 60 mg/h via INTRAVENOUS
  Filled 2014-07-12 (×2): qty 200

## 2014-07-12 MED ORDER — SODIUM POLYSTYRENE SULFONATE 15 GM/60ML PO SUSP
30.0000 g | Freq: Once | ORAL | Status: DC
  Filled 2014-07-12: qty 120

## 2014-07-12 MED ORDER — SODIUM CHLORIDE 0.9 % IV SOLN
INTRAVENOUS | Status: DC
  Administered 2014-07-12: 15:00:00 via INTRAVENOUS
  Administered 2014-07-15: 10 mL/h via INTRAVENOUS
  Administered 2014-07-15 – 2014-07-22 (×4): via INTRAVENOUS

## 2014-07-12 MED ORDER — TICAGRELOR 90 MG PO TABS
180.0000 mg | ORAL_TABLET | Freq: Once | ORAL | Status: AC
  Administered 2014-07-12: 180 mg via ORAL
  Filled 2014-07-12: qty 2

## 2014-07-12 MED ORDER — AMIODARONE LOAD VIA INFUSION
150.0000 mg | Freq: Once | INTRAVENOUS | Status: AC
  Administered 2014-07-12: 150 mg via INTRAVENOUS
  Filled 2014-07-12: qty 83.34

## 2014-07-12 MED ORDER — TICAGRELOR 90 MG PO TABS
90.0000 mg | ORAL_TABLET | Freq: Two times a day (BID) | ORAL | Status: DC
  Filled 2014-07-12 (×2): qty 1

## 2014-07-12 MED ORDER — ENOXAPARIN SODIUM 40 MG/0.4ML ~~LOC~~ SOLN
40.0000 mg | SUBCUTANEOUS | Status: DC
  Administered 2014-07-13 – 2014-07-14 (×2): 40 mg via SUBCUTANEOUS
  Filled 2014-07-12 (×4): qty 0.4

## 2014-07-12 MED ORDER — SODIUM CHLORIDE 0.9 % IV SOLN
INTRAVENOUS | Status: DC
  Administered 2014-07-12: 15:00:00 via INTRAVENOUS
  Administered 2014-07-15: 10 mL/h via INTRAVENOUS
  Administered 2014-07-16: 23:00:00 via INTRAVENOUS

## 2014-07-12 MED ORDER — MILRINONE IN DEXTROSE 20 MG/100ML IV SOLN
0.2500 ug/kg/min | INTRAVENOUS | Status: DC
  Administered 2014-07-12: 0.25 ug/kg/min via INTRAVENOUS
  Filled 2014-07-12: qty 100

## 2014-07-12 MED ORDER — TICAGRELOR 90 MG PO TABS
90.0000 mg | ORAL_TABLET | Freq: Two times a day (BID) | ORAL | Status: DC
  Administered 2014-07-12 – 2014-07-21 (×19): 90 mg via ORAL
  Filled 2014-07-12 (×21): qty 1

## 2014-07-12 MED ORDER — AMIODARONE HCL IN DEXTROSE 360-4.14 MG/200ML-% IV SOLN
30.0000 mg/h | INTRAVENOUS | Status: DC
  Administered 2014-07-13 – 2014-07-21 (×16): 30 mg/h via INTRAVENOUS
  Filled 2014-07-12 (×41): qty 200

## 2014-07-12 NOTE — Plan of Care (Signed)
Patient continues to have NSVT longest run 14 beats. Will change the loading dose time 1mg /hour from 6 hours to 12 hours to give higher loading dose for now. Will keep off beta blocker due to heart failure. If continues to have NSVT or develop VT, will decrease dose of milrinone. However will continue to keep milrinone for now. Mildly elevated potassium levels. Will give kayxelate to improve potassium levels for now as last levels checked last night and potassium might have increased by now.  Ordered repeat BMP.   Geralynn Ochs T 10:27 PM

## 2014-07-12 NOTE — Progress Notes (Signed)
  Amiodarone Drug - Drug Interaction Consult Note  Recommendations: Monitor for muscle pains on both statin and amio. Monitor potassium on both diurectis and amio. Monitor HR/rhythm on both carvedilol and amio.   Amiodarone is metabolized by the cytochrome P450 system and therefore has the potential to cause many drug interactions. Amiodarone has an average plasma half-life of 50 days (range 20 to 100 days).   There is potential for drug interactions to occur several weeks or months after stopping treatment and the onset of drug interactions may be slow after initiating amiodarone.   [x]  Statins: Increased risk of myopathy. Simvastatin- restrict dose to 20mg  daily. Other statins: counsel patients to report any muscle pain or weakness immediately.  [x]  Beta blockers: increased risk of bradycardia, AV block and myocardial depression. Sotalol - avoid concomitant use.   [x]  Diuretics: increased risk of cardiotoxicity if hypokalemia occurs.    Thank You,  Brain Hilts  07/12/2014 7:23 PM

## 2014-07-12 NOTE — Progress Notes (Signed)
Pt had a 7 beat run of v-tach, at 5:45, then a 5 beat run of v-tach at 5:51.  Pt in no distress and denied SOB, CP, will continue to monitor patient.

## 2014-07-12 NOTE — Progress Notes (Signed)
NUTRITION FOLLOW-UP  Pt meets criteria for SEVERE MALNUTRITION in the context of chronic illness as evidenced by fat and muscle depletion.  INTERVENTION: Magic cup TID with meals, each supplement provides 290 kcal and 9 grams of protein  NUTRITION DIAGNOSIS: Increased nutrient needs related to wound healing as evidenced by estimated needs; ongoing.   Goal: Pt to meet >/= 90% of their estimated nutrition needs; not met.    Monitor:  PO intake, labs, supplement acceptance   ASSESSMENT: 64 year old diabetic was admitted to the hospital today after coronary angiogram to evaluate ischemic cardiomyopathy and class IV heart failure. He is a nonsmoker but has severe peripheral vascular disease. His echocardiogram shows severe LV dysfunction with LV dilatation, EF 2 20-25 percent, moderate ischemic MR  Left foot sepsis 2014 s/p L BKA.  Spoke with pt and sister. Pt lives at home with brother, per sister they go out a lot to eat, otherwise one of them will cook something. Pt does not like ensure. Pt reports a weight loss of 11% in the last couple of months, some of which she thinks is related to fluid. Per sister pt goes through periods of eating well and then not eating. Pt has ate about 70% of the two meals that he has received since admission.   Nutrition Focused Physical Exam:  Subcutaneous Fat:  Orbital Region: WDL Upper Arm Region: severe depletion Thoracic and Lumbar Region: WDL  Muscle:  Temple Region: severe depletion Clavicle Bone Region: mild/moderate depletion Clavicle and Acromion Bone Region: mild/moderate depletion Scapular Bone Region: unable to assess Dorsal Hand: severe depletion Patellar Region: WDL Anterior Thigh Region: WDL Posterior Calf Region: WDL  Edema: not present    Height: Ht Readings from Last 1 Encounters:  07/21/2014 $RemoveB'5\' 6"'jpYBzimH$  (1.676 m)    Weight: Wt Readings from Last 1 Encounters:  07/12/14 149 lb 14.6 oz (68 kg)  Admission weight: 150 lb (68 kg)  1/13   BMI:  25.8 (adjusted for L BKA) - overweight  Estimated Nutritional Needs: Kcal: 1700-1900 Protein: 85-100 grams Fluid: >1.7 L/day  Skin: left BKA  Diet Order: Diet heart healthy/carb modified   Intake/Output Summary (Last 24 hours) at 07/12/14 0925 Last data filed at 07/12/14 0505  Gross per 24 hour  Intake   1604 ml  Output    700 ml  Net    904 ml    Last BM: 1/14   Labs:   Recent Labs Lab 07/26/2014 2030 07/11/14 0258 07/12/14 0435  NA 138 142 135  K 3.9 4.2 5.0  CL 102 99 98  CO2 $Re'27 27 25  'LwM$ BUN 32* 31* 32*  CREATININE 1.77* 1.81* 1.84*  CALCIUM 8.7 9.0 8.7  MG 2.1  --   --   GLUCOSE 222* 181* 138*    CBG (last 3)   Recent Labs  07/11/14 1756 07/11/14 2117 07/12/14 0634  GLUCAP 165* 160* 126*    Scheduled Meds: . aspirin EC  81 mg Oral Daily  . atorvastatin  80 mg Oral q1800  . carvedilol  3.125 mg Oral BID WC  . chlorhexidine  15 mL Mouth/Throat BID  . chlorpheniramine-HYDROcodone  5 mL Oral Once  . enoxaparin (LOVENOX) injection  30 mg Subcutaneous Q24H  . feeding supplement (ENSURE COMPLETE)  237 mL Oral BID BM  . furosemide  40 mg Intravenous BID  . insulin aspart  0-9 Units Subcutaneous TID WC  . isosorbide mononitrate  30 mg Oral Daily    Continuous Infusions: . sodium chloride 10  mL/hr at 07/11/14 [place], [place], [employer] Pager [phone] After Hours Pager

## 2014-07-12 NOTE — CV Procedure (Addendum)
   Results of RHC   RA  =  7 RV  =  34/8 PA   = 33/17 (24)  PCWP = 14 Thermo CO/CI = 2.3/1.4 Fick CO/CI  = 3.1/1.8 SVR 2479  Co-ox  57%  Volume status looks good but he has low output as well as increasing ventricular ectopy.  Will start milrinone and IV amio to optimize for PCI. Hold lasix for now. Stop b-blocker with low output.  Daniel Bensimhon,MD 7:06 PM

## 2014-07-12 NOTE — Progress Notes (Addendum)
Advanced Heart Failure Rounding Note   Subjective:     Denies CP. Breathing better. Several runs NSVT overnight.   Objective:   Weight Range:  Vital Signs:   Temp:  [98 F (36.7 C)-98.7 F (37.1 C)] 98.5 F (36.9 C) (01/15 0735) Pulse Rate:  [88-94] 88 (01/15 0735) Resp:  [18-20] 20 (01/15 0735) BP: (70-109)/(38-64) 102/45 mmHg (01/15 0735) SpO2:  [95 %-97 %] 97 % (01/15 0735) Weight:  [68 kg (149 lb 14.6 oz)] 68 kg (149 lb 14.6 oz) (01/15 0503) Last BM Date: 07/09/14  Weight change: Filed Weights   07/08/2014 1134 07/11/14 0000 07/12/14 0503  Weight: 68.04 kg (150 lb) 68 kg (149 lb 14.6 oz) 68 kg (149 lb 14.6 oz)    Intake/Output:   Intake/Output Summary (Last 24 hours) at 07/12/14 1025 Last data filed at 07/12/14 0946  Gross per 24 hour  Intake   1604 ml  Output    900 ml  Net    704 ml     Physical Exam: General: Chronically ill appearing. No dyspnea. Nods off quickly HEENT: normal x for poor dentition Neck: supple. JVP 7 Carotids 2+ bilat; no bruits. No lymphadenopathy or thryomegaly appreciated. Cor: PMI nondisplaced. Tachy regular. + 2/6 MR Lungs: decreased throughout. clear Abdomen: soft, nontender, nondistended. No hepatosplenomegaly. No bruits or masses. Good bowel sounds. Extremities: no cyanosis, clubbing, rash, edema. + L BKA L groin ok Neuro: alert & oriented x 3, cranial nerves grossly intact. moves all 4 extremities w/o difficulty. Affect pleasant.  Telemetry: SR with several episodes NSVT  Labs: Basic Metabolic Panel:  Recent Labs Lab 07/24/2014 1543 07/02/2014 2030 07/11/14 0258 07/12/14 0435  NA  --  138 142 135  K  --  3.9 4.2 5.0  CL  --  102 99 98  CO2  --  27 27 25   GLUCOSE  --  222* 181* 138*  BUN  --  32* 31* 32*  CREATININE 1.87* 1.77* 1.81* 1.84*  CALCIUM  --  8.7 9.0 8.7  MG  --  2.1  --   --     Liver Function Tests:  Recent Labs Lab 07/11/14 0258  AST 19  ALT 13  ALKPHOS 98  BILITOT 0.9  PROT 6.5  ALBUMIN 3.2*    No results for input(s): LIPASE, AMYLASE in the last 168 hours. No results for input(s): AMMONIA in the last 168 hours.  CBC:  Recent Labs Lab 06/30/2014 1213 06/29/2014 1543  WBC 10.8* 9.7  HGB 15.5 15.0  HCT 47.0 46.1  MCV 85.1 87.1  PLT 168 172    Cardiac Enzymes: No results for input(s): CKTOTAL, CKMB, CKMBINDEX, TROPONINI in the last 168 hours.  BNP: BNP (last 3 results) No results for input(s): PROBNP in the last 8760 hours.   Other results:    Imaging: Dg Orthopantogram  07/11/2014   CLINICAL DATA:  64 year old male with frontal mouth pain.  EXAM: ORTHOPANTOGRAM/PANORAMIC  COMPARISON:  None.  FINDINGS: Evaluation limited by motion artifact. There are numerous missing and carious teeth. The most notably carious teeth are the right maxillary second premolar and second molar. There appears to be a relatively large periapical lucency about the root of the left submandibular frontal incisor concerning for possible periapical abscess.  IMPRESSION: 1. Large periapical lucency in the frontal mandible surrounding the root of the left central mandibular incisor consistent with periodontal disease and raising the possibility of periapical abscess. 2. Numerous missing and carious teeth.   Electronically Signed  By: Jacqulynn Cadet M.D.   On: 07/11/2014 11:59   Dg Chest 2 View  07/11/2014   CLINICAL DATA:  Initial evaluation cough  EXAM: CHEST  2 VIEW  COMPARISON:  06/26/2014  FINDINGS: Mild cardiac enlargement and vascular congestion with mild interstitial prominence. Limited inspiratory effect. Blunting of both costophrenic angles with small to moderate pleural effusions stable from prior study. Bibasilar airspace opacities appear essentially identical and likely represent atelectasis.  IMPRESSION: Small moderate bilateral pleural effusions unchanged. Suspect mild interstitial pulmonary edema.   Electronically Signed   By: Skipper Cliche M.D.   On: 07/11/2014 11:16   Ct Chest Wo  Contrast  07/11/2014   CLINICAL DATA:  Increasing short of breath, ischemic cardiomyopathy.  EXAM: CT CHEST WITHOUT CONTRAST  TECHNIQUE: Multidetector CT imaging of the chest was performed following the standard protocol without IV contrast.  COMPARISON:  Chest CT 07/08/2012  FINDINGS: Mediastinum/Nodes: No axillary supraclavicular adenopathy.  No pericardial fluid. Esophagus gas-filled without evidence of inflammation. No pericardial fluid coronary calcifications are present.  Lungs/Pleura: Bilateral pleural effusions similar comparison exam. There is mild bibasilar atelectasis which is passive in nature. There is no airspace disease suggests acute pulmonary edema.  Upper abdomen: Limited view of the liver, kidneys, pancreas are unremarkable.  Musculoskeletal: No aggressive osseous lesion.  IMPRESSION: 1. Bilateral pleural effusions which are moderate in volume with associated passive atelectasis. No significant change from comparison exam. 2. No overt pulmonary edema. 3. Coronary calcifications.   Electronically Signed   By: Suzy Bouchard M.D.   On: 07/11/2014 11:39      Medications:     Scheduled Medications: . aspirin EC  81 mg Oral Daily  . atorvastatin  80 mg Oral q1800  . carvedilol  3.125 mg Oral BID WC  . chlorhexidine  15 mL Mouth/Throat BID  . chlorpheniramine-HYDROcodone  5 mL Oral Once  . enoxaparin (LOVENOX) injection  30 mg Subcutaneous Q24H  . furosemide  40 mg Intravenous BID  . insulin aspart  0-9 Units Subcutaneous TID WC  . isosorbide mononitrate  30 mg Oral Daily     Infusions: . sodium chloride 10 mL/hr at 07/11/14 1344     PRN Medications:  acetaminophen, ondansetron (ZOFRAN) IV   Assessment:   1. Acute systolic heart failure due to iCM EF 20% 2. 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 DM2 4. PAD s/p L BKA. Occluded bilateral axillary arteries 5. Acute renal failure 6. Severe protein-calorie malnutrition pre-albumin 13.9 7. NSVT 8.  Hyperkalemia   Plan/Discussion:     Overall stable. No significant angina. Volume status better. Creatinine remains elevated. Long talk with him and his wife again about options of:  1) Medical therapy 2) CABG 3) High-risk unprotected LM/LAD and LCX PCI on Monday with Dr. Burt Knack  Given his severe CAD and comorbidities, I feel strongly that high-risk PCI is his only viable option. He has been turned down for CABG. They agree to proceed. We will move him to ICU to place Swan and optimize hemodynamics for PCI. We discussed the fact that given PAD we will not be able to use Impella support. Will discuss DAPT regimen with Dr. Burt Knack.  Will start heparin after Swan placement. Continues with episodes of NSVT. Will follow K and Mag. (K was 5.0 this am and KCl stopped by Dr. Wynonia Lawman). Mag 2.1 yesterday.    Length of Stay: 2   Glori Bickers MD 07/12/2014, 10:25 AM  Advanced Heart Failure Team Pager [phone] (M-F; 7a -  4p)  Please contact [facility] for night-coverage after hours (4p -7a ) and weekends on [email or web address]

## 2014-07-12 NOTE — Progress Notes (Signed)
Subjective:  Still with some intermittent nausea, not particularly short of breath at the present time.  No ischemic chest pain.  Extensive discussion with Dr. Haroldine Laws yesterday concerning case.  Some nonsustained VT overnight.  Objective:  Vital Signs in the last 24 hours: BP 102/45 mmHg  Pulse 88  Temp(Src) 98.5 F (36.9 C) (Oral)  Resp 20  Ht 5\' 6"  (1.676 m)  Wt 68 kg (149 lb 14.6 oz)  BMI 24.21 kg/m2  SpO2 97%  Physical Exam: WM in NAD Lungs:  Clear  Cardiac:  Regular rhythm, normal S1 and S2, no S3 Extremities:  No edema present  Intake/Output from previous day: 01/14 0701 - 01/15 0700 In: 1804 [P.O.:580; I.V.:1224] Out: 700 [Urine:700] Weight Filed Weights   07/03/2014 1134 07/11/14 0000 07/12/14 0503  Weight: 68.04 kg (150 lb) 68 kg (149 lb 14.6 oz) 68 kg (149 lb 14.6 oz)    Lab Results: Basic Metabolic Panel:  Recent Labs  07/11/14 0258 07/12/14 0435  NA 142 135  K 4.2 5.0  CL 99 98  CO2 27 25  GLUCOSE 181* 138*  BUN 31* 32*  CREATININE 1.81* 1.84*    CBC:  Recent Labs  07/09/2014 1213 07/20/2014 1543  WBC 10.8* 9.7  HGB 15.5 15.0  HCT 47.0 46.1  MCV 85.1 87.1  PLT 168 172    Telemetry: Sinus with PVC's some nonsustained VT.  Occasional PVCs and couplets.   Assessment/Plan:   1.  Severe left ventricular dysfunction with EF of 20%, new since 2014. 2.  Severe left main ostial and three-vessel coronary artery disease. 3.  Stage 3-4 chronic kidney disease. 4.  Malnutrition. 5.  Diabetes. 6.  Severe peripheral vascular disease with bilateral occlusions of his brachial vessels  Recommendations:  At this point, he was felt to be high risk for surgery due to poor distal targets, severe brachial peripheral vascular disease, chronic renal insufficiency, and severe LV dysfunction as well as malnutrition.  Dr. Burt Knack reviewed the films and felt like he could have PCI of the left main, LAD and the circumflex.  The right coronary artery is not really  good for revascularization.  In my opinion.  Plan is for him to be moved to the CCU and for a Swan-Ganz catheter put in to measure hemodynamics and hopefully try to improve renal function prior to intervention next week.  Continue beta blocker for the time being.  Potassium is high today, so we'll discontinue potassium.  I canceled the vein mapping and the viability study because with the severe left main disease and plans for PCI did not feel that these would be particularly helpful and the patient wishes to avoid further testing if possible.   Kerry Hough  MD [facility]  07/12/2014, 8:13 AM

## 2014-07-12 NOTE — Procedures (Signed)
Pulmonary Artery Catheter Insertion Procedure Note Tyler Erickson [id number] December 11, 1950  Procedure: Insertion of Pulmonary Artery Catheter Indications: Assessment of intravascular volume  Procedure Details Consent: Risks of procedure as well as the alternatives and risks of each were explained to the (patient/caregiver).  Consent for procedure obtained. Time Out: Verified patient identification, verified procedure, site/side was marked, verified correct patient position, special equipment/implants available, medications/allergies/relevent history reviewed, required imaging and test results available.  Performed  Maximum sterile technique was used including antiseptics, cap, gloves, gown, hand hygiene, mask and sheet. Skin prep: Chlorhexidine; local anesthetic administered A 7.5 FR venous sheath was placed in the right internal jugular vein using the Seldinger technique. A Swan Ganz catheter was inserted and was navigated into the PA using pressure-waveform guidance. A full RHC was performed and the Gordy Councilman was left in place.  Evaluation Blood flow good Complications: No apparent complications Patient did tolerate procedure well. Chest X-ray ordered to verify placement.  CXR: pending. RHC to be reported separately.   Glori Bickers MD 07/12/2014, 12:07 PM

## 2014-07-13 DIAGNOSIS — N170 Acute kidney failure with tubular necrosis: Secondary | ICD-10-CM

## 2014-07-13 DIAGNOSIS — R570 Cardiogenic shock: Secondary | ICD-10-CM

## 2014-07-13 LAB — BASIC METABOLIC PANEL
Anion gap: 14 (ref 5–15)
BUN: 42 mg/dL — ABNORMAL HIGH (ref 6–23)
CHLORIDE: 98 meq/L (ref 96–112)
CO2: 23 mmol/L (ref 19–32)
Calcium: 8.3 mg/dL — ABNORMAL LOW (ref 8.4–10.5)
Creatinine, Ser: 2.12 mg/dL — ABNORMAL HIGH (ref 0.50–1.35)
GFR calc Af Amer: 37 mL/min — ABNORMAL LOW (ref >90)
GFR, EST NON AFRICAN AMERICAN: 32 mL/min — AB (ref >90)
GLUCOSE: 223 mg/dL — AB (ref 70–99)
POTASSIUM: 3.8 mmol/L (ref 3.5–5.1)
Sodium: 135 mmol/L (ref 135–145)

## 2014-07-13 LAB — GLUCOSE, CAPILLARY
GLUCOSE-CAPILLARY: 193 mg/dL — AB (ref 70–99)
GLUCOSE-CAPILLARY: 215 mg/dL — AB (ref 70–99)
Glucose-Capillary: 215 mg/dL — ABNORMAL HIGH (ref 70–99)
Glucose-Capillary: 207 mg/dL — ABNORMAL HIGH (ref 70–99)
Glucose-Capillary: 160 mg/dL — ABNORMAL HIGH (ref 70–99)

## 2014-07-13 LAB — CARBOXYHEMOGLOBIN
CARBOXYHEMOGLOBIN: 1.2 % (ref 0.5–1.5)
METHEMOGLOBIN: 1 % (ref 0.0–1.5)
O2 Saturation: 60.6 %
Total hemoglobin: 12.9 g/dL — ABNORMAL LOW (ref 13.5–18.0)

## 2014-07-13 MED ORDER — MORPHINE SULFATE 2 MG/ML IJ SOLN
1.0000 mg | INTRAMUSCULAR | Status: DC | PRN
  Administered 2014-07-13 – 2014-07-22 (×3): 2 mg via INTRAVENOUS
  Filled 2014-07-13 (×2): qty 1

## 2014-07-13 MED ORDER — MILRINONE IN DEXTROSE 20 MG/100ML IV SOLN
0.2500 ug/kg/min | INTRAVENOUS | Status: DC
  Administered 2014-07-13 – 2014-07-19 (×11): 0.375 ug/kg/min via INTRAVENOUS
  Administered 2014-07-20 – 2014-07-21 (×2): 0.25 ug/kg/min via INTRAVENOUS
  Filled 2014-07-13 (×14): qty 100

## 2014-07-13 NOTE — Progress Notes (Signed)
3 Days Post-Op Procedure(s) (LRB): LEFT HEART CATHETERIZATION WITH CORONARY ANGIOGRAM (N/A) Subjective: Events noted With low EF, low cardiac output now requiring inotropes, rising creatinine and probable compromise of LIMA flow from PAD I donot rec CABG and agree with plan for PCI assessment  Objective: Vital signs in last 24 hours: Temp:  [97.2 F (36.2 C)-98.7 F (37.1 C)] 97.6 F (36.4 C) (01/16 0753) Pulse Rate:  [25-86] 77 (01/16 0753) Cardiac Rhythm:  [-] Normal sinus rhythm (01/16 0400) Resp:  [13-40] 39 (01/16 0753) BP: (69-135)/(33-102) 105/67 mmHg (01/16 0753) SpO2:  [78 %-100 %] 100 % (01/16 0753) Weight:  [138 lb 3.7 oz (62.7 kg)-141 lb 5 oz (64.1 kg)] 141 lb 5 oz (64.1 kg) (01/16 0500)  Hemodynamic parameters for last 24 hours: PAP: (28-40)/(11-22) 32/11 mmHg CVP:  [2 mmHg-11 mmHg] 2 mmHg PCWP:  [12 mmHg-18 mmHg] 12 mmHg CO:  [1.6 L/min-2.9 L/min] 2.9 L/min CI:  [0.9 L/min/m2-1.7 L/min/m2] 1.7 L/min/m2  Intake/Output from previous day: 01/15 0701 - 01/16 0700 In: 673.9 [I.V.:673.9] Out: 1175 [Urine:1175] Intake/Output this shift:      Lab Results:  Recent Labs  07/02/2014 1213 07/16/2014 1543  WBC 10.8* 9.7  HGB 15.5 15.0  HCT 47.0 46.1  PLT 168 172   BMET:  Recent Labs  07/12/14 2322 07/13/14 0530  NA 134* 135  K 4.0 3.8  CL 98 98  CO2 23 23  GLUCOSE 216* 223*  BUN 39* 42*  CREATININE 1.93* 2.12*  CALCIUM 8.4 8.3*    PT/INR: No results for input(s): LABPROT, INR in the last 72 hours. ABG    Component Value Date/Time   PHART 7.487* 07/02/2012 0414   HCO3 20.8 07/02/2012 0414   TCO2 21.7 07/02/2012 0414   ACIDBASEDEF 2.1* 07/02/2012 0414   O2SAT 60.6 07/13/2014 0530   CBG (last 3)   Recent Labs  07/12/14 1816 07/12/14 2130 07/13/14 0756  GLUCAP 185* 193* 215*    Assessment/Plan: S/P Procedure(s) (LRB): LEFT HEART CATHETERIZATION WITH CORONARY ANGIOGRAM (N/A) Not candidate for CABG   LOS: 3 days    VAN TRIGT  III,Charleton Deyoung 07/13/2014

## 2014-07-13 NOTE — Progress Notes (Signed)
Advanced Heart Failure Rounding Note   Subjective:     C/o dyspnea and PND. Nauseated when trying to eat. Creatinine worse. Continues with frequent ventricular ectopy despite amio. Remains on milrinone 0.25  Swan numbers done personally at bedside with nurse  CVP 7 PA 32/18 (23) PCWP 18 Thermo CO/CI 2.4/1.4 Co-ox 60% SVR (thermo) 2875   Objective:   Weight Range:  Vital Signs:   Temp:  [97.2 F (36.2 C)-98.7 F (37.1 C)] 98.1 F (36.7 C) (01/16 1000) Pulse Rate:  [25-86] 48 (01/16 1000) Resp:  [13-40] 24 (01/16 1000) BP: (69-135)/(33-102) 132/75 mmHg (01/16 1000) SpO2:  [78 %-100 %] 98 % (01/16 1000) Weight:  [62.7 kg (138 lb 3.7 oz)-64.1 kg (141 lb 5 oz)] 64.1 kg (141 lb 5 oz) (01/16 0500) Last BM Date: 07/12/14  Weight change: Filed Weights   07/12/14 0503 07/12/14 1100 07/13/14 0500  Weight: 68 kg (149 lb 14.6 oz) 62.7 kg (138 lb 3.7 oz) 64.1 kg (141 lb 5 oz)    Intake/Output:   Intake/Output Summary (Last 24 hours) at 07/13/14 1040 Last data filed at 07/13/14 0600  Gross per 24 hour  Intake 673.85 ml  Output    975 ml  Net -301.15 ml     Physical Exam: General: Chronically ill appearing. No dyspnea. Nods off quickly HEENT: normal x for poor dentition Neck: supple. JVP 7 Carotids 2+ bilat; no bruits. No lymphadenopathy or thryomegaly appreciated. Cor: PMI nondisplaced. Tachy irregular. + 2/6 MR Lungs: decreased throughout. clear Abdomen: soft, nontender, nondistended. No hepatosplenomegaly. No bruits or masses. Good bowel sounds. Extremities: no cyanosis, clubbing, rash, edema. + L BKA L groin ok Neuro: alert & oriented x 3, cranial nerves grossly intact. moves all 4 extremities w/o difficulty. Affect pleasant.  Telemetry: SR with multiple episodes NSVT. Extensive PVCs, couplets and triplets  Labs: Basic Metabolic Panel:  Recent Labs Lab 07/13/2014 2030 07/11/14 0258 07/12/14 0435 07/12/14 2322 07/13/14 0530  NA 138 142 135 134* 135  K 3.9 4.2  5.0 4.0 3.8  CL 102 99 98 98 98  CO2 27 27 25 23 23   GLUCOSE 222* 181* 138* 216* 223*  BUN 32* 31* 32* 39* 42*  CREATININE 1.77* 1.81* 1.84* 1.93* 2.12*  CALCIUM 8.7 9.0 8.7 8.4 8.3*  MG 2.1  --   --   --   --     Liver Function Tests:  Recent Labs Lab 07/11/14 0258  AST 19  ALT 13  ALKPHOS 98  BILITOT 0.9  PROT 6.5  ALBUMIN 3.2*   No results for input(s): LIPASE, AMYLASE in the last 168 hours. No results for input(s): AMMONIA in the last 168 hours.  CBC:  Recent Labs Lab 07/03/2014 1213 07/16/2014 1543  WBC 10.8* 9.7  HGB 15.5 15.0  HCT 47.0 46.1  MCV 85.1 87.1  PLT 168 172    Cardiac Enzymes: No results for input(s): CKTOTAL, CKMB, CKMBINDEX, TROPONINI in the last 168 hours.  BNP: BNP (last 3 results) No results for input(s): PROBNP in the last 8760 hours.   Other results:    Imaging: Dg Orthopantogram  07/11/2014   CLINICAL DATA:  64 year old male with frontal mouth pain.  EXAM: ORTHOPANTOGRAM/PANORAMIC  COMPARISON:  None.  FINDINGS: Evaluation limited by motion artifact. There are numerous missing and carious teeth. The most notably carious teeth are the right maxillary second premolar and second molar. There appears to be a relatively large periapical lucency about the root of the left submandibular frontal incisor concerning for  possible periapical abscess.  IMPRESSION: 1. Large periapical lucency in the frontal mandible surrounding the root of the left central mandibular incisor consistent with periodontal disease and raising the possibility of periapical abscess. 2. Numerous missing and carious teeth.   Electronically Signed   By: Jacqulynn Cadet M.D.   On: 07/11/2014 11:59   Dg Chest 2 View  07/11/2014   CLINICAL DATA:  Initial evaluation cough  EXAM: CHEST  2 VIEW  COMPARISON:  06/26/2014  FINDINGS: Mild cardiac enlargement and vascular congestion with mild interstitial prominence. Limited inspiratory effect. Blunting of both costophrenic angles with  small to moderate pleural effusions stable from prior study. Bibasilar airspace opacities appear essentially identical and likely represent atelectasis.  IMPRESSION: Small moderate bilateral pleural effusions unchanged. Suspect mild interstitial pulmonary edema.   Electronically Signed   By: Skipper Cliche M.D.   On: 07/11/2014 11:16   Ct Chest Wo Contrast  07/11/2014   CLINICAL DATA:  Increasing short of breath, ischemic cardiomyopathy.  EXAM: CT CHEST WITHOUT CONTRAST  TECHNIQUE: Multidetector CT imaging of the chest was performed following the standard protocol without IV contrast.  COMPARISON:  Chest CT 07/08/2012  FINDINGS: Mediastinum/Nodes: No axillary supraclavicular adenopathy.  No pericardial fluid. Esophagus gas-filled without evidence of inflammation. No pericardial fluid coronary calcifications are present.  Lungs/Pleura: Bilateral pleural effusions similar comparison exam. There is mild bibasilar atelectasis which is passive in nature. There is no airspace disease suggests acute pulmonary edema.  Upper abdomen: Limited view of the liver, kidneys, pancreas are unremarkable.  Musculoskeletal: No aggressive osseous lesion.  IMPRESSION: 1. Bilateral pleural effusions which are moderate in volume with associated passive atelectasis. No significant change from comparison exam. 2. No overt pulmonary edema. 3. Coronary calcifications.   Electronically Signed   By: Suzy Bouchard M.D.   On: 07/11/2014 11:39   Dg Chest Port 1 View  07/12/2014   CLINICAL DATA:  Central line placement  EXAM: PORTABLE CHEST - 1 VIEW  COMPARISON:  Portable exam 1258 hr compared to 07/11/2014  FINDINGS: RIGHT jugular Swan-Ganz catheter tip projecting over RIGHT pulmonary hilum.  Enlargement of cardiac silhouette with pulmonary vascular congestion.  Bibasilar effusions and probable atelectasis, increased.  No pneumothorax.  Bones demineralized.  IMPRESSION: No pneumothorax following Swan-Ganz catheter placement.  Increased  bibasilar effusions and atelectasis.   Electronically Signed   By: Lavonia Dana M.D.   On: 07/12/2014 13:35     Medications:     Scheduled Medications: . aspirin EC  81 mg Oral Daily  . atorvastatin  80 mg Oral q1800  . chlorhexidine  15 mL Mouth/Throat BID  . enoxaparin (LOVENOX) injection  40 mg Subcutaneous Q24H  . insulin aspart  0-9 Units Subcutaneous TID WC  . isosorbide mononitrate  30 mg Oral Daily  . sodium polystyrene  30 g Oral Once  . ticagrelor  90 mg Oral BID    Infusions: . sodium chloride 10 mL/hr at 07/12/14 1515  . sodium chloride 10 mL/hr at 07/12/14 1515  . amiodarone 30 mg/hr (07/13/14 0800)  . milrinone 0.25 mcg/kg/min (07/12/14 2008)    PRN Medications: acetaminophen, ondansetron (ZOFRAN) IV   Assessment:   1. Acute systolic heart failure due to iCM EF 20% -> cardiogenic shock 2. 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 DM2 4. PAD s/p L BKA. Occluded bilateral axillary arteries 5. Acute renal failure 6. Severe protein-calorie malnutrition pre-albumin 13.9 7. NSVT 8. Hyperkalemia   Plan/Discussion:    He has persistent shock physiology despite  milrinone support. Co-ox slightly better but thermodilution numbers are low. Will increase milrinone to 0.375. Watch closley for increasing ventricular ectopy. Will bolus amio again and continue gtt. As likely has ischemic component to VT cana dd lidocaine as needed. Off diuretice as filling pressures ok. Off b-blocker due to shock. No ACE due to shock and renal failure.  Plan is for high risk PCI of LM/LAD/LCX on Monday if renal function and other parameters acceptable. Brilinta has been started.   The patient is critically ill with multiple organ systems failure and requires high complexity decision making for assessment and support, frequent evaluation and titration of therapies, application of advanced monitoring technologies and extensive interpretation of multiple databases.   Critical Care Time  devoted to patient care services described in this note is 35 Minutes.    Length of Stay: 3   Glori Bickers MD 07/13/2014, 10:40 AM  Advanced Heart Failure Team Pager [phone] (M-F; [place])  Please contact [facility] for night-coverage after hours (4p -7a ) and weekends on [email or web address]

## 2014-07-14 ENCOUNTER — Inpatient Hospital Stay (HOSPITAL_COMMUNITY): Payer: Commercial Managed Care - HMO

## 2014-07-14 DIAGNOSIS — R570 Cardiogenic shock: Secondary | ICD-10-CM

## 2014-07-14 DIAGNOSIS — N183 Chronic kidney disease, stage 3 (moderate): Secondary | ICD-10-CM

## 2014-07-14 DIAGNOSIS — I25110 Atherosclerotic heart disease of native coronary artery with unstable angina pectoris: Secondary | ICD-10-CM

## 2014-07-14 DIAGNOSIS — I5021 Acute systolic (congestive) heart failure: Secondary | ICD-10-CM

## 2014-07-14 DIAGNOSIS — N179 Acute kidney failure, unspecified: Secondary | ICD-10-CM

## 2014-07-14 LAB — CARBOXYHEMOGLOBIN
Carboxyhemoglobin: 1.2 % (ref 0.5–1.5)
METHEMOGLOBIN: 1 % (ref 0.0–1.5)
O2 SAT: 72.8 %
TOTAL HEMOGLOBIN: 12.2 g/dL — AB (ref 13.5–18.0)

## 2014-07-14 LAB — BASIC METABOLIC PANEL
Anion gap: 12 (ref 5–15)
BUN: 48 mg/dL — ABNORMAL HIGH (ref 6–23)
CALCIUM: 8.1 mg/dL — AB (ref 8.4–10.5)
CO2: 24 mmol/L (ref 19–32)
Chloride: 97 mEq/L (ref 96–112)
Creatinine, Ser: 2.41 mg/dL — ABNORMAL HIGH (ref 0.50–1.35)
GFR calc non Af Amer: 27 mL/min — ABNORMAL LOW (ref >90)
GFR, EST AFRICAN AMERICAN: 31 mL/min — AB (ref >90)
Glucose, Bld: 160 mg/dL — ABNORMAL HIGH (ref 70–99)
Potassium: 3.2 mmol/L — ABNORMAL LOW (ref 3.5–5.1)
Sodium: 133 mmol/L — ABNORMAL LOW (ref 135–145)

## 2014-07-14 LAB — CEA: CEA: 2.9 ng/mL (ref 0.0–4.7)

## 2014-07-14 LAB — GLUCOSE, CAPILLARY
GLUCOSE-CAPILLARY: 167 mg/dL — AB (ref 70–99)
GLUCOSE-CAPILLARY: 133 mg/dL — AB (ref 70–99)

## 2014-07-14 MED ORDER — POTASSIUM CHLORIDE CRYS ER 20 MEQ PO TBCR
40.0000 meq | EXTENDED_RELEASE_TABLET | Freq: Once | ORAL | Status: AC
  Administered 2014-07-14: 40 meq via ORAL
  Filled 2014-07-14: qty 2

## 2014-07-14 MED ORDER — BIOTENE DRY MOUTH MT LIQD
15.0000 mL | Freq: Two times a day (BID) | OROMUCOSAL | Status: DC
  Administered 2014-07-14 – 2014-07-21 (×14): 15 mL via OROMUCOSAL

## 2014-07-14 NOTE — Consult Note (Signed)
INTERVENTIONAL CARDIOLOGY CONSULT NOTE  Patient ID: Tyler Erickson MRN: [id number] DOB/AGE: [birth year] 64 y.o.  Admit date: 07/26/2014 Referring Physician: Tilley/Bensimhon Primary Physician:  Primary Cardiologist: Dr Wynonia Lawman Reason for Consultation: Severe Multivessel CAD/CHF  HPI: This is a 64 year gentleman in poor general health, who was hospitalized January 11th with acute systolic heart failure. He has a complex medical history with a prolonged hospitalization in 2014 for gangrene and sepsis when he ultimately required a left BKA. He also had right arm skin necrosis at that time and was noted to have very poor arterial circulation to both arms. Other significant comorbid medical conditions include longstanding diabetes with peripheral neuropathy, advanced colon cancer s/p right colectomy and chemotherapy without follow-up, and severe PAD as outlined.  When evaluated by Dr Wynonia Lawman, he was noted to have progressive symptoms of chest pain and DOE on minimal exertion, concerning for acute heart failure and unstable angina. An echocardiogram showed severe LV dysfunction with global hypokinesis and LVEF 25% and moderate MR. Because of severe LV dysfunction, he underwent cardiac catheterization demonstrating severe left main stenosis, severe LAD and LCx stenoses, and total occlusion of the RCA. TCTS was consult and Dr Prescott Gum evaluated the patient. Because of severe vascular disease, continued cardiogenic shock, poor functional status, and severe LV dysfunction, he is not a candidate for CABG. I was asked to review his cath films and evaluate him for high-risk PCI.   The patient has now undergone Swan-Ganz catheter placement and has been started on milrinone to try to optimize his hemodynamics before revascularization. His last hemodynamics from 2200 yesterday show a CVP of 8, PCWP 18, CO 2.9, and CI 1.7. Creatinine has trended up from 1.8 to 2.4 mg/dL this am. Urine output has been 150 cc last  12 hours.  The patient feels ok this am. He denies chest pain or dyspnea at rest. States symptoms have been progressive over one month and now has chest pressure, shortness of breath on minimal activity. No orthopnea or PND.   He lives at home with his brother. He still drives a car and is independent. He is ambulatory with his prosthesis. No recent falls. He sees Dr Alroy Dust, his PCP, regularly but has not had regular follow-up with other physicians.  Past Medical History  Diagnosis Date  . History of colon cancer     S/P surgery/chemotherapy  . Peripheral sensory neuropathy due to type 2 diabetes mellitus   . CVA (cerebral infarction) 06/2012    [name] 07/08/2014  . Kidney stones   . S/P unilateral BKA (below knee amputation) left 07/28/2012  . Stage 3 chronic kidney disease due to type 2 diabetes mellitus 07/08/2014  . Ischemic cardiomyopathy   . Cervical spinal stenosis   . Coronary artery disease   . Colon cancer   . Heart murmur   . Peripheral vascular disease     involving both arms/notes 07/08/2014  . Atherosclerosis of arteries of extremities 07/08/2014  . Type 2 diabetes mellitus with vascular disease 03/14/2009  . Pleural effusion 06/2012    [name] 07/08/2014  . CHF (congestive heart failure)     systolic/notes 1/97/5883  . Myocardial infarction     evidence of a previous old anterior MI on his EKG /notes 07/08/2014     Past Surgical History  Procedure Laterality Date  . 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 Ileostomy closure  10/31/2009  . Portacath placement  08/08/2008  . Amputation  06/30/2012  Procedure: AMPUTATION BELOW KNEE;  Surgeon: Elam Dutch, MD;  Location: [place];  Service: Vascular;  Laterality: Left;  . Intraoperative arteriogram  06/30/2012    Procedure: INTRA OPERATIVE ARTERIOGRAM;  Surgeon: Elam Dutch, MD;  Location: [place];  Service: Vascular;  Laterality: Right;  . Aortogram  06/30/2012    Procedure: AORTOGRAM;  Surgeon: Elam Dutch,  MD;  Location: [facility] OR;  Service: Vascular;  Laterality: Right;  Right upper extremity arch   . Tee without cardioversion  07/05/2012    Procedure: TRANSESOPHAGEAL ECHOCARDIOGRAM (TEE);  Surgeon: Thayer Headings, MD;  Location: [place];  Service: Cardiovascular;  Laterality: N/A;  . Port-a-cath removal    . Incision and drainage of wound N/A 10/11/2012    Procedure: IRRIGATION AND DEBRIDEMENT OF RIGHT FOREARM AND LEFT ELBOW WITH POSSIBLE SURGICAL PREP WITH PLACEMENT OF ACELL AND [place];  Surgeon: Theodoro Kos, DO;  Location: [place];  Service: Plastics;  Laterality: N/A;  IRRIGATION AND DEBRIDEMENT OF RIGHT FOREARM AND LEFT ELBOW WITH PLACEMENT OF ACELL AND VAC  . Hip pinning,cannulated Left 09/12/2013    Procedure: CANNULATED HIP PINNING;  Surgeon: Johnn Hai, MD;  Location: [place];  Service: Orthopedics;  Laterality: Left;  . Cardiac catheterization  07/21/2014  . Thoracentesis  06/2012    [name] 07/08/2014  . Left heart catheterization with coronary angiogram N/A 07/25/2014    Procedure: LEFT HEART CATHETERIZATION WITH CORONARY ANGIOGRAM;  Surgeon: Jacolyn Reedy, MD;  Location: [facility] CATH LAB;  Service: Cardiovascular;  Laterality: N/A;     Family History  Problem Relation Age of Onset  . Diabetes Mother   . Diabetes Father   . Heart failure Mother   . Heart failure Father   . Stroke Father   . Diabetes Sister   . Diabetes Sister   . Diabetes Brother     Social History: History   Social History  . Marital Status: Single    Spouse Name: N/A    Number of Children: 0  . Years of Education: 12   Occupational History  . Disabled since diagnosed with colon cancer   . Glass blower/designer prior to disability    Social History Main Topics  . Smoking status: Former Smoker -- 25 years    Types: Cigars    Start date: 06/29/2012    Quit date: 05/28/2014  . Smokeless tobacco: Never Used  . Alcohol Use: No  . Drug Use: No  . Sexual Activity: Not Currently   Other Topics Concern  . Not  on file   Social History Narrative   Single.  Lives with his brother.  No children.       Prior to Admission medications   Medication Sig Start Date End Date Taking? Authorizing Provider  aspirin 81 MG tablet Take 81 mg by mouth daily.   Yes Historical Provider, MD  furosemide (LASIX) 40 MG tablet Take 1 tablet by mouth 2 (two) times daily. 06/27/14  Yes Historical Provider, MD  isosorbide mononitrate (IMDUR) 30 MG 24 hr tablet Take 1 tablet by mouth daily. 07/01/14  Yes Historical Provider, MD  acetaminophen (TYLENOL) 325 MG tablet Take 2 tablets (650 mg total) by mouth every 6 (six) hours as needed for mild pain (or Fever >/= 101). Patient not taking: Reported on 07/07/2014 09/14/13   Delfina Redwood, MD  bisacodyl (DULCOLAX) 10 MG suppository Place 1 suppository (10 mg total) rectally as needed for moderate constipation. Patient not taking: Reported on 07/03/2014 09/14/13  Delfina Redwood, MD  docusate sodium (COLACE) 100 MG capsule Take 1 capsule (100 mg total) by mouth 2 (two) times daily as needed for mild constipation. Patient not taking: Reported on 07/27/2014 09/12/13   Johnn Hai, MD  enoxaparin (LOVENOX) 40 MG/0.4ML injection Inject 0.4 mLs (40 mg total) into the skin daily. For 1 month Patient not taking: Reported on 07/08/2014 09/14/13   Delfina Redwood, MD  HYDROcodone-acetaminophen (NORCO) 7.5-325 MG per tablet Take 1-2 tablets by mouth every 4 (four) hours as needed for moderate pain. Patient not taking: Reported on 07/25/2014 09/12/13   Johnn Hai, MD  ondansetron (ZOFRAN) 4 MG tablet Take 1 tablet (4 mg total) by mouth every 6 (six) hours as needed for nausea. Patient not taking: Reported on 07/17/2014 09/14/13   Delfina Redwood, MD  senna (SENOKOT) 8.6 MG TABS tablet Take 2 tablets (17.2 mg total) by mouth daily as needed for mild constipation. Patient not taking: Reported on 07/27/2014 09/14/13   Delfina Redwood, MD    HOSPITAL MEDICATIONS: . aspirin EC  81  mg Oral Daily  . atorvastatin  80 mg Oral q1800  . chlorhexidine  15 mL Mouth/Throat BID  . enoxaparin (LOVENOX) injection  40 mg Subcutaneous Q24H  . insulin aspart  0-9 Units Subcutaneous TID WC  . isosorbide mononitrate  30 mg Oral Daily  . sodium polystyrene  30 g Oral Once  . ticagrelor  90 mg Oral BID   . sodium chloride 10 mL/hr at 07/13/14 2000  . sodium chloride 10 mL/hr at 07/13/14 2000  . amiodarone 30 mg/hr (07/13/14 2135)  . milrinone 0.375 mcg/kg/min (07/14/14 0318)    ROS: As per HPI. Positive for loose stools but otherwise negative.  Physical Exam: Blood pressure 103/41, pulse 75, temperature 98.1 F (36.7 C), temperature source Core (Comment), resp. rate 17, height 5\' 6"  (1.676 m), weight 145 lb 1 oz (65.8 kg), SpO2 94 %.  Pt is alert and oriented, chronically ill-appearing, in no distress. HEENT: normal Neck: JVP moderately elevated. Carotid upstrokes normal without bruits. No thyromegaly. Lungs: equal expansion, clear bilaterally CV: Apex is discrete and nondisplaced, RRR with 2/6 systolic murmur at the LSB Abd: soft, NT, +BS, no bruit, no hepatosplenomegaly Back: no CVA tenderness Ext: no C/C/E        Femoral pulses 2+=         Left BKA, distal right pulses nonpalpable        Right arm deformity noted Skin: warm and dry without rash Neuro: CNII-XII intact              Labs:   Lab Results  Component Value Date   WBC 9.7 07/11/2014   HGB 15.0 07/02/2014   HCT 46.1 07/02/2014   MCV 87.1 07/08/2014   PLT 172 07/04/2014    Recent Labs Lab 07/11/14 0258  07/14/14 0448  NA 142  < > 133*  K 4.2  < > 3.2*  CL 99  < > 97  CO2 27  < > 24  BUN 31*  < > 48*  CREATININE 1.81*  < > 2.41*  CALCIUM 9.0  < > 8.1*  PROT 6.5  --   --   BILITOT 0.9  --   --   ALKPHOS 98  --   --   ALT 13  --   --   AST 19  --   --   GLUCOSE 181*  < > 160*  < > = values  in this interval not displayed. Lab Results  Component Value Date   TROPONINI <0.30 06/30/2012      Lab Results  Component Value Date   CHOL 76 07/06/2012   Lab Results  Component Value Date   HDL 10* 07/06/2012   Lab Results  Component Value Date   LDLCALC 27 07/06/2012   Lab Results  Component Value Date   TRIG 194* 07/06/2012   Lab Results  Component Value Date   CHOLHDL 7.6 07/06/2012   No results found for: LDLDIRECT    Radiology: Dg Chest Port 1 View  07/12/2014   CLINICAL DATA:  Central line placement  EXAM: PORTABLE CHEST - 1 VIEW  COMPARISON:  Portable exam 1258 hr compared to 07/11/2014  FINDINGS: RIGHT jugular Swan-Ganz catheter tip projecting over RIGHT pulmonary hilum.  Enlargement of cardiac silhouette with pulmonary vascular congestion.  Bibasilar effusions and probable atelectasis, increased.  No pneumothorax.  Bones demineralized.  IMPRESSION: No pneumothorax following Swan-Ganz catheter placement.  Increased bibasilar effusions and atelectasis.   Electronically Signed   By: Lavonia Dana M.D.   On: 07/12/2014 13:35    EKG: Sinus rhythm with frequent PVC's, couplets.  2D Echo: Study Conclusions  - Left ventricle: The cavity size was mildly dilated. Wall thickness was normal. The estimated ejection fraction was 25%. Diffuse hypokinesis. - Mitral valve: There was mild to moderate regurgitation. - Left atrium: The atrium was mildly dilated. - Right ventricle: The cavity size was normal. Systolic function was normal. - Right atrium: The atrium was mildly dilated. - Pulmonary arteries: PA peak pressure: 37 mm Hg (S).  CARDIAC CATH HEMODYNAMICS: Aorta is 115/80, left ventricular pressures not done  ANGIOGRAPHIC DATA:   CORONARY ARTERIES: Arise and distribute normally. Right dominant. Moderate coronary calcification is noted.  Left main coronary artery: Severe ostial left main disease  Left anterior descending: Severe 90% proximal stenosis, severe diagonal stenosis which is segmental  Circumflex coronary artery: 70-80% circumflex  stenosis.  Right coronary artery: Occluded in its midportion, severely diseased proximally, collateral filling is seen from the left coronary artery.  LEFT VENTRICULOGRAM: Not done due to severe left main disease and adequate echo  IMPRESSIONS:  1. Severe left main and three-vessel coronary artery disease 2. Severely depressed ventricular function  RECOMMENDATION: We'll keep in the hospital for observation, surgical consult, although is high risk for surgery also.  Tyler Hough MD [facility]  ASSESSMENT AND PLAN:  1. Severe left main and multivessel CAD 2. Acute on chronic systolic heart failure with cardiogenic shock 3. Severe PAD s/p left BKA and known severe upper extremity occlusive disease 4. Severe ischemic cardiomyopathy 5. Uncontrolled diabetes with renal, peripheral arterial, and neuropathic complications 6. Acute kidney injury on background of CKD (diabetes, contrast, shock) with rising creatinine  Lengthy discussion with patient about severity of his current illness and treatment options. I have discussed his case with Dr Prescott Gum, Dr Haroldine Laws, and Dr Wynonia Lawman. We have reviewed potential treatment and compared pros and cons of palliative medical therapy, high-risk PCI, and high-risk CABG. He is not felt to be a candidate for CABG. At age 30, he does not feel ready for a palliative approach to his care and we discussed this specifically today. Therefore, I think high-risk PCI is his best treatment option. Unfortunately, he is currently in refractory cardiogenic shock on milrinone with rising creatinine. Ventricular ectopy is improved on IV amiodarone and he has no ischemic symptoms at rest. Whether he can be stabilized for PCI is  another question. In my opinion, we cannot take him for PCI in his current state as his risk of worsening renal injury/hemodialysis and even procedural death would be prohibitive. If his renal function stabilizes and shock improves, will plan on  proceeding with PCI.   I have reviewed the risks, indications, and alternatives to cardiac catheterization and PCI with the patient. Risks include but are not limited to bleeding, infection, vascular injury, stroke, myocardial infection, arrhythmia, kidney injury, radiation-related injury in the case of prolonged fluoroscopy use, emergency cardiac surgery, and death. I have estimated the risk of major complication, including death, is approximately 5-10% with unprotected left main/LAD/possible LCx PCI. The patient understands these risks and agrees to proceed.  Will order an aorto-iliac duplex scan to evaluate iliac vessel size and presence of proximal occlusive disease. If vessels will tolerate, may consider hemodynamic support either before or as an adjunct to PCI to help treat this patient's shock. Otherwise plan to continue current therapies, follow renal function daily, and continue other supportive measures. He is tolerating DAPT with ASA and brilinta at the current time.  Sherren Mocha 07/14/2014, 8:31 AM

## 2014-07-14 NOTE — Evaluation (Signed)
Physical Therapy Evaluation Patient Details Name: Tyler Erickson MRN: [id number] DOB: Oct 22, 1950 Today's Date: 07/14/2014   History of Present Illness  64 y.o. male admitted to [facility] on 07/19/2014 with dyspnea, chest pain, and abnormal EKG. Cardiac cath revealed cardiac catheterization demonstrating severe left main stenosis, severe LAD and LCx stenoses, and total occlusion of the RCA.  He is being evaluated by cardiology for high risk PCI placement.  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   Clinical Impression  Pt is self limiting in his mobility.  I am not sure, but he seems very depressed.  He was agreeable with much pushing to sit EOB, but refused OOB mobility today.  He got EOB with very minimal assist.  PTA he was ambulating independently without an assistive device.   PT to follow acutely for deficits listed below.       Follow Up Recommendations No PT follow up;Supervision for mobility/OOB    Equipment Recommendations  None recommended by PT    Recommendations for Other Services   NA    Precautions / Restrictions Precautions Precautions: Fall Precaution Comments: due to h/o L AKA      Mobility  Bed Mobility Overal bed mobility: Needs Assistance Bed Mobility: Supine to Sit;Sit to Supine     Supine to sit: Supervision;Min assist Sit to supine: Supervision   General bed mobility comments: Pt needed min assist only to boost trunk up to get to sitting from sidelying on EOB.  Pt using bedrail with left hand for leverage. Supervision for safety and line management to get back into bed from sitting.   Transfers                 General transfer comment: Pt declined OOB to chair at this time.    Ambulation/Gait             General Gait Details: Pt does have prosthesis with  him, so when more medically stable we can ambulate.           Balance Overall balance assessment: Needs assistance Sitting-balance support: Feet supported;Single extremity supported;No upper extremity supported Sitting balance-Leahy Scale: Fair Sitting balance - Comments: Pt did have a posterior LOB while attempting to scoot up to [facility] with PT, but static sitting WNL.  Postural control: Posterior lean     Standing balance comment: NT due to pt refusal.                              Pertinent Vitals/Pain Pain Assessment: No/denies pain    Home Living Family/patient expects to be discharged to:: Private residence Living Arrangements: Other relatives (brother) Available Help at Discharge: Family;Available PRN/intermittently Type of Home: Apartment Home Access: Stairs to enter Entrance Stairs-Rails: None Entrance Stairs-Number of Steps: 1 Home Layout: One level Home Equipment: Walker - 2 wheels;Cane - single point;Shower seat;Wheelchair - manual (pt's walker has a right platform)      Prior Function Level of Independence: Independent         Comments: per pt report he uses his left leg prosthesis, but no assistive device for gait.  He drives and cares for himself.      Hand Dominance   Dominant Hand: Left (due to CVA and right arm contractures.)  Extremity/Trunk Assessment   Upper Extremity Assessment: RUE deficits/detail RUE Deficits / Details: right arm with elbow, wrist, hand flexion contractures due to h/o staph and stroke that affected this side.  Shoulder joint seems stable.          Lower Extremity Assessment: Generalized weakness      Cervical / Trunk Assessment: Other exceptions  Communication   Communication: No difficulties  Cognition Arousal/Alertness: Awake/alert Behavior During Therapy: WFL for tasks assessed/performed Overall Cognitive Status: Within Functional Limits for tasks assessed                                Assessment/Plan    PT Assessment Patient needs continued PT services  PT Diagnosis Difficulty walking;Abnormality of gait;Generalized weakness   PT Problem List Decreased strength;Decreased activity tolerance;Decreased balance;Decreased mobility;Cardiopulmonary status limiting activity  PT Treatment Interventions DME instruction;Gait training;Functional mobility training;Therapeutic activities;Therapeutic exercise;Balance training;Cognitive remediation;Patient/family education;Stair training;Wheelchair mobility training   PT Goals (Current goals can be found in the Care Plan section) Acute Rehab PT Goals Patient Stated Goal: to go home PT Goal Formulation: With patient Time For Goal Achievement: 07/28/14 Potential to Achieve Goals: Good    Frequency Min 3X/week    End of Session Equipment Utilized During Treatment: Oxygen Activity Tolerance: Other (comment) (pt self limiting) Patient left: in bed;with call bell/phone within reach Nurse Communication: Mobility status         Time: 1914-7829 PT Time Calculation (min) (ACUTE ONLY): 33 min   Charges:   PT Evaluation $Initial PT Evaluation Tier I: 1 Procedure PT Treatments $Therapeutic Activity: 8-22 mins        Konner Warrior B. Navarino, [place], DPT [phone]   07/14/2014, 5:06 PM

## 2014-07-14 NOTE — Progress Notes (Signed)
Advanced Heart Failure Rounding Note   Subjective:     Feels better this am. Less dyspnea/PND. No CP. Still nauseated when trying to eat. Creatinine worse.   VT much improved with amio load. Co-ox and swan numbers improving with increased milrinone. Seen by Dr. Burt Knack this am.   CVP 10 PA 36/27 (232) PCWP 18 Thermo CO/CI 4.0/2.3 Co-ox 73% SVR (thermo) 1518   Objective:   Weight Range:  Vital Signs:   Temp:  [97 F (36.1 C)-98.1 F (36.7 C)] 98.1 F (36.7 C) (01/17 1038) Pulse Rate:  [28-126] 60 (01/17 1038) Resp:  [0-38] 22 (01/17 1038) BP: (88-152)/(30-98) 137/62 mmHg (01/17 0900) SpO2:  [92 %-98 %] 98 % (01/17 1038) Weight:  [65.8 kg (145 lb 1 oz)] 65.8 kg (145 lb 1 oz) (01/17 0445) Last BM Date: 07/13/14  Weight change: Filed Weights   07/12/14 1100 07/13/14 0500 07/14/14 0445  Weight: 62.7 kg (138 lb 3.7 oz) 64.1 kg (141 lb 5 oz) 65.8 kg (145 lb 1 oz)    Intake/Output:   Intake/Output Summary (Last 24 hours) at 07/14/14 1047 Last data filed at 07/14/14 1005  Gross per 24 hour  Intake 1052.82 ml  Output    901 ml  Net 151.82 ml     Physical Exam: General: Chronically ill appearing. No dyspnea.  HEENT: normal x for poor dentition Neck: supple. JVP jaw Carotids 2+ bilat; no bruits. No lymphadenopathy or thryomegaly appreciated. Cor: PMI nondisplaced. irregular. + 2/6 MR Lungs: decreased throughout. clear Abdomen: soft, nontender, nondistended. No hepatosplenomegaly. No bruits or masses. Good bowel sounds. Extremities: no cyanosis, clubbing, rash, edema. + L BKA L groin ok Neuro: alert & oriented x 3, cranial nerves grossly intact. moves all 4 extremities w/o difficulty. Affect pleasant.  Telemetry: SR with multiple episodes NSVT. Extensive PVCs, couplets and triplets  Labs: Basic Metabolic Panel:  Recent Labs Lab 07/08/2014 2030 07/11/14 0258 07/12/14 0435 07/12/14 2322 07/13/14 0530 07/14/14 0448  NA 138 142 135 134* 135 133*  K 3.9 4.2 5.0  4.0 3.8 3.2*  CL 102 99 98 98 98 97  CO2 27 27 25 23 23 24   GLUCOSE 222* 181* 138* 216* 223* 160*  BUN 32* 31* 32* 39* 42* 48*  CREATININE 1.77* 1.81* 1.84* 1.93* 2.12* 2.41*  CALCIUM 8.7 9.0 8.7 8.4 8.3* 8.1*  MG 2.1  --   --   --   --   --     Liver Function Tests:  Recent Labs Lab 07/11/14 0258  AST 19  ALT 13  ALKPHOS 98  BILITOT 0.9  PROT 6.5  ALBUMIN 3.2*   No results for input(s): LIPASE, AMYLASE in the last 168 hours. No results for input(s): AMMONIA in the last 168 hours.  CBC:  Recent Labs Lab 07/25/2014 1213 07/01/2014 1543  WBC 10.8* 9.7  HGB 15.5 15.0  HCT 47.0 46.1  MCV 85.1 87.1  PLT 168 172    Cardiac Enzymes: No results for input(s): CKTOTAL, CKMB, CKMBINDEX, TROPONINI in the last 168 hours.  BNP: BNP (last 3 results) No results for input(s): PROBNP in the last 8760 hours.   Other results:    Imaging: Dg Chest Port 1 View  07/12/2014   CLINICAL DATA:  Central line placement  EXAM: PORTABLE CHEST - 1 VIEW  COMPARISON:  Portable exam 1258 hr compared to 07/11/2014  FINDINGS: RIGHT jugular Swan-Ganz catheter tip projecting over RIGHT pulmonary hilum.  Enlargement of cardiac silhouette with pulmonary vascular congestion.  Bibasilar effusions and  probable atelectasis, increased.  No pneumothorax.  Bones demineralized.  IMPRESSION: No pneumothorax following Swan-Ganz catheter placement.  Increased bibasilar effusions and atelectasis.   Electronically Signed   By: Lavonia Dana M.D.   On: 07/12/2014 13:35     Medications:     Scheduled Medications: . aspirin EC  81 mg Oral Daily  . atorvastatin  80 mg Oral q1800  . chlorhexidine  15 mL Mouth/Throat BID  . enoxaparin (LOVENOX) injection  40 mg Subcutaneous Q24H  . insulin aspart  0-9 Units Subcutaneous TID WC  . isosorbide mononitrate  30 mg Oral Daily  . ticagrelor  90 mg Oral BID    Infusions: . sodium chloride 10 mL/hr at 07/14/14 0800  . sodium chloride 10 mL/hr at 07/14/14 0800  .  amiodarone 30 mg/hr (07/14/14 1005)  . milrinone 0.375 mcg/kg/min (07/14/14 0800)    PRN Medications: acetaminophen, morphine injection, ondansetron (ZOFRAN) IV   Assessment:   1. Acute systolic heart failure due to iCM EF 20% -> cardiogenic shock 2. 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 DM2 4. PAD s/p L BKA. Occluded bilateral axillary arteries 5. Acute renal failure 6. Severe protein-calorie malnutrition pre-albumin 13.9 7. NSVT 8. Hyperkalemia 9. Colon CA s/p colectomy 2009 and adjuvant chemo  Plan/Discussion:    Hemodynamics and ventricular ectopy much improved on current regimen. Creatinine slightly worse likely due to previous shock and possibly component of CIN. I expect this will improve soon with improvement in hemodynamics. Will hold diuretics one more day. No b-blocker or ACE now due to shock/AKI.   Peri-prandial nausea persists despite restoration of CO. 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 Will check KUB to exclude obstruction in setting of known colon CA. May be reasonable to consider non-contrast CT of A/P at some point.   Appreciate Dr. Antionette Char input. Agree that we need to defer PCI at this time. Will shoot for early next week when. Continue hemodynamic support. Will have PT see to help with deconditioning.   Continue ASA/statin/Brilinta.   The patient is critically ill with multiple organ systems failure and requires high complexity decision making for assessment and support, frequent evaluation and titration of therapies, application of advanced monitoring technologies and extensive interpretation of multiple databases.   Critical Care Time devoted to patient care services described in this note is 45 Minutes.    Length of Stay: 4   Glori Bickers MD 07/14/2014, 10:47 AM  Advanced Heart Failure Team Pager [phone] (M-F; [place])  Please contact [facility] for night-coverage after hours (4p -7a ) and weekends on [email or web address]

## 2014-07-15 ENCOUNTER — Encounter (HOSPITAL_COMMUNITY): Admission: RE | Disposition: E | Payer: Medicare HMO | Source: Ambulatory Visit | Attending: Cardiology

## 2014-07-15 LAB — CARBOXYHEMOGLOBIN
Carboxyhemoglobin: 1 % (ref 0.5–1.5)
METHEMOGLOBIN: 0.9 % (ref 0.0–1.5)
O2 SAT: 67.8 %
TOTAL HEMOGLOBIN: 12.4 g/dL — AB (ref 13.5–18.0)

## 2014-07-15 LAB — BASIC METABOLIC PANEL
Anion gap: 10 (ref 5–15)
BUN: 47 mg/dL — ABNORMAL HIGH (ref 6–23)
CO2: 23 mmol/L (ref 19–32)
CREATININE: 2.46 mg/dL — AB (ref 0.50–1.35)
Calcium: 8 mg/dL — ABNORMAL LOW (ref 8.4–10.5)
Chloride: 98 mEq/L (ref 96–112)
GFR calc Af Amer: 31 mL/min — ABNORMAL LOW (ref >90)
GFR calc non Af Amer: 26 mL/min — ABNORMAL LOW (ref >90)
Glucose, Bld: 132 mg/dL — ABNORMAL HIGH (ref 70–99)
POTASSIUM: 3.8 mmol/L (ref 3.5–5.1)
SODIUM: 131 mmol/L — AB (ref 135–145)

## 2014-07-15 LAB — GLUCOSE, CAPILLARY
GLUCOSE-CAPILLARY: 172 mg/dL — AB (ref 70–99)
Glucose-Capillary: 135 mg/dL — ABNORMAL HIGH (ref 70–99)
Glucose-Capillary: 126 mg/dL — ABNORMAL HIGH (ref 70–99)
Glucose-Capillary: 161 mg/dL — ABNORMAL HIGH (ref 70–99)

## 2014-07-15 LAB — CLOSTRIDIUM DIFFICILE BY PCR: CDIFFPCR: NEGATIVE

## 2014-07-15 LAB — MAGNESIUM: Magnesium: 2 mg/dL (ref 1.5–2.5)

## 2014-07-15 SURGERY — PERCUTANEOUS CORONARY STENT INTERVENTION (PCI-S)
Anesthesia: LOCAL

## 2014-07-15 MED ORDER — ENOXAPARIN SODIUM 30 MG/0.3ML ~~LOC~~ SOLN
30.0000 mg | SUBCUTANEOUS | Status: DC
Start: 2014-07-15 — End: 2014-07-16
  Administered 2014-07-15: 30 mg via SUBCUTANEOUS
  Filled 2014-07-15 (×3): qty 0.3

## 2014-07-15 MED ORDER — METOCLOPRAMIDE HCL 5 MG PO TABS
5.0000 mg | ORAL_TABLET | Freq: Three times a day (TID) | ORAL | Status: DC
  Administered 2014-07-15 – 2014-07-19 (×13): 5 mg via ORAL
  Filled 2014-07-15 (×16): qty 1

## 2014-07-15 MED ORDER — LINAGLIPTIN 5 MG PO TABS
5.0000 mg | ORAL_TABLET | Freq: Every day | ORAL | Status: DC
  Administered 2014-07-15 – 2014-07-21 (×7): 5 mg via ORAL
  Filled 2014-07-15 (×9): qty 1

## 2014-07-15 MED ORDER — FUROSEMIDE 10 MG/ML IJ SOLN
40.0000 mg | Freq: Once | INTRAMUSCULAR | Status: AC
  Administered 2014-07-15: 40 mg via INTRAVENOUS
  Filled 2014-07-15: qty 4

## 2014-07-15 NOTE — Progress Notes (Signed)
Advanced Heart Failure Rounding Note   Subjective:     Feels better this am. Denies SOB. No CP. Nausea slightly improved.  KUB suggestive of ileus.   VT much improved with amio load.   CVP 13  PA 41/25 (31)  PCWP 21 Thermo CO/CI 4.2/2.5 Co-ox 67% SVR (thermo) 1011  Objective:   Weight Range:  Vital Signs:   Temp:  [97.3 F (36.3 C)-98.4 F (36.9 C)] 98.1 F (36.7 C) (01/18 0500) Pulse Rate:  [39-120] 53 (01/18 0500) Resp:  [18-29] 23 (01/18 0500) BP: (87-156)/(23-111) 114/42 mmHg (01/18 0500) SpO2:  [92 %-98 %] 95 % (01/18 0500) Weight:  [145 lb 11.6 oz (66.1 kg)] 145 lb 11.6 oz (66.1 kg) (01/18 0454) Last BM Date: 07/14/14  Weight change: Filed Weights   07/13/14 0500 07/14/14 0445 07/19/2014 0454  Weight: 141 lb 5 oz (64.1 kg) 145 lb 1 oz (65.8 kg) 145 lb 11.6 oz (66.1 kg)    Intake/Output:   Intake/Output Summary (Last 24 hours) at 07/24/2014 0710 Last data filed at 07/24/2014 0500  Gross per 24 hour  Intake 1836.31 ml  Output    776 ml  Net 1060.31 ml     Physical Exam: General: Chronically ill appearing. No dyspnea.  HEENT: normal x for poor dentition Neck: supple. RIJ swan  Carotids 2+ bilat; no bruits. No lymphadenopathy or thryomegaly appreciated. Cor: PMI nondisplaced. regular. + 2/6 MR Lungs: decreased throughout. clear Abdomen: soft, nontender, nondistended. No hepatosplenomegaly. No bruits or masses. Good bowel sounds. Extremities: no cyanosis, clubbing, rash, edema. + L BKA L groin ok Neuro: alert & oriented x 3, cranial nerves grossly intact. moves all 4 extremities w/o difficulty. Affect pleasant.  Telemetry: SR with PVCs  Labs: Basic Metabolic Panel:  Recent Labs Lab 07/17/2014 2030  07/12/14 0435 07/12/14 2322 07/13/14 0530 07/14/14 0448 06/28/2014 0234  NA 138  < > 135 134* 135 133* 131*  K 3.9  < > 5.0 4.0 3.8 3.2* 3.8  CL 102  < > 98 98 98 97 98  CO2 27  < > 25 23 23 24 23   GLUCOSE 222*  < > 138* 216* 223* 160* 132*  BUN 32*  < >  32* 39* 42* 48* 47*  CREATININE 1.77*  < > 1.84* 1.93* 2.12* 2.41* 2.46*  CALCIUM 8.7  < > 8.7 8.4 8.3* 8.1* 8.0*  MG 2.1  --   --   --   --   --  2.0  < > = values in this interval not displayed.  Liver Function Tests:  Recent Labs Lab 07/11/14 0258  AST 19  ALT 13  ALKPHOS 98  BILITOT 0.9  PROT 6.5  ALBUMIN 3.2*   No results for input(s): LIPASE, AMYLASE in the last 168 hours. No results for input(s): AMMONIA in the last 168 hours.  CBC:  Recent Labs Lab 07/20/2014 1213 07/04/2014 1543  WBC 10.8* 9.7  HGB 15.5 15.0  HCT 47.0 46.1  MCV 85.1 87.1  PLT 168 172    Cardiac Enzymes: No results for input(s): CKTOTAL, CKMB, CKMBINDEX, TROPONINI in the last 168 hours.  BNP: BNP (last 3 results) No results for input(s): PROBNP in the last 8760 hours.   Other results:    Imaging: Dg Abd Portable 1v  07/14/2014   CLINICAL DATA:  64 year old male with nausea vomiting. Initial encounter.  EXAM: PORTABLE ABDOMEN - 1 VIEW  COMPARISON:  CT Abdomen and Pelvis 07/08/2012. Abdomen radiographs 11/05/2009.  FINDINGS: Previous subtotal colectomy demonstrated  in 2014.  Portable AP supine view at 1359 hrs. Gas-filled small bowel loops, more abundant in the right abdomen. Loops measure up to 6 cm diameter (similar dilated loops up to 7 cm in 2011). There is distal bowel gas in the residual colon in the pelvis. Abdominal visceral contours are within normal limits. Resuscitation or pacing pads are in place over the epigastrium. Postoperative changes to the proximal left femur. No acute osseous abnormality identified. No definite pneumoperitoneum.  IMPRESSION: Gas pattern suggestive of ileus in this patient who is status post subtotal colectomy.   Electronically Signed   By: Lars Pinks M.D.   On: 07/14/2014 14:36     Medications:     Scheduled Medications: . antiseptic oral rinse  15 mL Mouth Rinse BID  . aspirin EC  81 mg Oral Daily  . atorvastatin  80 mg Oral q1800  . enoxaparin  (LOVENOX) injection  40 mg Subcutaneous Q24H  . insulin aspart  0-9 Units Subcutaneous TID WC  . isosorbide mononitrate  30 mg Oral Daily  . ticagrelor  90 mg Oral BID    Infusions: . sodium chloride 10 mL/hr at 07/14/14 0800  . sodium chloride 10 mL/hr at 07/14/14 0800  . amiodarone 30 mg/hr (07/14/14 2152)  . milrinone 0.375 mcg/kg/min (07/14/14 1800)    PRN Medications: acetaminophen, morphine injection, ondansetron (ZOFRAN) IV   Assessment:   1. Acute systolic heart failure due to iCM EF 20% -> cardiogenic shock 2. 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 DM2 4. PAD s/p L BKA. Occluded bilateral axillary arteries 5. Acute renal failure 6. Severe protein-calorie malnutrition pre-albumin 13.9 7. NSVT 8. Hyperkalemia 9. Colon CA s/p colectomy 2009 and adjuvant chemo  Plan/Discussion:    Hemodynamics and ventricular ectopy much improved on current regimen. Creatinine slightly worse likely due to previous shock and possibly component of CIN. I expect this will improve soon with improvement in hemodynamics. Will hold diuretics one more day. No b-blocker or ACE now due to shock/AKI.   Peri-prandial nausea persists despite restoration of CO. 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 KUB 07/14/14 suggesive of ileus. + bowel sounds. Hold off on NG. Add reglan.    Appreciate Dr. Antionette Char input. PCI next Monday. PT following.    Continue ASA/statin/Brilinta.    Length of Stay: 5   CLEGG,AMY NP-C 07/17/2014, 7:10 AM  Advanced Heart Failure Team Pager [phone] (M-F; [place])  Please contact [facility] for night-coverage after hours (4p -7a ) and weekends on [email or web address]  Patient seen and examined with Darrick Grinder, NP. We discussed all aspects of the encounter. I agree with the assessment and plan as stated above.   He is somewhat improved this am. BP soft but hemodynamics otherwise improved. Will give one dose of lasix this afternoon. If MAP < 60 can add low-dose levophed.  Continue milrinone. Renal function starting to plateau. Suspect will be better tomorrow.   Start Reglan for ileus.   Probable high-risk PCI of LM/LAD/LCX next Monday.    The patient is critically ill with multiple organ systems failure and requires high complexity decision making for assessment and support, frequent evaluation and titration of therapies, application of advanced monitoring technologies and extensive interpretation of multiple databases.   Critical Care Time personally devoted to patient care services described in this note is 35 Minutes.  Gaston Dase,MD 10:10 AM

## 2014-07-15 NOTE — Progress Notes (Signed)
Pt having frequent PVCs. P waves intermittently on telemetry. HR 110s. Pt stable. EKG done. On call notified. Ordered to send BMET and mag. Will continue to monitor.

## 2014-07-15 NOTE — Care Management Note (Addendum)
    Page 1 of 1   07/18/2014     9:22:38 AM CARE MANAGEMENT NOTE 07/18/2014  Patient:  Tyler Erickson, Tyler Erickson   Account Number:  [id number]  Date Initiated:  07/12/2014  Documentation initiated by:  Tyler Erickson  Subjective/Objective Assessment:   adm w heart failure     Action/Plan:   lives w wife, pcp dr dean mitchell   Anticipated DC Date:     Anticipated DC Plan:  [place]         Choice offered to / List presented to:             Status of service:   Medicare Important Message given?  YES (If response is "NO", the following Medicare IM given date fields will be blank) Date Medicare IM given:  07/14/2014 Medicare IM given by:  Tyler Erickson Date Additional Medicare IM given:  07/18/2014 Additional Medicare IM given by:  Tyler Erickson  Discharge Disposition:    Per UR Regulation:  Reviewed for med. necessity/level of care/duration of stay  If discussed at [place] of Stay Meetings, dates discussed:   07/16/2014  07/18/2014    Comments:  1/18 1040 Tyler Gwyn Hieronymus rn,bsn gave pt 30day free brilinta card. will have 47.00 per month copay w no prior auth req.

## 2014-07-16 LAB — BASIC METABOLIC PANEL
ANION GAP: 14 (ref 5–15)
BUN: 51 mg/dL — ABNORMAL HIGH (ref 6–23)
CO2: 18 mmol/L — AB (ref 19–32)
Calcium: 8 mg/dL — ABNORMAL LOW (ref 8.4–10.5)
Chloride: 100 mEq/L (ref 96–112)
Creatinine, Ser: 2.98 mg/dL — ABNORMAL HIGH (ref 0.50–1.35)
GFR calc non Af Amer: 21 mL/min — ABNORMAL LOW (ref >90)
GFR, EST AFRICAN AMERICAN: 24 mL/min — AB (ref >90)
Glucose, Bld: 148 mg/dL — ABNORMAL HIGH (ref 70–99)
POTASSIUM: 3.7 mmol/L (ref 3.5–5.1)
Sodium: 132 mmol/L — ABNORMAL LOW (ref 135–145)

## 2014-07-16 LAB — CBC
HCT: 36 % — ABNORMAL LOW (ref 39.0–52.0)
Hemoglobin: 12.3 g/dL — ABNORMAL LOW (ref 13.0–17.0)
MCH: 27.6 pg (ref 26.0–34.0)
MCHC: 34.2 g/dL (ref 30.0–36.0)
MCV: 80.7 fL (ref 78.0–100.0)
Platelets: 151 10*3/uL (ref 150–400)
RBC: 4.46 MIL/uL (ref 4.22–5.81)
RDW: 13.7 % (ref 11.5–15.5)
WBC: 13.3 10*3/uL — ABNORMAL HIGH (ref 4.0–10.5)

## 2014-07-16 LAB — CARBOXYHEMOGLOBIN
CARBOXYHEMOGLOBIN: 1.4 % (ref 0.5–1.5)
Carboxyhemoglobin: 1.1 % (ref 0.5–1.5)
METHEMOGLOBIN: 1.1 % (ref 0.0–1.5)
Methemoglobin: 1 % (ref 0.0–1.5)
O2 SAT: 96.3 %
O2 Saturation: 69.5 %
Total hemoglobin: 12 g/dL — ABNORMAL LOW (ref 13.5–18.0)
Total hemoglobin: 12.2 g/dL — ABNORMAL LOW (ref 13.5–18.0)

## 2014-07-16 LAB — GLUCOSE, CAPILLARY
GLUCOSE-CAPILLARY: 176 mg/dL — AB (ref 70–99)
Glucose-Capillary: 131 mg/dL — ABNORMAL HIGH (ref 70–99)
Glucose-Capillary: 145 mg/dL — ABNORMAL HIGH (ref 70–99)

## 2014-07-16 LAB — HEPARIN LEVEL (UNFRACTIONATED): Heparin Unfractionated: 0.36 IU/mL (ref 0.30–0.70)

## 2014-07-16 MED ORDER — HEPARIN BOLUS VIA INFUSION
3500.0000 [IU] | Freq: Once | INTRAVENOUS | Status: AC
Start: 2014-07-16 — End: 2014-07-16
  Administered 2014-07-16: 3500 [IU] via INTRAVENOUS
  Filled 2014-07-16: qty 3500

## 2014-07-16 MED ORDER — AMIODARONE LOAD VIA INFUSION
150.0000 mg | Freq: Once | INTRAVENOUS | Status: AC
  Administered 2014-07-16: 150 mg via INTRAVENOUS
  Filled 2014-07-16: qty 83.34

## 2014-07-16 MED ORDER — HEPARIN (PORCINE) IN NACL 100-0.45 UNIT/ML-% IJ SOLN
1000.0000 [IU]/h | INTRAMUSCULAR | Status: DC
  Administered 2014-07-16: 900 [IU]/h via INTRAVENOUS
  Administered 2014-07-17: 1000 [IU]/h via INTRAVENOUS
  Filled 2014-07-16 (×4): qty 250

## 2014-07-16 NOTE — Progress Notes (Signed)
Advanced Heart Failure Rounding Note   Subjective:    Yesterday he was started on reglan and given one dose of IV lasix. Remains on amiodarone drip and milrinone @ 0.375 mcg. Weight unchanged. Had  BM 07/16/14.   Feels weak. Still with nausea and mild dyspnea. No CP. Yesterday developed AFL/Atach with rates in 120s.   Creatinine 2.46>2.98  CVP 13 PA 40/24 (30)  PCWP 21 Thermo CO/CI 4.1/2.4 Co-ox 69% SVR (thermo) 1043     Objective:   Weight Range:  Vital Signs:   Temp:  [97.3 F (36.3 C)-98.4 F (36.9 C)] 98.2 F (36.8 C) (01/19 0500) Pulse Rate:  [58-125] 60 (01/19 0500) Resp:  [16-35] 18 (01/19 0500) BP: (87-154)/(20-85) 154/39 mmHg (01/19 0500) SpO2:  [93 %-98 %] 96 % (01/19 0500) Weight:  [145 lb 8.1 oz (66 kg)] 145 lb 8.1 oz (66 kg) (01/19 0500) Last BM Date: 07/16/2014  Weight change: Filed Weights   07/14/14 0445 07/27/2014 0454 07/16/14 0500  Weight: 145 lb 1 oz (65.8 kg) 145 lb 11.6 oz (66.1 kg) 145 lb 8.1 oz (66 kg)    Intake/Output:   Intake/Output Summary (Last 24 hours) at 07/16/14 0700 Last data filed at 07/16/14 0500  Gross per 24 hour  Intake 1619.7 ml  Output    500 ml  Net 1119.7 ml     Physical Exam: General: Chronically ill appearing. No dyspnea.  HEENT: normal x for poor dentition Neck: supple. RIJ swan  Carotids 2+ bilat; no bruits. No lymphadenopathy or thryomegaly appreciated. Cor: PMI nondisplaced. Regular tachy + PVCs. + 2/6 MR Lungs: decreased throughout. clear Abdomen: soft, nontender, nondistended. No hepatosplenomegaly. No bruits or masses. Good bowel sounds. Extremities: no cyanosis, clubbing, rash, edema. + L BKA L groin ok Neuro: alert & oriented x 3, cranial nerves grossly intact. moves all 4 extremities w/o difficulty. Affect pleasant.  Telemetry: AFL/ATACH at 125 with PVCs  Labs: Basic Metabolic Panel:  Recent Labs Lab 07/03/2014 2030  07/12/14 2322 07/13/14 0530 07/14/14 0448 07/19/2014 0234 07/16/14 0555  NA 138   < > 134* 135 133* 131* 132*  K 3.9  < > 4.0 3.8 3.2* 3.8 3.7  CL 102  < > 98 98 97 98 100  CO2 27  < > 23 23 24 23  18*  GLUCOSE 222*  < > 216* 223* 160* 132* 148*  BUN 32*  < > 39* 42* 48* 47* 51*  CREATININE 1.77*  < > 1.93* 2.12* 2.41* 2.46* 2.98*  CALCIUM 8.7  < > 8.4 8.3* 8.1* 8.0* 8.0*  MG 2.1  --   --   --   --  2.0  --   < > = values in this interval not displayed.  Liver Function Tests:  Recent Labs Lab 07/11/14 0258  AST 19  ALT 13  ALKPHOS 98  BILITOT 0.9  PROT 6.5  ALBUMIN 3.2*   No results for input(s): LIPASE, AMYLASE in the last 168 hours. No results for input(s): AMMONIA in the last 168 hours.  CBC:  Recent Labs Lab 07/23/2014 1213 07/21/2014 1543  WBC 10.8* 9.7  HGB 15.5 15.0  HCT 47.0 46.1  MCV 85.1 87.1  PLT 168 172    Cardiac Enzymes: No results for input(s): CKTOTAL, CKMB, CKMBINDEX, TROPONINI in the last 168 hours.  BNP: BNP (last 3 results) No results for input(s): PROBNP in the last 8760 hours.   Other results:    Imaging: Dg Abd Portable 1v  07/14/2014   CLINICAL DATA:  64 year old male with nausea vomiting. Initial encounter.  EXAM: PORTABLE ABDOMEN - 1 VIEW  COMPARISON:  CT Abdomen and Pelvis 07/08/2012. Abdomen radiographs 11/05/2009.  FINDINGS: Previous subtotal colectomy demonstrated in 2014.  Portable AP supine view at 1359 hrs. Gas-filled small bowel loops, more abundant in the right abdomen. Loops measure up to 6 cm diameter (similar dilated loops up to 7 cm in 2011). There is distal bowel gas in the residual colon in the pelvis. Abdominal visceral contours are within normal limits. Resuscitation or pacing pads are in place over the epigastrium. Postoperative changes to the proximal left femur. No acute osseous abnormality identified. No definite pneumoperitoneum.  IMPRESSION: Gas pattern suggestive of ileus in this patient who is status post subtotal colectomy.   Electronically Signed   By: Lars Pinks M.D.   On: 07/14/2014 14:36      Medications:     Scheduled Medications: . antiseptic oral rinse  15 mL Mouth Rinse BID  . aspirin EC  81 mg Oral Daily  . atorvastatin  80 mg Oral q1800  . enoxaparin (LOVENOX) injection  30 mg Subcutaneous Q24H  . insulin aspart  0-9 Units Subcutaneous TID WC  . isosorbide mononitrate  30 mg Oral Daily  . linagliptin  5 mg Oral Daily  . metoCLOPramide  5 mg Oral TID AC  . ticagrelor  90 mg Oral BID    Infusions: . sodium chloride 10 mL/hr (07/07/2014 1704)  . sodium chloride 10 mL/hr at 07/24/2014 2212  . amiodarone 30 mg/hr (07/20/2014 2007)  . milrinone 0.375 mcg/kg/min (07/05/2014 2007)    PRN Medications: acetaminophen, morphine injection, ondansetron (ZOFRAN) IV   Assessment:   1. Acute systolic heart failure due to iCM EF 20% -> cardiogenic shock 2. 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 DM2 4. PAD s/p L BKA. Occluded bilateral axillary arteries 5. Acute renal failure 6. Severe protein-calorie malnutrition pre-albumin 13.9 7. NSVT 8. Hyperkalemia 9. Colon CA s/p colectomy 2009 and adjuvant chemo 10. Atrial flutter/a tach  Plan/Discussion:    Hemodynamics and ventricular ectopy much improved on current regimen. Creatinine slightly worse likely due to previous shock and possibly component of CIN. I expect this will improve soon with improvement in hemodynamics. Will hold diuretics one more day. No b-blocker or ACE now due to shock/AKI.   Peri-prandial nausea persists despite restoration of CO. 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 KUB 07/14/14 suggesive of ileus. + bowel sounds. Continue reglan.     Probable high-risk PCI of LM/LAD/LCX next Monday.   PT following.  Needs to get oob.    Continue ASA/statin/Brilinta.  Length of Stay: 6   CLEGG,AMY NP-C 07/16/2014, 7:00 AM  Advanced Heart Failure Team Pager [phone] (M-F; 7a - 4p)  Please contact [facility] for night-coverage after hours (4p -7a ) and weekends on [email or web address]  Patient seen and  examined with Darrick Grinder, NP. We discussed all aspects of the encounter. I agree with the assessment and plan as stated above.   Cardiac output remains preserved on milrinone but he continues to struggle with ileus and worsening renal function. Yesterday it appeared creatinine plateaued but today is worse. Not sure why this is. ? Onset of atrial tach. BP now stable. Will continue current regimen today. Start heparin. Can consider DC-CV tomorrow if needed.   Long talk with him and his sister about situation and possibility that we may never have him good enough for high-risk PCI attempt though we will keep pressing for this.  The patient  is critically ill with multiple organ systems failure and requires high complexity decision making for assessment and support, frequent evaluation and titration of therapies, application of advanced monitoring technologies and extensive interpretation of multiple databases.   Critical Care Time personally devoted to patient care services described in this note is 35 Minutes.  Dion Sibal,MD 8:47 AM

## 2014-07-16 NOTE — Progress Notes (Signed)
Physical Therapy Treatment Patient Details Name: Tyler Erickson MRN: [id number] DOB: [birth year] Today's Date: 07/16/2014    History of Present Illness 64 y.o. male admitted to [facility] on 07/09/2014 with dyspnea, chest pain, and abnormal EKG. Pt with acute on chronic heart failure. Pt with severe multivessel CAD.  He is being evaluated by cardiology for high risk PCI placement.  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     PT Comments    Pt making steady progress. Pt with flat affect and requires encouragement to initiate mobility.  Follow Up Recommendations  No PT follow up;Supervision for mobility/OOB     Equipment Recommendations  None recommended by PT    Recommendations for Other Services       Precautions / Restrictions Precautions Precautions: Fall Precaution Comments: Lines/tubes including Swan    Mobility  Bed Mobility Overal bed mobility: Needs Assistance Bed Mobility: Supine to Sit     Supine to sit: Min guard     General bed mobility comments: Pt requires incr time.  Transfers Overall transfer level: Needs assistance Equipment used: None Transfers: Sit to/from [facility] Sit to Stand: Min assist;+2 safety/equipment Stand pivot transfers: Min assist;+2 safety/equipment       General transfer comment: assist for balance to perform stand pivot after pt had donned his prosthesis.  Ambulation/Gait                 Stairs            Wheelchair Mobility    Modified Rankin (Stroke Patients Only)       Balance Overall balance assessment: Needs assistance Sitting-balance support: No upper extremity supported;Feet supported Sitting balance-Leahy Scale: Good Sitting balance - Comments: Able to don prosthetic without loss of balance.    Standing balance support: Single extremity supported Standing balance-Leahy Scale: Poor Standing balance comment: Single extremity support and min guard for static standing.                    Cognition Arousal/Alertness: Awake/alert Behavior During Therapy: Flat affect Overall Cognitive Status: Within Functional Limits for tasks assessed                      Exercises      General Comments        Pertinent Vitals/Pain Pain Assessment: No/denies pain    Home Living                      Prior Function            PT Goals (current goals can now be found in the care plan section) Progress towards PT goals: Progressing toward goals    Frequency  Min 3X/week    PT Plan Current plan remains appropriate    Co-evaluation             End of Session Equipment Utilized During Treatment: Oxygen;Other (comment) (prosthesis) Activity Tolerance: Patient tolerated treatment well Patient left: with call bell/phone within reach;in chair     Time: 0910-0930 PT Time Calculation (min) (ACUTE ONLY): 20 min  Charges:  $Gait Training: 8-22 mins                    G Codes:      Teanna Elem  07/16/2014, 10:25 AM  Suanne Marker PT [phone]

## 2014-07-16 NOTE — Progress Notes (Signed)
ANTICOAGULATION CONSULT NOTE - Follow Up Consult  Pharmacy Consult for Heparin Indication: atrial fibrillation  No Known Allergies  Patient Measurements: Height: 5\' 6"  (167.6 cm) Weight: 145 lb 8.1 oz (66 kg) IBW/kg (Calculated) : 63.8 Heparin Dosing Weight: 66 kg  Vital Signs: Temp: 97.3 F (36.3 C) (01/19 1300) BP: 112/79 mmHg (01/19 1300) Pulse Rate: 123 (01/19 1300)  Labs:  Recent Labs  07/14/14 0448 07/12/2014 0234 07/16/14 0555 07/16/14 0845 07/16/14 1740  HGB  --   --   --  12.3*  --   HCT  --   --   --  36.0*  --   PLT  --   --   --  151  --   HEPARINUNFRC  --   --   --   --  0.36  CREATININE 2.41* 2.46* 2.98*  --   --     Estimated Creatinine Clearance: 23.2 mL/min (by C-G formula based on Cr of 2.98).  Assessment:   Heparin level is therapeutic tonight (0.36) on 900 units/hr.  Goal of Therapy:  Heparin level 0.3-0.7 units/ml Monitor platelets by anticoagulation protocol: Yes   Plan:   Continue heparin drip at 900 units/hr.  Next heparin level and CBC in am.  Arty Baumgartner, [place] Pager: [phone] 07/16/2014,6:47 PM

## 2014-07-16 NOTE — Progress Notes (Addendum)
ANTICOAGULATION CONSULT NOTE - Initial Consult  Pharmacy Consult for Heparin Indication: atrial fibrillation  No Known Allergies  Patient Measurements: Height: 5\' 6"  (167.6 cm) Weight: 145 lb 8.1 oz (66 kg) IBW/kg (Calculated) : 63.8 Heparin Dosing Weight: 66 kg  Vital Signs: Temp: 97.3 F (36.3 C) (01/19 1300) BP: 112/79 mmHg (01/19 1300) Pulse Rate: 123 (01/19 1300)  Labs:  Recent Labs  07/14/14 0448 07/16/2014 0234 07/16/14 0555 07/16/14 0845  HGB  --   --   --  12.3*  HCT  --   --   --  36.0*  PLT  --   --   --  151  CREATININE 2.41* 2.46* 2.98*  --     Estimated Creatinine Clearance: 23.2 mL/min (by C-G formula based on Cr of 2.98).   Medical History: Past Medical History  Diagnosis Date  . History of colon cancer     S/P surgery/chemotherapy  . Peripheral sensory neuropathy due to type 2 diabetes mellitus   . CVA (cerebral infarction) 06/2012    [name] 07/08/2014  . Kidney stones   . S/P unilateral BKA (below knee amputation) left 07/28/2012  . Stage 3 chronic kidney disease due to type 2 diabetes mellitus 07/08/2014  . Ischemic cardiomyopathy   . Cervical spinal stenosis   . Coronary artery disease   . Colon cancer   . Heart murmur   . Peripheral vascular disease     involving both arms/notes 07/08/2014  . Atherosclerosis of arteries of extremities 07/08/2014  . Type 2 diabetes mellitus with vascular disease 03/14/2009  . Pleural effusion 06/2012    [name] 07/08/2014  . CHF (congestive heart failure)     systolic/notes 0/96/0454  . Myocardial infarction     evidence of a previous old anterior MI on his EKG /notes 07/08/2014    Assessment: 64 yo male with atrial tachycardia, time of onset unclear.  Pharmacy asked to begin anticoagulation with IV heparin.  Had been receiving Lovenox for DVT prophylaxis, but hasn't had today's dose yet.  Baseline CBC WNL.  Goal of Therapy:  Heparin level 0.3-0.7 units/ml Monitor platelets by anticoagulation protocol: Yes   Plan:  1. Start IV heparin with bolus of 3500 units x 1. 2. Then start heparin gtt at 900 units/hr. 3. Check heparin level 8 hrs after gtt starts. 4. Daily heparin level and CBC. 5. F/u plans for oral anticoagulation (maybe after cath?)  Uvaldo Rising, BCPS  Clinical Pharmacist Pager [phone]  07/16/2014 1:25 PM

## 2014-07-17 ENCOUNTER — Inpatient Hospital Stay (HOSPITAL_COMMUNITY): Payer: Commercial Managed Care - HMO

## 2014-07-17 DIAGNOSIS — I472 Ventricular tachycardia: Secondary | ICD-10-CM

## 2014-07-17 LAB — GLUCOSE, CAPILLARY
GLUCOSE-CAPILLARY: 126 mg/dL — AB (ref 70–99)
Glucose-Capillary: 137 mg/dL — ABNORMAL HIGH (ref 70–99)
Glucose-Capillary: 158 mg/dL — ABNORMAL HIGH (ref 70–99)
Glucose-Capillary: 159 mg/dL — ABNORMAL HIGH (ref 70–99)
Glucose-Capillary: 149 mg/dL — ABNORMAL HIGH (ref 70–99)

## 2014-07-17 LAB — CARBOXYHEMOGLOBIN
CARBOXYHEMOGLOBIN: 1.1 % (ref 0.5–1.5)
Carboxyhemoglobin: 1.2 % (ref 0.5–1.5)
METHEMOGLOBIN: 1 % (ref 0.0–1.5)
METHEMOGLOBIN: 1 % (ref 0.0–1.5)
O2 SAT: 81.4 %
O2 Saturation: 70 %
TOTAL HEMOGLOBIN: 10.7 g/dL — AB (ref 13.5–18.0)
Total hemoglobin: 11.7 g/dL — ABNORMAL LOW (ref 13.5–18.0)

## 2014-07-17 LAB — CBC
HCT: 33 % — ABNORMAL LOW (ref 39.0–52.0)
Hemoglobin: 11.2 g/dL — ABNORMAL LOW (ref 13.0–17.0)
MCH: 27.1 pg (ref 26.0–34.0)
MCHC: 33.9 g/dL (ref 30.0–36.0)
MCV: 79.7 fL (ref 78.0–100.0)
Platelets: 130 10*3/uL — ABNORMAL LOW (ref 150–400)
RBC: 4.14 MIL/uL — ABNORMAL LOW (ref 4.22–5.81)
RDW: 13.7 % (ref 11.5–15.5)
WBC: 10 10*3/uL (ref 4.0–10.5)

## 2014-07-17 LAB — BASIC METABOLIC PANEL
Anion gap: 10 (ref 5–15)
BUN: 58 mg/dL — AB (ref 6–23)
CO2: 21 mmol/L (ref 19–32)
CREATININE: 3.68 mg/dL — AB (ref 0.50–1.35)
Calcium: 8.1 mg/dL — ABNORMAL LOW (ref 8.4–10.5)
Chloride: 100 mEq/L (ref 96–112)
GFR calc non Af Amer: 16 mL/min — ABNORMAL LOW (ref >90)
GFR, EST AFRICAN AMERICAN: 19 mL/min — AB (ref >90)
Glucose, Bld: 136 mg/dL — ABNORMAL HIGH (ref 70–99)
Potassium: 3.6 mmol/L (ref 3.5–5.1)
Sodium: 131 mmol/L — ABNORMAL LOW (ref 135–145)

## 2014-07-17 LAB — HEPARIN LEVEL (UNFRACTIONATED)
HEPARIN UNFRACTIONATED: 0.35 [IU]/mL (ref 0.30–0.70)
Heparin Unfractionated: 0.27 IU/mL — ABNORMAL LOW (ref 0.30–0.70)

## 2014-07-17 MED ORDER — FUROSEMIDE 10 MG/ML IJ SOLN
80.0000 mg | Freq: Once | INTRAMUSCULAR | Status: AC
  Administered 2014-07-17: 80 mg via INTRAVENOUS
  Filled 2014-07-17: qty 8

## 2014-07-17 NOTE — Progress Notes (Signed)
Pt observed to have flat affect, depressed and angry at times. Pt refused to take bath this am and did not want to try to standing on scale. Nurse attemped to offer comfort measures, but pt refused. Will continue to monitor.

## 2014-07-17 NOTE — Progress Notes (Signed)
ANTICOAGULATION CONSULT NOTE - Follow Up Consult  Pharmacy Consult for Heparin Indication: atrial fibrillation  No Known Allergies  Patient Measurements: Height: 5\' 6"  (167.6 cm) Weight: 150 lb 5.7 oz (68.2 kg) (bed weight ) IBW/kg (Calculated) : 63.8 Heparin Dosing Weight: 66 kg  Vital Signs: Temp: 97.9 F (36.6 C) (01/20 1100) Temp Source: Core (Comment) (01/20 0400) BP: 145/50 mmHg (01/20 1100) Pulse Rate: 105 (01/20 1100)  Labs:  Recent Labs  07/13/2014 0234 07/16/14 0555 07/16/14 0845 07/16/14 1740 07/17/14 0440 07/17/14 0445 07/17/14 1417  HGB  --   --  12.3*  --  11.2*  --   --   HCT  --   --  36.0*  --  33.0*  --   --   PLT  --   --  151  --  130*  --   --   HEPARINUNFRC  --   --   --  0.36 0.27*  --  0.35  CREATININE 2.46* 2.98*  --   --   --  3.68*  --     Estimated Creatinine Clearance: 18.8 mL/min (by C-G formula based on Cr of 3.68).  Assessment: 64 yo male on IV heparin for atrial tachycardia of unknown onset.  Heparin level is therapeutic today (0.35) on 1000 units/hr.  Goal of Therapy:  Heparin level 0.3-0.7 units/ml Monitor platelets by anticoagulation protocol: Yes   Plan:   Continue heparin drip at 1000 units/hr.  Next heparin level and CBC in am.  Uvaldo Rising, BCPS  Clinical Pharmacist Pager [phone]  07/17/2014 3:28 PM

## 2014-07-17 NOTE — Progress Notes (Signed)
Physical Therapy Treatment Patient Details Name: Tyler Erickson MRN: [id number] DOB: 12/09/1950 Today's Date: 07/17/2014    History of Present Illness 64 y.o. male admitted to [facility] on 07/01/2014 with dyspnea, chest pain, and abnormal EKG. Pt with acute on chronic heart failure. Pt with severe multivessel CAD.  He is being evaluated by cardiology for high risk PCI placement.  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     PT Comments    Pt with slow progress. Remains with very flat affect.  Follow Up Recommendations  No PT follow up;Supervision for mobility/OOB     Equipment Recommendations  None recommended by PT    Recommendations for Other Services       Precautions / Restrictions Precautions Precautions: Fall Precaution Comments: Lines/tubes including Swan    Mobility  Bed Mobility Overal bed mobility: Needs Assistance Bed Mobility: Supine to Sit     Supine to sit: Min assist     General bed mobility comments: Assist to bring trunk up.  Transfers Overall transfer level: Needs assistance Equipment used: 2 person hand held assist Transfers: Sit to/from [facility] Sit to Stand: Mod assist;+2 safety/equipment Stand pivot transfers: Min assist;+2 safety/equipment       General transfer comment: Asssist to bring hips up and to pivot. Pt donned prosthesis  Ambulation/Gait Ambulation/Gait assistance: Min assist;+2 safety/equipment Ambulation Distance (Feet): 2 Feet Assistive device: 1 person hand held assist Gait Pattern/deviations: Step-through pattern;Decreased step length - right;Decreased step length - left;Trunk flexed   Gait velocity interpretation: Below normal speed for age/gender General Gait Details: Amb bsc to chair with forearm support of left  arm.   Stairs            Wheelchair Mobility    Modified Rankin (Stroke Patients Only)       Balance Overall balance assessment: Needs assistance Sitting-balance support: No upper extremity supported;Feet supported Sitting balance-Leahy Scale: Normal Sitting balance - Comments: Able to don prosthetic without loss of balance.   Standing balance support: Single extremity supported Standing balance-Leahy Scale: Poor Standing balance comment: Support with LUE and min a for static standing.                    Cognition Arousal/Alertness: Awake/alert Behavior During Therapy: Flat affect Overall Cognitive Status: Within Functional Limits for tasks assessed                      Exercises      General Comments        Pertinent Vitals/Pain Pain Assessment: No/denies pain    Home Living                      Prior Function            PT Goals (current goals can now be found in the care plan section) Progress towards PT goals: Progressing toward goals    Frequency  Min 3X/week    PT Plan Current plan remains appropriate    Co-evaluation             End of Session Equipment Utilized During Treatment: Oxygen;Other (comment) (prosthesis) Activity Tolerance: Patient tolerated treatment well Patient left: in chair;with call bell/phone within reach     Time: 1130-1200 PT Time Calculation (min) (ACUTE  ONLY): 30 min  Charges:  $Gait Training: 23-37 mins                    G Codes:      Manpreet Kemmer 08-01-14, 12:12 PM  [employer] PT [phone]

## 2014-07-17 NOTE — Progress Notes (Signed)
ANTICOAGULATION CONSULT NOTE - Follow Up Consult  Pharmacy Consult for heparin Indication: atrial fibrillation  Labs:  Recent Labs  07/04/2014 0234 07/16/14 0555 07/16/14 0845 07/16/14 1740 07/17/14 0440  HGB  --   --  12.3*  --  11.2*  HCT  --   --  36.0*  --  33.0*  PLT  --   --  151  --  130*  HEPARINUNFRC  --   --   --  0.36 0.27*  CREATININE 2.46* 2.98*  --   --   --      Assessment: 64yo male now subtherapeutic on heparin after one level at lower end of goal.  Goal of Therapy:  Heparin level 0.3-0.7 units/ml   Plan:  Will increase heparin gtt slightly to 1000 units/hr and check level in [place], PharmD, BCPS  07/17/2014,5:39 AM

## 2014-07-17 NOTE — Progress Notes (Signed)
Advanced Heart Failure Rounding Note   Subjective:    Yesterday he was started on reglan and given one dose of IV lasix. Remains on amiodarone drip and milrinone @ 0.375 mcg. Weight up 5 pounds.  A tach/AFL converted to NSR on amio  Feels weak. Still with nausea. No CP. Up with cardiac rehab today.   Creatinine 2.46>2.98>3.68   CVP 12 PA 41/20 PCWP 24 Thermo CO/CI 4.0/2.3 Co-ox 70% SVR (thermo) 1494   Objective:   Weight Range:  Vital Signs:   Temp:  [97.3 F (36.3 C)-98.2 F (36.8 C)] 97.9 F (36.6 C) (01/20 1100) Pulse Rate:  [63-127] 105 (01/20 1100) Resp:  [17-47] 26 (01/20 1100) BP: (99-174)/(33-79) 145/50 mmHg (01/20 1100) SpO2:  [92 %-99 %] 96 % (01/20 1100) Weight:  [68.2 kg (150 lb 5.7 oz)] 68.2 kg (150 lb 5.7 oz) (01/20 0500) Last BM Date: 07/16/14  Weight change: Filed Weights   07/02/2014 0454 07/16/14 0500 07/17/14 0500  Weight: 66.1 kg (145 lb 11.6 oz) 66 kg (145 lb 8.1 oz) 68.2 kg (150 lb 5.7 oz)    Intake/Output:   Intake/Output Summary (Last 24 hours) at 07/17/14 1217 Last data filed at 07/17/14 1100  Gross per 24 hour  Intake 1505.5 ml  Output      0 ml  Net 1505.5 ml     Physical Exam: General: Chronically ill appearing. No dyspnea. Sitting in chair HEENT: normal x for poor dentition Neck: supple. RIJ swan  Carotids 2+ bilat; no bruits. No lymphadenopathy or thryomegaly appreciated. Cor: PMI nondisplaced. Regular tachy + PVCs. + 2/6 MR Lungs: decreased throughout. clear Abdomen: soft, nontender, nondistended. No hepatosplenomegaly. No bruits or masses. Good bowel sounds. Extremities: no cyanosis, clubbing, rash, 1+ edema on R. + L BKA  Neuro: alert & oriented x 3, cranial nerves grossly intact. moves all 4 extremities w/o difficulty. Affect pleasant.  Telemetry: Sinus tach ~ 100  Labs: Basic Metabolic Panel:  Recent Labs Lab 07/02/2014 2030  07/13/14 0530 07/14/14 0448 07/26/2014 0234 07/16/14 0555 07/17/14 0445  NA 138  < > 135  133* 131* 132* 131*  K 3.9  < > 3.8 3.2* 3.8 3.7 3.6  CL 102  < > 98 97 98 100 100  CO2 27  < > 23 24 23  18* 21  GLUCOSE 222*  < > 223* 160* 132* 148* 136*  BUN 32*  < > 42* 48* 47* 51* 58*  CREATININE 1.77*  < > 2.12* 2.41* 2.46* 2.98* 3.68*  CALCIUM 8.7  < > 8.3* 8.1* 8.0* 8.0* 8.1*  MG 2.1  --   --   --  2.0  --   --   < > = values in this interval not displayed.  Liver Function Tests:  Recent Labs Lab 07/11/14 0258  AST 19  ALT 13  ALKPHOS 98  BILITOT 0.9  PROT 6.5  ALBUMIN 3.2*   No results for input(s): LIPASE, AMYLASE in the last 168 hours. No results for input(s): AMMONIA in the last 168 hours.  CBC:  Recent Labs Lab 07/16/2014 1543 07/16/14 0845 07/17/14 0440  WBC 9.7 13.3* 10.0  HGB 15.0 12.3* 11.2*  HCT 46.1 36.0* 33.0*  MCV 87.1 80.7 79.7  PLT 172 151 130*    Cardiac Enzymes: No results for input(s): CKTOTAL, CKMB, CKMBINDEX, TROPONINI in the last 168 hours.  BNP: BNP (last 3 results) No results for input(s): PROBNP in the last 8760 hours.   Other results:    Imaging: No results  found.   Medications:     Scheduled Medications: . antiseptic oral rinse  15 mL Mouth Rinse BID  . aspirin EC  81 mg Oral Daily  . atorvastatin  80 mg Oral q1800  . insulin aspart  0-9 Units Subcutaneous TID WC  . isosorbide mononitrate  30 mg Oral Daily  . linagliptin  5 mg Oral Daily  . metoCLOPramide  5 mg Oral TID AC  . ticagrelor  90 mg Oral BID    Infusions: . sodium chloride 10 mL/hr at 07/17/14 1100  . sodium chloride 10 mL/hr at 07/17/14 1100  . amiodarone 30 mg/hr (07/17/14 1100)  . heparin 1,000 Units/hr (07/17/14 1100)  . milrinone 0.375 mcg/kg/min (07/17/14 1100)    PRN Medications: acetaminophen, morphine injection, ondansetron (ZOFRAN) IV   Assessment:   1. Acute systolic heart failure due to iCM EF 20% -> cardiogenic shock 2. 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 DM2 4. PAD s/p L BKA. Occluded bilateral axillary  arteries 5. Acute renal failure 6. Severe protein-calorie malnutrition pre-albumin 13.9 7. NSVT 8. Hyperkalemia 9. Colon CA s/p colectomy 2009 and adjuvant chemo 10. Atrial flutter/a tach  Plan/Discussion:    Hemodynamics and ventricular ectopy much improved on current regimen. Volume status creeping up. Will give lasix 80IV today. Can remove swan (keep sheath)  Unfortunately creatinine worse again likely due to previous shock and component of CIN. Hopefully this will improve soon with improvement in hemodynamics.No b-blocker or ACE now due to shock/AKI. Check renal u/s.   Atrial tach and VT much improved with amio.   Peri-prandial nausea persists despite restoration of CO. 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 KUB 07/14/14 suggesive of ileus. + bowel sounds. Continue reglan. Repeat KUB today.     Probable high-risk PCI of LM/LAD/LCX when /if renal function improves.   PT following.    Continue ASA/statin/Brilinta.  The patient is critically ill with multiple organ systems failure and requires high complexity decision making for assessment and support, frequent evaluation and titration of therapies, application of advanced monitoring technologies and extensive interpretation of multiple databases.   Critical Care Time devoted to patient care services described in this note is 35 Minutes.    Length of Stay: 7   Glori Bickers MD  07/17/2014, 12:17 PM  Advanced Heart Failure Team Pager [phone] (M-F; 7a - 4p)  Please contact [facility] for night-coverage after hours (4p -7a ) and weekends on [email or web address]

## 2014-07-18 ENCOUNTER — Inpatient Hospital Stay (HOSPITAL_COMMUNITY): Payer: Commercial Managed Care - HMO

## 2014-07-18 DIAGNOSIS — R570 Cardiogenic shock: Secondary | ICD-10-CM

## 2014-07-18 LAB — CBC
HEMATOCRIT: 33.6 % — AB (ref 39.0–52.0)
Hemoglobin: 11.5 g/dL — ABNORMAL LOW (ref 13.0–17.0)
MCH: 27.7 pg (ref 26.0–34.0)
MCHC: 34.2 g/dL (ref 30.0–36.0)
MCV: 81 fL (ref 78.0–100.0)
Platelets: 149 10*3/uL — ABNORMAL LOW (ref 150–400)
RBC: 4.15 MIL/uL — ABNORMAL LOW (ref 4.22–5.81)
RDW: 13.9 % (ref 11.5–15.5)
WBC: 12 10*3/uL — ABNORMAL HIGH (ref 4.0–10.5)

## 2014-07-18 LAB — POCT ACTIVATED CLOTTING TIME
ACTIVATED CLOTTING TIME: 98 s
ACTIVATED CLOTTING TIME: 165 s
Activated Clotting Time: 140 seconds
Activated Clotting Time: 208 seconds
Activated Clotting Time: 220 seconds
Activated Clotting Time: 214 seconds

## 2014-07-18 LAB — URINALYSIS, ROUTINE W REFLEX MICROSCOPIC
Glucose, UA: NEGATIVE mg/dL
Hgb urine dipstick: NEGATIVE
KETONES UR: NEGATIVE mg/dL
LEUKOCYTES UA: NEGATIVE
NITRITE: NEGATIVE
Protein, ur: NEGATIVE mg/dL
Specific Gravity, Urine: 1.019 (ref 1.005–1.030)
Urobilinogen, UA: 0.2 mg/dL (ref 0.0–1.0)
pH: 5 (ref 5.0–8.0)

## 2014-07-18 LAB — BASIC METABOLIC PANEL
ANION GAP: 15 (ref 5–15)
BUN: 65 mg/dL — ABNORMAL HIGH (ref 6–23)
CHLORIDE: 95 meq/L — AB (ref 96–112)
CO2: 18 mmol/L — AB (ref 19–32)
Calcium: 8 mg/dL — ABNORMAL LOW (ref 8.4–10.5)
Creatinine, Ser: 4.78 mg/dL — ABNORMAL HIGH (ref 0.50–1.35)
GFR, EST AFRICAN AMERICAN: 14 mL/min — AB (ref >90)
GFR, EST NON AFRICAN AMERICAN: 12 mL/min — AB (ref >90)
Glucose, Bld: 154 mg/dL — ABNORMAL HIGH (ref 70–99)
POTASSIUM: 3.5 mmol/L (ref 3.5–5.1)
SODIUM: 128 mmol/L — AB (ref 135–145)

## 2014-07-18 LAB — RENAL FUNCTION PANEL
ALBUMIN: 2.6 g/dL — AB (ref 3.5–5.2)
Anion gap: 15 (ref 5–15)
BUN: 68 mg/dL — ABNORMAL HIGH (ref 6–23)
CHLORIDE: 96 meq/L (ref 96–112)
CO2: 18 mmol/L — AB (ref 19–32)
CREATININE: 5.19 mg/dL — AB (ref 0.50–1.35)
Calcium: 7.8 mg/dL — ABNORMAL LOW (ref 8.4–10.5)
GFR calc Af Amer: 12 mL/min — ABNORMAL LOW (ref >90)
GFR, EST NON AFRICAN AMERICAN: 11 mL/min — AB (ref >90)
Glucose, Bld: 147 mg/dL — ABNORMAL HIGH (ref 70–99)
PHOSPHORUS: 5.8 mg/dL — AB (ref 2.3–4.6)
POTASSIUM: 3.9 mmol/L (ref 3.5–5.1)
SODIUM: 129 mmol/L — AB (ref 135–145)

## 2014-07-18 LAB — GLUCOSE, CAPILLARY
GLUCOSE-CAPILLARY: 162 mg/dL — AB (ref 70–99)
Glucose-Capillary: 154 mg/dL — ABNORMAL HIGH (ref 70–99)
Glucose-Capillary: 105 mg/dL — ABNORMAL HIGH (ref 70–99)

## 2014-07-18 LAB — HEPARIN LEVEL (UNFRACTIONATED): Heparin Unfractionated: 0.64 IU/mL (ref 0.30–0.70)

## 2014-07-18 MED ORDER — HEPARIN SODIUM (PORCINE) 1000 UNIT/ML DIALYSIS
1000.0000 [IU] | INTRAMUSCULAR | Status: DC | PRN
  Filled 2014-07-18: qty 6
  Filled 2014-07-18: qty 3

## 2014-07-18 MED ORDER — FUROSEMIDE 10 MG/ML IJ SOLN
80.0000 mg | Freq: Once | INTRAMUSCULAR | Status: AC
  Administered 2014-07-18: 80 mg via INTRAVENOUS
  Filled 2014-07-18: qty 8

## 2014-07-18 MED ORDER — HEPARIN (PORCINE) 2000 UNITS/L FOR CRRT
INTRAVENOUS_CENTRAL | Status: DC | PRN
  Administered 2014-07-18 – 2014-07-19 (×2): via INTRAVENOUS_CENTRAL
  Filled 2014-07-18 (×3): qty 1000

## 2014-07-18 MED ORDER — SODIUM CHLORIDE 0.9 % IJ SOLN
250.0000 [IU]/h | INTRAMUSCULAR | Status: DC
  Administered 2014-07-18: 250 [IU]/h via INTRAVENOUS_CENTRAL
  Filled 2014-07-18 (×2): qty 2

## 2014-07-18 MED ORDER — PRISMASOL BGK 4/2.5 32-4-2.5 MEQ/L IV SOLN
INTRAVENOUS | Status: DC
  Administered 2014-07-18 – 2014-07-21 (×5): via INTRAVENOUS_CENTRAL
  Filled 2014-07-18 (×6): qty 5000

## 2014-07-18 MED ORDER — HEPARIN BOLUS VIA INFUSION
2500.0000 [IU] | INTRAVENOUS | Status: AC
  Administered 2014-07-18: 2500 [IU] via INTRAVENOUS
  Filled 2014-07-18: qty 2500

## 2014-07-18 MED ORDER — HEPARIN BOLUS VIA INFUSION (CRRT)
1000.0000 [IU] | INTRAVENOUS | Status: DC | PRN
  Administered 2014-07-18: 1000 [IU] via INTRAVENOUS_CENTRAL
  Filled 2014-07-18 (×2): qty 1000

## 2014-07-18 MED ORDER — CHLORHEXIDINE GLUCONATE 0.12 % MT SOLN
OROMUCOSAL | Status: AC
  Administered 2014-07-18: 15 mL
  Filled 2014-07-18: qty 15

## 2014-07-18 MED ORDER — HEPARIN (PORCINE) IN NACL 100-0.45 UNIT/ML-% IJ SOLN
800.0000 [IU]/h | INTRAMUSCULAR | Status: DC
  Administered 2014-07-18 (×2): 950 [IU]/h via INTRAVENOUS
  Filled 2014-07-18: qty 250

## 2014-07-18 MED ORDER — PRISMASOL BGK 4/2.5 32-4-2.5 MEQ/L IV SOLN
INTRAVENOUS | Status: DC
  Administered 2014-07-18 – 2014-07-21 (×21): via INTRAVENOUS_CENTRAL
  Filled 2014-07-18 (×31): qty 5000

## 2014-07-18 MED ORDER — HEPARIN (PORCINE) IN NACL 100-0.45 UNIT/ML-% IJ SOLN
950.0000 [IU]/h | INTRAMUSCULAR | Status: DC
  Administered 2014-07-18: 950 [IU]/h via INTRAVENOUS
  Filled 2014-07-18: qty 250

## 2014-07-18 MED ORDER — PROMETHAZINE HCL 25 MG/ML IJ SOLN
12.5000 mg | Freq: Three times a day (TID) | INTRAMUSCULAR | Status: DC | PRN
  Administered 2014-07-18 (×2): 12.5 mg via INTRAVENOUS
  Filled 2014-07-18 (×2): qty 1

## 2014-07-18 MED ORDER — PRISMASOL BGK 4/2.5 32-4-2.5 MEQ/L IV SOLN
INTRAVENOUS | Status: DC
  Administered 2014-07-18 – 2014-07-20 (×2): via INTRAVENOUS_CENTRAL
  Filled 2014-07-18 (×4): qty 5000

## 2014-07-18 NOTE — Consult Note (Signed)
Requesting Physician:  Dr. Jeffie Pollock Reason for Consult:  Acute kidney injury HPI: The patient is a 64 y.o. year-old WM with a background of DM with peripheral neuropathy, HLD, HTN, h/o colon cancer in the past, CAD, PAD involving both arms (right axillary artery, left brachial artery). Left BKA. Known cardiomyopathy. Admitted with CP, SOB, underwent cath 1/13 with findings of severe left ventricular dysfunction, severe left main and 3V disease  By ECHO EF which was 55% in 2014 down to 23-30%.  Creatinine at the time of cath was 1,93 and has steadily increased to today's value of 4.78.  Blood pressures which can only be taken in the right leg have been low 70's-90's and he is on high doses of milrinone. Has nausea, vomiting and increased confusion - unclear if low output state or uremia. We are asked to see.   CREATININE, SER  Date/Time Value Ref Range Status  07/18/2014 02:02 AM 4.78* 0.50 - 1.35 mg/dL Final  07/17/2014 04:45 AM 3.68* 0.50 - 1.35 mg/dL Final  07/16/2014 05:55 AM 2.98* 0.50 - 1.35 mg/dL Final  07/12/2014 02:34 AM 2.46* 0.50 - 1.35 mg/dL Final  07/14/2014 04:48 AM 2.41* 0.50 - 1.35 mg/dL Final  07/13/2014 05:30 AM 2.12* 0.50 - 1.35 mg/dL Final  07/12/2014 11:22 PM 1.93* 0.50 - 1.35 mg/dL Final  07/12/2014 04:35 AM 1.84* 0.50 - 1.35 mg/dL Final  07/11/2014 02:58 AM 1.81* 0.50 - 1.35 mg/dL Final  07/21/2014 08:30 PM 1.77* 0.50 - 1.35 mg/dL Final  07/21/2014 03:43 PM 1.87* 0.50 - 1.35 mg/dL Final  09/14/2013 03:50 AM 1.38* 0.50 - 1.35 mg/dL Final  09/13/2013 05:32 AM 1.19 0.50 - 1.35 mg/dL Final  09/12/2013 02:01 PM 1.46* 0.50 - 1.35 mg/dL Final  10/11/2012 10:00 AM 1.03 0.50 - 1.35 mg/dL Final  07/30/2012 03:58 AM 1.05 0.50 - 1.35 mg/dL Final  07/29/2012 05:30 AM 0.94 0.50 - 1.35 mg/dL Final    Past Medical History  Diagnosis Date  . History of colon cancer     S/P surgery/chemotherapy  . Peripheral sensory neuropathy due to type 2 diabetes mellitus   . CVA (cerebral  infarction) 06/2012    [name] 07/08/2014  . Kidney stones   . S/P unilateral BKA (below knee amputation) left 07/28/2012  . Stage 3 chronic kidney disease due to type 2 diabetes mellitus 07/08/2014  . Ischemic cardiomyopathy   . Cervical spinal stenosis   . Coronary artery disease   . Colon cancer   . Heart murmur   . Peripheral vascular disease     involving both arms/notes 07/08/2014  . Atherosclerosis of arteries of extremities 07/08/2014  . Type 2 diabetes mellitus with vascular disease 03/14/2009  . Pleural effusion 06/2012    [name] 07/08/2014  . CHF (congestive heart failure)     systolic/notes 9/52/8413  . Myocardial infarction     evidence of a previous old anterior MI on his EKG /notes 07/08/2014    Past Surgical History  Procedure Laterality Date  . 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 Ileostomy closure  10/31/2009  . Portacath placement  08/08/2008  . Amputation  06/30/2012    Procedure: AMPUTATION BELOW KNEE;  Surgeon: Elam Dutch, MD;  Location: [place];  Service: Vascular;  Laterality: Left;  . Intraoperative arteriogram  06/30/2012    Procedure: INTRA OPERATIVE ARTERIOGRAM;  Surgeon: Elam Dutch, MD;  Location: [place];  Service: Vascular;  Laterality: Right;  . Aortogram  06/30/2012    Procedure: AORTOGRAM;  Surgeon:  Elam Dutch, MD;  Location[facility] OR;  Service: Vascular;  Laterality: Right;  Right upper extremity arch   . Tee without cardioversion  07/05/2012    Procedure: TRANSESOPHAGEAL ECHOCARDIOGRAM (TEE);  Surgeon: Thayer Headings, MD;  Location: [place];  Service: Cardiovascular;  Laterality: N/A;  . Port-a-cath removal    . Incision and drainage of wound N/A 10/11/2012    Procedure: IRRIGATION AND DEBRIDEMENT OF RIGHT FOREARM AND LEFT ELBOW WITH POSSIBLE SURGICAL PREP WITH PLACEMENT OF ACELL AND [place];  Surgeon: Theodoro Kos, DO;  Location: [place];  Service: Plastics;  Laterality: N/A;  IRRIGATION AND DEBRIDEMENT OF RIGHT FOREARM AND LEFT ELBOW WITH  PLACEMENT OF ACELL AND VAC  . Hip pinning,cannulated Left 09/12/2013    Procedure: CANNULATED HIP PINNING;  Surgeon: Johnn Hai, MD;  Location: [place];  Service: Orthopedics;  Laterality: Left;  . Cardiac catheterization  07/20/2014  . Thoracentesis  06/2012    [name] 07/08/2014  . Left heart catheterization with coronary angiogram N/A 07/21/2014    Procedure: LEFT HEART CATHETERIZATION WITH CORONARY ANGIOGRAM;  Surgeon: Jacolyn Reedy, MD;  Location: [facility] C[facility] LAB;  Service: Cardiovascular;  Laterality: N/A;    Family History  Problem Relation Age of Onset  . Diabetes Mother   . Diabetes Father   . Heart failure Mother   . Heart failure Father   . Stroke Father   . Diabetes Sister   . Diabetes Sister   . Diabetes Brother    Social History:  reports that he quit smoking about 7 weeks ago. His smoking use included Cigars. He started smoking about 2 years ago. He has never used smokeless tobacco. He reports that he does not drink alcohol or use illicit drugs.  Allergies: No Known Allergies  Home medications: Prior to Admission medications   Medication Sig Start Date End Date Taking? Authorizing Provider  aspirin 81 MG tablet Take 81 mg by mouth daily.   Yes Historical Provider, MD  furosemide (LASIX) 40 MG tablet Take 1 tablet by mouth 2 (two) times daily. 06/27/14  Yes Historical Provider, MD  isosorbide mononitrate (IMDUR) 30 MG 24 hr tablet Take 1 tablet by mouth daily. 07/01/14  Yes Historical Provider, MD  acetaminophen (TYLENOL) 325 MG tablet Take 2 tablets (650 mg total) by mouth every 6 (six) hours as needed for mild pain (or Fever >/= 101). Patient not taking: Reported on 07/08/2014 09/14/13   Delfina Redwood, MD  bisacodyl (DULCOLAX) 10 MG suppository Place 1 suppository (10 mg total) rectally as needed for moderate constipation. Patient not taking: Reported on 07/01/2014 09/14/13   Delfina Redwood, MD  docusate sodium (COLACE) 100 MG capsule Take 1 capsule (100 mg total) by  mouth 2 (two) times daily as needed for mild constipation. Patient not taking: Reported on 07/28/2014 09/12/13   Johnn Hai, MD  enoxaparin (LOVENOX) 40 MG/0.4ML injection Inject 0.4 mLs (40 mg total) into the skin daily. For 1 month Patient not taking: Reported on 07/27/2014 09/14/13   Delfina Redwood, MD  HYDROcodone-acetaminophen (NORCO) 7.5-325 MG per tablet Take 1-2 tablets by mouth every 4 (four) hours as needed for moderate pain. Patient not taking: Reported on 07/26/2014 09/12/13   Johnn Hai, MD  ondansetron (ZOFRAN) 4 MG tablet Take 1 tablet (4 mg total) by mouth every 6 (six) hours as needed for nausea. Patient not taking: Reported on 07/13/2014 09/14/13   Delfina Redwood, MD  senna (SENOKOT) 8.6 MG TABS tablet Take 2  tablets (17.2 mg total) by mouth daily as needed for mild constipation. Patient not taking: Reported on 07/16/2014 09/14/13   Delfina Redwood, MD    Inpatient medications: . antiseptic oral rinse  15 mL Mouth Rinse BID  . aspirin EC  81 mg Oral Daily  . atorvastatin  80 mg Oral q1800  . furosemide  80 mg Intravenous Once  . insulin aspart  0-9 Units Subcutaneous TID WC  . isosorbide mononitrate  30 mg Oral Daily  . linagliptin  5 mg Oral Daily  . metoCLOPramide  5 mg Oral TID AC  . ticagrelor  90 mg Oral BID   Infusions: . sodium chloride Stopped (07/17/14 1545)  . sodium chloride 10 mL/hr at 07/17/14 2127  . amiodarone 30 mg/hr (07/17/14 2127)  . heparin 950 Units/hr (07/18/14 0810)  . milrinone 0.375 mcg/kg/min (07/18/14 0045)    Review of Systems Pt very flat, when asked specific questions "I don't want to talk about that" but does endorse nausea with vomiting, SOB, no CPP at present, no diarrhea and otherwise difficult to obtain   Physical Exam:  BP 110/41 mmHg  Pulse 105  Temp(Src) 97.7 F (36.5 C) (Oral)  Resp 21  Ht 5\' 6"  (1.676 m)  Wt 68.8 kg (151 lb 10.8 oz)  BMI 24.49 kg/m2  SpO2 96%  Gen: Pale appearing somewhat somnolent white  male VS as noted - BP cuff on right leg Skin: with scarring right arm from previous infection (remote) Hands cool Line in right neck (IJ swan sheath) No definite bruits in the neck. Chest: Ant fairly clear but BS diminished Heart: Tachy S1S2 2/6 murmur systolic no diastolic Abdomen: soft, scars from previous surgery. Not distended Ext: Left BKA 1+ RLE edema and trace-1+ post thigh edema Neuro: Flat, lethargic, oriented No definite asterixus but not fully cooperative to testing Heme/Lymph: no bruising or LAN   Recent Labs Lab 07/12/14 2322 07/13/14 0530 07/14/14 0448 07/21/2014 0234 07/16/14 0555 07/17/14 0445 07/18/14 0202  NA 134* 135 133* 131* 132* 131* 128*  K 4.0 3.8 3.2* 3.8 3.7 3.6 3.5  CL 98 98 97 98 100 100 95*  CO2 23 23 24 23  18* 21 18*  GLUCOSE 216* 223* 160* 132* 148* 136* 154*  BUN 39* 42* 48* 47* 51* 58* 65*  CREATININE 1.93* 2.12* 2.41* 2.46* 2.98* 3.68* 4.78*  CALCIUM 8.4 8.3* 8.1* 8.0* 8.0* 8.1* 8.0*    Recent Labs Lab 07/16/14 0845 07/17/14 0440 07/18/14 0220  WBC 13.3* 10.0 12.0*  HGB 12.3* 11.2* 11.5*  HCT 36.0* 33.0* 33.6*  MCV 80.7 79.7 81.0  PLT 151 130* 149*    Recent Labs Lab 07/17/14 0815 07/17/14 1241 07/17/14 1807 07/17/14 2133 07/18/14 0904  GLUCAP 126* 158* 159* 149* 154*    Xrays/Other Studies: US Renal  07/17/2014   CLINICAL DATA:  Initial encounter for renal failure  EXAM: RENAL/URINARY TRACT ULTRASOUND COMPLETE  COMPARISON:  None.  FINDINGS: Right Kidney:  Length: 10.5 cm. Echogenicity within normal limits. No mass or hydronephrosis visualized.  Left Kidney:  Length: 10.7 cm. Echogenicity within normal limits. No mass or hydronephrosis visualized.  Bladder:  Appears normal for degree of bladder distention.  Note:  Large bilateral pleural effusions evident.  IMPRESSION: No evidence for hydronephrosis.  Large bilateral pleural effusions.   Electronically Signed   By: Misty Stanley M.D.   On: 07/17/2014 16:50   Dg Chest Port 1  View  07/18/2014   CLINICAL DATA:  Shortness of breath.  EXAM:  PORTABLE CHEST - 1 VIEW  COMPARISON:  05/13/2015.  FINDINGS: Interval removal of Swan-Ganz catheter. Right IJ sheath noted in good anatomic position. Mediastinum and hilar structures normal. Cardiomegaly with bilateral pulmonary alveolar infiltrates and pleural effusions persist with congestive heart failure noted. Similar findings on prior exam.  IMPRESSION: 1. Congestive heart failure pulmonary edema and bilateral pleural effusions. No change from prior exam. 2. Interval removal of Swan-Ganz catheter. Right IJ sheath in stable position .   Electronically Signed   By: Marcello Moores  Register   On: 07/18/2014 08:09   Dg Abd Portable 1v  07/17/2014   CLINICAL DATA:  continued nausea, pt was [facility] on 07/12/2014 with dyspnea, chest pain, and abnormal EKG. Pt with acute on chronic heart failure. Pt with severe multivessel CAD. He is being evaluated by cardiology for high risk PCI placement. 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  EXAM: PORTABLE ABDOMEN - 1 VIEW  COMPARISON:  07/14/2014  FINDINGS: Several gas distended small bowel loops in the mid abdomen, decreased in degree of distension since previous exam. Distal colon and rectum are nondilated. Pelvic vascular calcifications. Fixation hardware across the left femoral neck partially visualized. Spurring from bilateral acetabula.  IMPRESSION: 1. Slight improvement in mid abdominal gaseous bowel distention.   Electronically Signed   By: Arne Cleveland M.D.   On: 07/17/2014 13:28   Background 64 yo WM with DM, severe ASVD with left main and 3vessel CAD, systolic heart failure with low EF who has developed symptomatic oliguric AKI with mild metabolic acidosis in the setting of IV contrast and cardiorenal  syndrome/cardiogenic shock. (Baseline CKD 3 with creatinine prior to cath 1.93). Requiring high dose inotropes and in need of another contrast procedure at some point for his left main disease. Asked to see for provision of CRRT.    AKI on CKD3/4 - contrast + cardiorenal syndrome from low EF. (Baseline CKD likely DM/nephrosclerosis - most urines over past7 years with dipstick proteinuria Initiate CRRT 25-50 ml/hour net neg as BP allows. All 4K fluids. Overall prognosis not clear though looks rather grim. However will provide RRT until we see how cardiac issues will play out.  Would be very poor candidate for long term HD if there is no renal recovery.  Ischemic cardiomyopathy with LM and 3V disease, low EF - per Dr. Jeffie Pollock. Overall prognosis not clear but looks rather grim.   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   Jamal Maes,  MD [facility] [phone] pager 07/18/2014, 11:05 AM

## 2014-07-18 NOTE — Progress Notes (Signed)
ANTICOAGULATION CONSULT NOTE - Follow Up Consult  Pharmacy Consult for Heparin Indication: atrial fibrillation  No Known Allergies  Patient Measurements: Height: 5\' 6"  (167.6 cm) Weight: 151 lb 10.8 oz (68.8 kg) IBW/kg (Calculated) : 63.8 Heparin Dosing Weight: 66 kg  Vital Signs: Temp: 95.8 F (35.4 C) (01/21 1931) Temp Source: Oral (01/21 1931) BP: 100/58 mmHg (01/21 2033) Pulse Rate: 103 (01/21 2033)  Labs:  Recent Labs  07/16/14 0845  07/17/14 0440 07/17/14 0445 07/17/14 1417 07/18/14 0202 07/18/14 0220 07/18/14 1600  HGB 12.3*  --  11.2*  --   --   --  11.5*  --   HCT 36.0*  --  33.0*  --   --   --  33.6*  --   PLT 151  --  130*  --   --   --  149*  --   HEPARINUNFRC  --   < > 0.27*  --  0.35  --  0.64  --   CREATININE  --   --   --  3.68*  --  4.78*  --  5.19*  < > = values in this interval not displayed.  Estimated Creatinine Clearance: 13.3 mL/min (by C-G formula based on Cr of 5.19).   Assessment: RN called pharmacist tonight @ approximately 18:55 to report that the IV heparin infusion had been off since 10:15 this AM for hemodialysis catheter insertion procedure for CVVH. HD catheter placed today 2:50pm for CVVH. But RN did not restart the heparin gtt. CVVH initiated at 15:55. Heparin 1000 units per CRRT and 450 units/hr per CRRT.   Heparin IV infusion was previously infusing at 950 units/hr for atrial fibrillation in this 64 yo male. RN reported that there was no bleeding noted.   Goal of Therapy:  Heparin level 0.3-0.7 units/ml Monitor platelets by anticoagulation protocol: Yes   Plan:  Restarted heparin tonight with 2500 unit bolus and drip at 950 units/hr. Check 6 hour heparin level Daily heparin level and CBC in am.  Nicole Cella, RPh Clinical Pharmacist Pager: [phone]  07/18/2014 9:01 PM

## 2014-07-18 NOTE — Procedures (Signed)
Hemodilaysis Catheter Insertion Procedure Note BERNELL SIGAL [id number] 1950/10/09  Procedure: Insertion of Central Venous Catheter Indications: CVVH  Procedure Details Consent: Risks of procedure as well as the alternatives and risks of each were explained to the (patient/caregiver).  Consent for procedure obtained. Time Out: Verified patient identification, verified procedure, site/side was marked, verified correct patient position, special equipment/implants available, medications/allergies/relevent history reviewed, required imaging and test results available.  Performed  Maximum sterile technique was used including antiseptics, cap, gloves, gown, hand hygiene, mask and sheet. Skin prep: Chlorhexidine; local anesthetic administered A antimicrobial bonded/coated triple lumen HD catheter was placed in the left internal jugular vein using the Seldinger technique.  Evaluation Blood flow good Complications: No apparent complications Patient did tolerate procedure well. Chest X-ray ordered to verify placement.  CXR: pending.   Performed under direct MD supervision.  Performed using ultrasound guidance.  Wire visualized in vessel under ultrasound.   Heparin gtt held x 3 hours prior to procedure.   Placed by Dr. Jessee Avers PGY-3  Baltazar Apo, MD, PhD 03-Aug-2014, 3:29 PM [facility] [phone] or if no answer [phone]

## 2014-07-18 NOTE — Progress Notes (Signed)
Pt had episode of vomiting after coughing. Dark brown noted in emesis. AM labs drawn.

## 2014-07-18 NOTE — Progress Notes (Signed)
Advanced Heart Failure Rounding Note   Subjective:    Yesterday renal function continued to decline. Had renal ultrasound with no hydronephrosis but with pleural effusions. KUB improved gas pattern. Weight trending up. Poor urine output despite 80 mg IV lasix.   N/V over night. Feels awful. More confused. Urine output about 200cc x 24 hours (has been off lasix). No CP or dyspnea  Renal u/s. Normal kidney size. No hydro  A tach/AFL converted to NSR on amio   Creatinine 2.46>2.98>3.68 >4.78    Objective:   Weight Range:  Vital Signs:   Temp:  [97.2 F (36.2 C)-98.1 F (36.7 C)] 97.7 F (36.5 C) (01/21 0400) Pulse Rate:  [101-109] 106 (01/21 0600) Resp:  [19-33] 19 (01/21 0600) BP: (104-153)/(33-69) 106/40 mmHg (01/21 0600) SpO2:  [92 %-97 %] 96 % (01/21 0600) Weight:  [151 lb 10.8 oz (68.8 kg)] 151 lb 10.8 oz (68.8 kg) (01/21 0420) Last BM Date: 07/16/14  Weight change: Filed Weights   07/16/14 0500 07/17/14 0500 07/18/14 0420  Weight: 145 lb 8.1 oz (66 kg) 150 lb 5.7 oz (68.2 kg) 151 lb 10.8 oz (68.8 kg)    Intake/Output:   Intake/Output Summary (Last 24 hours) at 07/18/14 0703 Last data filed at 07/18/14 0600  Gross per 24 hour  Intake 1503.6 ml  Output    100 ml  Net 1403.6 ml     Physical Exam: General: Chronically ill appearing. No dyspnea. Lying in bed.  HEENT: normal x for poor dentition Neck: supple. RIJ swan sheath.   Carotids 2+ bilat; no bruits. No lymphadenopathy or thryomegaly appreciated. Cor: PMI nondisplaced. Regular tachy + PVCs. + 2/6 MR Lungs: decreased throughout.  Abdomen: soft, nontender, nondistended. No hepatosplenomegaly. No bruits or masses. Sluggish bowel sounds. Extremities: no cyanosis, clubbing, rash, 1+ edema on R. + L BKA  Neuro:slightly lethargic cranial nerves grossly intact. moves all 4 extremities w/o difficulty. Affect pleasant.  Telemetry: Sinus tach ~ 100  Labs: Basic Metabolic Panel:  Recent Labs Lab 07/14/14 0448  07/28/2014 0234 07/16/14 0555 07/17/14 0445 07/18/14 0202  NA 133* 131* 132* 131* 128*  K 3.2* 3.8 3.7 3.6 3.5  CL 97 98 100 100 95*  CO2 24 23 18* 21 18*  GLUCOSE 160* 132* 148* 136* 154*  BUN 48* 47* 51* 58* 65*  CREATININE 2.41* 2.46* 2.98* 3.68* 4.78*  CALCIUM 8.1* 8.0* 8.0* 8.1* 8.0*  MG  --  2.0  --   --   --     Liver Function Tests: No results for input(s): AST, ALT, ALKPHOS, BILITOT, PROT, ALBUMIN in the last 168 hours. No results for input(s): LIPASE, AMYLASE in the last 168 hours. No results for input(s): AMMONIA in the last 168 hours.  CBC:  Recent Labs Lab 07/16/14 0845 07/17/14 0440 07/18/14 0220  WBC 13.3* 10.0 12.0*  HGB 12.3* 11.2* 11.5*  HCT 36.0* 33.0* 33.6*  MCV 80.7 79.7 81.0  PLT 151 130* 149*    Cardiac Enzymes: No results for input(s): CKTOTAL, CKMB, CKMBINDEX, TROPONINI in the last 168 hours.  BNP: BNP (last 3 results) No results for input(s): PROBNP in the last 8760 hours.   Other results:    Imaging: US Renal  07/17/2014   CLINICAL DATA:  Initial encounter for renal failure  EXAM: RENAL/URINARY TRACT ULTRASOUND COMPLETE  COMPARISON:  None.  FINDINGS: Right Kidney:  Length: 10.5 cm. Echogenicity within normal limits. No mass or hydronephrosis visualized.  Left Kidney:  Length: 10.7 cm. Echogenicity within normal limits. No  mass or hydronephrosis visualized.  Bladder:  Appears normal for degree of bladder distention.  Note:  Large bilateral pleural effusions evident.  IMPRESSION: No evidence for hydronephrosis.  Large bilateral pleural effusions.   Electronically Signed   By: Misty Stanley M.D.   On: 07/17/2014 16:50   Dg Abd Portable 1v  07/17/2014   CLINICAL DATA:  continued nausea, pt was admitted to [facility] on 07/26/2014 with dyspnea, chest pain, and abnormal EKG. Pt with acute on chronic heart failure. Pt with severe multivessel CAD. He is being evaluated by cardiology for high risk PCI placement. Pt with significant PMHx of colon CA (s/p  surgery and chemo), peripheral neuropathy secondary to DM, CVA, s/p L BKA 06/2012, ischemic cardiomyopathy, cervical spine stenosis, CAD, PVD (bil arms and bil legs), CHF, MI, and left hip pinning.  EXAM: PORTABLE ABDOMEN - 1 VIEW  COMPARISON:  07/14/2014  FINDINGS: Several gas distended small bowel loops in the mid abdomen, decreased in degree of distension since previous exam. Distal colon and rectum are nondilated. Pelvic vascular calcifications. Fixation hardware across the left femoral neck partially visualized. Spurring from bilateral acetabula.  IMPRESSION: 1. Slight improvement in mid abdominal gaseous bowel distention.   Electronically Signed   By: Arne Cleveland M.D.   On: 07/17/2014 13:28     Medications:     Scheduled Medications: . antiseptic oral rinse  15 mL Mouth Rinse BID  . aspirin EC  81 mg Oral Daily  . atorvastatin  80 mg Oral q1800  . insulin aspart  0-9 Units Subcutaneous TID WC  . isosorbide mononitrate  30 mg Oral Daily  . linagliptin  5 mg Oral Daily  . metoCLOPramide  5 mg Oral TID AC  . ticagrelor  90 mg Oral BID    Infusions: . sodium chloride Stopped (07/17/14 1545)  . sodium chloride 10 mL/hr at 07/17/14 2127  . amiodarone 30 mg/hr (07/17/14 2127)  . heparin 1,000 Units/hr (07/17/14 1945)  . milrinone 0.375 mcg/kg/min (07/18/14 0045)    PRN Medications: acetaminophen, morphine injection, ondansetron (ZOFRAN) IV, promethazine   Assessment:   1. Acute systolic heart failure due to iCM EF 20% -> cardiogenic shock 2. 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 DM2 4. PAD s/p L BKA. Occluded bilateral axillary arteries 5. Acute renal failure 6. Severe protein-calorie malnutrition pre-albumin 13.9 7. NSVT 8. Hyperkalemia 9. Colon CA s/p colectomy 2009 and adjuvant chemo 10. Atrial flutter/a tach 11. Bilateral Pleural Effusions.  12. Hyponatremia  Plan/Discussion:    Hemodynamics and ventricular ectopy much improved on current regimen. Volume  status creeping up. Will give lasix 80IV today. Can remove swan (keep sheath)  Unfortunately creatinine worse again likely due to previous shock and component of CIN. No b-blocker or ACE now due to shock/AKI. Renal U/S ok.  Will need nephrology.   Check CXR. Large effusions reported on renal U/S. May need thoracentesis.   Atrial tach and VT much improved with amio.   Peri-prandial nausea persists despite restoration of CO. 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 KUB 07/14/14 suggesive of ileus. Gas pattern improved on KUB but with increased N/V overnight.Sluggish bowel sounds.  Consider GI consult.    Probable high-risk PCI of LM/LAD/LCX when /if renal function improves.   Continue ASA/statin/Brilinta.  The patient is critically ill with multiple organ systems failure and requires high complexity decision making for assessment and support, frequent evaluation and titration of therapies, application of advanced monitoring technologies and extensive interpretation of multiple databases.   Critical  Care Time devoted to patient care services described in this note is 35 Minutes.  Length of Stay: 8   CLEGG,AMY NP-C  07/18/2014, 7:03 AM  Advanced Heart Failure Team Pager [phone] (M-F; 7a - 4p)  Please contact [facility] for night-coverage after hours (4p -7a ) and weekends on [email or web address]   Patient seen and examined with Darrick Grinder, NP. We discussed all aspects of the encounter. I agree with the assessment and plan as stated above.   He is more uremic today. Creatinine getting worse despite restoration of cardiac output for past week. Suspect ATN and CIN. Have d/w Renal will plan CVVD. CCM requested to place trialysis catheter.   Prognosis becoming more guarded.   CXR with small effusions. No role for thora at this point.   The patient is critically ill with multiple organ systems failure and requires high complexity decision making for assessment and support, frequent evaluation and  titration of therapies, application of advanced monitoring technologies and extensive interpretation of multiple databases.   Critical Care Time devoted to patient care services described in this note is 45 Minutes.  Daniel Bensimhon,MD 9:59 AM

## 2014-07-18 NOTE — Progress Notes (Signed)
ANTICOAGULATION CONSULT NOTE - Follow Up Consult  Pharmacy Consult for Heparin Indication: atrial fibrillation  No Known Allergies  Patient Measurements: Height: 5\' 6"  (167.6 cm) Weight: 151 lb 10.8 oz (68.8 kg) IBW/kg (Calculated) : 63.8 Heparin Dosing Weight: 66 kg  Vital Signs: Temp: 97.7 F (36.5 C) (01/21 0400) Temp Source: Oral (01/21 0400) BP: 106/40 mmHg (01/21 0600) Pulse Rate: 106 (01/21 0600)  Labs:  Recent Labs  07/16/14 0555  07/16/14 0845  07/17/14 0440 07/17/14 0445 07/17/14 1417 07/18/14 0202 07/18/14 0220  HGB  --   < > 12.3*  --  11.2*  --   --   --  11.5*  HCT  --   --  36.0*  --  33.0*  --   --   --  33.6*  PLT  --   --  151  --  130*  --   --   --  149*  HEPARINUNFRC  --   --   --   < > 0.27*  --  0.35  --  0.64  CREATININE 2.98*  --   --   --   --  3.68*  --  4.78*  --   < > = values in this interval not displayed.  Estimated Creatinine Clearance: 14.5 mL/min (by C-G formula based on Cr of 4.78).  Assessment: 64 yo male on IV heparin for atrial tachycardia of unknown onset.  Heparin level is therapeutic today (0.64) on 1000 units/hr, but level seems to be trending up.  No bleeding or complications noted.  Goal of Therapy:  Heparin level 0.3-0.7 units/ml Monitor platelets by anticoagulation protocol: Yes   Plan:   Decrease heparin drip just a little to 950 units/hr.  Next heparin level and CBC in am.  Uvaldo Rising, BCPS  Clinical Pharmacist Pager [phone]  07/18/2014 7:21 AM

## 2014-07-19 LAB — CBC WITH DIFFERENTIAL/PLATELET
BASOS PCT: 0 % (ref 0–1)
Basophils Absolute: 0 10*3/uL (ref 0.0–0.1)
Eosinophils Absolute: 0 10*3/uL (ref 0.0–0.7)
Eosinophils Relative: 0 % (ref 0–5)
HCT: 30 % — ABNORMAL LOW (ref 39.0–52.0)
Hemoglobin: 10.3 g/dL — ABNORMAL LOW (ref 13.0–17.0)
Lymphocytes Relative: 11 % — ABNORMAL LOW (ref 12–46)
Lymphs Abs: 1.5 10*3/uL (ref 0.7–4.0)
MCH: 27.2 pg (ref 26.0–34.0)
MCHC: 34.3 g/dL (ref 30.0–36.0)
MCV: 79.4 fL (ref 78.0–100.0)
MONO ABS: 1.4 10*3/uL — AB (ref 0.1–1.0)
MONOS PCT: 10 % (ref 3–12)
Neutro Abs: 10.6 10*3/uL — ABNORMAL HIGH (ref 1.7–7.7)
Neutrophils Relative %: 79 % — ABNORMAL HIGH (ref 43–77)
Platelets: 172 10*3/uL (ref 150–400)
RBC: 3.78 MIL/uL — ABNORMAL LOW (ref 4.22–5.81)
RDW: 14.1 % (ref 11.5–15.5)
Smear Review: ADEQUATE
WBC: 13.5 10*3/uL — AB (ref 4.0–10.5)

## 2014-07-19 LAB — CARBOXYHEMOGLOBIN
Carboxyhemoglobin: 1.2 % (ref 0.5–1.5)
METHEMOGLOBIN: 1 % (ref 0.0–1.5)
O2 SAT: 64.6 %
Total hemoglobin: 10.8 g/dL — ABNORMAL LOW (ref 13.5–18.0)

## 2014-07-19 LAB — TYPE AND SCREEN
ABO/RH(D): O POS
Antibody Screen: NEGATIVE

## 2014-07-19 LAB — RENAL FUNCTION PANEL
ANION GAP: 14 (ref 5–15)
Albumin: 2.7 g/dL — ABNORMAL LOW (ref 3.5–5.2)
Albumin: 2.6 g/dL — ABNORMAL LOW (ref 3.5–5.2)
Anion gap: 10 (ref 5–15)
BUN: 43 mg/dL — ABNORMAL HIGH (ref 6–23)
BUN: 29 mg/dL — AB (ref 6–23)
CALCIUM: 7.7 mg/dL — AB (ref 8.4–10.5)
CHLORIDE: 99 meq/L (ref 96–112)
CO2: 21 mmol/L (ref 19–32)
CO2: 25 mmol/L (ref 19–32)
Calcium: 7.8 mg/dL — ABNORMAL LOW (ref 8.4–10.5)
Chloride: 98 mmol/L (ref 96–112)
Creatinine, Ser: 3.75 mg/dL — ABNORMAL HIGH (ref 0.50–1.35)
Creatinine, Ser: 2.96 mg/dL — ABNORMAL HIGH (ref 0.50–1.35)
GFR calc Af Amer: 18 mL/min — ABNORMAL LOW (ref >90)
GFR calc Af Amer: 25 mL/min — ABNORMAL LOW (ref >90)
GFR calc non Af Amer: 16 mL/min — ABNORMAL LOW (ref >90)
GFR calc non Af Amer: 21 mL/min — ABNORMAL LOW (ref >90)
GLUCOSE: 139 mg/dL — AB (ref 70–99)
Glucose, Bld: 111 mg/dL — ABNORMAL HIGH (ref 70–99)
POTASSIUM: 4 mmol/L (ref 3.5–5.1)
Phosphorus: 3.6 mg/dL (ref 2.3–4.6)
Phosphorus: 3 mg/dL (ref 2.3–4.6)
Potassium: 4 mmol/L (ref 3.5–5.1)
Sodium: 134 mmol/L — ABNORMAL LOW (ref 135–145)
Sodium: 133 mmol/L — ABNORMAL LOW (ref 135–145)

## 2014-07-19 LAB — POCT ACTIVATED CLOTTING TIME
ACTIVATED CLOTTING TIME: 214 s
ACTIVATED CLOTTING TIME: 220 s
ACTIVATED CLOTTING TIME: 220 s
Activated Clotting Time: 220 seconds
Activated Clotting Time: 226 seconds
Activated Clotting Time: 227 seconds
Activated Clotting Time: 220 seconds
Activated Clotting Time: 220 seconds

## 2014-07-19 LAB — CBC
HEMATOCRIT: 31 % — AB (ref 39.0–52.0)
HEMOGLOBIN: 10.5 g/dL — AB (ref 13.0–17.0)
MCH: 26.8 pg (ref 26.0–34.0)
MCHC: 33.9 g/dL (ref 30.0–36.0)
MCV: 79.1 fL (ref 78.0–100.0)
PLATELETS: 138 10*3/uL — AB (ref 150–400)
RBC: 3.92 MIL/uL — ABNORMAL LOW (ref 4.22–5.81)
RDW: 13.9 % (ref 11.5–15.5)
WBC: 10.2 10*3/uL (ref 4.0–10.5)

## 2014-07-19 LAB — GLUCOSE, CAPILLARY
GLUCOSE-CAPILLARY: 130 mg/dL — AB (ref 70–99)
Glucose-Capillary: 121 mg/dL — ABNORMAL HIGH (ref 70–99)
Glucose-Capillary: 138 mg/dL — ABNORMAL HIGH (ref 70–99)
Glucose-Capillary: 135 mg/dL — ABNORMAL HIGH (ref 70–99)

## 2014-07-19 LAB — HEPARIN LEVEL (UNFRACTIONATED)
HEPARIN UNFRACTIONATED: 0.85 [IU]/mL — AB (ref 0.30–0.70)
Heparin Unfractionated: 0.89 IU/mL — ABNORMAL HIGH (ref 0.30–0.70)

## 2014-07-19 LAB — APTT: aPTT: >200 seconds (ref 24–37)

## 2014-07-19 LAB — MAGNESIUM: MAGNESIUM: 2.2 mg/dL (ref 1.5–2.5)

## 2014-07-19 MED ORDER — HEPARIN (PORCINE) IN NACL 100-0.45 UNIT/ML-% IJ SOLN
1250.0000 [IU]/h | INTRAMUSCULAR | Status: DC
  Administered 2014-07-20: 850 [IU]/h via INTRAVENOUS
  Filled 2014-07-19 (×5): qty 250

## 2014-07-19 MED ORDER — HEPARIN (PORCINE) IN NACL 100-0.45 UNIT/ML-% IJ SOLN
650.0000 [IU]/h | INTRAMUSCULAR | Status: DC
  Filled 2014-07-19: qty 250

## 2014-07-19 MED ORDER — NOREPINEPHRINE BITARTRATE 1 MG/ML IV SOLN
0.0000 ug/min | INTRAVENOUS | Status: DC
  Administered 2014-07-19 – 2014-07-20 (×2): 5 ug/min via INTRAVENOUS
  Administered 2014-07-21 – 2014-07-22 (×2): 3 ug/min via INTRAVENOUS
  Filled 2014-07-19 (×4): qty 4

## 2014-07-19 MED ORDER — "THROMBI-PAD 3""X3"" EX PADS"
1.0000 | MEDICATED_PAD | Freq: Once | CUTANEOUS | Status: AC
  Administered 2014-07-19: 1 via TOPICAL
  Filled 2014-07-19: qty 1

## 2014-07-19 MED ORDER — SODIUM CHLORIDE 0.9 % IJ SOLN
10.0000 mL | Freq: Two times a day (BID) | INTRAMUSCULAR | Status: DC
  Administered 2014-07-19 – 2014-07-22 (×5): 10 mL

## 2014-07-19 MED ORDER — LIDOCAINE HCL (PF) 1 % IJ SOLN
INTRAMUSCULAR | Status: AC
  Filled 2014-07-19: qty 30

## 2014-07-19 MED ORDER — SODIUM CHLORIDE 0.9 % IJ SOLN: 10.0000 mL | INTRAMUSCULAR | Status: DC | PRN

## 2014-07-19 NOTE — Progress Notes (Signed)
ANTICOAGULATION CONSULT NOTE - Follow Up Consult  Pharmacy Consult for Heparin Indication: atrial fibrillation  No Known Allergies  Patient Measurements: Height: 5\' 6"  (167.6 cm) Weight: 149 lb 11.1 oz (67.9 kg) IBW/kg (Calculated) : 63.8 Heparin Dosing Weight: 66 kg  Vital Signs: Temp: 97.8 F (36.6 C) (01/22 1600) Temp Source: Oral (01/22 1600) BP: 103/11 mmHg (01/22 1700) Pulse Rate: 108 (01/22 1700)  Labs:  Recent Labs  07/18/14 0220 07/18/14 1600 07/19/14 0100 07/19/14 0420 07/19/14 0745 07/19/14 1500  HGB 11.5*  --   --  10.5*  --  10.3*  HCT 33.6*  --   --  31.0*  --  30.0*  PLT 149*  --   --  138*  --  172  APTT  --   --   --  >200*  --   --   HEPARINUNFRC 0.64  --  0.89*  --  0.85*  --   CREATININE  --  5.19*  --  3.75*  --  2.96*    Estimated Creatinine Clearance: 23.4 mL/min (by C-G formula based on Cr of 2.96).  Assessment: 64 yo male with afib and heparin has been on hold for bleeding at the HD cath site. Site is now s/p repair and per MD, may restart heparin at 8pm tonight. Last heparin rate was 650 units/hr.  Goal of Therapy:  Heparin level 0.3-0.7 units/ml Monitor platelets by anticoagulation protocol: Yes   Plan:  -Resume heparin at 650 units/hr at 8pm -Heparin level and CBC in am  Hildred Laser, Pharm D 07/19/2014 5:51 PM

## 2014-07-19 NOTE — Progress Notes (Signed)
ANTICOAGULATION CONSULT NOTE - Follow Up Consult  Pharmacy Consult for Heparin Indication: atrial fibrillation  No Known Allergies  Patient Measurements: Height: 5\' 6"  (167.6 cm) Weight: 149 lb 11.1 oz (67.9 kg) IBW/kg (Calculated) : 63.8 Heparin Dosing Weight: 66 kg  Vital Signs: Temp: 97.6 F (36.4 C) (01/22 0000) Temp Source: Oral (01/22 0000) BP: 93/37 mmHg (01/22 0802) Pulse Rate: 101 (01/22 0802)  Labs:  Recent Labs  07/17/14 0440  07/18/14 0202 07/18/14 0220 07/18/14 1600 07/19/14 0100 07/19/14 0420 07/19/14 0745  HGB 11.2*  --   --  11.5*  --   --  10.5*  --   HCT 33.0*  --   --  33.6*  --   --  31.0*  --   PLT 130*  --   --  149*  --   --  138*  --   APTT  --   --   --   --   --   --  >200*  --   HEPARINUNFRC 0.27*  < >  --  0.64  --  0.89*  --  0.85*  CREATININE  --   < > 4.78*  --  5.19*  --  3.75*  --   < > = values in this interval not displayed.  Estimated Creatinine Clearance: 18.4 mL/min (by C-G formula based on Cr of 3.75).  Assessment: 64 yo male on IV heparin for atrial tachycardia of unknown onset.   Heparin was off yesterday afternoon for HD catheter placed.  HL upon restart has been > 0.8 x2.  Will hold for 1hr and restart at lower rate.  CBC stable.  No bleeding noted.  Goal of Therapy:  Heparin level 0.3-0.7 units/ml Monitor platelets by anticoagulation protocol: Yes   Plan:  Hold heparin for 1hr Restart Heparin drip at 1000 rate 650 uts/hr  Check HL 8hr after restart Daily HL, cbc  Bonnita Nasuti Pharm.D. CPP, BCPS Clinical Pharmacist [phone] 07/19/2014 8:59 AM

## 2014-07-19 NOTE — Progress Notes (Signed)
PT Cancellation Note  Patient Details Name: Tyler Erickson MRN: [id number] DOB: March 06, 1951   Cancelled Treatment:    Reason Eval/Treat Not Completed: Medical issues which prohibited therapy (Pt on CRRT.)   [facility] 07/19/2014, 8:54 AM

## 2014-07-19 NOTE — Progress Notes (Addendum)
CRITICAL VALUE ALERT  Critical value received: ptt>200  Date of notification:  07/19/14  Time of notification:  0630  Critical value read back:yes  Nurse who received alert: Deloris Ping   MD notified (1st page):  [place]  Time of first page:  0625  MD notified (2nd page):  Time of second page:  Responding MD:  Armstead Peaks  Time MD responded: 0635,order to d/c heparin syringe-pt already on systemic heparin.

## 2014-07-19 NOTE — Progress Notes (Signed)
NUTRITION FOLLOW-UP  Pt meets criteria for SEVERE MALNUTRITION in the context of chronic illness as evidenced by fat and muscle depletion.  INTERVENTION: Continue Magic cup TID with meals, each supplement provides 290 kcal and 9 grams of protein  NUTRITION DIAGNOSIS: Increased nutrient needs related to wound healing as evidenced by estimated needs; ongoing.   Goal: Pt to meet >/= 90% of their estimated nutrition needs; not met.    Monitor:  PO intake, labs, weight trend.  ASSESSMENT: 64 year old diabetic was admitted to the hospital on 1/13 after coronary angiogram to evaluate ischemic cardiomyopathy and class IV heart failure.   Receiving CRRT. PO intake remains poor. Patient is receiving Magic Cup TID with meals. Consuming 25% of meals.  Height: Ht Readings from Last 1 Encounters:  07/14/14 _0  (1.676 m)    Weight: Wt Readings from Last 1 Encounters:  07/19/14 149 lb 11.1 oz (67.9 kg)  Admission weight: 150 lb (68 kg) 1/13   Estimated Nutritional Needs: Kcal: 1700-1900 Protein: 85-100 grams Fluid: >1.7 L/day  Skin: left BKA  Diet Order: Diet heart healthy/carb modified   Intake/Output Summary (Last 24 hours) at 07/19/14 1008 Last data filed at 07/19/14 1000  Gross per 24 hour  Intake 1185.7 ml  Output   1390 ml  Net -204.3 ml    Last BM: 1/19   Labs:   Recent Labs Lab 07/17/2014 0234  07/18/14 0202 07/18/14 1600 07/19/14 0420  NA 131*  < > 128* 129* 134*  K 3.8  < > 3.5 3.9 4.0  CL 98  < > 95* 96 99  CO2 23  < > 18* 18* 21  BUN 47*  < > 65* 68* 43*  CREATININE 2.46*  < > 4.78* 5.19* 3.75*  CALCIUM 8.0*  < > 8.0* 7.8* 7.8*  MG 2.0  --   --   --  2.2  PHOS  --   --   --  5.8* 3.6  GLUCOSE 132*  < > 154* 147* 111*  < > = values in this interval not displayed.  CBG (last 3)   Recent Labs  07/18/14 1204 07/18/14 2123 07/19/14 0742  GLUCAP 162* 105* 130*    Scheduled Meds: . antiseptic oral rinse  15 mL Mouth Rinse BID  . aspirin EC  81  mg Oral Daily  . atorvastatin  80 mg Oral q1800  . insulin aspart  0-9 Units Subcutaneous TID WC  . isosorbide mononitrate  30 mg Oral Daily  . linagliptin  5 mg Oral Daily  . metoCLOPramide  5 mg Oral TID AC  . sodium chloride  10-40 mL Intracatheter Q12H  . ticagrelor  90 mg Oral BID    Continuous Infusions: . sodium chloride Stopped (07/17/14 1545)  . sodium chloride 10 mL/hr at 07/17/14 2127  . amiodarone 30 mg/hr (07/18/14 2233)  . heparin 650 Units/hr (07/19/14 1004)  . milrinone 0.375 mcg/kg/min (07/19/14 0558)  . dialysis replacement fluid (prismasate) 300 mL/hr at 07/18/14 1535  . dialysis replacement fluid (prismasate) 200 mL/hr at 07/18/14 1535  . dialysate (PRISMASATE) 1,500 mL/hr at 07/19/14 [place], RD, LDN, [place] Pager [phone] After Hours Pager [phone]

## 2014-07-19 NOTE — Progress Notes (Signed)
ANTICOAGULATION CONSULT NOTE - Follow Up Consult  Pharmacy Consult for Heparin  Indication: atrial fibrillation/flutter  No Known Allergies  Patient Measurements: Height: 5\' 6"  (167.6 cm) Weight: 151 lb 10.8 oz (68.8 kg) IBW/kg (Calculated) : 63.8  Vital Signs: Temp: 97.4 F (36.3 C) (01/21 2100) Temp Source: Oral (01/21 2100) BP: 99/33 mmHg (01/22 0130) Pulse Rate: 100 (01/22 0130)  Labs:  Recent Labs  07/16/14 0845  07/17/14 0440 07/17/14 0445 07/17/14 1417 07/18/14 0202 07/18/14 0220 07/18/14 1600 07/19/14 0100  HGB 12.3*  --  11.2*  --   --   --  11.5*  --   --   HCT 36.0*  --  33.0*  --   --   --  33.6*  --   --   PLT 151  --  130*  --   --   --  149*  --   --   HEPARINUNFRC  --   < > 0.27*  --  0.35  --  0.64  --  0.89*  CREATININE  --   --   --  3.68*  --  4.78*  --  5.19*  --   < > = values in this interval not displayed.  Estimated Creatinine Clearance: 13.3 mL/min (by C-G formula based on Cr of 5.19).  Assessment: Supra-therapeutic heparin level. Pt is on CRRT with heparin syringe. No current issues per RN.   Goal of Therapy:  Heparin level 0.3-0.7 units/ml Monitor platelets by anticoagulation protocol: Yes   Plan:  -Decrease heparin drip to 800 units/hr -0800 HL -Daily CBC/HL -Monitor for bleeding  Narda Bonds 07/19/2014,1:43 AM

## 2014-07-19 NOTE — Progress Notes (Addendum)
[facility] Rounding Note Subjective:  Much more alert today "I want to get the hell out of here" Re-explained to him what we are doing with the CRRT Had some bleeding around his catheter - systemic heparin reduced and heparin with CRRT off  Objective Vital signs in last 24 hours: Filed Vitals:   07/19/14 0800 07/19/14 0802 07/19/14 0830 07/19/14 0900  BP: 69/52 93/37 91/41  83/19  Pulse: 97 101 103 108  Temp:      TempSrc:      Resp: 22 25 22 25   Height:      Weight:      SpO2: 96% 94% 93% 95%   Physical Exam:  BP 83/19 mmHg  Pulse 108  Temp(Src) 97.8 F (36.6 C) (Oral)  Resp 25  Ht 5\' 6"  (1.676 m)  Wt 67.9 kg (149 lb 11.1 oz)  BMI 24.17 kg/m2  SpO2 95% Gen: Pale appearing Much more awake than yesterday VS as noted - BP cuff on right leg Skin: with scarring right arm from previous infection (remote) Hands cool Line in right neck (IJ swan sheath) and trialysis catheter in left neck oozing Chest: Ant fairly clear but BS diminished Heart: Tachy S1S2 2/6 murmur systolic no diastolic Abdomen: soft, scars from previous surgery. Not distended Ext: Left BKA 1+ RLE edema and trace-1+ post thigh edema Neuro: Oriented X3  Weights: 1/16 64.1 kg 1/18 66.1 kg 1/20 68.2 kg 1/21 68.8 kg  CRRT started 1/22 67.9 kg  Labs: Basic Metabolic Panel:  Recent Labs Lab 07/14/14 0448 07/26/2014 0234 07/16/14 0555 07/17/14 0445 07/18/14 0202 07/18/14 1600 07/19/14 0420  NA 133* 131* 132* 131* 128* 129* 134*  K 3.2* 3.8 3.7 3.6 3.5 3.9 4.0  CL 97 98 100 100 95* 96 99  CO2 24 23 18* 21 18* 18* 21  GLUCOSE 160* 132* 148* 136* 154* 147* 111*  BUN 48* 47* 51* 58* 65* 68* 43*  CREATININE 2.41* 2.46* 2.98* 3.68* 4.78* 5.19* 3.75*  CALCIUM 8.1* 8.0* 8.0* 8.1* 8.0* 7.8* 7.8*  PHOS  --   --   --   --   --  5.8* 3.6    Recent Labs Lab 07/18/14 1600 07/19/14 0420  ALBUMIN 2.6* 2.7*    Recent Labs Lab 07/16/14 0845 07/17/14 0440 07/18/14 0220 07/19/14 0420  WBC  13.3* 10.0 12.0* 10.2  HGB 12.3* 11.2* 11.5* 10.5*  HCT 36.0* 33.0* 33.6* 31.0*  MCV 80.7 79.7 81.0 79.1  PLT 151 130* 149* 138*   Recent Labs Lab 07/17/14 2133 07/18/14 0904 07/18/14 1204 07/18/14 2123 07/19/14 0742  GLUCAP 149* 154* 162* 105* 130*   Studies/Results: US Renal  07/17/2014   CLINICAL DATA:  Initial encounter for renal failure  EXAM: RENAL/URINARY TRACT ULTRASOUND COMPLETE  COMPARISON:  None.  FINDINGS: Right Kidney:  Length: 10.5 cm. Echogenicity within normal limits. No mass or hydronephrosis visualized.  Left Kidney:  Length: 10.7 cm. Echogenicity within normal limits. No mass or hydronephrosis visualized.  Bladder:  Appears normal for degree of bladder distention.  Note:  Large bilateral pleural effusions evident.  IMPRESSION: No evidence for hydronephrosis.  Large bilateral pleural effusions.   Electronically Signed   By: Misty Stanley M.D.   On: 07/17/2014 16:50   Dg Chest Port 1 View  07/18/2014   CLINICAL DATA:  Post left hemodialysis catheter placement  EXAM: PORTABLE CHEST - 1 VIEW  COMPARISON:  07/18/2014  FINDINGS: There is a new left internal jugular central line with tip in the expected  location of the SVC just above the cavoatrial junction. There is no pneumothorax. There are moderate pleural effusions bilaterally. There is left base consolidation which may be worsened from the earlier study.  The right jugular central line appears unchanged from earlier exam.  IMPRESSION: New left internal jugular central line with tip in expected location of the low SVC. No pneumothorax.  Bilateral moderate pleural effusions.  Left base consolidation.   Electronically Signed   By: Andreas Newport M.D.   On: 07/18/2014 15:22   Dg Chest Port 1 View  07/18/2014   CLINICAL DATA:  Shortness of breath.  EXAM: PORTABLE CHEST - 1 VIEW  COMPARISON:  05/13/2015.  FINDINGS: Interval removal of Swan-Ganz catheter. Right IJ sheath noted in good anatomic position. Mediastinum and hilar  structures normal. Cardiomegaly with bilateral pulmonary alveolar infiltrates and pleural effusions persist with congestive heart failure noted. Similar findings on prior exam.  IMPRESSION: 1. Congestive heart failure pulmonary edema and bilateral pleural effusions. No change from prior exam. 2. Interval removal of Swan-Ganz catheter. Right IJ sheath in stable position .   Electronically Signed   By: Marcello Moores  Register   On: 07/18/2014 08:09   Dg Abd Portable 1v  07/17/2014   CLINICAL DATA:  continued nausea, pt was admitted to [facility] on 07/23/2014 with dyspnea, chest pain, and abnormal EKG. Pt with acute on chronic heart failure. Pt with severe multivessel CAD. He is being evaluated by cardiology for high risk PCI placement. 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  EXAM: PORTABLE ABDOMEN - 1 VIEW  COMPARISON:  07/14/2014  FINDINGS: Several gas distended small bowel loops in the mid abdomen, decreased in degree of distension since previous exam. Distal colon and rectum are nondilated. Pelvic vascular calcifications. Fixation hardware across the left femoral neck partially visualized. Spurring from bilateral acetabula.  IMPRESSION: 1. Slight improvement in mid abdominal gaseous bowel distention.   Electronically Signed   By: Arne Cleveland M.D.   On: 07/17/2014 13:28   Medications: . sodium chloride Stopped (07/17/14 1545)  . sodium chloride 10 mL/hr at 07/17/14 2127  . amiodarone 30 mg/hr (07/18/14 2233)  . heparin Stopped (07/19/14 0900)  . milrinone 0.375 mcg/kg/min (07/19/14 0558)  . dialysis replacement fluid (prismasate) 300 mL/hr at 07/18/14 1535  . dialysis replacement fluid (prismasate) 200 mL/hr at 07/18/14 1535  . dialysate (PRISMASATE) 1,500 mL/hr at 07/19/14 0344   . antiseptic oral rinse  15 mL Mouth  Rinse BID  . aspirin EC  81 mg Oral Daily  . atorvastatin  80 mg Oral q1800  . insulin aspart  0-9 Units Subcutaneous TID WC  . isosorbide mononitrate  30 mg Oral Daily  . linagliptin  5 mg Oral Daily  . metoCLOPramide  5 mg Oral TID AC  . sodium chloride  10-40 mL Intracatheter Q12H  . ticagrelor  90 mg Oral BID    I  have reviewed scheduled and prn medications.  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 (Baseline CKD 3 with creatinine prior to cath 1.93). Requiring high dose inotropes and in need of another contrast procedure at some point  for his left main disease. Asked to see for provision of CRRT.   AKI on CKD3/4 - contrast + cardiorenal syndrome from low EF. (Baseline CKD likely DM/nephrosclerosis - most urines over past 7 years with dipstick proteinuria). Tolerating CRRT. Acidosis and azotemia improving. Mental status has improved. Oozing from catheter. Stopped heparin with CRRT. Does he need heparin and asa brillinta also? Ask cards Continue with neg 25-50/hour off. K and phos OK so far.  Long term prognosis not clear though looks rather grim given cardiac status. Will provide RRT until we see how cardiac issues will play out. Would be very poor candidate for long term HD if there is no renal recovery (severe cardiomyopathy, low BP, no upper extremity vascular access targets).  Ischemic cardiomyopathy with LM and 3V disease, low EF - per Dr. Jeffie Pollock. Overall prognosis not clear but looks rather grim. On milronine, heparin.  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  Jamal Maes, MD [place] pager 07/19/2014, 9:19  AM

## 2014-07-19 NOTE — Progress Notes (Signed)
Advanced Heart Failure Rounding Note   Subjective:    Started on CVVHD 1/21. Removing ~50cc/hr. CVP 15.  Feels weak. Breathing ok.  Anuric. Denies CP. Bleeding at [facility] site   Objective:   Weight Range:  Vital Signs:   Temp:  [95.8 F (35.4 C)-97.8 F (36.6 C)] 97.8 F (36.6 C) (01/22 0730) Pulse Rate:  [26-116] 101 (01/22 1100) Resp:  [17-27] 27 (01/22 1100) BP: (69-133)/(19-92) 85/42 mmHg (01/22 1100) SpO2:  [92 %-99 %] 95 % (01/22 1100) Weight:  [67.9 kg (149 lb 11.1 oz)] 67.9 kg (149 lb 11.1 oz) (01/22 0500) Last BM Date: 07/16/14  Weight change: Filed Weights   07/17/14 0500 07/18/14 0420 07/19/14 0500  Weight: 68.2 kg (150 lb 5.7 oz) 68.8 kg (151 lb 10.8 oz) 67.9 kg (149 lb 11.1 oz)    Intake/Output:   Intake/Output Summary (Last 24 hours) at 07/19/14 1149 Last data filed at 07/19/14 1100  Gross per 24 hour  Intake 1145.67 ml  Output   1537 ml  Net -391.33 ml     Physical Exam: General: Pale. Chronically ill appearing. No dyspnea. Lying in bed.  HEENT: normal x for poor dentition Neck: supple. RIJ swan sheath. LI trialysis  Carotids 2+ bilat; no bruits. No lymphadenopathy or thryomegaly appreciated. Cor: PMI nondisplaced. Regular tachy + PVCs. + 2/6 MR Lungs: decreased throughout.  Abdomen: soft, nontender, nondistended. No hepatosplenomegaly. No bruits or masses. Sluggish bowel sounds. Extremities: no cyanosis, clubbing, rash, 1+ edema on R. + L BKA  Neuro:slightly lethargic cranial nerves grossly intact. moves all 4 extremities w/o difficulty. Affect pleasant.  Telemetry: Sinus tach ~ 100 with PVCs  Labs: Basic Metabolic Panel:  Recent Labs Lab 07/07/2014 0234 07/16/14 0555 07/17/14 0445 07/18/14 0202 07/18/14 1600 07/19/14 0420  NA 131* 132* 131* 128* 129* 134*  K 3.8 3.7 3.6 3.5 3.9 4.0  CL 98 100 100 95* 96 99  CO2 23 18* 21 18* 18* 21  GLUCOSE 132* 148* 136* 154* 147* 111*  BUN 47* 51* 58* 65* 68* 43*  CREATININE 2.46* 2.98* 3.68* 4.78*  5.19* 3.75*  CALCIUM 8.0* 8.0* 8.1* 8.0* 7.8* 7.8*  MG 2.0  --   --   --   --  2.2  PHOS  --   --   --   --  5.8* 3.6    Liver Function Tests:  Recent Labs Lab 07/18/14 1600 07/19/14 0420  ALBUMIN 2.6* 2.7*   No results for input(s): LIPASE, AMYLASE in the last 168 hours. No results for input(s): AMMONIA in the last 168 hours.  CBC:  Recent Labs Lab 07/16/14 0845 07/17/14 0440 07/18/14 0220 07/19/14 0420  WBC 13.3* 10.0 12.0* 10.2  HGB 12.3* 11.2* 11.5* 10.5*  HCT 36.0* 33.0* 33.6* 31.0*  MCV 80.7 79.7 81.0 79.1  PLT 151 130* 149* 138*    Cardiac Enzymes: No results for input(s): CKTOTAL, CKMB, CKMBINDEX, TROPONINI in the last 168 hours.  BNP: BNP (last 3 results) No results for input(s): PROBNP in the last 8760 hours.   Other results:    Imaging: US Renal  07/17/2014   CLINICAL DATA:  Initial encounter for renal failure  EXAM: RENAL/URINARY TRACT ULTRASOUND COMPLETE  COMPARISON:  None.  FINDINGS: Right Kidney:  Length: 10.5 cm. Echogenicity within normal limits. No mass or hydronephrosis visualized.  Left Kidney:  Length: 10.7 cm. Echogenicity within normal limits. No mass or hydronephrosis visualized.  Bladder:  Appears normal for degree of bladder distention.  Note:  Large bilateral pleural  effusions evident.  IMPRESSION: No evidence for hydronephrosis.  Large bilateral pleural effusions.   Electronically Signed   By: Misty Stanley M.D.   On: 07/17/2014 16:50   Dg Chest Port 1 View  07/18/2014   CLINICAL DATA:  Post left hemodialysis catheter placement  EXAM: PORTABLE CHEST - 1 VIEW  COMPARISON:  07/18/2014  FINDINGS: There is a new left internal jugular central line with tip in the expected location of the SVC just above the cavoatrial junction. There is no pneumothorax. There are moderate pleural effusions bilaterally. There is left base consolidation which may be worsened from the earlier study.  The right jugular central line appears unchanged from earlier  exam.  IMPRESSION: New left internal jugular central line with tip in expected location of the low SVC. No pneumothorax.  Bilateral moderate pleural effusions.  Left base consolidation.   Electronically Signed   By: Andreas Newport M.D.   On: 07/18/2014 15:22   Dg Chest Port 1 View  07/18/2014   CLINICAL DATA:  Shortness of breath.  EXAM: PORTABLE CHEST - 1 VIEW  COMPARISON:  05/13/2015.  FINDINGS: Interval removal of Swan-Ganz catheter. Right IJ sheath noted in good anatomic position. Mediastinum and hilar structures normal. Cardiomegaly with bilateral pulmonary alveolar infiltrates and pleural effusions persist with congestive heart failure noted. Similar findings on prior exam.  IMPRESSION: 1. Congestive heart failure pulmonary edema and bilateral pleural effusions. No change from prior exam. 2. Interval removal of Swan-Ganz catheter. Right IJ sheath in stable position .   Electronically Signed   By: Marcello Moores  Register   On: 07/18/2014 08:09   Dg Abd Portable 1v  07/17/2014   CLINICAL DATA:  continued nausea, pt was admitted to [facility] on 06/28/2014 with dyspnea, chest pain, and abnormal EKG. Pt with acute on chronic heart failure. Pt with severe multivessel CAD. He is being evaluated by cardiology for high risk PCI placement. 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  EXAM: PORTABLE ABDOMEN - 1 VIEW  COMPARISON:  07/14/2014  FINDINGS: Several gas distended small bowel loops in the mid abdomen, decreased in degree of distension since previous exam. Distal colon and rectum are nondilated. Pelvic vascular calcifications. Fixation hardware across the left femoral neck partially visualized. Spurring from bilateral acetabula.  IMPRESSION: 1. Slight improvement in mid abdominal gaseous bowel distention.   Electronically Signed    By: Arne Cleveland M.D.   On: 07/17/2014 13:28     Medications:     Scheduled Medications: . antiseptic oral rinse  15 mL Mouth Rinse BID  . aspirin EC  81 mg Oral Daily  . atorvastatin  80 mg Oral q1800  . insulin aspart  0-9 Units Subcutaneous TID WC  . isosorbide mononitrate  30 mg Oral Daily  . linagliptin  5 mg Oral Daily  . metoCLOPramide  5 mg Oral TID AC  . sodium chloride  10-40 mL Intracatheter Q12H  . ticagrelor  90 mg Oral BID    Infusions: . sodium chloride Stopped (07/17/14 1545)  . sodium chloride 10 mL/hr at 07/17/14 2127  . amiodarone 30 mg/hr (07/19/14 1036)  . heparin 650 Units/hr (07/19/14 1004)  . milrinone 0.375 mcg/kg/min (07/19/14 0558)  . dialysis replacement fluid (prismasate) 300 mL/hr at 07/19/14 1041  . dialysis replacement fluid (prismasate) 200 mL/hr at 07/18/14  1535  . dialysate (PRISMASATE) 1,500 mL/hr at 07/19/14 1032    PRN Medications: acetaminophen, heparin, heparin, morphine injection, ondansetron (ZOFRAN) IV, promethazine, sodium chloride   Assessment:   1. Acute systolic heart failure due to iCM EF 20% -> cardiogenic shock 2. 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 DM2 4. PAD s/p L BKA. Occluded bilateral axillary arteries 5. Acute renal failure. CVVHD started on 1/21 6. Severe protein-calorie malnutrition pre-albumin 13.9 7. NSVT 8. Hyperkalemia 9. Colon CA s/p colectomy 2009 and adjuvant chemo 10. Atrial flutter/a tach -> sinus rhythm on amio 11. Bilateral Pleural Effusions.  12. Hyponatremia  Plan/Discussion:    Now on CVVHD. Remains uremic. Volume status up by fluid removal limited by hypotension (SBP 80-90). Will decrease milrinone to 0.25. Add levophed to keep MAP >= 60.   Continue amio for AF/VT. Hold heparin for several hours given bleeding at [facility] site. Follow H/H.   Continue ASA/Brilinta/statin.  No b-blocker/ACE due to shock and renal failure.   Prognosis increasingly poor. Will need high-risk PCI  LM/LAD/LCX if recovers.   The patient is critically ill with multiple organ systems failure and requires high complexity decision making for assessment and support, frequent evaluation and titration of therapies, application of advanced monitoring technologies and extensive interpretation of multiple databases.   Critical Care Time devoted to patient care services described in this note is 45 Minutes.  Glori Bickers MD 07/19/2014, 11:49 AM  Advanced Heart Failure Team Pager [phone] (M-F; 7a - 4p)  Please contact [facility] for night-coverage after hours (4p -7a ) and weekends on [email or web address]

## 2014-07-19 NOTE — Progress Notes (Deleted)
R IJ hemodialysis catheter site bleeding-held pressure then  placed thrombin pad.heparin level elevated earlier.

## 2014-07-19 NOTE — Progress Notes (Signed)
LIJ hemodialysis site bleeding-held pressure then placed thrombin pad.heparin and ACT level elevated earlier.

## 2014-07-20 DIAGNOSIS — I214 Non-ST elevation (NSTEMI) myocardial infarction: Secondary | ICD-10-CM

## 2014-07-20 LAB — CBC
HEMATOCRIT: 29.6 % — AB (ref 39.0–52.0)
Hemoglobin: 9.9 g/dL — ABNORMAL LOW (ref 13.0–17.0)
MCH: 27 pg (ref 26.0–34.0)
MCHC: 33.4 g/dL (ref 30.0–36.0)
MCV: 80.9 fL (ref 78.0–100.0)
Platelets: 168 10*3/uL (ref 150–400)
RBC: 3.66 MIL/uL — ABNORMAL LOW (ref 4.22–5.81)
RDW: 14.2 % (ref 11.5–15.5)
WBC: 12 10*3/uL — AB (ref 4.0–10.5)

## 2014-07-20 LAB — RENAL FUNCTION PANEL
ANION GAP: 10 (ref 5–15)
Albumin: 2.5 g/dL — ABNORMAL LOW (ref 3.5–5.2)
Albumin: 2.5 g/dL — ABNORMAL LOW (ref 3.5–5.2)
Anion gap: 10 (ref 5–15)
BUN: 22 mg/dL (ref 6–23)
BUN: 18 mg/dL (ref 6–23)
CO2: 25 mmol/L (ref 19–32)
CO2: 24 mmol/L (ref 19–32)
Calcium: 7.5 mg/dL — ABNORMAL LOW (ref 8.4–10.5)
Calcium: 7.6 mg/dL — ABNORMAL LOW (ref 8.4–10.5)
Chloride: 98 mmol/L (ref 96–112)
Chloride: 98 mmol/L (ref 96–112)
Creatinine, Ser: 2.72 mg/dL — ABNORMAL HIGH (ref 0.50–1.35)
Creatinine, Ser: 2.2 mg/dL — ABNORMAL HIGH (ref 0.50–1.35)
GFR calc Af Amer: 27 mL/min — ABNORMAL LOW (ref >90)
GFR calc non Af Amer: 30 mL/min — ABNORMAL LOW (ref >90)
GFR, EST AFRICAN AMERICAN: 35 mL/min — AB (ref >90)
GFR, EST NON AFRICAN AMERICAN: 23 mL/min — AB (ref >90)
Glucose, Bld: 161 mg/dL — ABNORMAL HIGH (ref 70–99)
Glucose, Bld: 197 mg/dL — ABNORMAL HIGH (ref 70–99)
POTASSIUM: 4 mmol/L (ref 3.5–5.1)
Phosphorus: 2.7 mg/dL (ref 2.3–4.6)
Phosphorus: 2.3 mg/dL (ref 2.3–4.6)
Potassium: 4.1 mmol/L (ref 3.5–5.1)
SODIUM: 133 mmol/L — AB (ref 135–145)
Sodium: 132 mmol/L — ABNORMAL LOW (ref 135–145)

## 2014-07-20 LAB — GLUCOSE, CAPILLARY
Glucose-Capillary: 185 mg/dL — ABNORMAL HIGH (ref 70–99)
Glucose-Capillary: 166 mg/dL — ABNORMAL HIGH (ref 70–99)
Glucose-Capillary: 178 mg/dL — ABNORMAL HIGH (ref 70–99)

## 2014-07-20 LAB — CARBOXYHEMOGLOBIN
Carboxyhemoglobin: 1 % (ref 0.5–1.5)
Methemoglobin: 0.9 % (ref 0.0–1.5)
O2 Saturation: 61.6 %
Total hemoglobin: 9.9 g/dL — ABNORMAL LOW (ref 13.5–18.0)

## 2014-07-20 LAB — MAGNESIUM
Magnesium: 2.4 mg/dL (ref 1.5–2.5)
Magnesium: 2.5 mg/dL (ref 1.5–2.5)

## 2014-07-20 LAB — HEPARIN LEVEL (UNFRACTIONATED)
HEPARIN UNFRACTIONATED: 0.1 [IU]/mL — AB (ref 0.30–0.70)
HEPARIN UNFRACTIONATED: 0.18 [IU]/mL — AB (ref 0.30–0.70)
Heparin Unfractionated: 0.17 IU/mL — ABNORMAL LOW (ref 0.30–0.70)

## 2014-07-20 LAB — APTT: APTT: 71 s — AB (ref 24–37)

## 2014-07-20 NOTE — Progress Notes (Signed)
Called Fellow on call to report vfib/pt almost lost consciousness. Pt was alert when RN entered room. Said "I felt that",but denies chest pain. Had run of bigeminy and then in sinus. Md aware,await Magnesium add on.  Parthenia Ames

## 2014-07-20 NOTE — Progress Notes (Signed)
ANTICOAGULATION CONSULT NOTE - Follow Up Consult  Pharmacy Consult for Heparin  Indication: atrial fibrillation/flutter  No Known Allergies  Patient Measurements: Height: 5\' 6"  (167.6 cm) Weight: 149 lb 11.1 oz (67.9 kg) IBW/kg (Calculated) : 63.8  Vital Signs: Temp: 97.8 F (36.6 C) (01/23 0400) Temp Source: Oral (01/23 0400) BP: 144/33 mmHg (01/23 0500) Pulse Rate: 99 (01/23 0500)  Labs:  Recent Labs  07/19/14 0100 07/19/14 0420 07/19/14 0745 07/19/14 1500 07/20/14 0500  HGB  --  10.5*  --  10.3* 9.9*  HCT  --  31.0*  --  30.0* 29.6*  PLT  --  138*  --  172 168  APTT  --  >200*  --   --   --   HEPARINUNFRC 0.89*  --  0.85*  --  0.10*  CREATININE  --  3.75*  --  2.96* 2.72*    Estimated Creatinine Clearance: 25.4 mL/min (by C-G formula based on Cr of 2.72).  Assessment: Sub-therapeutic heparin level. Pt is on CRRT with heparin syringe. Heparin was held 1/22 for HD cath site bleeding, looks much better now per RN.   Goal of Therapy:  Heparin level 0.3-0.7 units/ml Monitor platelets by anticoagulation protocol: Yes   Plan:  -Increase heparin drip to 750 units/hr -1200 HL -Daily CBC/HL -Monitor for bleeding  Narda Bonds 07/20/2014,5:53 AM

## 2014-07-20 NOTE — Progress Notes (Signed)
ANTICOAGULATION CONSULT NOTE - Follow Up Consult  Pharmacy Consult for heparin Indication: atrial fibrillation  No Known Allergies  Patient Measurements: Height: 5\' 6"  (167.6 cm) Weight: 148 lb 2.4 oz (67.2 kg) IBW/kg (Calculated) : 63.8  Vital Signs: Temp: 97.7 F (36.5 C) (01/23 1200) Temp Source: Oral (01/23 1200) BP: 118/38 mmHg (01/23 1200) Pulse Rate: 96 (01/23 1200)  Labs:  Recent Labs  07/19/14 0420 07/19/14 0745 07/19/14 1500 07/20/14 0500 07/20/14 1200  HGB 10.5*  --  10.3* 9.9*  --   HCT 31.0*  --  30.0* 29.6*  --   PLT 138*  --  172 168  --   APTT >200*  --   --  71*  --   HEPARINUNFRC  --  0.85*  --  0.10* 0.18*  CREATININE 3.75*  --  2.96* 2.72*  --     Estimated Creatinine Clearance: 25.4 mL/min (by C-G formula based on Cr of 2.72).   Medications:  Scheduled:  . antiseptic oral rinse  15 mL Mouth Rinse BID  . aspirin EC  81 mg Oral Daily  . atorvastatin  80 mg Oral q1800  . insulin aspart  0-9 Units Subcutaneous TID WC  . isosorbide mononitrate  30 mg Oral Daily  . linagliptin  5 mg Oral Daily  . sodium chloride  10-40 mL Intracatheter Q12H  . ticagrelor  90 mg Oral BID   Infusions:  . sodium chloride Stopped (07/17/14 1545)  . sodium chloride Stopped (07/19/14 1228)  . amiodarone 30 mg/hr (07/20/14 1200)  . heparin 750 Units/hr (07/20/14 1200)  . milrinone 0.25 mcg/kg/min (07/20/14 1200)  . norepinephrine (LEVOPHED) Adult infusion 3.013 mcg/min (07/20/14 1200)  . dialysis replacement fluid (prismasate) 300 mL/hr at 07/20/14 0432  . dialysis replacement fluid (prismasate) 200 mL/hr at 07/18/14 1535  . dialysate (PRISMASATE) 1,500 mL/hr at 07/20/14 1124    Assessment: 64 yo m with afib/aflutter on heparin.  Heparin was on hold yesterday due to bleeding from HD cath site.  Site is now repaired and noon HL was subtherapeutic at 0.18 on 750 units/hr.  Hgb 9.9, plts 168, no bleeding per RN.  Goal of Therapy:  Heparin level 0.3-0.7  units/ml Monitor platelets by anticoagulation protocol: Yes   Plan:  Increase heparin to 850 units/hr HL @ 2100 Monitor hgb/plts, s/s of bleeding, clinical course  Cassie L. Nicole Kindred, PharmD Clinical Pharmacy Resident Pager: [phone] 07/20/2014 1:03 PM

## 2014-07-20 NOTE — Progress Notes (Addendum)
ANTICOAGULATION CONSULT NOTE - Follow Up Consult  Pharmacy Consult for Heparin  Indication: atrial fibrillation/flutter  No Known Allergies  Patient Measurements: Height: 5\' 6"  (167.6 cm) Weight: 148 lb 2.4 oz (67.2 kg) IBW/kg (Calculated) : 63.8  Vital Signs: Temp: 97.8 F (36.6 C) (01/23 2000) Temp Source: Oral (01/23 2000) BP: 108/47 mmHg (01/23 2100) Pulse Rate: 97 (01/23 2100)  Labs:  Recent Labs  07/19/14 0420  07/19/14 1500 07/20/14 0500 07/20/14 1200 07/20/14 1635 07/20/14 2100  HGB 10.5*  --  10.3* 9.9*  --   --   --   HCT 31.0*  --  30.0* 29.6*  --   --   --   PLT 138*  --  172 168  --   --   --   APTT >200*  --   --  71*  --   --   --   HEPARINUNFRC  --   < >  --  0.10* 0.18*  --  0.17*  CREATININE 3.75*  --  2.96* 2.72*  --  2.20*  --   < > = values in this interval not displayed.  Estimated Creatinine Clearance: 31.4 mL/min (by C-G formula based on Cr of 2.2).  Assessment: Sub-therapeutic heparin level. Pt is on CRRT with heparin syringe. Heparin was held 1/22 for HD cath site bleeding, no issues now.   Goal of Therapy:  Heparin level 0.3-0.7 units/ml Monitor platelets by anticoagulation protocol: Yes   Plan:  -Increase heparin drip to 1000 units/hr -HL with AM labs -Daily CBC/HL -Monitor for bleeding  Narda Bonds 07/20/2014,10:57 PM  ============================ Addendum 6:36 AM HL 0.32 -Continue at 1000 units/hr -1200 HL JLedford, PharmD

## 2014-07-20 NOTE — Progress Notes (Signed)
Patient ID: Tyler Erickson, male   DOB: 08-May-1951, 64 y.o.   MRN: [id number] Advanced Heart Failure Rounding Note   Subjective:    Started on CVVHD 1/21. Removing ~50cc/hr. CVP 13-14. Co-ox 62%.   Feels weak. Breathing ok, no chest pain.  Anuric. Denies CP.  Norepinephrine being titrated down.    Objective:   Weight Range:  Vital Signs:   Temp:  [97.6 F (36.4 C)-97.8 F (36.6 C)] 97.8 F (36.6 C) (01/23 0400) Pulse Rate:  [95-111] 98 (01/23 0700) Resp:  [15-27] 23 (01/23 0700) BP: (83-145)/(11-75) 113/71 mmHg (01/23 0700) SpO2:  [91 %-98 %] 98 % (01/23 0700) Weight:  [148 lb 2.4 oz (67.2 kg)] 148 lb 2.4 oz (67.2 kg) (01/23 0500) Last BM Date: 07/16/14  Weight change: Filed Weights   07/18/14 0420 07/19/14 0500 07/20/14 0500  Weight: 151 lb 10.8 oz (68.8 kg) 149 lb 11.1 oz (67.9 kg) 148 lb 2.4 oz (67.2 kg)    Intake/Output:   Intake/Output Summary (Last 24 hours) at 07/20/14 0819 Last data filed at 07/20/14 0800  Gross per 24 hour  Intake 1386.8 ml  Output   2444 ml  Net -1057.2 ml     Physical Exam: General: Pale. Chronically ill appearing. No dyspnea. Lying in bed.  HEENT: normal x for poor dentition Neck: supple. RIJ swan sheath. LI trialysis. JVP 12 cm.  Carotids 2+ bilat; no bruits. No lymphadenopathy or thryomegaly appreciated. Cor: PMI nondisplaced. Regular tachy + PVCs. + 2/6 MR Lungs: decreased throughout.  Abdomen: soft, nontender, nondistended. No hepatosplenomegaly. No bruits or masses. Sluggish bowel sounds. Extremities: no cyanosis, clubbing, rash, 1+ edema on R. + L BKA  Neuro:slightly lethargic cranial nerves grossly intact. moves all 4 extremities w/o difficulty. Affect pleasant.  Telemetry: NSR 90s with PVCs  Labs: Basic Metabolic Panel:  Recent Labs Lab 06/30/2014 0234  07/18/14 0202 07/18/14 1600 07/19/14 0420 07/19/14 1500 07/20/14 0500  NA 131*  < > 128* 129* 134* 133* 133*  K 3.8  < > 3.5 3.9 4.0 4.0 4.1  CL 98  < > 95* 96 99  98 98  CO2 23  < > 18* 18* 21 25 25   GLUCOSE 132*  < > 154* 147* 111* 139* 161*  BUN 47*  < > 65* 68* 43* 29* 22  CREATININE 2.46*  < > 4.78* 5.19* 3.75* 2.96* 2.72*  CALCIUM 8.0*  < > 8.0* 7.8* 7.8* 7.7* 7.5*  MG 2.0  --   --   --  2.2  --  2.4  PHOS  --   --   --  5.8* 3.6 3.0 2.7  < > = values in this interval not displayed.  Liver Function Tests:  Recent Labs Lab 07/18/14 1600 07/19/14 0420 07/19/14 1500 07/20/14 0500  ALBUMIN 2.6* 2.7* 2.6* 2.5*   No results for input(s): LIPASE, AMYLASE in the last 168 hours. No results for input(s): AMMONIA in the last 168 hours.  CBC:  Recent Labs Lab 07/17/14 0440 07/18/14 0220 07/19/14 0420 07/19/14 1500 07/20/14 0500  WBC 10.0 12.0* 10.2 13.5* 12.0*  NEUTROABS  --   --   --  10.6*  --   HGB 11.2* 11.5* 10.5* 10.3* 9.9*  HCT 33.0* 33.6* 31.0* 30.0* 29.6*  MCV 79.7 81.0 79.1 79.4 80.9  PLT 130* 149* 138* 172 168    Cardiac Enzymes: No results for input(s): CKTOTAL, CKMB, CKMBINDEX, TROPONINI in the last 168 hours.  BNP: BNP (last 3 results) No results for input(s): PROBNP  in the last 8760 hours.   Other results:    Imaging: Dg Chest Port 1 View  07/18/2014   CLINICAL DATA:  Post left hemodialysis catheter placement  EXAM: PORTABLE CHEST - 1 VIEW  COMPARISON:  07/18/2014  FINDINGS: There is a new left internal jugular central line with tip in the expected location of the SVC just above the cavoatrial junction. There is no pneumothorax. There are moderate pleural effusions bilaterally. There is left base consolidation which may be worsened from the earlier study.  The right jugular central line appears unchanged from earlier exam.  IMPRESSION: New left internal jugular central line with tip in expected location of the low SVC. No pneumothorax.  Bilateral moderate pleural effusions.  Left base consolidation.   Electronically Signed   By: Andreas Newport M.D.   On: 07/18/2014 15:22     Medications:     Scheduled  Medications: . antiseptic oral rinse  15 mL Mouth Rinse BID  . aspirin EC  81 mg Oral Daily  . atorvastatin  80 mg Oral q1800  . insulin aspart  0-9 Units Subcutaneous TID WC  . isosorbide mononitrate  30 mg Oral Daily  . linagliptin  5 mg Oral Daily  . sodium chloride  10-40 mL Intracatheter Q12H  . ticagrelor  90 mg Oral BID    Infusions: . sodium chloride Stopped (07/17/14 1545)  . sodium chloride Stopped (07/19/14 1228)  . amiodarone 30 mg/hr (07/19/14 2238)  . heparin 750 Units/hr (07/20/14 0600)  . milrinone 0.25 mcg/kg/min (07/20/14 0230)  . norepinephrine (LEVOPHED) Adult infusion 3 mcg/min (07/20/14 0650)  . dialysis replacement fluid (prismasate) 300 mL/hr at 07/20/14 0432  . dialysis replacement fluid (prismasate) 200 mL/hr at 07/18/14 1535  . dialysate (PRISMASATE) 1,500 mL/hr at 07/20/14 0757    PRN Medications: acetaminophen, heparin, heparin, morphine injection, ondansetron (ZOFRAN) IV, promethazine, sodium chloride   Assessment:   1. Acute systolic heart failure due to iCM EF 20% -> cardiogenic shock 2. 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 DM2 4. PAD s/p L BKA. Occluded bilateral axillary arteries 5. Acute renal failure. CVVHD started on 1/21 6. Severe protein-calorie malnutrition pre-albumin 13.9 7. NSVT 8. Hyperkalemia 9. Colon CA s/p colectomy 2009 and adjuvant chemo 10. Atrial flutter/a tach -> sinus rhythm on amio 11. Bilateral Pleural Effusions.  12. Hyponatremia  Plan/Discussion:    Now on CVVHD. Remains uremic. Volume status up but fluid removal has been limited by hypotension.  BP better this morning, norepinephrine has been titrated down. CVP 13-14 with co-ox 62%.  Continue current milrinone, can gradually bring down norepinephrine keeping MAP > 60.  Will increase UF slowly, aiming for 100 cc/hr today.   Continue amio for AF/VT. Heparin has been resumed, hemoglobin mildly lower.  Continue to follow closely.    Continue  ASA/Brilinta/statin.  No b-blocker/ACE due to shock and renal failure.   Prognosis increasingly poor. Will need high-risk PCI LM/LAD/LCX if recovers.   Loralie Champagne 07/20/2014 8:22 AM

## 2014-07-20 NOTE — Progress Notes (Signed)
[facility] Rounding Note Subjective:  Alert and awake No c/o SOB No technical issues with CRRT (no heparin) Bleeding around catheter stopped Dr. Aundra Dubin wants to try for 100/hour off - tolerated this past 2 hours  Objective Vital signs in last 24 hours: Filed Vitals:   07/20/14 0600 07/20/14 0700 07/20/14 0800 07/20/14 0900  BP: 145/31 113/71 133/55 133/36  Pulse: 100 98 97 98  Temp:   97.9 F (36.6 C)   TempSrc:   Oral   Resp: 27 23 17 21   Height:      Weight:      SpO2: 96% 98% 96% 97%   Physical Exam:  BP 133/36 mmHg  Pulse 98  Temp(Src) 97.9 F (36.6 C) (Oral)  Resp 21  Ht 5\' 6"  (1.676 m)  Wt 67.2 kg (148 lb 2.4 oz)  BMI 23.92 kg/m2  SpO2 97%  CVP 13 Gen: Pale appearing WM Flat affect. Oriented VS as noted - BP cuff on right leg Skin: with scarring right arm from previous infection (remote) Hands cool Line in right neck (IJ swan sheath) and trialysis catheter in left neck oozing Chest: Ant fairly clear but BS diminished Heart: Tachy S1S2 2/6 murmur systolic no diastolic Abdomen: soft, scars from previous colon surgery. Not distended Ext: Left BKA 1+ RLE edema and trace-1+ post thigh edema unchanged  Weights: 1/16 64.1 kg 1/18 66.1 kg 1/20 68.2 kg 1/21 68.8 kg  CRRT started 1/22 67.9 kg 1/23 67.2  Labs:  Recent Labs Lab 07/16/14 0555 07/17/14 0445 07/18/14 0202 07/18/14 1600 07/19/14 0420 07/19/14 1500 07/20/14 0500  NA 132* 131* 128* 129* 134* 133* 133*  K 3.7 3.6 3.5 3.9 4.0 4.0 4.1  CL 100 100 95* 96 99 98 98  CO2 18* 21 18* 18* 21 25 25   GLUCOSE 148* 136* 154* 147* 111* 139* 161*  BUN 51* 58* 65* 68* 43* 29* 22  CREATININE 2.98* 3.68* 4.78* 5.19* 3.75* 2.96* 2.72*  CALCIUM 8.0* 8.1* 8.0* 7.8* 7.8* 7.7* 7.5*  PHOS  --   --   --  5.8* 3.6 3.0 2.7    Recent Labs Lab 07/19/14 0420 07/19/14 1500 07/20/14 0500  ALBUMIN 2.7* 2.6* 2.5*    Recent Labs Lab 07/18/14 0220 07/19/14 0420 07/19/14 1500 07/20/14 0500  WBC  12.0* 10.2 13.5* 12.0*  NEUTROABS  --   --  10.6*  --   HGB 11.5* 10.5* 10.3* 9.9*  HCT 33.6* 31.0* 30.0* 29.6*  MCV 81.0 79.1 79.4 80.9  PLT 149* 138* 172 168    Recent Labs Lab 07/18/14 2123 07/19/14 0742 07/19/14 1203 07/19/14 1601 07/19/14 2232  GLUCAP 105* 130* 121* 138* 135*   Studies/Results: Dg Chest Port 1 View  07/18/2014   CLINICAL DATA:  Post left hemodialysis catheter placement  EXAM: PORTABLE CHEST - 1 VIEW  COMPARISON:  07/18/2014  FINDINGS: There is a new left internal jugular central line with tip in the expected location of the SVC just above the cavoatrial junction. There is no pneumothorax. There are moderate pleural effusions bilaterally. There is left base consolidation which may be worsened from the earlier study.  The right jugular central line appears unchanged from earlier exam.  IMPRESSION: New left internal jugular central line with tip in expected location of the low SVC. No pneumothorax.  Bilateral moderate pleural effusions.  Left base consolidation.   Electronically Signed   By: Andreas Newport M.D.   On: 07/18/2014 15:22   Medications: . sodium chloride Stopped (07/17/14 1545)  .  sodium chloride Stopped (07/19/14 1228)  . amiodarone 30 mg/hr (07/20/14 0904)  . heparin 750 Units/hr (07/20/14 0900)  . milrinone 0.25 mcg/kg/min (07/20/14 0900)  . norepinephrine (LEVOPHED) Adult infusion 3.013 mcg/min (07/20/14 0900)  . dialysis replacement fluid (prismasate) 300 mL/hr at 07/20/14 0432  . dialysis replacement fluid (prismasate) 200 mL/hr at 07/18/14 1535  . dialysate (PRISMASATE) 1,500 mL/hr at 07/20/14 0757   . antiseptic oral rinse  15 mL Mouth Rinse BID  . aspirin EC  81 mg Oral Daily  . atorvastatin  80 mg Oral q1800  . insulin aspart  0-9 Units Subcutaneous TID WC  . isosorbide mononitrate  30 mg Oral Daily  . linagliptin  5 mg Oral Daily  . sodium chloride  10-40 mL Intracatheter Q12H  . ticagrelor  90 mg Oral BID    I  have reviewed  scheduled and prn medications.  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 (Baseline CKD 3 with creatinine prior to cath 1.93). Requiring high dose inotropes/pressor support and in need of another contrast procedure at some point for his left main disease. Asked to see for provision of CRRT.  AKI on CKD3/4 - contrast (cath) + cardiorenal syndrome from low EF. (Baseline CKD likely DM/nephrosclerosis - most urines over past 7 years with dipstick proteinuria).  Anuric AKI Tolerating CRRT.  K stable, phos trending down but no replacement needed yet Acidosis and azotemia improved, mental status improved Increase fluid removal to net neg -100/hour.  Will provide RRT until we see how cardiac issues will play out.  Would be very poor (in fact not) candidate for long term HD if there is no renal recovery (severe cardiomyopathy, low BP, no upper extremity vascular access targets).  Ischemic cardiomyopathy/EF 20-%  with LM and 3V disease, low EF On milronine/norep for cardiogenic shock Needs high risk PCI of left main/LAD andCx at some point Overall prognosis not clear but looks rather grim.   AFF -  Heparin/amio.  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   Jamal Maes, MD [facility] [phone] pager 07/20/2014, 10:09 AM

## 2014-07-21 DIAGNOSIS — R0600 Dyspnea, unspecified: Secondary | ICD-10-CM | POA: Insufficient documentation

## 2014-07-21 DIAGNOSIS — Z515 Encounter for palliative care: Secondary | ICD-10-CM

## 2014-07-21 LAB — CARBOXYHEMOGLOBIN
CARBOXYHEMOGLOBIN: 1 % (ref 0.5–1.5)
Carboxyhemoglobin: 1.3 % (ref 0.5–1.5)
METHEMOGLOBIN: 0.9 % (ref 0.0–1.5)
Methemoglobin: 1 % (ref 0.0–1.5)
O2 Saturation: 47.5 %
O2 Saturation: 64.5 %
TOTAL HEMOGLOBIN: 9.1 g/dL — AB (ref 13.5–18.0)
TOTAL HEMOGLOBIN: 9.9 g/dL — AB (ref 13.5–18.0)

## 2014-07-21 LAB — CBC
HEMATOCRIT: 29.5 % — AB (ref 39.0–52.0)
Hemoglobin: 9.7 g/dL — ABNORMAL LOW (ref 13.0–17.0)
MCH: 26.8 pg (ref 26.0–34.0)
MCHC: 32.9 g/dL (ref 30.0–36.0)
MCV: 81.5 fL (ref 78.0–100.0)
PLATELETS: 145 10*3/uL — AB (ref 150–400)
RBC: 3.62 MIL/uL — ABNORMAL LOW (ref 4.22–5.81)
RDW: 14.3 % (ref 11.5–15.5)
WBC: 11.1 10*3/uL — ABNORMAL HIGH (ref 4.0–10.5)

## 2014-07-21 LAB — RENAL FUNCTION PANEL
ALBUMIN: 2.4 g/dL — AB (ref 3.5–5.2)
Anion gap: 9 (ref 5–15)
BUN: 16 mg/dL (ref 6–23)
CO2: 25 mmol/L (ref 19–32)
CREATININE: 2.13 mg/dL — AB (ref 0.50–1.35)
Calcium: 7.6 mg/dL — ABNORMAL LOW (ref 8.4–10.5)
Chloride: 97 mmol/L (ref 96–112)
GFR calc Af Amer: 37 mL/min — ABNORMAL LOW (ref >90)
GFR, EST NON AFRICAN AMERICAN: 32 mL/min — AB (ref >90)
GLUCOSE: 153 mg/dL — AB (ref 70–99)
POTASSIUM: 3.9 mmol/L (ref 3.5–5.1)
Phosphorus: 2 mg/dL — ABNORMAL LOW (ref 2.3–4.6)
Sodium: 131 mmol/L — ABNORMAL LOW (ref 135–145)

## 2014-07-21 LAB — MAGNESIUM: Magnesium: 2.6 mg/dL — ABNORMAL HIGH (ref 1.5–2.5)

## 2014-07-21 LAB — HEPARIN LEVEL (UNFRACTIONATED)
HEPARIN UNFRACTIONATED: 0.32 [IU]/mL (ref 0.30–0.70)
HEPARIN UNFRACTIONATED: 0.2 [IU]/mL — AB (ref 0.30–0.70)
Heparin Unfractionated: 0.25 IU/mL — ABNORMAL LOW (ref 0.30–0.70)

## 2014-07-21 LAB — GLUCOSE, CAPILLARY
Glucose-Capillary: 207 mg/dL — ABNORMAL HIGH (ref 70–99)
Glucose-Capillary: 184 mg/dL — ABNORMAL HIGH (ref 70–99)

## 2014-07-21 LAB — APTT: aPTT: 129 seconds — ABNORMAL HIGH (ref 24–37)

## 2014-07-21 MED ORDER — SODIUM PHOSPHATE 3 MMOLE/ML IV SOLN
10.0000 mmol | Freq: Once | INTRAVENOUS | Status: AC
  Administered 2014-07-21: 10 mmol via INTRAVENOUS
  Filled 2014-07-21: qty 3.33

## 2014-07-21 NOTE — Consult Note (Signed)
Patient Tyler Erickson      DOB: Aug 19, 1950      HER:[id number]     Consult Note from the Palliative Medicine Team at [facility] Requested by: Dr Aundra Dubin     PCP: Donnie Coffin, MD Reason for Consultation: CHF, ESRD, EOL     Phone Number:[phone]  Assessment/Recommendations: 64 yo male with CAD, ICM with EF 25%, AKI on CKD with likely ESRD/cardiorenal. Prolonged hosptial stay with inotropic support and CVVH. Little improvement and plans to transition to comfort care in coming days if trend continues.   1.  Code Status: Full, did not discuss today.   2. Goals of Care: Tyler Erickson had difficult conversation with cardiology and nephrology today. His expectation is that CVVH will likely stop tomorrow and will work towards keeping him comfortable. He has stoic acceptance of this, but at times becomes tearful and alludes to being able to see his parents again.  Does not open up much with me though.  Tells me he is doing okay, and I am not able to illicit any worries or fears. He knows that he has complicated disease and not good options for treating.   I offered to call his family but he declined. Michela Pitcher he spoke to his sister about this earlier today.  He has no requests for anything at this time.  Suspect he has a lot of underlying emotions/struggles about dying that he has a hard time sharing with me. At times, would let tears slip through during conversation.  I will follow-up tomorrow.     3. Symptom Management:   1. Terminal Dyspnea/Pain- suspect dyspnea may be biggest issue with removal of CVVH and inotropes.  Would avoid morphine although the chance of OIN remains small. Hydromorphone or fentanyl should work just as well and greatly reduces even the small risk of potential neurotoxicity. If questions arise in dosing, I would be happy to be of assistance. Having at least PRN benzo will also likely be helpful.    4. Psychosocial/Spiritual: 2 sisters and a brother. Never married and  no children. Was living at home with his brother prior to hospital. Reports christian faith but does not have active church community. Declines spiritual care consult.    Brief HPI: 65 yo male with PMHx of CAD, systolic HF, DM, CKD, h/o ischemic foot requiring L BKA in 8563 complicated by prolonged hospitalization with MSOF. He was admitted with progressive history of dyspnea/PND, chest pains.  Had cardiac cath at time of admission which revealed severe left main and 3 vessel disease along with severely decompensated ventricular function. Deemed poor operative candidate for revascularization.  RHC on 1/5 confirmed low output state and started on milrinone drip.  Developed worsening renal function leading to RRT/CVVH with pressor requirements.  Remained anuric and felt to possibly have possible cardiorenal syndrome. Not felt to be good candidate for long term dialysis given above issues.  Cardiology and nephrology discussed this with patient today as well as comfort care.  Patient plans to stop CVVH tomorrow per his discussions with them today and focus on comfort care.  In speaking with Tyler Erickson today, he remains stoic and reports no worries/concerns.  Feels "ready to die" and see his parents again. Denies any pain, dyspnea, n/v, anxiety/depression.       PMH:  Past Medical History  Diagnosis Date  . History of colon cancer     S/P surgery/chemotherapy  . Peripheral sensory neuropathy due to type 2 diabetes mellitus   .  CVA (cerebral infarction) 06/2012    [name] 07/08/2014  . Kidney stones   . S/P unilateral BKA (below knee amputation) left 07/28/2012  . Stage 3 chronic kidney disease due to type 2 diabetes mellitus 07/08/2014  . Ischemic cardiomyopathy   . Cervical spinal stenosis   . Coronary artery disease   . Colon cancer   . Heart murmur   . Peripheral vascular disease     involving both arms/notes 07/08/2014  . Atherosclerosis of arteries of extremities 07/08/2014  . Type 2 diabetes mellitus  with vascular disease 03/14/2009  . Pleural effusion 06/2012    [name] 07/08/2014  . CHF (congestive heart failure)     systolic/notes 4/74/2595  . Myocardial infarction     evidence of a previous old anterior MI on his EKG /notes 07/08/2014     PSH: Past Surgical History  Procedure Laterality Date  . 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 Ileostomy closure  10/31/2009  . Portacath placement  08/08/2008  . Amputation  06/30/2012    Procedure: AMPUTATION BELOW KNEE;  Surgeon: Elam Dutch, MD;  Location: [place];  Service: Vascular;  Laterality: Left;  . Intraoperative arteriogram  06/30/2012    Procedure: INTRA OPERATIVE ARTERIOGRAM;  Surgeon: Elam Dutch, MD;  Location: [place];  Service: Vascular;  Laterality: Right;  . Aortogram  06/30/2012    Procedure: AORTOGRAM;  Surgeon: Elam Dutch, MD;  Location: [facility] OR;  Service: Vascular;  Laterality: Right;  Right upper extremity arch   . Tee without cardioversion  07/05/2012    Procedure: TRANSESOPHAGEAL ECHOCARDIOGRAM (TEE);  Surgeon: Thayer Headings, MD;  Location: [place];  Service: Cardiovascular;  Laterality: N/A;  . Port-a-cath removal    . Incision and drainage of wound N/A 10/11/2012    Procedure: IRRIGATION AND DEBRIDEMENT OF RIGHT FOREARM AND LEFT ELBOW WITH POSSIBLE SURGICAL PREP WITH PLACEMENT OF ACELL AND [place];  Surgeon: Theodoro Kos, DO;  Location: [place];  Service: Plastics;  Laterality: N/A;  IRRIGATION AND DEBRIDEMENT OF RIGHT FOREARM AND LEFT ELBOW WITH PLACEMENT OF ACELL AND VAC  . Hip pinning,cannulated Left 09/12/2013    Procedure: CANNULATED HIP PINNING;  Surgeon: Johnn Hai, MD;  Location: [place];  Service: Orthopedics;  Laterality: Left;  . Cardiac catheterization  07/18/2014  . Thoracentesis  06/2012    [name] 07/08/2014  . Left heart catheterization with coronary angiogram N/A 07/26/2014    Procedure: LEFT HEART CATHETERIZATION WITH CORONARY ANGIOGRAM;  Surgeon: Jacolyn Reedy, MD;  Location[facility] CATH  LAB;  Service: Cardiovascular;  Laterality: N/A;   I have reviewed the FH and SH and  If appropriate update it with new information. No Known Allergies Scheduled Meds: . antiseptic oral rinse  15 mL Mouth Rinse BID  . aspirin EC  81 mg Oral Daily  . atorvastatin  80 mg Oral q1800  . insulin aspart  0-9 Units Subcutaneous TID WC  . isosorbide mononitrate  30 mg Oral Daily  . linagliptin  5 mg Oral Daily  . sodium chloride  10-40 mL Intracatheter Q12H  . sodium phosphate  Dextrose 5% IVPB  10 mmol Intravenous Once  . ticagrelor  90 mg Oral BID   Continuous Infusions: . sodium chloride Stopped (07/17/14 1545)  . sodium chloride Stopped (07/19/14 1228)  . amiodarone 30 mg/hr (07/21/14 1300)  . heparin 1,100 Units/hr (07/21/14 1405)  . milrinone 0.25 mcg/kg/min (07/21/14 1200)  . norepinephrine (LEVOPHED) Adult infusion 3.013 mcg/min (07/21/14 1200)  .  dialysis replacement fluid (prismasate) 300 mL/hr at 07/20/14 2209  . dialysis replacement fluid (prismasate) 200 mL/hr at 07/20/14 1632  . dialysate (PRISMASATE) 1,500 mL/hr at 07/21/14 1155   PRN Meds:.acetaminophen, heparin, heparin, morphine injection, ondansetron (ZOFRAN) IV, promethazine, sodium chloride    BP 122/46 mmHg  Pulse 94  Temp(Src) 97 F (36.1 C) (Oral)  Resp 23  Ht 5\' 6"  (1.676 m)  Wt 65.1 kg (143 lb 8.3 oz)  BMI 23.18 kg/m2  SpO2 98%   PPS: 30   Intake/Output Summary (Last 24 hours) at 07/21/14 1431 Last data filed at 07/21/14 1400  Gross per 24 hour  Intake 1850.84 ml  Output   4345 ml  Net -2494.16 ml    Physical Exam:  General: Alert, NAD HEENT:  Pena, sclera anictric, mmm Psych: attempts to be stoic, but tearful at times  Labs: CBC    Component Value Date/Time   WBC 11.1* 07/21/2014 0500   WBC 8.5 04/14/2010 0958   RBC 3.62* 07/21/2014 0500   RBC 4.67 04/14/2010 0958   HGB 9.7* 07/21/2014 0500   HGB 13.9 04/14/2010 0958   HCT 29.5* 07/21/2014 0500   HCT 39.9 04/14/2010 0958   PLT 145*  07/21/2014 0500   PLT 215 04/14/2010 0958   MCV 81.5 07/21/2014 0500   MCV 85.5 04/14/2010 0958   MCH 26.8 07/21/2014 0500   MCH 28.4 12/11/2009 1332   MCHC 32.9 07/21/2014 0500   MCHC 34.9 04/14/2010 0958   RDW 14.3 07/21/2014 0500   RDW 14.0 04/14/2010 0958   LYMPHSABS 1.5 07/19/2014 1500   LYMPHSABS 1.3 04/14/2010 0958   MONOABS 1.4* 07/19/2014 1500   MONOABS 0.5 04/14/2010 0958   EOSABS 0.0 07/19/2014 1500   EOSABS 0.1 04/14/2010 0958   BASOSABS 0.0 07/19/2014 1500   BASOSABS 0.0 04/14/2010 0958    BMET    Component Value Date/Time   NA 131* 07/21/2014 0500   K 3.9 07/21/2014 0500   CL 97 07/21/2014 0500   CO2 25 07/21/2014 0500   GLUCOSE 153* 07/21/2014 0500   BUN 16 07/21/2014 0500   CREATININE 2.13* 07/21/2014 0500   CALCIUM 7.6* 07/21/2014 0500   GFRNONAA 32* 07/21/2014 0500   GFRAA 37* 07/21/2014 0500    CMP     Component Value Date/Time   NA 131* 07/21/2014 0500   K 3.9 07/21/2014 0500   CL 97 07/21/2014 0500   CO2 25 07/21/2014 0500   GLUCOSE 153* 07/21/2014 0500   BUN 16 07/21/2014 0500   CREATININE 2.13* 07/21/2014 0500   CALCIUM 7.6* 07/21/2014 0500   PROT 6.5 07/11/2014 0258   ALBUMIN 2.4* 07/21/2014 0500   AST 19 07/11/2014 0258   ALT 13 07/11/2014 0258   ALKPHOS 98 07/11/2014 0258   BILITOT 0.9 07/11/2014 0258   GFRNONAA 32* 07/21/2014 0500   GFRAA 37* 07/21/2014 0500   1/21 CXR IMPRESSION: New left internal jugular central line with tip in expected location of the low SVC. No pneumothorax.  Bilateral moderate pleural effusions. Left base consolidation.  1/20 Renal US IMPRESSION: No evidence for hydronephrosis.  Large bilateral pleural effusions.  1/14 CT Chest IMPRESSION: 1. Bilateral pleural effusions which are moderate in volume with associated passive atelectasis. No significant change from comparison exam. 2. No overt pulmonary edema. 3. Coronary calcifications.   Total Time: 40 minutes Greater than 50%  of this  time was spent counseling and coordinating care related to the above assessment and plan.   Doran Clay D.O. Palliative Medicine  Team at [facility]  Pager: [phone] Team Phone: [phone]

## 2014-07-21 NOTE — Progress Notes (Signed)
Pt still has multiple questions about palliative process after termination ofCRRT. Rn spent time throughtout day answering his questions. Code status not addressed by palliative. Pt's sister at bedside and will plan to see MD's on rounds in am to discuss further care. Pt and family voice their understanding of decisions to be made possibly tomorrow. Comfort and emotional support given to patient and family throughout the day by RN. Pt has been saying his goodbye's to his family and this RN today. Pt has asked that I not bother him with drawing labs this evening (renal panel), and checking his CBG. He says "I mean what's the point"?Rn honored pt wishes. Continue to monitor and support patient emotionally .  Ellamae Sia

## 2014-07-21 NOTE — Progress Notes (Addendum)
ANTICOAGULATION CONSULT NOTE - Follow Up Consult  Pharmacy Consult for Heparin Indication: atrial fibrillation  No Known Allergies  Patient Measurements: Height: 5\' 6"  (167.6 cm) Weight: 143 lb 8.3 oz (65.1 kg) IBW/kg (Calculated) : 63.8  Vital Signs: Temp: 97.6 F (36.4 C) (01/24 0800) Temp Source: Oral (01/24 0800) BP: 109/36 mmHg (01/24 1200) Pulse Rate: 93 (01/24 1200)  Labs:  Recent Labs  07/19/14 0420  07/19/14 1500 07/20/14 0500  07/20/14 1635 07/20/14 2100 07/21/14 0500 07/21/14 1158  HGB 10.5*  --  10.3* 9.9*  --   --   --  9.7*  --   HCT 31.0*  --  30.0* 29.6*  --   --   --  29.5*  --   PLT 138*  --  172 168  --   --   --  145*  --   APTT >200*  --   --  71*  --   --   --  129*  --   HEPARINUNFRC  --   < >  --  0.10*  < >  --  0.17* 0.32 0.20*  CREATININE 3.75*  --  2.96* 2.72*  --  2.20*  --  2.13*  --   < > = values in this interval not displayed.  Estimated Creatinine Clearance: 32.4 mL/min (by C-G formula based on Cr of 2.13).   Medications:  Scheduled:  . antiseptic oral rinse  15 mL Mouth Rinse BID  . aspirin EC  81 mg Oral Daily  . atorvastatin  80 mg Oral q1800  . insulin aspart  0-9 Units Subcutaneous TID WC  . isosorbide mononitrate  30 mg Oral Daily  . linagliptin  5 mg Oral Daily  . sodium chloride  10-40 mL Intracatheter Q12H  . sodium phosphate  Dextrose 5% IVPB  10 mmol Intravenous Once  . ticagrelor  90 mg Oral BID   Infusions:  . sodium chloride Stopped (07/17/14 1545)  . sodium chloride Stopped (07/19/14 1228)  . amiodarone 30 mg/hr (07/21/14 1200)  . heparin 1,000 Units/hr (07/21/14 1200)  . milrinone 0.25 mcg/kg/min (07/21/14 1200)  . norepinephrine (LEVOPHED) Adult infusion 3.013 mcg/min (07/21/14 1200)  . dialysis replacement fluid (prismasate) 300 mL/hr at 07/20/14 2209  . dialysis replacement fluid (prismasate) 200 mL/hr at 07/20/14 1632  . dialysate (PRISMASATE) 1,500 mL/hr at 07/21/14 1155    Assessment: 64 yo m  with afib/flutter on heparin. Pt is on CRRT with heparin syringe. Heparin was put on hold 1/22 for HD cath site bleeding, no issues now.  HL this afternoon subtherapeutic at 0.20. Hgb 9.7, plts 145, no bleeding or infusion problems per RN.  Goal of Therapy:  Heparin level 0.3-0.7 units/ml Monitor platelets by anticoagulation protocol: Yes   Plan:  Increase heparin infusion to 1100 units/hr 8-hr HL @ 2130 Monitor hgb/plts, s/s of bleeding, clinical course  Cassie L. Nicole Kindred, PharmD Clinical Pharmacy Resident Pager: [phone] 07/21/2014 1:26 PM   Evening heparin level still low at 0.25. No issues noted per nursing. Patient requested limiting lab draws if possible as CRRT likely to be stopped tomorrow and start transition to comfort care. D/w nurse and she reports patient is a nurse draw and labs are drawn off a central line so it is no large concern. Will increase heparin infusion rate and check level with am labs.  Plan Increase heparin to 1250 units/hr Check labs in am  Erin Hearing PharmD., [facility] Clinical Pharmacist Pager [phone] 07/21/2014 10:17 PM

## 2014-07-21 NOTE — Progress Notes (Addendum)
Patient ID: Tyler Erickson, male   DOB: Apr 28, 1951, 64 y.o.   MRN: [id number] Advanced Heart Failure Rounding Note   Subjective:    Started on CVVHD 1/21. Removing 100cc/hr today. CVP down to 10. Co-ox 47.5%, now off norepinephrine.  BP soft.   He had a run of VT last night that was self-terminated but passed out briefly.   Feels weak. Breathing ok, no chest pain.  Anuric. Denies CP.   Objective:   Weight Range:  Vital Signs:   Temp:  [97.5 F (36.4 C)-97.9 F (36.6 C)] 97.5 F (36.4 C) (01/24 0500) Pulse Rate:  [87-103] 87 (01/24 0700) Resp:  [17-31] 22 (01/24 0700) BP: (89-139)/(12-48) 121/31 mmHg (01/24 0700) SpO2:  [93 %-98 %] 97 % (01/24 0700) Weight:  [143 lb 8.3 oz (65.1 kg)] 143 lb 8.3 oz (65.1 kg) (01/24 0500) Last BM Date: 07/20/14  Weight change: Filed Weights   07/19/14 0500 07/20/14 0500 07/21/14 0500  Weight: 149 lb 11.1 oz (67.9 kg) 148 lb 2.4 oz (67.2 kg) 143 lb 8.3 oz (65.1 kg)    Intake/Output:   Intake/Output Summary (Last 24 hours) at 07/21/14 0815 Last data filed at 07/21/14 0700  Gross per 24 hour  Intake 1466.05 ml  Output   4246 ml  Net -2779.95 ml     Physical Exam: General: Pale. Chronically ill appearing. No dyspnea. Lying in bed.  HEENT: normal x for poor dentition Neck: supple. RIJ swan sheath. LI trialysis. JVP 8-9 cm.  Carotids 2+ bilat; no bruits. No lymphadenopathy or thryomegaly appreciated. Cor: PMI nondisplaced. Regular tachy + PVCs. + 2/6 MR Lungs: decreased throughout.  Abdomen: soft, nontender, nondistended. No hepatosplenomegaly. No bruits or masses. Sluggish bowel sounds. Extremities: no cyanosis, clubbing, rash, 1+ edema on R. + L BKA  Neuro:slightly lethargic cranial nerves grossly intact. moves all 4 extremities w/o difficulty. Affect pleasant.  Telemetry: NSR 90s with PVCs, had run of VT last night.   Labs: Basic Metabolic Panel:  Recent Labs Lab 06/30/2014 0234  07/19/14 0420 07/19/14 1500 07/20/14 0500  07/20/14 1635 07/21/14 0500  NA 131*  < > 134* 133* 133* 132* 131*  K 3.8  < > 4.0 4.0 4.1 4.0 3.9  CL 98  < > 99 98 98 98 97  CO2 23  < > 21 25 25 24 25   GLUCOSE 132*  < > 111* 139* 161* 197* 153*  BUN 47*  < > 43* 29* 22 18 16   CREATININE 2.46*  < > 3.75* 2.96* 2.72* 2.20* 2.13*  CALCIUM 8.0*  < > 7.8* 7.7* 7.5* 7.6* 7.6*  MG 2.0  --  2.2  --  2.4 2.5 2.6*  PHOS  --   < > 3.6 3.0 2.7 2.3 2.0*  < > = values in this interval not displayed.  Liver Function Tests:  Recent Labs Lab 07/19/14 0420 07/19/14 1500 07/20/14 0500 07/20/14 1635 07/21/14 0500  ALBUMIN 2.7* 2.6* 2.5* 2.5* 2.4*   No results for input(s): LIPASE, AMYLASE in the last 168 hours. No results for input(s): AMMONIA in the last 168 hours.  CBC:  Recent Labs Lab 07/18/14 0220 07/19/14 0420 07/19/14 1500 07/20/14 0500 07/21/14 0500  WBC 12.0* 10.2 13.5* 12.0* 11.1*  NEUTROABS  --   --  10.6*  --   --   HGB 11.5* 10.5* 10.3* 9.9* 9.7*  HCT 33.6* 31.0* 30.0* 29.6* 29.5*  MCV 81.0 79.1 79.4 80.9 81.5  PLT 149* 138* 172 168 145*    Cardiac Enzymes:  No results for input(s): CKTOTAL, CKMB, CKMBINDEX, TROPONINI in the last 168 hours.  BNP: BNP (last 3 results) No results for input(s): PROBNP in the last 8760 hours.   Other results:    Imaging: No results found.   Medications:     Scheduled Medications: . antiseptic oral rinse  15 mL Mouth Rinse BID  . aspirin EC  81 mg Oral Daily  . atorvastatin  80 mg Oral q1800  . insulin aspart  0-9 Units Subcutaneous TID WC  . isosorbide mononitrate  30 mg Oral Daily  . linagliptin  5 mg Oral Daily  . sodium chloride  10-40 mL Intracatheter Q12H  . ticagrelor  90 mg Oral BID    Infusions: . sodium chloride Stopped (07/17/14 1545)  . sodium chloride Stopped (07/19/14 1228)  . amiodarone 30 mg/hr (07/21/14 0700)  . heparin 1,000 Units/hr (07/21/14 0700)  . milrinone 0.25 mcg/kg/min (07/21/14 0700)  . norepinephrine (LEVOPHED) Adult infusion  Stopped (07/20/14 1600)  . dialysis replacement fluid (prismasate) 300 mL/hr at 07/20/14 2209  . dialysis replacement fluid (prismasate) 200 mL/hr at 07/20/14 1632  . dialysate (PRISMASATE) 1,500 mL/hr at 07/21/14 0509    PRN Medications: acetaminophen, heparin, heparin, morphine injection, ondansetron (ZOFRAN) IV, promethazine, sodium chloride   Assessment:   1. Acute systolic heart failure due to iCM EF 20% -> cardiogenic shock 2. 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 DM2 4. PAD s/p L BKA. Occluded bilateral axillary arteries 5. Acute renal failure. CVVHD started on 1/21 6. Severe protein-calorie malnutrition pre-albumin 13.9 7. VT 8. Hyperkalemia 9. Colon CA s/p colectomy 2009 and adjuvant chemo 10. Atrial flutter/a tach -> sinus rhythm on amio 11. Bilateral Pleural Effusions.  12. Hyponatremia  Plan/Discussion:    Now on CVVHD. Remains anuric. Good volume removal yesterday, CVP down to 10.  Co-ox lower today after titrating off norepinephrine (47.5%).  BP soft.  - Restart norepinephrine at 3 and repeat co-ox this afternoon.  - Will lower UF rate a bit today, CVP is improved.   Continue amiodarone for AF/VT. VT episode 1/24 self-terminated.  Heparin has been resumed, hemoglobin mildly lower.  Continue to follow closely.    Continue ASA/Brilinta/statin.  No b-blocker/ACE due to shock and renal failure.   Prognosis increasingly poor. Will need high-risk PCI LM/LAD/LCX if recovers but remains anuric and on pressor/inotrope.  He would not be a candidate for long-term hemodialysis.  Will see how he does through the day today but palliative care/hospice seems to be becoming our best option.  We talked about this today and I will have the palliative medicine service see him today.  Would be appropriate to move to comfort care if no improvement today.   Loralie Champagne 07/21/2014 8:15 AM

## 2014-07-21 NOTE — Progress Notes (Signed)
[facility] Rounding Note Subjective:   Tolerating CRRT/no heparin neg 100/hr (Dr. Aundra Dubin has backed of to 75/hour this AM) Remains totally anuric Off norepi but with plans to restart at lower dose due to drop in co-ox. Dr. Aundra Dubin is calling Palliative in to see as pt's prognosis increasingly grim Tyler Erickson very clearly understands this He is asking how long he would last without CRRT I told him if CRRT and his inotropes/pressors were all withdrawn might only be a couple of days - he understands this and his preference would be to die at home where he lives with his brother  Objective Vital signs in last 24 hours: Filed Vitals:   07/21/14 0500 07/21/14 0600 07/21/14 0700 07/21/14 0800  BP: 108/36 112/36 121/31 100/32  Pulse: 87 87 87 87  Temp: 97.5 F (36.4 C)   97.6 F (36.4 C)  TempSrc: Oral   Oral  Resp: 24 19 22 24   Height:      Weight: 65.1 kg (143 lb 8.3 oz)     SpO2: 98% 96% 97% 95%   Physical Exam:  BP 100/32 mmHg  Pulse 87  Temp(Src) 97.6 F (36.4 C) (Oral)  Resp 24  Ht 5\' 6"  (1.676 m)  Wt 65.1 kg (143 lb 8.3 oz)  BMI 23.18 kg/m2  SpO2 95%  CVP 11 Gen: Pale appearing WM. More engaging today than he has been all week. VS as noted - BP cuff on right leg Skin: with scarring right arm from previous infection (remote) Line in right neck (IJ swan sheath) and trialysis catheter in left neck with dry dressing Chest: Ant fairly clear but BS diminished Heart: Tachy S1S2 2/6 murmur systolic no diastolic Abdomen: soft, scars from previous colon surgery. Not distended Ext: Reduced edema in right leg and left stump  Weights: 1/16 64.1 kg 1/18 66.1 kg 1/20 68.2 kg 1/21 68.8 kg  CRRT started 1/22 67.9 kg 1/23 67.2 1/24 65.1 kg  Labs:  Recent Labs Lab 07/18/14 0202 07/18/14 1600 07/19/14 0420 07/19/14 1500 07/20/14 0500 07/20/14 1635 07/21/14 0500  NA 128* 129* 134* 133* 133* 132* 131*  K 3.5 3.9 4.0 4.0 4.1 4.0 3.9  CL 95* 96 99 98 98 98 97   CO2 18* 18* 21 25 25 24 25   GLUCOSE 154* 147* 111* 139* 161* 197* 153*  BUN 65* 68* 43* 29* 22 18 16   CREATININE 4.78* 5.19* 3.75* 2.96* 2.72* 2.20* 2.13*  CALCIUM 8.0* 7.8* 7.8* 7.7* 7.5* 7.6* 7.6*  PHOS  --  5.8* 3.6 3.0 2.7 2.3 2.0*    Recent Labs Lab 07/20/14 0500 07/20/14 1635 07/21/14 0500  ALBUMIN 2.5* 2.5* 2.4*    Recent Labs Lab 07/19/14 0420 07/19/14 1500 07/20/14 0500 07/21/14 0500  WBC 10.2 13.5* 12.0* 11.1*  NEUTROABS  --  10.6*  --   --   HGB 10.5* 10.3* 9.9* 9.7*  HCT 31.0* 30.0* 29.6* 29.5*  MCV 79.1 79.4 80.9 81.5  PLT 138* 172 168 145*    Recent Labs Lab 07/19/14 2232 07/20/14 0821 07/20/14 1208 07/20/14 1738 07/20/14 2204  GLUCAP 135* <10* 185* 166* 178*   Studies/Results: No results found. Medications: . sodium chloride Stopped (07/17/14 1545)  . sodium chloride Stopped (07/19/14 1228)  . amiodarone 30 mg/hr (07/21/14 0800)  . heparin 1,000 Units/hr (07/21/14 0800)  . milrinone 0.25 mcg/kg/min (07/21/14 0800)  . norepinephrine (LEVOPHED) Adult infusion Stopped (07/20/14 1600)  . dialysis replacement fluid (prismasate) 300 mL/hr at 07/20/14 2209  . dialysis replacement  fluid (prismasate) 200 mL/hr at 07/20/14 1632  . dialysate (PRISMASATE) 1,500 mL/hr at 07/21/14 0836   . antiseptic oral rinse  15 mL Mouth Rinse BID  . aspirin EC  81 mg Oral Daily  . atorvastatin  80 mg Oral q1800  . insulin aspart  0-9 Units Subcutaneous TID WC  . isosorbide mononitrate  30 mg Oral Daily  . linagliptin  5 mg Oral Daily  . sodium chloride  10-40 mL Intracatheter Q12H  . ticagrelor  90 mg Oral BID    I  have reviewed scheduled and prn medications.  Background 64 yo WM with DM, severe ASVD with left main and 3vessel CAD, systolic heart failure with low EF (20-25) who has developed symptomatic oliguric AKI with mild metabolic acidosis in the setting of IV contrast (cath 1/13) and cardiorenal syndrome/cardiogenic shock., severe ASVD with left main and 3vessel CAD, systolic heart failure with low EF (20-25) who has developed symptomatic oliguric AKI with mild metabolic acidosis in the setting of IV contrast (cath 1/13) and cardiorenal syndrome/cardiogenic shock. (Baseline CKD 3 with creatinine  prior to cath 1.93). Requiring high dose inotropes/pressor support and in need of another contrast procedure at some point for his left main disease. Asked to see for provision of CRRT.  AKI on CKD3/4 - contrast (cath) + cardiorenal syndrome from low EF. (Baseline CKD likely DM/nephrosclerosis - most urines over past 7 years with dipstick proteinuria).  Remains anuric. Tolerating CRRT , acidosis and azotemia improved, mental status improved and weight down nearly 5 kg from max weight in the hospital.  K stable, phos continues to trend down - will give 1 dose replacement today 10 mmoles Fluid removal changed to neg 75/hour by Dr. Aundra Dubin this AM.  Would be not be candidate for long term HD if there is no renal recovery (severe cardiomyopathy, low BP, no upper extremity vascular access targets) and the likelihood of renal recovery is low .  Ischemic cardiomyopathy/EF 20-%  with LM and 3V disease, low EF On milronine/norep for cardiogenic shock Needs high risk PCI of left main/LAD andCx at some point - Dr. Aundra Dubin no optimistic that he can be well enough for that. Overall prognosis not clear but looks rather grim.   AF/vtach -  Heparin/amio.Had self-terminating episode of VT 1/23  DM - per primary service  PAD upper extremities (right axillary and left brachial artery occlusions) - limited to taking BP in right leg - certainly not optimal.  Dr. Aundra Dubin discussed prognosis with him earlier today and I followed up that discussion, reiterating that his cardiac status precluded long tern dialysis, that the likelihood of renal function recovery is very low, and discussed consequences/timeline if all support withdrawn.  He clearly understands and is not pushing for prolonged measures - he just wants his family updated in person.   Jamal Maes, MD [facility] [phone] pager 07/21/2014, 9:31 AM

## 2014-07-22 DIAGNOSIS — I469 Cardiac arrest, cause unspecified: Secondary | ICD-10-CM

## 2014-07-22 MED ORDER — SODIUM CHLORIDE 0.9 % IV SOLN
2.0000 mg/h | INTRAVENOUS | Status: DC
  Administered 2014-07-22: 2 mg/h via INTRAVENOUS
  Filled 2014-07-22: qty 10

## 2014-07-22 MED ORDER — MORPHINE SULFATE 2 MG/ML IJ SOLN: 1.0000 mg | INTRAMUSCULAR | Status: DC | PRN

## 2014-07-22 MED ORDER — ATROPINE SULFATE 1 % OP SOLN
2.0000 [drp] | OPHTHALMIC | Status: DC | PRN
  Administered 2014-07-22: 2 [drp] via SUBLINGUAL
  Filled 2014-07-22: qty 2

## 2014-07-22 MED ORDER — MORPHINE SULFATE 2 MG/ML IJ SOLN
INTRAMUSCULAR | Status: AC
  Filled 2014-07-22: qty 1

## 2014-07-22 MED ORDER — MORPHINE SULFATE 2 MG/ML IJ SOLN
1.0000 mg | INTRAMUSCULAR | Status: DC | PRN
  Administered 2014-07-22 (×2): 2 mg via INTRAVENOUS
  Filled 2014-07-22 (×2): qty 1

## 2014-07-22 MED ORDER — DEXTROSE 5 % IV SOLN
5.0000 mg/h | INTRAVENOUS | Status: DC
  Administered 2014-07-22: 5 mg/h via INTRAVENOUS
  Filled 2014-07-22: qty 10

## 2014-07-22 MED ORDER — MORPHINE SULFATE 4 MG/ML IJ SOLN: 4.0000 mg | INTRAMUSCULAR | Status: DC | PRN

## 2014-07-22 MED FILL — Medication: Qty: 1 | Status: AC

## 2014-07-29 NOTE — Progress Notes (Signed)
Body being prepared to take down to [place]. Eyes have been prepared with NS and covered with eye pads soaked in NS. Family left earlier, emotional support given

## 2014-07-29 NOTE — Progress Notes (Signed)
Patient developed polymorphic VT and had CPR, defib with epinephrine, magnesium and bicarb.  When I arrived patient had ROSC and was asking "Why did they not just let me die?"  Chart reviewed and prior palliative care discussions, Dr. Aundra Dubin note and Dr. Lorrene Reid note read. CRRT was terminated at start of arrest. Discussed with patient and he does not wish CPR or further life prolonging efforts and wishes comfort care.  Sister Diane updated and in agreement.   No Code Blue status now.    Kerry Hough MD [facility]

## 2014-07-29 NOTE — Progress Notes (Signed)
200 ml of Morphine and 20 ml of Versed wasted in sink witness by Allena Katz RN and Paula Compton, Therapist, sports. [place], Ardeth Sportsman

## 2014-07-29 NOTE — Progress Notes (Signed)
Have reviewed events, plan and discussed with patient.  Will s/o and see again at your request.

## 2014-07-29 NOTE — Progress Notes (Addendum)
Patient asystole on monitor no heart tones auscultated by 2 RN's-Alaiyah Bollman [facility] and [facility] @ 18:35. Dr. Sung Amabile notified.  Emotional support given to family. [place], Ardeth Sportsman

## 2014-07-29 NOTE — Code Documentation (Cosign Needed)
CODE BLUE NOTE  Patient Name: Tyler Erickson   MRN: [id number]   Date of Birth/ Sex: [birth year] , male      Admission Date: 07/21/2014  Attending Provider: Jacolyn Reedy, MD  Primary Diagnosis: Cardiogenic shock    Indication: Pt was in his usual state of health until this AM, when he was noted to be V. Fib arrest. Code blue was subsequently called. At the time of arrival on scene, ACLS protocol was underway.  Unsure of what patient's wishes for code status was after discussions yesterday about end of life care. Subsequently full code was underway. However, patient was resuscitated and asked "why didn't they just let me die".     Technical Description:  - CPR performance duration:  10 minutes  - Was defibrillation or cardioversion used? No   - Was external pacer placed? No  - Was patient intubated pre/post CPR? No    Medications Administered: Y = Yes; Blank = No Amiodarone    Atropine    Calcium    Epinephrine  X  Lidocaine    Magnesium  X  Norepinephrine    Phenylephrine    Sodium bicarbonate  X  Vasopressin      Post CPR evaluation:  - Final Status - Was patient successfully resuscitated ? Yes - What is current rhythm? A. fib - What is current hemodynamic status? Stable   Miscellaneous Information:  - Labs sent, including: None  - Primary team notified?  Yes  - Family Notified? No  - Additional notes/ transfer status:  Already on 2H     Physicians Responding to Code Blue: Clayburn Pert, Internal Medicine PGY-3 Luiz Blare, DO, Family Medicine PGY-1

## 2014-07-29 NOTE — Progress Notes (Signed)
Patient developed rapid polymorphic VT with emergent CPR given followed by rapid defibrillation. Code Blue was called at the beginning of the code with team responded immediately. A total of 3 shocks was given along with bicarb and magnesium. Epinephrine was called for yet never given. E-link MD Doctor Deterding with assistant of code even with code team in room. Doctor Wynonia Lawman- cardiologist called concerning patient with Dr.Tilley promptly coming to bedside of patient. Total of 6 minutes of compressions, defibrillation, and medications took place with return of sinus rhythm with pulse and spontaneous respirations. Patient awake, alert and oriented. Patient stated "why didn't you just let me go". Emotional support given to patient. Family called in, two sisters. Patient doesn't want any more heroic measures, H/D, labs drawn and blood pressures taken. When asked if he had any pain, he complained of skeletal thoracic pain from CPR. Morphine 2 mg IV given. Family now at bedside.

## 2014-07-29 NOTE — Progress Notes (Signed)
Chaplain services offered to family; family declined.  Will continue to update and support family & patient as needed. Tyler Erickson, Tyler Erickson

## 2014-07-29 NOTE — Progress Notes (Addendum)
Patient ID: Tyler Erickson, male   DOB: 09-25-1950, 64 y.o.   MRN: [id number] Advanced Heart Failure Rounding Note   Subjective:    Events of last night noted.  Coded last night. CRRT stopped. Had VT requiring 3 shocks bicarb and magnesium. Last night he requested DNR.     Co-ox 64%, remains on norepi 3 and milrinone 0.25  Continues to cough and be dyspneic.     Objective:   Weight Range:  Vital Signs:   Temp:  [97 F (36.1 C)-97.8 F (36.6 C)] 97.8 F (36.6 C) (01/25 0000) Pulse Rate:  [78-137] 91 (01/25 0500) Resp:  [17-61] 17 (01/25 0500) BP: (87-186)/(20-118) 147/24 mmHg (01/25 0230) SpO2:  [85 %-100 %] 98 % (01/25 0500) Last BM Date: 07/21/14  Weight change: Filed Weights   07/19/14 0500 07/20/14 0500 07/21/14 0500  Weight: 149 lb 11.1 oz (67.9 kg) 148 lb 2.4 oz (67.2 kg) 143 lb 8.3 oz (65.1 kg)    Intake/Output:   Intake/Output Summary (Last 24 hours) at 08/17/14 0710 Last data filed at 08/17/2014 0500  Gross per 24 hour  Intake 2445.29 ml  Output   3650 ml  Net -1204.71 ml     Physical Exam: General: Pale. Chronically ill appearing. No dyspnea. Lying in bed.  HEENT: normal x for poor dentition Neck: supple. RIJ swan sheath Cor: PMI nondisplaced. Regular tachy + PVCs. + 2/6 MR Lungs: decreased throughout.  Abdomen: soft, nontender, nondistended. No hepatosplenomegaly. No bruits or masses. Sluggish bowel sounds. Extremities: no cyanosis, clubbing, rash, 1+ edema on R. + L BKA  Neuro:slightly lethargic cranial nerves grossly intact. moves all 4 extremities w/o difficulty. Affect pleasant.  Telemetry: NSR 90s with PVCs, VT over night.    Labs: Basic Metabolic Panel:  Recent Labs Lab 07/19/14 0420 07/19/14 1500 07/20/14 0500 07/20/14 1635 07/21/14 0500  NA 134* 133* 133* 132* 131*  K 4.0 4.0 4.1 4.0 3.9  CL 99 98 98 98 97  CO2 21 25 25 24 25   GLUCOSE 111* 139* 161* 197* 153*  BUN 43* 29* 22 18 16   CREATININE 3.75* 2.96* 2.72* 2.20* 2.13*   CALCIUM 7.8* 7.7* 7.5* 7.6* 7.6*  MG 2.2  --  2.4 2.5 2.6*  PHOS 3.6 3.0 2.7 2.3 2.0*    Liver Function Tests:  Recent Labs Lab 07/19/14 0420 07/19/14 1500 07/20/14 0500 07/20/14 1635 07/21/14 0500  ALBUMIN 2.7* 2.6* 2.5* 2.5* 2.4*   No results for input(s): LIPASE, AMYLASE in the last 168 hours. No results for input(s): AMMONIA in the last 168 hours.  CBC:  Recent Labs Lab 07/18/14 0220 07/19/14 0420 07/19/14 1500 07/20/14 0500 07/21/14 0500  WBC 12.0* 10.2 13.5* 12.0* 11.1*  NEUTROABS  --   --  10.6*  --   --   HGB 11.5* 10.5* 10.3* 9.9* 9.7*  HCT 33.6* 31.0* 30.0* 29.6* 29.5*  MCV 81.0 79.1 79.4 80.9 81.5  PLT 149* 138* 172 168 145*    Cardiac Enzymes: No results for input(s): CKTOTAL, CKMB, CKMBINDEX, TROPONINI in the last 168 hours.  BNP: BNP (last 3 results) No results for input(s): PROBNP in the last 8760 hours.   Other results:    Imaging: No results found.   Medications:     Scheduled Medications: . antiseptic oral rinse  15 mL Mouth Rinse BID  . aspirin EC  81 mg Oral Daily  . atorvastatin  80 mg Oral q1800  . insulin aspart  0-9 Units Subcutaneous TID WC  . isosorbide mononitrate  30 mg Oral Daily  . linagliptin  5 mg Oral Daily  . morphine      . sodium chloride  10-40 mL Intracatheter Q12H  . ticagrelor  90 mg Oral BID    Infusions: . sodium chloride Stopped (07/17/14 1545)  . sodium chloride Stopped (07/19/14 1228)  . amiodarone 30 mg/hr (07/21/14 2218)  . milrinone 0.25 mcg/kg/min (07/21/14 1900)  . norepinephrine (LEVOPHED) Adult infusion 3 mcg/min (07/25/2014 0508)    PRN Medications: acetaminophen, morphine injection, ondansetron (ZOFRAN) IV, promethazine, sodium chloride   Assessment:   1. Acute systolic heart failure due to iCM EF 20% -> cardiogenic shock 2. 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 DM2 4. PAD s/p L BKA. Occluded bilateral axillary arteries 5. Acute renal failure. CVVHD started on 1/21 6.  Severe protein-calorie malnutrition pre-albumin 13.9 7. VT--> CPR shocked x3  8. Hyperkalemia 9. Colon CA s/p colectomy 2009 and adjuvant chemo 10. Atrial flutter/a tach -> sinus rhythm on amio 11. Bilateral Pleural Effusions.  12. Hyponatremia 13. Cardiac arrest 1/24 14. DNR    Plan/Discussion:    CVVHD stopped last night after polymorphic VT overnight requiring CPR and 3 shocks. Now DNR.    Plans to meet with palliative care today  .CLEGG,AMY NP-C  July 25, 2014 7:10 AM  Patient seen and examined with Darrick Grinder, NP. We discussed all aspects of the encounter. I agree with the assessment and plan as stated above.   Long talk with patient and family about situation and he is clear that he does not want any further heroic treatments. Has been made DNR. We discussed the diminishing possibility of renal recovery and the fact that he would still require high-risk PCI even if his kidneys recovered and would likely have recurrent renal failure. We also discussed options of home hospice vs [facility] vs inpatient hospice. He is leaning toward the latter. Would like to discuss with family first. We will revisit the conversation later this morning.   The patient is critically ill with multiple organ systems failure and requires high complexity decision making for assessment and support, frequent evaluation and titration of therapies, application of advanced monitoring technologies and extensive interpretation of multiple databases.   Critical Care Time devoted to patient care services described in this note is 35 Minutes.  Daniel Bensimhon,MD 7:46 AM   Addendum: Patient continues to be very dyspneic and coughing. HE has chosen to proceed with Comfort Care. He is frustrated that he was revived with CPR last night.   Will stop inotropes. Start morphine and versed. Full DNR.   Daniel Bensimhon,MD 8:10 AM

## 2014-07-29 DEATH — deceased

## 2014-07-30 NOTE — Discharge Summary (Signed)
Advanced Heart Failure Team  Discharge Summary   Patient ID: JAIDIN RICHISON MRN: [id number], DOB/AGE: 63-Apr-1952 63 y.o. Admit date: 07/05/2014 D/C date:     07/25/2014   Primary Discharge Diagnoses:  1. Cardiogenic shock 2. Acute systolic HF 3. Coronary artery disease with severe ostial LM disease 4. Acute on chronic renal failure 5. Diabetes 6. Severe protein-calorie malnutrition pre-albumin 13.9 7. Ventricular tachycardia 8. Atrial flutter/a tach -> sinus rhythm on amio 9. PAD s/p L BKA. Occluded bilateral axillary arteries   Hospital Course:   Mr. Colin was a 64 year old male with CKD, DM2, PAD s/p L BKA. In 2014 he had a severe prolonged critical illness when he developed wet gangrene of his left foot with sepsis requiring left BKA. He had multisystem failure as well as soft tissue necrosis of each upper extremity requiring plastic surgery. He survived and was able to ambulate with his prosthesis however he fell this past summer and fractured his left hip requiring a hip pinning. His functional status has been compromised since.     He presented to Dr. Thurman Coyer office c/o 1 month h/o progressive dyspnea and CP. Echo with EF 20-25% with moderate MR. ECG with anterior Q waves. Underwent cath by Dr. Wynonia Lawman which showed severe 3v-CAD with high-grade LM disease, 90% proximal to mid LAD lesion, 80% LCX lesion and occluded RCA. He also has occlusion of his right axillary artery and left axillary artery by vascular ultrasound studies. He was seen by Dr. Prescott Gum who felt patient not good candidate for CABG. The advanced HF team was consulted to participate in his care.   Cath films were reviewed with Dr. Burt Knack and it was felt patient would be candidate for high-risk LM, LAD and LCX PCI if he could be stabilized. Unfortunately, Mr. Dunavan developed cardiogenic shock and renal failure post cath. He underwent RHC on 1/15 which confirmed low output HF with cardiac index 1.4. He was started on  inotropic support but his condition continued to deteriorate. Renal was consulted and CVVHD was initiated on 1/21. He remained anuric. On 1/24 he experienced a VT arrest and underwent defibrillation and CPR with successful resuscitation. On 1/25 he was increasingly symptomatic. We met with him and his family and he desired a switch to comfort care. He was started on morphine and versed. He passed later that evening with his family at his bedside.   Treasa School MD 07/30/2014, 3:19 PM

## 2016-01-18 IMAGING — CR DG HIP (WITH OR WITHOUT PELVIS) 2-3V*L*
3 series · 3 of 3 positions shown · non-contrast
Comparison: None.

CLINICAL DATA: Pain post fall 3 weeks ago

EXAM:
LEFT HIP - COMPLETE 2+ VIEW

[view not recorded (1 of 3)]
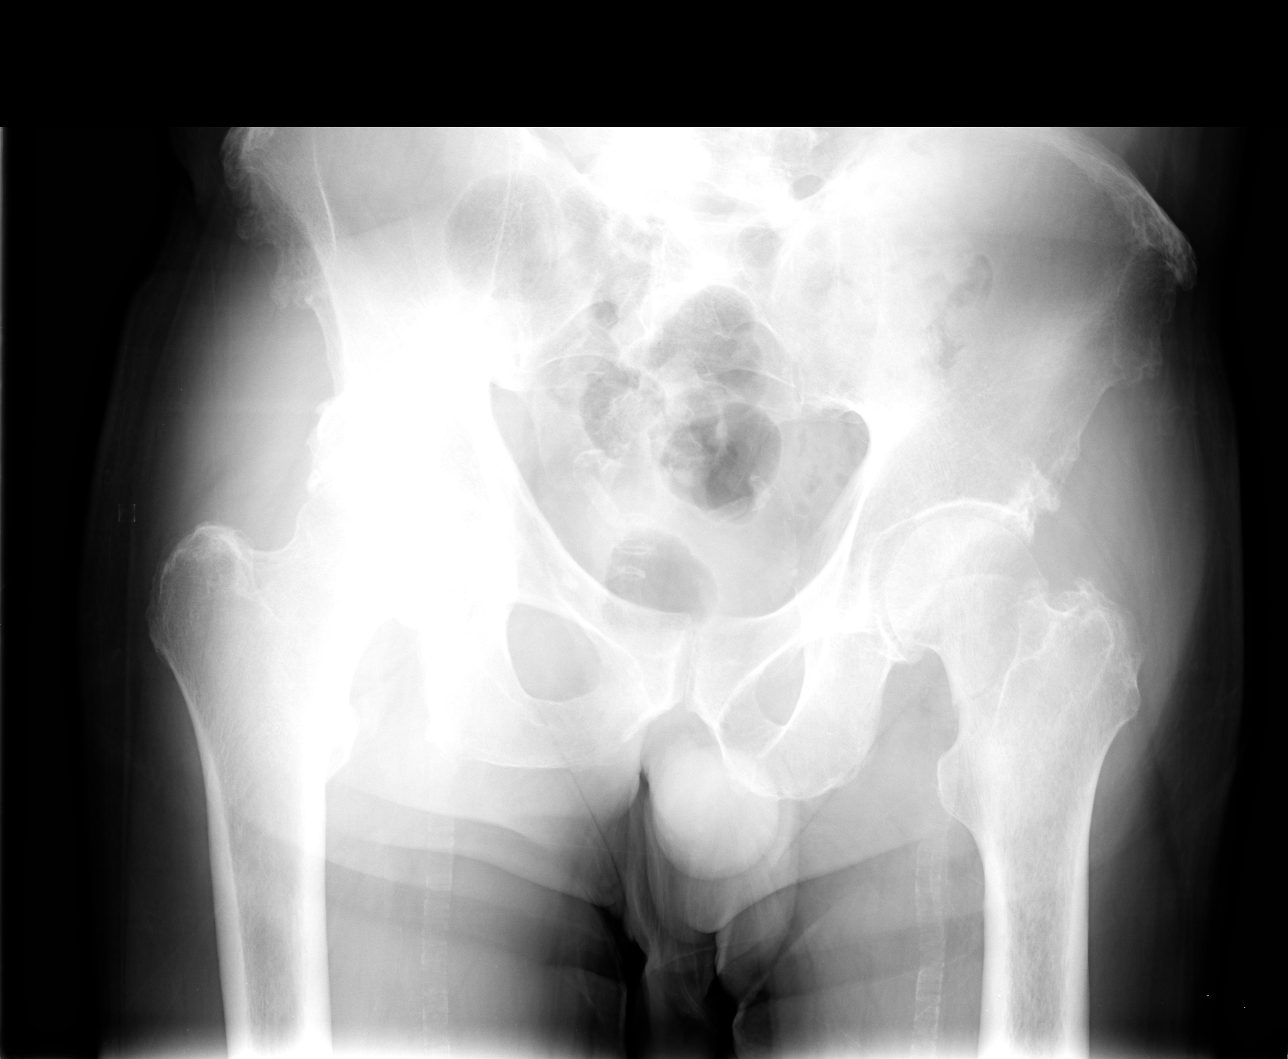

[view not recorded (2 of 3)]
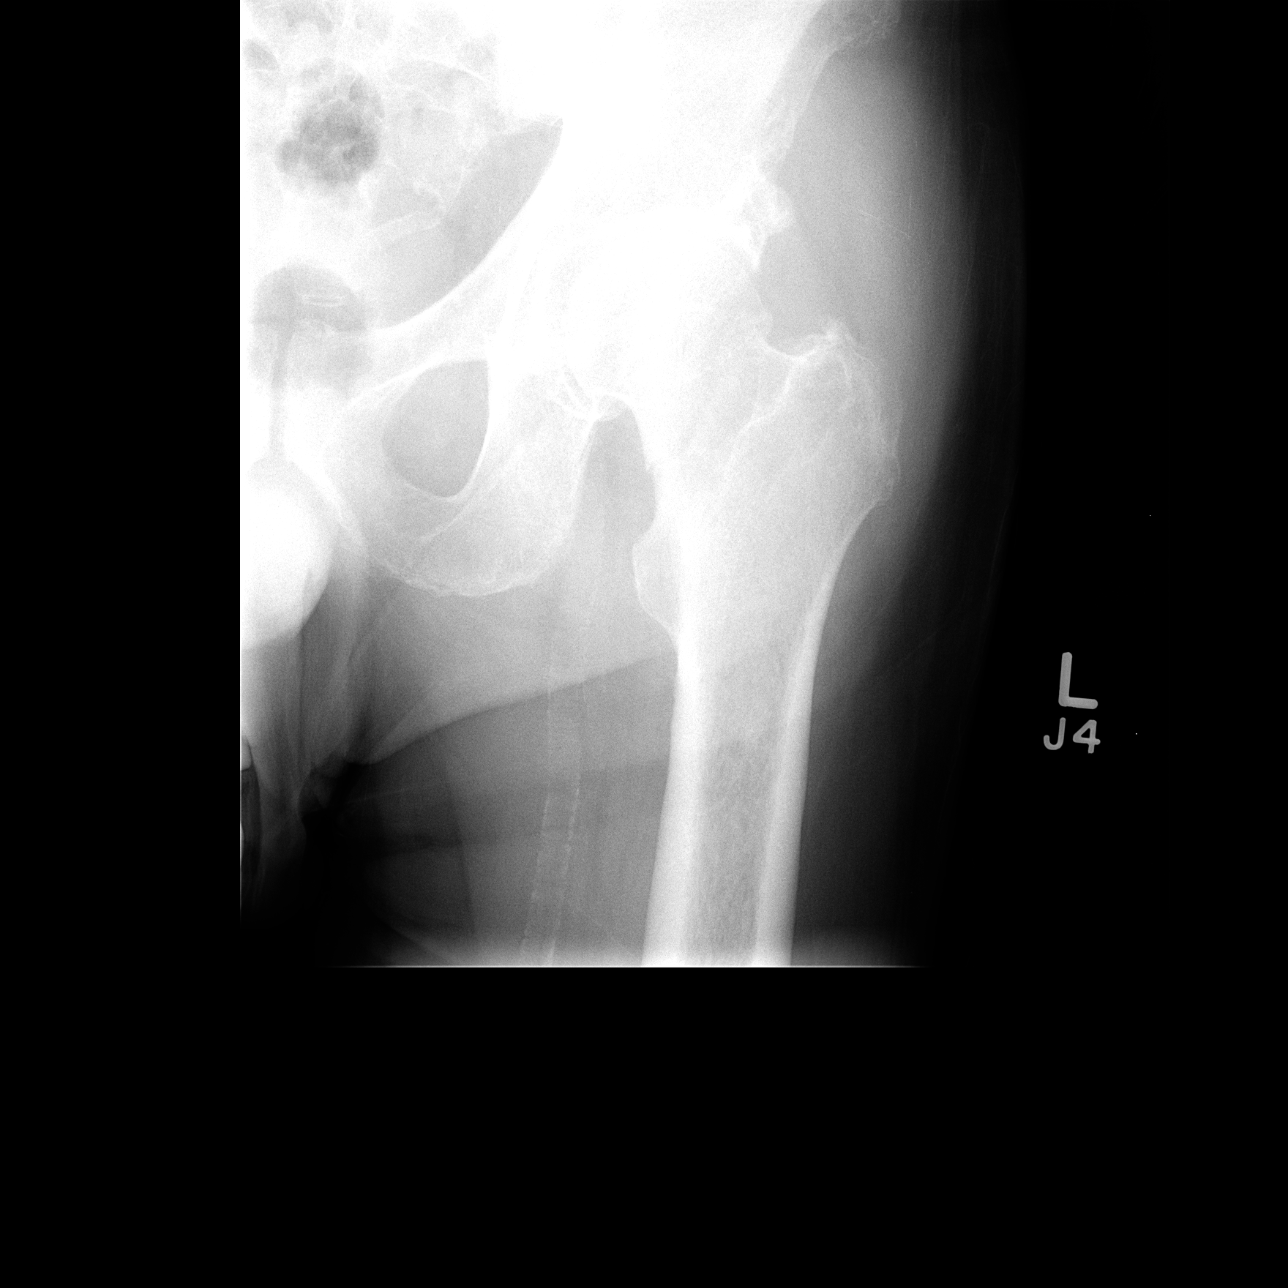

[view not recorded (3 of 3)]
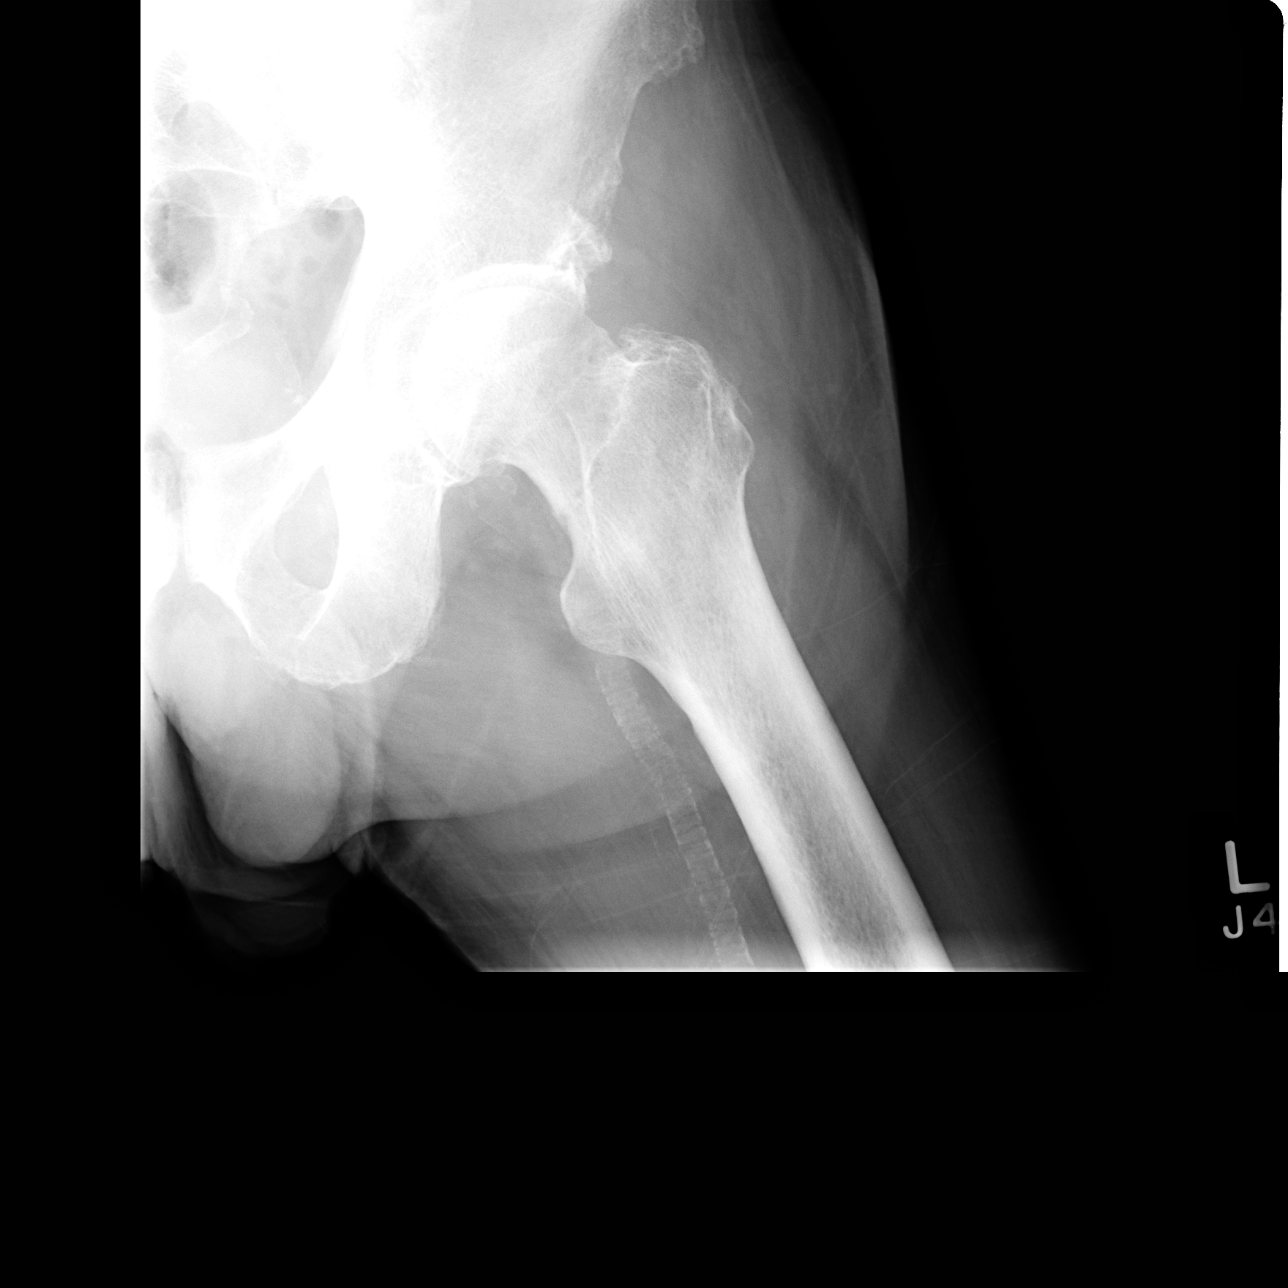

[3 of 3 positions shown; findings below may reference images not displayed]

FINDINGS: Three views of the left hip submitted. There is minimal displaced
fracture of the left femoral neck. Atherosclerotic calcifications of
femoral artery. There is spurring of greater femoral trochanter.
Mild spurring of superior acetabulum.
IMPRESSION: Minimal displaced fracture of the left femoral neck.
